# Patient Record
Sex: Female | Born: 1940 | Race: White | Hispanic: No | Marital: Married | Smoking: Former smoker
Health system: Southern US, Community
[De-identification: ages and names within clinical notes are randomized; demographics above are authoritative.]

## PROBLEM LIST (undated history)

## (undated) DIAGNOSIS — M1990 Unspecified osteoarthritis, unspecified site: Secondary | ICD-10-CM

## (undated) DIAGNOSIS — D126 Benign neoplasm of colon, unspecified: Secondary | ICD-10-CM

## (undated) DIAGNOSIS — K5792 Diverticulitis of intestine, part unspecified, without perforation or abscess without bleeding: Secondary | ICD-10-CM

## (undated) DIAGNOSIS — D649 Anemia, unspecified: Secondary | ICD-10-CM

## (undated) DIAGNOSIS — Z9981 Dependence on supplemental oxygen: Secondary | ICD-10-CM

## (undated) DIAGNOSIS — G4730 Sleep apnea, unspecified: Secondary | ICD-10-CM

## (undated) DIAGNOSIS — R0602 Shortness of breath: Secondary | ICD-10-CM

## (undated) DIAGNOSIS — J449 Chronic obstructive pulmonary disease, unspecified: Secondary | ICD-10-CM

## (undated) DIAGNOSIS — E669 Obesity, unspecified: Secondary | ICD-10-CM

## (undated) DIAGNOSIS — I509 Heart failure, unspecified: Secondary | ICD-10-CM

## (undated) DIAGNOSIS — I10 Essential (primary) hypertension: Secondary | ICD-10-CM

## (undated) DIAGNOSIS — E079 Disorder of thyroid, unspecified: Secondary | ICD-10-CM

## (undated) DIAGNOSIS — A4902 Methicillin resistant Staphylococcus aureus infection, unspecified site: Secondary | ICD-10-CM

## (undated) DIAGNOSIS — L03119 Cellulitis of unspecified part of limb: Secondary | ICD-10-CM

## (undated) DIAGNOSIS — J40 Bronchitis, not specified as acute or chronic: Secondary | ICD-10-CM

## (undated) DIAGNOSIS — K8020 Calculus of gallbladder without cholecystitis without obstruction: Secondary | ICD-10-CM

## (undated) HISTORY — DX: Obesity, unspecified: E66.9

## (undated) HISTORY — DX: Cellulitis of unspecified part of limb: L03.119

## (undated) HISTORY — DX: Methicillin resistant Staphylococcus aureus infection, unspecified site: A49.02

## (undated) HISTORY — DX: Disorder of thyroid, unspecified: E07.9

## (undated) HISTORY — DX: Anemia, unspecified: D64.9

## (undated) HISTORY — DX: Bronchitis, not specified as acute or chronic: J40

## (undated) HISTORY — PX: OTHER SURGICAL HISTORY: SHX169

## (undated) HISTORY — DX: Benign neoplasm of colon, unspecified: D12.6

## (undated) HISTORY — PX: APPLICATION OF WOUND VAC: SHX5189

## (undated) HISTORY — DX: Calculus of gallbladder without cholecystitis without obstruction: K80.20

## (undated) HISTORY — PX: APPENDECTOMY: SHX54

## (undated) HISTORY — PX: BACK SURGERY: SHX140

## (undated) HISTORY — DX: Chronic obstructive pulmonary disease, unspecified: J44.9

## (undated) HISTORY — DX: Diverticulitis of intestine, part unspecified, without perforation or abscess without bleeding: K57.92

---

## 1976-09-24 HISTORY — PX: ABDOMINAL HYSTERECTOMY: SHX81

## 1977-09-24 HISTORY — PX: ABDOMINAL HYSTERECTOMY: SHX81

## 2000-05-23 ENCOUNTER — Encounter (INDEPENDENT_AMBULATORY_CARE_PROVIDER_SITE_OTHER): Payer: Self-pay | Admitting: Specialist

## 2000-05-23 ENCOUNTER — Other Ambulatory Visit: Admission: RE | Admit: 2000-05-23 | Discharge: 2000-05-23 | Payer: Self-pay | Admitting: Gastroenterology

## 2002-11-20 ENCOUNTER — Encounter: Payer: Self-pay | Admitting: Family Medicine

## 2002-11-20 ENCOUNTER — Ambulatory Visit (HOSPITAL_COMMUNITY): Admission: RE | Admit: 2002-11-20 | Discharge: 2002-11-20 | Payer: Self-pay | Admitting: Family Medicine

## 2003-01-12 ENCOUNTER — Ambulatory Visit: Admission: RE | Admit: 2003-01-12 | Discharge: 2003-01-12 | Payer: Self-pay | Admitting: Pulmonary Disease

## 2003-02-08 ENCOUNTER — Encounter: Payer: Self-pay | Admitting: Family Medicine

## 2003-02-08 ENCOUNTER — Ambulatory Visit (HOSPITAL_COMMUNITY): Admission: RE | Admit: 2003-02-08 | Discharge: 2003-02-08 | Payer: Self-pay | Admitting: Family Medicine

## 2003-02-26 ENCOUNTER — Ambulatory Visit: Admission: RE | Admit: 2003-02-26 | Discharge: 2003-02-26 | Payer: Self-pay | Admitting: Pulmonary Disease

## 2003-06-22 ENCOUNTER — Ambulatory Visit (HOSPITAL_COMMUNITY): Admission: RE | Admit: 2003-06-22 | Discharge: 2003-06-22 | Payer: Self-pay | Admitting: Family Medicine

## 2003-06-22 ENCOUNTER — Encounter: Payer: Self-pay | Admitting: Family Medicine

## 2003-09-28 ENCOUNTER — Inpatient Hospital Stay (HOSPITAL_COMMUNITY): Admission: EM | Admit: 2003-09-28 | Discharge: 2003-10-09 | Payer: Self-pay | Admitting: Emergency Medicine

## 2003-10-18 ENCOUNTER — Ambulatory Visit (HOSPITAL_COMMUNITY): Admission: RE | Admit: 2003-10-18 | Discharge: 2003-10-18 | Payer: Self-pay | Admitting: Pulmonary Disease

## 2004-08-28 ENCOUNTER — Ambulatory Visit: Payer: Self-pay | Admitting: Gastroenterology

## 2004-09-13 ENCOUNTER — Ambulatory Visit: Payer: Self-pay | Admitting: Gastroenterology

## 2005-01-08 ENCOUNTER — Ambulatory Visit (HOSPITAL_COMMUNITY): Admission: RE | Admit: 2005-01-08 | Discharge: 2005-01-08 | Payer: Self-pay | Admitting: Internal Medicine

## 2005-08-31 ENCOUNTER — Ambulatory Visit (HOSPITAL_COMMUNITY): Admission: RE | Admit: 2005-08-31 | Discharge: 2005-08-31 | Payer: Self-pay | Admitting: Family Medicine

## 2007-04-02 ENCOUNTER — Ambulatory Visit (HOSPITAL_COMMUNITY): Admission: RE | Admit: 2007-04-02 | Discharge: 2007-04-02 | Payer: Self-pay | Admitting: Family Medicine

## 2007-04-11 ENCOUNTER — Ambulatory Visit (HOSPITAL_COMMUNITY): Admission: RE | Admit: 2007-04-11 | Discharge: 2007-04-11 | Payer: Self-pay | Admitting: Family Medicine

## 2007-11-04 ENCOUNTER — Ambulatory Visit (HOSPITAL_COMMUNITY): Admission: RE | Admit: 2007-11-04 | Discharge: 2007-11-04 | Payer: Self-pay | Admitting: Family Medicine

## 2008-03-04 ENCOUNTER — Encounter: Payer: Self-pay | Admitting: Pulmonary Disease

## 2008-03-10 DIAGNOSIS — G4733 Obstructive sleep apnea (adult) (pediatric): Secondary | ICD-10-CM

## 2008-10-13 ENCOUNTER — Encounter (INDEPENDENT_AMBULATORY_CARE_PROVIDER_SITE_OTHER): Payer: Self-pay | Admitting: Orthopedic Surgery

## 2008-10-13 ENCOUNTER — Ambulatory Visit (HOSPITAL_BASED_OUTPATIENT_CLINIC_OR_DEPARTMENT_OTHER): Admission: RE | Admit: 2008-10-13 | Discharge: 2008-10-13 | Payer: Self-pay | Admitting: Orthopedic Surgery

## 2009-05-09 ENCOUNTER — Telehealth: Payer: Self-pay | Admitting: Gastroenterology

## 2009-07-15 ENCOUNTER — Encounter (INDEPENDENT_AMBULATORY_CARE_PROVIDER_SITE_OTHER): Payer: Self-pay | Admitting: *Deleted

## 2009-08-23 ENCOUNTER — Encounter (INDEPENDENT_AMBULATORY_CARE_PROVIDER_SITE_OTHER): Payer: Self-pay

## 2009-08-24 ENCOUNTER — Encounter (INDEPENDENT_AMBULATORY_CARE_PROVIDER_SITE_OTHER): Payer: Self-pay

## 2009-08-24 ENCOUNTER — Ambulatory Visit: Payer: Self-pay | Admitting: Gastroenterology

## 2009-09-06 ENCOUNTER — Telehealth: Payer: Self-pay | Admitting: Gastroenterology

## 2009-09-07 ENCOUNTER — Ambulatory Visit: Payer: Self-pay | Admitting: Gastroenterology

## 2009-09-09 ENCOUNTER — Encounter: Payer: Self-pay | Admitting: Gastroenterology

## 2009-09-14 ENCOUNTER — Telehealth (INDEPENDENT_AMBULATORY_CARE_PROVIDER_SITE_OTHER): Payer: Self-pay | Admitting: *Deleted

## 2010-01-23 ENCOUNTER — Ambulatory Visit (HOSPITAL_COMMUNITY): Admission: RE | Admit: 2010-01-23 | Discharge: 2010-01-23 | Payer: Self-pay | Admitting: Internal Medicine

## 2010-01-30 ENCOUNTER — Ambulatory Visit (HOSPITAL_COMMUNITY): Admission: RE | Admit: 2010-01-30 | Discharge: 2010-01-30 | Payer: Self-pay | Admitting: Internal Medicine

## 2010-12-12 LAB — CREATININE, SERUM: GFR calc Af Amer: >60 mL/min (ref >60)

## 2010-12-26 LAB — GLUCOSE, CAPILLARY: Glucose-Capillary: 112 mg/dL — ABNORMAL HIGH (ref 70–99)

## 2011-01-08 LAB — BASIC METABOLIC PANEL
BUN: 15 mg/dL (ref 6–23)
Chloride: 101 mEq/L (ref 96–112)
GFR calc non Af Amer: >60 mL/min (ref >60)
Potassium: 3.9 mEq/L (ref 3.5–5.1)
Sodium: 135 mEq/L (ref 135–145)

## 2011-01-08 LAB — POCT HEMOGLOBIN-HEMACUE: Hemoglobin: 14.7 g/dL (ref 12.0–15.0)

## 2011-02-06 NOTE — Op Note (Signed)
[name], [name]                  ACCOUNT NO.:  [id number]   MEDICAL RECORD NO.:  [id number]          PATIENT TYPE:  AMB   LOCATION:  DSC                          FACILITY:  [facility]   PHYSICIAN:  Cindee Salt, M.D.       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   OPERATION:  Excision mucoid cyst, debridement of distal interphalangeal  joint, rotation of dorsal flap for closure of right little finger.   SURGEON:  Cindee Salt, MD   ASSISTANT:  Carolyne Fiscal R.N.   ANESTHESIA:  Forearm based IV regional.   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  She is desirous of having this excised.  She is aware of risks and  complications including infection, recurrence injury to arteries,  nerves, tendons, incomplete relief of symptoms, dystrophy, and the  possibility of recurrence as long as the joint is still present with  degenerative changes.  She has elected to proceed to have this done.  In  the preoperative area, the patient is seen.  The extremity marked by  both the patient and surgeon.  Antibiotic given.   PROCEDURE:  The patient is brought to the operating room where forearm  based IV regional anesthetic was carried out without difficulty.  She  was prepped using DuraPrep, supine position, right arm free.  A time-out  was taken confirming the patient and surgery.  A curvilinear incision  was then made over the dorsum of the finger and carried down through the  subcutaneous tissue.  The cyst was immediately  encountered.  This was  found to be densely adhered to the skin and with an area of translucency  on its most superficial aspect.  This was excised in total along with  the skin.  The specimen was sent to pathology.  The joint was then  opened on its ulnar aspect.  A debridement performed with a small  rongeur.  This was copiously irrigated with saline.  The incision was  then made proximally curving around the proximal interphalangeal joint  back cut.  This allowed the skin to be elevated to the periosteum and  depression  to the paratenon and rotated distally.  This was, after  irrigation, sutured into position with interrupted 5-0 Vicryl Rapide  sutures.  A sterile compressive dressing and splint was applied to the  finger.  Bleeders were electrocauterized bipolar during the procedure.  The  patient tolerated the procedure well and was taken to the recovery room  for observation in satisfactory condition.  She will be discharged home  to return to the [facility] in 1 week on [place].  ______________________________  Cindee Salt, M.D.     GK/MEDQ  D:  10/13/2008  T:  10/14/2008  Job:  [id number]   cc:   Patrica Duel, M.D.

## 2011-02-09 NOTE — Op Note (Signed)
NAME:  [name], [name]                            ACCOUNT NO.:  [id number]   MEDICAL RECORD NO.:  [id number]                   PATIENT TYPE:  INP   LOCATION:  5733                                 FACILITY:  [facility]   PHYSICIAN:  Erasmo Leventhal, M.D.         DATE OF BIRTH:  1941-09-16   DATE OF PROCEDURE:  09/29/2003  DATE OF DISCHARGE:                                 OPERATIVE REPORT   PREOPERATIVE DIAGNOSES:  Right knee comminuted, displaced, depressed lateral  tibial plateau fracture, Schatzker type III fracture, associated fibular  neck fracture.   POSTOPERATIVE DIAGNOSES:  Right knee comminuted, displaced, depressed  lateral tibial plateau fracture, Schatzker type III fracture, associated  fibular neck fracture.  Lateral meniscocapsular avulsion.   PROCEDURE:  1. Open reduction and internal fixation of right lateral tibial plateau     fracture.  2. Supplemental bone grafting, utilization of Norian.  3. Intraoperative utilization of C-arm.  4. Lateral meniscal repair.   SURGEON:  Erasmo Leventhal, M.D.   ASSISTANT:  Jaquelyn Bitter. Chabon, P.A.   ANESTHESIA:  General.   ESTIMATED BLOOD LOSS:  Less than 50 mL.   DRAINS:  One Hemovac.   COMPLICATIONS:  None.   TOURNIQUET TIME:  1 hour 45 minutes at 375 mmHg.   COMPLICATIONS:  None.   DISPOSITION:  To PACU in stable condition.   DESCRIPTION OF PROCEDURE:  The patient was counseled in the holding area and  the correct side was identified. She preoperatively received by medicine and  they cleared her for surgery.  She was taken to the operating room and  placed in the supine position under general anesthesia.  Knee immobilizer  was removed and the knee was examined.  She was unstable to valgus stress  and full extension.  Mild tenderness medially.  Medial caudal ligament was  blocked off and checked throughout the case.  She was elevated, prepped with  DuraPrep, and all was draped in a sterile fashion, exsanguinated  with  Esmarch tourniquet and was inflated to 375 mmHg.   A standard lateral approach was made through the skin and subcutaneous  tissue; a slightly oblique incision to the lateral parapatellar region.  Dissection was carried through the skin and subcutaneous tissue.  The  hematoma was encountered and had dissected the soft tissue planes.  At this  time utilizing sharp dissection, a lateral arthrotomy was performed through  the retinaculum capsule going down to the fat pad, part of it was resected.  The lateral meniscus was identified and was protected.  It had actually torn  from the meniscocapsular junction. Subperiosteal dissection undertaken from  the lateral aspect of the tibia.  The common peroneal nerve was thought  protected throughout the case behind the fibular neck. The fracture was  opened.  It was found to be markedly comminuted and depressed with central  depression of nonusable articular cartilage.  This was debrided.  Hematoma  was removed.  Utilizing bone tamp, the depressed area was then elevated.  The lateral wall was then brought over and reduced, found to have a  satisfactory reduction of the lateral cortex and also the articular surface  at this time.  An appropriate Synthes lateral tibial plateau plate was  chosen, provisionally fixed proximally and then tacked distally.  There was  a little bit of a step off distally, however, I felt that this was  satisfactory due to the fact that we were using a locking plate.  Transverse  locking screw was made and then oblique screw.  We also proximally had  placed the bullet guides and guide pins had been placed through this.  Depths were measured and locking screws were placed into the far cortex, but  not through it and locked into the plate proximally.  C-arm was then  utilized to confirm excellent reduction of the fracture and placement of  implants.  We did exchange a distal screw.  A small opening was made just  anterior.   Curet was placed into this.  The metaphyseal void was identified  and utilizing the Norian bone minimization substitute, this was then placed  in a fashion filling posterior to anterior.  Prior to this, we irrigated the  knee joint and soft tissues copiously.  The meniscus was now repaired with  an Ethibond suture back to meniscal capsular junction. The lateral  arthrotomy was closed with pledgeted Vicryl. A medium Hemovac drain was  placed.  I also had performed an anterior compartment fascial release  bluntly with my finger prior to closure.  The subcu closed with Vicryl, skin  closed with staples.  Sterile compressive dressing was applied.  Drain was  then hooked to suction.  Normal circulation in the foot and ankle at the end  of the case.  Placed in knee immobilizer.  Given another gram of Ancef  intravenously postoperatively.  Needle, sponge, and instrument counts  correct.  There were no complications.  He was gently awakened in the  operating room and taken in stable condition.   To decrease surgical time and help throughout this entire procedure, Jaquelyn Bitter. Chabon, P.A.'s assistance was needed.                                               Erasmo Leventhal, M.D.    RAC/MEDQ  D:  09/29/2003  T:  09/30/2003  Job:  [id number]
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
## 2011-02-09 NOTE — Discharge Summary (Signed)
NAME:  [name], [name]                            ACCOUNT NO.:  [id number]   MEDICAL RECORD NO.:  [id number]                   PATIENT TYPE:  INP   LOCATION:  5011                                 FACILITY:  [facility]   PHYSICIAN:  Erasmo Leventhal, M.D.         DATE OF BIRTH:  11/26/1940   DATE OF ADMISSION:  09/28/2003  DATE OF DISCHARGE:  10/09/2003                                 DISCHARGE SUMMARY   ADMITTING DIAGNOSIS:  Right knee lateral tibial plateau fracture.   DISCHARGE DIAGNOSIS:  Right knee lateral tibial plateau fracture.   OPERATION:  Open reduction internal fixation tibial plateau fracture.   BRIEF HISTORY:  This is a 70 year old lady with a history of a fall the p.m.  of admission with pain in the knee.  She was seen in [facility] emergency room  where she had a displaced depressed tibial plateau fracture.  After  discussion of treatment options, the patient is now scheduled for open  reduction internal fixation.  The surgery, risks, benefits and aftercare was  discussed in detail with the patient, questions were provided and answered.   LABORATORY VALUES:  Admission CBC showed the WBC to be high at 12.9.  Throughout her admission the WBC remained  low grade elevated at 15, the  last date of her CBC, it was still high at 12.8, otherwise within normal  limits with the exception of slightly high platelets of 46.  PT and PTT on  admission were within normal limits by discharge.  Her PT was 23.4 with an  INR of 2.9.  Her admission CMET showed glucose high at 101, albumin low at  3.4, otherwise within normal limits.  She was slightly hyponatremic on  January 7 at 133.  Her glucose remained elevated throughout the admission.  Calcium became low with a low of 7.6 on January 7.  It was back up to 8.0 on  January 8.  Urinalysis showed protein of 30, otherwise within normal limits.  Urine culture showed no growth.   COURSE IN THE HOSPITAL:  The patient tolerated the operative  procedure well.  Medical service consult was obtained to mange her hypertension and medical  issues.  She also had a history of sleep apnea.  On the first postoperative  day, she was feeling pretty good.  Vital signs were stable.  Temperature was  101.  The drain was removed intact.  Neurovascular status was intact to  foot.  Negative calf tenderness.  Negative compartment signs.  Lungs were  clear.  She was started in CPM and started on Coumadin protocol as a DVT  prophylaxis.  On the second postoperative day, blood sugar remained stable,  temp to 100.8 maximum.  Dressing was dry.  Neurovascular status was okay.  Calves were soft. The patient had had some decreased O2 SAT, and had a chest  CT, which was negative for pulmonary embolus, but positive for atelectasis  and  had increase in her incentive spirometry.  On the third postoperative  day, the vital signs remained stable, temp was  down to 99.9.  Her wounds  were benign.  Her cath was negative.  Compartments were soft. Her IV was  switched to a Heparin well.  On the fourth postoperative day, the patient  continued to do well.  Her vital signs were stable.  Blood pressure was  stable.  Internal medicine service signed off.  Her wound was benign.  Calves were negative, and she was scheduled for transfer to rehab if a bed  was available.  On the fifth postoperative day, vital signs remained stable.  She was afebrile.  Calf was negative.  The wound was benign.  Her blood  pressure was starting to elevate again, and her blood pressure medications  were restarted at a lower dose.  On the sixth postoperative day, vital signs  remained stable, she was afebrile.  She was increasing her therapy and  improving in terms of her ambulatory ability.  On the seventh postoperative  day, she had a rash on her back, this was not just in a heat rash  distribution, her vital signs were stable.  She was afebrile.  Her dressing  was changed. Her wound was  benign.  Her Percocet was discontinued. She was  tried on Benadryl 25 q.6 and a dermatology consult was obtained.  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  On the eighth postoperative day, she was having hot flashes. She had been on  an HRT patch prior to admission, but had not told us that at that point, and  so this was ordered for the patient.  Otherwise, her wounds remained benign,  vital signs stable and she was afebrile.  She had up and around in her room  in therapy, but had not been ambulating outside or in the hall and we  notified therapy to increase her ambulation status.  This was to be with  touch-down weight bearing only.  Postoperative day number nine, vital signs  were stable, she was afebrile. INR was therapeutic.  Dressing was changed.  Her wound was benign.  Her rash was better.  Calves were negative.  Her pain  medicine was switched to Vicodin. On the tenth postoperative day, she was  fairly comfortable, vital signs were stable, the wound was okay, calves were  negative.  She was not able to be cleared to rehab, and rehab could not  admit her.  I had a long discussion with the patient and her husband about  the need to work hard in therapy today as she was just going to have to go  home and work with home rehabilitation.  They understood this. She worked  hard in rehab and the following day, her vital signs were stable, she was  afebrile, x-rays were negative.  Temperature was back down into the normal  range.  Incision was clean and healing well without side of infection, and  her calf was negative, and she was subsequently discharged home.   CONDITION ON DISCHARGE:  Improved.   DISCHARGE MEDICATIONS:  Vicodin 1 to 2 q.6 hours p.r.n. pain.  Robaxin 500  one p.o. q.8 hours. Resume home medicines and Coumadin per pharmacy  protocol.  She will  return to the office in seven to ten days for recheck and call us  sooner p.r.n. problems.  She is to remain touch-down weight bearing only.  Questions were invited and answered.      Jaquelyn Bitter. Chabon, P.A.                   Erasmo Leventhal, M.D.    SJC/MEDQ  D:  12/10/2003  T:  12/13/2003  Job:  [id number]

## 2011-10-09 ENCOUNTER — Emergency Department (HOSPITAL_COMMUNITY): Payer: Medicare Other

## 2011-10-09 ENCOUNTER — Inpatient Hospital Stay (HOSPITAL_COMMUNITY): Payer: Medicare Other | Admitting: Certified Registered"

## 2011-10-09 ENCOUNTER — Encounter (HOSPITAL_COMMUNITY): Payer: Self-pay | Admitting: Certified Registered"

## 2011-10-09 ENCOUNTER — Other Ambulatory Visit: Payer: Self-pay

## 2011-10-09 ENCOUNTER — Inpatient Hospital Stay (HOSPITAL_COMMUNITY): Payer: Medicare Other

## 2011-10-09 ENCOUNTER — Inpatient Hospital Stay (HOSPITAL_COMMUNITY)
Admission: EM | Admit: 2011-10-09 | Discharge: 2011-10-24 | DRG: 329 | Disposition: A | Discharge status: Skilled Nursing Facility | Payer: Medicare Other | Source: Ambulatory Visit | Attending: General Surgery | Admitting: General Surgery

## 2011-10-09 ENCOUNTER — Other Ambulatory Visit (INDEPENDENT_AMBULATORY_CARE_PROVIDER_SITE_OTHER): Payer: Self-pay | Admitting: General Surgery

## 2011-10-09 ENCOUNTER — Encounter (HOSPITAL_COMMUNITY): Payer: Self-pay | Admitting: Emergency Medicine

## 2011-10-09 ENCOUNTER — Encounter (HOSPITAL_COMMUNITY): Admission: EM | Disposition: A | Discharge status: Skilled Nursing Facility | Payer: Self-pay | Source: Ambulatory Visit

## 2011-10-09 DIAGNOSIS — I509 Heart failure, unspecified: Secondary | ICD-10-CM

## 2011-10-09 DIAGNOSIS — K861 Other chronic pancreatitis: Secondary | ICD-10-CM | POA: Diagnosis present

## 2011-10-09 DIAGNOSIS — E46 Unspecified protein-calorie malnutrition: Secondary | ICD-10-CM | POA: Diagnosis present

## 2011-10-09 DIAGNOSIS — K668 Other specified disorders of peritoneum: Secondary | ICD-10-CM | POA: Diagnosis present

## 2011-10-09 DIAGNOSIS — Z79899 Other long term (current) drug therapy: Secondary | ICD-10-CM

## 2011-10-09 DIAGNOSIS — I10 Essential (primary) hypertension: Secondary | ICD-10-CM | POA: Diagnosis present

## 2011-10-09 DIAGNOSIS — M129 Arthropathy, unspecified: Secondary | ICD-10-CM | POA: Diagnosis present

## 2011-10-09 DIAGNOSIS — K5730 Diverticulosis of large intestine without perforation or abscess without bleeding: Secondary | ICD-10-CM

## 2011-10-09 DIAGNOSIS — K659 Peritonitis, unspecified: Secondary | ICD-10-CM

## 2011-10-09 DIAGNOSIS — G4733 Obstructive sleep apnea (adult) (pediatric): Secondary | ICD-10-CM | POA: Diagnosis present

## 2011-10-09 DIAGNOSIS — K5720 Diverticulitis of large intestine with perforation and abscess without bleeding: Secondary | ICD-10-CM | POA: Diagnosis present

## 2011-10-09 DIAGNOSIS — K5732 Diverticulitis of large intestine without perforation or abscess without bleeding: Principal | ICD-10-CM | POA: Diagnosis present

## 2011-10-09 DIAGNOSIS — K630 Abscess of intestine: Secondary | ICD-10-CM | POA: Diagnosis present

## 2011-10-09 DIAGNOSIS — D72829 Elevated white blood cell count, unspecified: Secondary | ICD-10-CM | POA: Diagnosis present

## 2011-10-09 DIAGNOSIS — E119 Type 2 diabetes mellitus without complications: Secondary | ICD-10-CM | POA: Diagnosis present

## 2011-10-09 DIAGNOSIS — Z6841 Body Mass Index (BMI) 40.0 and over, adult: Secondary | ICD-10-CM

## 2011-10-09 DIAGNOSIS — K631 Perforation of intestine (nontraumatic): Secondary | ICD-10-CM

## 2011-10-09 DIAGNOSIS — E871 Hypo-osmolality and hyponatremia: Secondary | ICD-10-CM | POA: Diagnosis present

## 2011-10-09 DIAGNOSIS — J9600 Acute respiratory failure, unspecified whether with hypoxia or hypercapnia: Secondary | ICD-10-CM

## 2011-10-09 DIAGNOSIS — K5900 Constipation, unspecified: Secondary | ICD-10-CM | POA: Diagnosis present

## 2011-10-09 DIAGNOSIS — D649 Anemia, unspecified: Secondary | ICD-10-CM | POA: Diagnosis present

## 2011-10-09 DIAGNOSIS — Z7982 Long term (current) use of aspirin: Secondary | ICD-10-CM

## 2011-10-09 DIAGNOSIS — E66813 Obesity, class 3: Secondary | ICD-10-CM | POA: Diagnosis present

## 2011-10-09 DIAGNOSIS — A419 Sepsis, unspecified organism: Secondary | ICD-10-CM | POA: Diagnosis not present

## 2011-10-09 HISTORY — DX: Unspecified osteoarthritis, unspecified site: M19.90

## 2011-10-09 HISTORY — DX: Essential (primary) hypertension: I10

## 2011-10-09 HISTORY — PX: COLON RESECTION: SHX5231

## 2011-10-09 LAB — URINALYSIS, ROUTINE W REFLEX MICROSCOPIC
Bilirubin Urine: NEGATIVE
Leukocytes, UA: NEGATIVE
Nitrite: NEGATIVE
Specific Gravity, Urine: <1.005 — ABNORMAL LOW (ref 1.005–1.030)
Urobilinogen, UA: 0.2 mg/dL (ref 0.0–1.0)
pH: 7 (ref 5.0–8.0)

## 2011-10-09 LAB — URINE CULTURE: Culture  Setup Time: 201301151933

## 2011-10-09 LAB — CBC
HCT: 36 % (ref 36.0–46.0)
MCH: 24.4 pg — ABNORMAL LOW (ref 26.0–34.0)
MCHC: 31.3 g/dL (ref 30.0–36.0)
MCV: 76.4 fL — ABNORMAL LOW (ref 78.0–100.0)
Platelets: 335 10*3/uL (ref 150–400)
Platelets: 289 10*3/uL (ref 150–400)
RBC: 4.71 MIL/uL (ref 3.87–5.11)
WBC: 27.4 10*3/uL — ABNORMAL HIGH (ref 4.0–10.5)

## 2011-10-09 LAB — DIFFERENTIAL
Eosinophils Relative: 0 % (ref 0–5)
Lymphocytes Relative: 8 % — ABNORMAL LOW (ref 12–46)
Lymphs Abs: 2.1 10*3/uL (ref 0.7–4.0)
Monocytes Absolute: 1.7 10*3/uL — ABNORMAL HIGH (ref 0.1–1.0)
Neutro Abs: 23.6 10*3/uL — ABNORMAL HIGH (ref 1.7–7.7)

## 2011-10-09 LAB — SAMPLE TO BLOOD BANK

## 2011-10-09 LAB — BASIC METABOLIC PANEL
CO2: 26 mEq/L (ref 19–32)
Calcium: 9 mg/dL (ref 8.4–10.5)
Chloride: 97 mEq/L (ref 96–112)
Glucose, Bld: 168 mg/dL — ABNORMAL HIGH (ref 70–99)
Sodium: 131 mEq/L — ABNORMAL LOW (ref 135–145)

## 2011-10-09 LAB — POCT I-STAT 3, ART BLOOD GAS (G3+)
Bicarbonate: 22.8 mEq/L (ref 20.0–24.0)
O2 Saturation: 98 %
Patient temperature: 98.9
TCO2: 24 mmol/L (ref 0–100)

## 2011-10-09 LAB — HEMOGLOBIN A1C
Hgb A1c MFr Bld: 6.7 % — ABNORMAL HIGH (ref <5.7)
Mean Plasma Glucose: 146 mg/dL — ABNORMAL HIGH (ref <117)

## 2011-10-09 LAB — CULTURE, BLOOD (ROUTINE X 2)

## 2011-10-09 LAB — URINE MICROSCOPIC-ADD ON

## 2011-10-09 LAB — GLUCOSE, CAPILLARY: Glucose-Capillary: 144 mg/dL — ABNORMAL HIGH (ref 70–99)

## 2011-10-09 SURGERY — COLON RESECTION
Anesthesia: General | Site: Abdomen | Wound class: Clean Contaminated

## 2011-10-09 MED ORDER — SODIUM CHLORIDE 0.9 % IV BOLUS (SEPSIS)
1000.0000 mL | Freq: Once | INTRAVENOUS | Status: AC
  Administered 2011-10-09: 1000 mL via INTRAVENOUS

## 2011-10-09 MED ORDER — PROPOFOL 10 MG/ML IV EMUL
INTRAVENOUS | Status: DC | PRN
  Administered 2011-10-09: 160 mg via INTRAVENOUS

## 2011-10-09 MED ORDER — ONDANSETRON HCL 4 MG/2ML IJ SOLN
4.0000 mg | Freq: Four times a day (QID) | INTRAMUSCULAR | Status: DC | PRN
  Administered 2011-10-12 – 2011-10-15 (×2): 4 mg via INTRAVENOUS
  Filled 2011-10-09 (×2): qty 2

## 2011-10-09 MED ORDER — ACETAMINOPHEN 325 MG PO TABS: 650.0000 mg | ORAL_TABLET | Freq: Four times a day (QID) | ORAL | Status: DC | PRN

## 2011-10-09 MED ORDER — PANTOPRAZOLE SODIUM 40 MG IV SOLR
40.0000 mg | Freq: Every day | INTRAVENOUS | Status: DC
  Administered 2011-10-10 – 2011-10-16 (×8): 40 mg via INTRAVENOUS
  Filled 2011-10-09 (×10): qty 40

## 2011-10-09 MED ORDER — SODIUM CHLORIDE 0.9 % IV SOLN
0.2000 ug/kg/h | INTRAVENOUS | Status: DC
  Administered 2011-10-09: 1 ug/kg/h via INTRAVENOUS
  Filled 2011-10-09: qty 2

## 2011-10-09 MED ORDER — LACTATED RINGERS IV SOLN
INTRAVENOUS | Status: DC
  Administered 2011-10-09: 14:00:00 via INTRAVENOUS

## 2011-10-09 MED ORDER — INSULIN ASPART 100 UNIT/ML ~~LOC~~ SOLN: 0.0000 [IU] | Freq: Three times a day (TID) | SUBCUTANEOUS | Status: DC

## 2011-10-09 MED ORDER — SODIUM CHLORIDE 0.9 % IV SOLN
INTRAVENOUS | Status: DC
  Administered 2011-10-09: 20:00:00 via INTRAVENOUS
  Administered 2011-10-10: 75 mL via INTRAVENOUS
  Administered 2011-10-11: 75 mL/h via INTRAVENOUS
  Administered 2011-10-11 – 2011-10-13 (×3): via INTRAVENOUS
  Administered 2011-10-15: 1000 mL via INTRAVENOUS

## 2011-10-09 MED ORDER — HEPARIN SODIUM (PORCINE) 5000 UNIT/ML IJ SOLN
5000.0000 [IU] | Freq: Three times a day (TID) | INTRAMUSCULAR | Status: DC
Start: 2011-10-10 — End: 2011-10-24
  Administered 2011-10-10 – 2011-10-24 (×41): 5000 [IU] via SUBCUTANEOUS
  Filled 2011-10-09 (×45): qty 1

## 2011-10-09 MED ORDER — SODIUM CHLORIDE 0.9 % IV SOLN
0.4000 ug/kg/h | INTRAVENOUS | Status: DC
  Filled 2011-10-09: qty 2

## 2011-10-09 MED ORDER — HETASTARCH-ELECTROLYTES 6 % IV SOLN
INTRAVENOUS | Status: DC | PRN
  Administered 2011-10-09: 19:00:00 via INTRAVENOUS

## 2011-10-09 MED ORDER — MIDAZOLAM HCL 2 MG/2ML IJ SOLN
INTRAMUSCULAR | Status: AC
  Administered 2011-10-09: 2 mg via INTRAVENOUS
  Filled 2011-10-09: qty 2

## 2011-10-09 MED ORDER — SUCCINYLCHOLINE CHLORIDE 20 MG/ML IJ SOLN
INTRAMUSCULAR | Status: DC | PRN
  Administered 2011-10-09: 100 mg via INTRAVENOUS

## 2011-10-09 MED ORDER — ASPIRIN 300 MG RE SUPP
300.0000 mg | RECTAL | Status: AC
  Administered 2011-10-09: 300 mg via RECTAL
  Filled 2011-10-09: qty 1

## 2011-10-09 MED ORDER — MIDAZOLAM HCL 2 MG/2ML IJ SOLN
1.0000 mg | INTRAMUSCULAR | Status: DC | PRN
  Administered 2011-10-09 – 2011-10-10 (×2): 2 mg via INTRAVENOUS
  Filled 2011-10-09: qty 2

## 2011-10-09 MED ORDER — INSULIN ASPART 100 UNIT/ML ~~LOC~~ SOLN
0.0000 [IU] | Freq: Three times a day (TID) | SUBCUTANEOUS | Status: DC
  Filled 2011-10-09: qty 3

## 2011-10-09 MED ORDER — SODIUM CHLORIDE 0.9 % IV SOLN
INTRAVENOUS | Status: DC | PRN
  Administered 2011-10-09 (×2): via INTRAVENOUS

## 2011-10-09 MED ORDER — LACTATED RINGERS IV SOLN
INTRAVENOUS | Status: DC | PRN
  Administered 2011-10-09 (×2): via INTRAVENOUS

## 2011-10-09 MED ORDER — ONDANSETRON HCL 4 MG/2ML IJ SOLN
4.0000 mg | Freq: Once | INTRAMUSCULAR | Status: AC
  Administered 2011-10-09: 4 mg via INTRAVENOUS
  Filled 2011-10-09: qty 2

## 2011-10-09 MED ORDER — PANTOPRAZOLE SODIUM 40 MG IV SOLR: 40.0000 mg | Freq: Every day | INTRAVENOUS | Status: DC

## 2011-10-09 MED ORDER — HEPARIN SODIUM (PORCINE) 5000 UNIT/ML IJ SOLN: 5000.0000 [IU] | Freq: Three times a day (TID) | INTRAMUSCULAR | Status: DC

## 2011-10-09 MED ORDER — FENTANYL CITRATE 0.05 MG/ML IJ SOLN
INTRAMUSCULAR | Status: DC | PRN
  Administered 2011-10-09: 100 ug via INTRAVENOUS
  Administered 2011-10-09 (×2): 50 ug via INTRAVENOUS
  Administered 2011-10-09 (×4): 100 ug via INTRAVENOUS

## 2011-10-09 MED ORDER — POTASSIUM CHLORIDE IN NACL 20-0.45 MEQ/L-% IV SOLN: INTRAVENOUS | Status: DC

## 2011-10-09 MED ORDER — INSULIN ASPART 100 UNIT/ML ~~LOC~~ SOLN: 3.0000 [IU] | SUBCUTANEOUS | Status: DC

## 2011-10-09 MED ORDER — FENTANYL CITRATE 0.05 MG/ML IJ SOLN
50.0000 ug | Freq: Once | INTRAMUSCULAR | Status: AC
  Administered 2011-10-09: 50 ug via INTRAVENOUS
  Filled 2011-10-09: qty 2

## 2011-10-09 MED ORDER — INSULIN ASPART 100 UNIT/ML ~~LOC~~ SOLN
0.0000 [IU] | SUBCUTANEOUS | Status: DC
  Administered 2011-10-10 – 2011-10-14 (×12): 1 [IU] via SUBCUTANEOUS
  Filled 2011-10-09: qty 3

## 2011-10-09 MED ORDER — SODIUM CHLORIDE 0.9 % IJ SOLN
10.0000 mL | INTRAMUSCULAR | Status: DC | PRN
  Administered 2011-10-16 (×2): 10 mL

## 2011-10-09 MED ORDER — MIDAZOLAM HCL 2 MG/2ML IJ SOLN: 2.0000 mg | INTRAMUSCULAR | Status: DC | PRN

## 2011-10-09 MED ORDER — HYDROMORPHONE HCL PF 1 MG/ML IJ SOLN: 1.0000 mg | INTRAMUSCULAR | Status: DC | PRN

## 2011-10-09 MED ORDER — IOHEXOL 300 MG/ML  SOLN
120.0000 mL | Freq: Once | INTRAMUSCULAR | Status: AC | PRN
  Administered 2011-10-09: 120 mL via INTRAVENOUS

## 2011-10-09 MED ORDER — INSULIN ASPART 100 UNIT/ML ~~LOC~~ SOLN: 0.0000 [IU] | Freq: Every day | SUBCUTANEOUS | Status: DC

## 2011-10-09 MED ORDER — SODIUM CHLORIDE 0.9 % IV SOLN
0.2000 ug/kg/h | INTRAVENOUS | Status: DC
  Administered 2011-10-09: 0.7 ug/kg/h via INTRAVENOUS
  Filled 2011-10-09: qty 2

## 2011-10-09 MED ORDER — SODIUM CHLORIDE 0.9 % IV SOLN
200.0000 ug | INTRAVENOUS | Status: DC | PRN
  Administered 2011-10-09: 0.4 ug/kg/h via INTRAVENOUS

## 2011-10-09 MED ORDER — SODIUM CHLORIDE 0.9 % IV SOLN
INTRAVENOUS | Status: DC
  Administered 2011-10-09: 13:00:00 via INTRAVENOUS

## 2011-10-09 MED ORDER — ROCURONIUM BROMIDE 100 MG/10ML IV SOLN
INTRAVENOUS | Status: DC | PRN
  Administered 2011-10-09: 50 mg via INTRAVENOUS
  Administered 2011-10-09: 10 mg via INTRAVENOUS

## 2011-10-09 MED ORDER — SODIUM CHLORIDE 0.9 % IV SOLN
0.4000 ug/kg/h | INTRAVENOUS | Status: DC
  Administered 2011-10-09: 0.7 ug/kg/h
  Administered 2011-10-10: 0.6 ug/kg/h via INTRAVENOUS
  Administered 2011-10-10: 0.7 ug/kg/h via INTRAVENOUS
  Filled 2011-10-09 (×3): qty 4

## 2011-10-09 MED ORDER — DIPHENHYDRAMINE HCL 12.5 MG/5ML PO ELIX
12.5000 mg | ORAL_SOLUTION | Freq: Four times a day (QID) | ORAL | Status: DC | PRN
  Filled 2011-10-09: qty 5

## 2011-10-09 MED ORDER — ASPIRIN 81 MG PO CHEW: 324.0000 mg | CHEWABLE_TABLET | ORAL | Status: DC

## 2011-10-09 MED ORDER — MIDAZOLAM HCL 5 MG/5ML IJ SOLN
INTRAMUSCULAR | Status: DC | PRN
  Administered 2011-10-09: 1 mg via INTRAVENOUS
  Administered 2011-10-09: 2 mg via INTRAVENOUS
  Administered 2011-10-09: 1 mg via INTRAVENOUS

## 2011-10-09 MED ORDER — SODIUM CHLORIDE 0.9 % IV SOLN: 50.0000 ug/h | INTRAVENOUS | Status: DC

## 2011-10-09 MED ORDER — DEXTROSE 10 % IV SOLN: INTRAVENOUS | Status: DC

## 2011-10-09 MED ORDER — FENTANYL BOLUS VIA INFUSION: 50.0000 ug | Freq: Four times a day (QID) | INTRAVENOUS | Status: DC | PRN

## 2011-10-09 MED ORDER — PROPOFOL 10 MG/ML IV EMUL: 5.0000 ug/kg/min | INTRAVENOUS | Status: DC

## 2011-10-09 MED ORDER — DIPHENHYDRAMINE HCL 50 MG/ML IJ SOLN: 12.5000 mg | Freq: Four times a day (QID) | INTRAMUSCULAR | Status: DC | PRN

## 2011-10-09 MED ORDER — FENTANYL CITRATE 0.05 MG/ML IJ SOLN
25.0000 ug | INTRAMUSCULAR | Status: DC | PRN
  Administered 2011-10-09 – 2011-10-10 (×4): 50 ug via INTRAVENOUS
  Filled 2011-10-09 (×3): qty 2

## 2011-10-09 MED ORDER — ONDANSETRON HCL 4 MG PO TABS: 4.0000 mg | ORAL_TABLET | Freq: Four times a day (QID) | ORAL | Status: DC | PRN

## 2011-10-09 MED ORDER — 0.9 % SODIUM CHLORIDE (POUR BTL) OPTIME
TOPICAL | Status: DC | PRN
  Administered 2011-10-09: 1000 mL

## 2011-10-09 MED ORDER — BIOTENE DRY MOUTH MT LIQD
15.0000 mL | Freq: Two times a day (BID) | OROMUCOSAL | Status: DC
  Administered 2011-10-09: 15 mL via OROMUCOSAL

## 2011-10-09 MED ORDER — SODIUM CHLORIDE 0.9 % IV SOLN
250.0000 mL | INTRAVENOUS | Status: DC | PRN
  Administered 2011-10-16: 20 mL via INTRAVENOUS
  Administered 2011-10-20: 250 mL via INTRAVENOUS

## 2011-10-09 MED ORDER — SODIUM CHLORIDE 0.9 % IV SOLN: 0.2000 ug/kg/h | INTRAVENOUS | Status: DC

## 2011-10-09 MED ORDER — SODIUM CHLORIDE 0.9 % IV SOLN
1.0000 g | Freq: Once | INTRAVENOUS | Status: AC
  Administered 2011-10-09: 1 g via INTRAVENOUS
  Filled 2011-10-09: qty 1

## 2011-10-09 MED ORDER — ACETAMINOPHEN 650 MG RE SUPP: 650.0000 mg | Freq: Four times a day (QID) | RECTAL | Status: DC | PRN

## 2011-10-09 MED ORDER — PROMETHAZINE HCL 25 MG/ML IJ SOLN
12.5000 mg | Freq: Once | INTRAMUSCULAR | Status: AC
  Administered 2011-10-09: 25 mg via INTRAVENOUS

## 2011-10-09 MED ORDER — SODIUM CHLORIDE 0.9 % IV SOLN
1.0000 g | INTRAVENOUS | Status: DC
  Administered 2011-10-10 – 2011-10-23 (×15): 1 g via INTRAVENOUS
  Filled 2011-10-09 (×18): qty 1

## 2011-10-09 MED ORDER — ONDANSETRON HCL 4 MG/2ML IJ SOLN
4.0000 mg | Freq: Four times a day (QID) | INTRAMUSCULAR | Status: DC | PRN
  Administered 2011-10-09: 4 mg via INTRAVENOUS
  Filled 2011-10-09: qty 2

## 2011-10-09 MED ORDER — ERTAPENEM SODIUM 1 G IJ SOLR
1.0000 g | INTRAMUSCULAR | Status: DC
  Filled 2011-10-09: qty 1

## 2011-10-09 SURGICAL SUPPLY — 58 items
BARRIER SKIN 2 3/4 (OSTOMY) ×2 IMPLANT
BARRIER SKIN OD2.25 2 3/4 FLNG (OSTOMY) IMPLANT
BLADE SURG ROTATE 9660 (MISCELLANEOUS) IMPLANT
BRR ADH 5X3 SEPRAFILM 6 SHT (MISCELLANEOUS) ×1
BRR ADH 6X5 SEPRAFILM 1 SHT (MISCELLANEOUS) ×1
BRR SKN FLT 2.75X2.25 2 PC (OSTOMY) ×1
CANISTER SUCTION 2500CC (MISCELLANEOUS) ×2 IMPLANT
CHLORAPREP W/TINT 26ML (MISCELLANEOUS) ×2 IMPLANT
CLOTH BEACON ORANGE TIMEOUT ST (SAFETY) ×2 IMPLANT
COVER SURGICAL LIGHT HANDLE (MISCELLANEOUS) ×2 IMPLANT
DRAPE LAPAROSCOPIC ABDOMINAL (DRAPES) ×2 IMPLANT
DRAPE PROXIMA HALF (DRAPES) ×1 IMPLANT
DRAPE UTILITY 15X26 W/TAPE STR (DRAPE) ×4 IMPLANT
DRAPE WARM FLUID 44X44 (DRAPE) ×2 IMPLANT
DRSG PAD ABDOMINAL 8X10 ST (GAUZE/BANDAGES/DRESSINGS) ×1 IMPLANT
ELECT BLADE 6.5 EXT (BLADE) ×1 IMPLANT
ELECT CAUTERY BLADE 6.4 (BLADE) ×4 IMPLANT
ELECT REM PT RETURN 9FT ADLT (ELECTROSURGICAL) ×2
ELECTRODE REM PT RTRN 9FT ADLT (ELECTROSURGICAL) ×1 IMPLANT
GLOVE BIOGEL PI IND STRL 8 (GLOVE) ×2 IMPLANT
GLOVE BIOGEL PI INDICATOR 8 (GLOVE) ×2
GLOVE ECLIPSE 7.5 STRL STRAW (GLOVE) ×4 IMPLANT
GOWN STRL NON-REIN LRG LVL3 (GOWN DISPOSABLE) ×4 IMPLANT
GOWN STRL REIN XL XLG (GOWN DISPOSABLE) ×2 IMPLANT
KIT BASIN OR (CUSTOM PROCEDURE TRAY) ×2 IMPLANT
KIT COLOSTOMY ILEOSTOMY 2.75 (WOUND CARE) ×1 IMPLANT
KIT ROOM TURNOVER OR (KITS) ×2 IMPLANT
LEGGING LITHOTOMY PAIR STRL (DRAPES) ×2 IMPLANT
LIGASURE IMPACT 36 18CM CVD LR (INSTRUMENTS) ×2 IMPLANT
NS IRRIG 1000ML POUR BTL (IV SOLUTION) ×4 IMPLANT
PACK GENERAL/GYN (CUSTOM PROCEDURE TRAY) ×2 IMPLANT
PAD ARMBOARD 7.5X6 YLW CONV (MISCELLANEOUS) ×4 IMPLANT
SEPRAFILM MEMBRANE 5X6 (MISCELLANEOUS) ×1 IMPLANT
SEPRAFILM PROCEDURAL PACK 3X5 (MISCELLANEOUS) ×1 IMPLANT
SPECIMEN JAR X LARGE (MISCELLANEOUS) ×2 IMPLANT
SPONGE GAUZE 4X4 12PLY (GAUZE/BANDAGES/DRESSINGS) ×2 IMPLANT
SPONGE LAP 18X18 X RAY DECT (DISPOSABLE) IMPLANT
STAPLER CUT CVD 40MM GREEN (STAPLE) ×1 IMPLANT
STAPLER PROXIMATE 75MM BLUE (STAPLE) ×1 IMPLANT
STAPLER VISISTAT 35W (STAPLE) ×2 IMPLANT
SUCTION POOLE TIP (SUCTIONS) ×2 IMPLANT
SURGILUBE 2OZ TUBE FLIPTOP (MISCELLANEOUS) IMPLANT
SUT NOVA 1 T20/GS 25DT (SUTURE) ×2 IMPLANT
SUT PDS AB 1 TP1 96 (SUTURE) ×4 IMPLANT
SUT PROLENE 2 0 CT2 30 (SUTURE) IMPLANT
SUT PROLENE 2 0 KS (SUTURE) IMPLANT
SUT SILK 2 0 SH CR/8 (SUTURE) ×2 IMPLANT
SUT SILK 2 0 TIES 10X30 (SUTURE) ×2 IMPLANT
SUT SILK 3 0 SH CR/8 (SUTURE) ×2 IMPLANT
SUT SILK 3 0 TIES 10X30 (SUTURE) ×2 IMPLANT
SUT VIC AB 3-0 SH 8-18 (SUTURE) ×1 IMPLANT
TOWEL OR 17X24 6PK STRL BLUE (TOWEL DISPOSABLE) ×2 IMPLANT
TOWEL OR 17X26 10 PK STRL BLUE (TOWEL DISPOSABLE) ×2 IMPLANT
TRAY FOLEY CATH 14FRSI W/METER (CATHETERS) ×2 IMPLANT
TRAY PROCTOSCOPIC FIBER OPTIC (SET/KITS/TRAYS/PACK) IMPLANT
UNDERPAD 30X30 INCONTINENT (UNDERPADS AND DIAPERS) IMPLANT
WATER STERILE IRR 1000ML POUR (IV SOLUTION) ×2 IMPLANT
YANKAUER SUCT BULB TIP NO VENT (SUCTIONS) ×2 IMPLANT

## 2011-10-09 NOTE — ED Provider Notes (Deleted)
71 year old female, recent transfer from [facility] to the ED to be seen by general surgery. Patient has an acute perforation of the sigmoid colon and evidence of an abscess formation that'll require further management by the surgical team. [facility] has been notified, Edmund Hilda will see patient in CDU. Patient is currently in no acute distress, vital signs stable.  Fayrene Helper, PA-C 10/10/11 [phone]

## 2011-10-09 NOTE — H&P (Addendum)
Name: [name] MRN: [id number] DOB: [birth year]    LOS: 0  PCCM ADMISSION NOTE  History of Present Illness: [name] is a 71 y.o. Woman admitted as a transfer from Dr. Andrey Campanile (surgery) team.  Initially she presented to [facility]  with c/o progressive abd pain and constipation for 7 days. Her pain intensified over the past few days and she developed fever and nausea as well. She has had a BM, but it was small and loose. She denied hematochezia. Work up revealed elevated WBC and some free air on plain films, this prompted surgical eval and CT. CT done shows free air and sigmoid diverticulitis with a 6cm air/fluid abscess.  She has known hx of diverticulosis on prior colonoscopy, but no hx of diverticulitis requiring hospitalization and/or abx. Pt requested [facility] Patient underwent emergent explorative laparotomy with sigmoid bowel resection and colostomy by Dr. Andrey Campanile on 10/08/10. She tolerated procedure without complications.  It was decided to continue to keep her on vent support overnight due to OSA and morbid obesity with plan to possibly extubate in am. Hence, PCCM assistance was requested for vent management.  Lines / Drains: L periph line 1/15>>> PICC line (triple lumen) 1/15>>> ETT 1/15>>> NGT 1/15>>> Colostomy 1/15>>>  Cultures: MRSA PCR neg 1/15 UA neg 1/15 BCx x2 1/15 (drawn after initiation of Abx Tx)>>  Antibiotics: Ertapenem 1/15>>>  Tests / Events: Acute Abdomen series XR 1/15>>>  free intra-abdominal air under the right  hemidiaphragm. Mild bibasilar airspace opacities likely reflect atelectasis;  borderline cardiomegaly. CT abdomen 1/15>>>Perforation of the sigmoid colon secondary to diverticulitis with  adjacent diverticular abscess and free air in the abdomen.  Cholelithiasis.  Evidence of chronic calcific pancreatitis. 10/09/11 Sigmoid colon resection and colostomy >>    The patient is sedated, intubated and unable to provide history, which was  obtained for available medical records.    Past Medical History  Diagnosis Date  . Hypertension   . Diabetes mellitus   . Arthritis    Past Surgical History  Procedure Date  . Abdominal hysterectomy   . Appendectomy   . Back surgery    Prior to Admission medications   Medication Sig Start Date End Date Taking? Authorizing Provider  acetaminophen (TYLENOL) 325 MG tablet Take 325-650 mg by mouth every 6 (six) hours as needed. Pain   Yes Historical Provider, MD  aspirin EC 325 MG tablet Take 325 mg by mouth daily.   Yes Historical Provider, MD  CALCIUM-VITAMIN D PO Take 2 tablets by mouth daily.   Yes Historical Provider, MD  estradiol (VIVELLE-DOT) 0.05 MG/24HR Place 1 patch onto the skin 2 (two) times a week.   Yes Historical Provider, MD  Glucosamine-Chondroit-Vit C-Mn (GLUCOSAMINE 1500 COMPLEX PO) Take 2 tablets by mouth daily.   Yes Historical Provider, MD  losartan (COZAAR) 100 MG tablet Take 100 mg by mouth daily.   Yes Historical Provider, MD  meloxicam (MOBIC) 15 MG tablet Take 15 mg by mouth daily.   Yes Historical Provider, MD  metFORMIN (GLUCOPHAGE) 500 MG tablet Take 500 mg by mouth 2 (two) times daily with a meal.   Yes Historical Provider, MD  montelukast (SINGULAIR) 10 MG tablet Take 10 mg by mouth at bedtime.   Yes Historical Provider, MD  OVER THE COUNTER MEDICATION Take 2 capsules by mouth daily. Cinnamon plus chromium   Yes Historical Provider, MD  OVER THE COUNTER MEDICATION Take 2 tablets by mouth daily. Chondroitian 1200mg    Yes Historical Provider,  MD  phenylephrine (VICKS SINEX ULTRA FINE MIST) 0.5 % nasal solution Place 1 drop into the nose every 4 (four) hours as needed. Nasal Congestion.   Yes Historical Provider, MD  sitaGLIPtin (JANUVIA) 50 MG tablet Take 50 mg by mouth daily.   Yes Historical Provider, MD   Allergies Allergies  Allergen Reactions  . Codeine Itching    Family History History reviewed. No pertinent family history.  Social History   reports that she has been smoking.  She does not have any smokeless tobacco history on file. She reports that she does not drink alcohol or use illicit drugs.  Review Of Systems  Cannot obtain due to intubation  Vital Signs: Temp:  [98.1 F (36.7 C)-100.7 F (38.2 C)] 98.9 F (37.2 C) (01/15 1951) Pulse Rate:  [75-89] 75  (01/15 1354) Resp:  [20-22] 20  (01/15 1354) BP: (99-133)/(37-70) 99/37 mmHg (01/15 1938) SpO2:  [79 %-97 %] 95 % (01/15 1354) FiO2 (%):  [99.9 %] 99.9 % (01/15 1939) Weight:  [285 lb (129.275 kg)-299 lb 2.6 oz (135.7 kg)] 299 lb 2.6 oz (135.7 kg) (01/15 1354) I/O last 3 completed shifts: In: 4175 [I.V.:4175] Out: 650 [Urine:200; Blood:450]  Physical Examination: General:  Intubated and sedated; RASS -1 Neuro: sedated HEENT:  Pupils 3 mm PERRL bilaterally; no icterus Neck:  No JVD's no masses  Cardiovascular:  RRR, no MR Lungs:  ETT with few secretions, bilateral rhonchi worse on left Abdomen:  BS present; obese and slightly distended; soft; open wound midline with dressing and no exudate; stoma with minimal snaguinous discahrge and no output otherwise. Musculoskeletal:  No effusions or joint deformities bilaterally. Skin:  Warm, no rashes. Left arm periph line wihtout erythema, edema, increased warmth or infiltration.  Ventilator settings: Vent Mode:  [-] PRVC FiO2 (%):  [99.9 %] 99.9 % Set Rate:  [14 bmp] 14 bmp Vt Set:  [460 mL] 460 mL PEEP:  [5.1 cmH20] 5.1 cmH20 Plateau Pressure:  [18 cmH20] 18 cmH20  Labs and Imaging:  Reviewed.  Please refer to the Assessment and Plan section for relevant results.  Assessment and Plan: NEUROLOGIC: AMS due to sedation/intubation. RASS goal -1-2 -continue precedex -add versed and fentanyl due to continued agitation -daily WUA  PULMONARY: Hx of OSA  -on vent now; initially acidemic when non-synchronous; RR increased -ABG in am -PCXR in am -plan is to extubate in am -may need lasix, depending on BP and  UOP  CARDIOVASCULAR: No hx of CAD Hypotensive now, likely related to significant sedation given overnight Levophed now as CVP 16 UOP improving, continue IVF for now but may need lasix if BP improves and BMET OK 12 lead EKG now and cardiac enzymes  RENAL: Hyponatremia, mild. Euvolemic. Likely postoperative hyponatremia. -change 1/2 Ns to 0.9 NS IVF at 125 cc/hr -follow u/o (Foley to gravity) -bmet in am  Lab 10/09/11 0551  NA 131*  K 4.4  CL 97  CO2 26  GLUCOSE 168*  BUN 14  CREATININE 0.84  CALCIUM 9.0  MG --  PHOS --    GASTROINTESTINAL: 1. Sigmoid diverticulitis/abscess perforation sp colon resection and colostomy placement by Dr. Andrey Campanile -wound and stoma care per surgery team -NPO -NS IVF -follow stoma OU - on Invanz day #1 see ID section  2. Chronic pancreatitis  Per CT of abdomen -amylase -cmet in am -NPO -consider to check TG   HEMATOLOGIC: 1. Anemia sp colon resection.  No S:S of active bleed -follow H:H  2 Lab 10/09/11 0551  HGB  11.4*  HCT 36.0  WBC 27.4*  PLT 335     INFECTIOUS: Leukocytosis/Abdominal wound sp sigmoid diverticula abscess resection on 10/09/11 -Invanz day#1 -Bcx drawn but may not be helpful as were drawn after intuition of therapy with Invanz -wound care per surgery team   Lab 10/09/11 0551  WBC 27.4*   No results found for this basename: PROCALCITON:5 in the last 168 hours   ENDOCRINE: DM, type 2 -NPO sp abdominal surgery - ICU Hyperglycemia protocol -CBG's  Lab 10/09/11 1953 10/09/11 1502 10/09/11 1238  GLUCAP 155* 161* 144*    BEST PRACTICE / DISPOSITION: -->  ICU status under PCCM -->  Full code -->  NPO -->  Heparin Puako for DVT Px -->  Protonix IV for GI Px -->  Ventilator bundle -->  Family updated at bedside   KARIMOVA,NODIRA 10/09/2011, 8:09 PM   Oluchi Pucci

## 2011-10-09 NOTE — Addendum Note (Signed)
Addendum  created 10/09/11 2210 by Glendora Score, CRNA   Modules edited:Anesthesia Events

## 2011-10-09 NOTE — ED Notes (Addendum)
Assisted Dr. Read Drivers to perform rectal exam.  Patient denies any nausea or vomiting

## 2011-10-09 NOTE — ED Provider Notes (Addendum)
History     CSN: [id number]  Arrival date & time 10/09/11  5284   First MD Initiated Contact with Patient 10/09/11 0355      Chief Complaint  Patient presents with  . Constipation    (Consider location/radiation/quality/duration/timing/severity/associated sxs/prior treatment) HPI This is a 71 year old white female who was recently placed on iron supplements. She states she became very constipated and has not had a normal bowel movement in a week. She describes her symptoms as severe, with pain present for the past 4 days. She has a distended abdomen but does not have any nausea or vomiting. She's had difficulty urinating. She's had a poor appetite. She has been passing some liquid stool but not adequate to relieve her discomfort. It is noted that she has a low-grade fever on arrival in the ED. She has not had relief with laxatives or enema at home.  Past Medical History  Diagnosis Date  . Hypertension   . Diabetes mellitus   . Arthritis     Past Surgical History  Procedure Date  . Abdominal hysterectomy   . Appendectomy   . Back surgery     No family history on file.  History  Substance Use Topics  . Smoking status: Current Some Day Smoker  . Smokeless tobacco: Not on file  . Alcohol Use: No    OB History    Grav Para Term Preterm Abortions TAB SAB Ect Mult Living                  Review of Systems  All other systems reviewed and are negative.    Allergies  Codeine  Home Medications   Current Outpatient Rx  Name Route Sig Dispense Refill  . SITAGLIPTIN PHOSPHATE 25 MG PO TABS Oral Take 25 mg by mouth daily.      BP 125/51  Pulse 89  Temp(Src) 100.7 F (38.2 C) (Oral)  Resp 20  Ht 5\' 5"  (1.651 m)  Wt 285 lb (129.275 kg)  BMI 47.43 kg/m2  SpO2 93%  Physical Exam General: Well-developed, well-nourished female in no acute distress; appearance consistent with age of record HENT: normocephalic, atraumatic Eyes: pupils equal round and reactive to  light; extraocular muscles intact Neck: supple Heart: regular rate and rhythm Lungs: normal respiratory effort and excursion Abdomen: soft; nondistended; obese; mild diffuse tenderness; decreased bowel sounds Rectal: no formed stool palpated; watery brown stool noted on examining glove Extremities: No deformity; full range of motion Neurologic: Awake, alert and oriented; motor function intact in all extremities and symmetric; no facial droop Skin: Warm and dry     ED Course  Procedures (including critical care time)     MDM   Nursing notes and vitals signs, including pulse oximetry, reviewed.  Summary of this visit's results, reviewed by myself:  Labs:  Results for orders placed during the hospital encounter of 10/09/11  URINALYSIS, ROUTINE W REFLEX MICROSCOPIC      Component Value Range   Color, Urine YELLOW  YELLOW    APPearance CLEAR  CLEAR    Specific Gravity, Urine <1.005 (*) 1.005 - 1.030    pH 7.0  5.0 - 8.0    Glucose, UA NEGATIVE  NEGATIVE (mg/dL)   Hgb urine dipstick TRACE (*) NEGATIVE    Bilirubin Urine NEGATIVE  NEGATIVE    Ketones, ur 15 (*) NEGATIVE (mg/dL)   Protein, ur TRACE (*) NEGATIVE (mg/dL)   Urobilinogen, UA 0.2  0.0 - 1.0 (mg/dL)   Nitrite NEGATIVE  NEGATIVE  Leukocytes, UA NEGATIVE  NEGATIVE   URINE MICROSCOPIC-ADD ON      Component Value Range   Squamous Epithelial / LPF FEW (*) RARE    WBC, UA 0-2  <3 (WBC/hpf)   RBC / HPF 0-2  <3 (RBC/hpf)   Bacteria, UA RARE  RARE   CBC      Component Value Range   WBC 27.4 (*) 4.0 - 10.5 (K/uL)   RBC 4.71  3.87 - 5.11 (MIL/uL)   Hemoglobin 11.4 (*) 12.0 - 15.0 (g/dL)   HCT 16.1  09.6 - 04.5 (%)   MCV 76.4 (*) 78.0 - 100.0 (fL)   MCH 24.2 (*) 26.0 - 34.0 (pg)   MCHC 31.7  30.0 - 36.0 (g/dL)   RDW 40.9 (*) 81.1 - 15.5 (%)   Platelets 335  150 - 400 (K/uL)  DIFFERENTIAL      Component Value Range   Neutrophils Relative 86 (*) 43 - 77 (%)   Neutro Abs 23.6 (*) 1.7 - 7.7 (K/uL)   Lymphocytes  Relative 8 (*) 12 - 46 (%)   Lymphs Abs 2.1  0.7 - 4.0 (K/uL)   Monocytes Relative 6  3 - 12 (%)   Monocytes Absolute 1.7 (*) 0.1 - 1.0 (K/uL)   Eosinophils Relative 0  0 - 5 (%)   Eosinophils Absolute 0.0  0.0 - 0.7 (K/uL)   Basophils Relative 0  0 - 1 (%)   Basophils Absolute 0.0  0.0 - 0.1 (K/uL)  BASIC METABOLIC PANEL      Component Value Range   Sodium 131 (*) 135 - 145 (mEq/L)   Potassium 4.4  3.5 - 5.1 (mEq/L)   Chloride 97  96 - 112 (mEq/L)   CO2 26  19 - 32 (mEq/L)   Glucose, Bld 168 (*) 70 - 99 (mg/dL)   BUN 14  6 - 23 (mg/dL)   Creatinine, Ser 9.14  0.50 - 1.10 (mg/dL)   Calcium 9.0  8.4 - 78.2 (mg/dL)   GFR calc non Af Amer 68 (*) >90 (mL/min)   GFR calc Af Amer 79 (*) >90 (mL/min)    Imaging Studies: Ct Abdomen Pelvis W Contrast  10/09/2011  *RADIOLOGY REPORT*  Clinical Data: Free air in the abdomen.  CT ABDOMEN AND PELVIS WITH CONTRAST  Technique:  Multidetector CT imaging of the abdomen and pelvis was performed following the standard protocol during bolus administration of intravenous contrast.  Contrast: OMNIPAQUE IOHEXOL 300 MG/ML IV SOLN  Comparison: radiographs dated 10/09/2011  Findings: There is free air in the abdomen.  The patient has perforated sigmoid diverticulitis with a 6.2 cm air containing abscess in the left lower quadrant.  There is extensive diverticulosis of the distal descending and sigmoid portions of the colon.  There is also free air in the soft tissues of the mesentery adjacent to the perforation.  There is at least one small stone in the neck of the gallbladder. Gallbladder is not distended.  Liver, spleen, adrenal glands, and kidneys are normal.  There are multiple calcifications in the otherwise normal appearing pancreas consistent with chronic calcific pancreatitis.  No acute osseous abnormalities.  IMPRESSION: Perforation of the sigmoid colon secondary to diverticulitis with adjacent diverticular abscess and free air in the abdomen.   Cholelithiasis.  Evidence of chronic calcific pancreatitis.  Original Report Authenticated By: Gwynn Burly, M.D.   Dg Abd Acute W/chest  10/09/2011  *RADIOLOGY REPORT*  Clinical Data: Constipation.  ACUTE ABDOMEN SERIES (ABDOMEN 2 VIEW & CHEST 1  VIEW)  Comparison: Chest radiograph performed 01/23/2010, and lumbar spine radiograph performed 08/31/2005  Findings: The lungs are well-aerated.  Mild bibasilar airspace opacities likely reflect atelectasis.  There is no evidence of pleural effusion or pneumothorax.  The cardiomediastinal silhouette is borderline enlarged.  There is question of a small amount of air under the right hemidiaphragm.  The visualized bowel gas pattern is unremarkable.  Fluid and air are noted throughout the colon; there is no evidence of small bowel dilatation to suggest obstruction.  No acute osseous abnormalities are seen; the sacroiliac joints are unremarkable in appearance.  IMPRESSION:  1.  Question of free intra-abdominal air under the right hemidiaphragm. 2.  Otherwise unremarkable bowel gas pattern; no evidence for obstruction. 3.  Mild bibasilar airspace opacities likely reflect atelectasis; borderline cardiomegaly.  Critical Value/emergent results were called by telephone at the time of interpretation on 10/09/2011  at 05:20 a.m.  to  Dr. Brock Bad, who verbally acknowledged these results.  Original Report Authenticated By: Tonia Ghent, M.D.   7:22 AM 1 g Invanz IV ordered for perforated diverticulum. Dr. Leticia Penna to see.  EKG Interpretation:  Date & Time: 10/09/2011 7:28 AM  Rate: 92  Rhythm: normal sinus rhythm  QRS Axis: normal  Intervals: normal  ST/T Wave abnormalities: normal  Conduction Disutrbances:none  Narrative Interpretation: low-voltage QRS; poor R-wave progression  Old EKG Reviewed: first degree AV block noted previously            Hanley Seamen, MD 10/09/11 7829  Hanley Seamen, MD 10/09/11 [id number]

## 2011-10-09 NOTE — ED Notes (Signed)
Dr ziegler here to eval 

## 2011-10-09 NOTE — H&P (Signed)
Chief Complaint: Abd pain, free air. HPI: [name] is an 71 y.o. female with c/o progressive abd pain for about a week. She has been constipated, which is a problem for her and she usually takes some OTC laxatives to help her motility. Her pain intensified over the past few days and she developed fever and nausea as well. She has had a BM, but it was small and loose. She denies hematochezia. She denies CP, SOB. She went to [facility] and was worked up. This found elevated WBC and some free air on plain films, this prompted surgical eval and CT. CT done shows free air and sigmoid diverticulitis with a 6cm air/fluid abscess. Surgery consulted but pt requested transfer to [facility]. She is now here in [facility] ER. She c/o nausea and pain but states the pain is mildly better after receiving pain meds. She reports known hx of diverticulosis on prior colonoscopy, but denies any hx of diverticulitis requiring hospitalization and/or abx. Surgery requested to eval.  Past Medical History:  Past Medical History  Diagnosis Date  . Hypertension   . Diabetes mellitus   . Arthritis     Past Surgical History:  Past Surgical History  Procedure Date  . Abdominal hysterectomy   . Appendectomy   . Back surgery     Family History: No family history on file.  Social History:  reports that she has history of tobacco abuse, but not currently smoking.  She does not have any smokeless tobacco history on file. She reports that she does not drink alcohol or use illicit drugs. She is retired.  Allergies:  Allergies  Allergen Reactions  . Codeine Itching    Medications: See PTA med list  See HPI  Blood pressure 129/70, pulse 81, temperature 98.9 F (37.2 C), temperature source Oral, resp. rate 20, height 5\' 5"  (1.651 m), weight 129.275 kg (285 lb), SpO2 79.00%. Body mass index is 47.43 kg/(m^2).    General Appearance:  Alert, cooperative, obese 71 yo female. Obvious moderate to severe distress.  Head:   Normocephalic, without obvious abnormality, atraumatic  ENT: Unremarkable  Neck: Supple, symmetrical, trachea midline, no adenopathy, thyroid: not enlarged, symmetric, no tenderness/mass/nodules  Lungs:   Clear to auscultation bilaterally, no w/r/r, respirations unlabored without use of accessory muscles.  Chest Wall:  No tenderness or deformity  Heart:  Regular rate and rhythm, S1, S2 normal, no murmur, rub or gallop. Carotids 2+ without bruit.  Abdomen:   Obese, diffusely tender. No gross peritoneal signs. No mass effect.  Genitalia:  Normal. No hernias  Rectal:  Deferred.  Extremities: Extremities normal, atraumatic, no cyanosis or edema  Pulses: 2+ and symmetric  Skin: Skin color, texture, turgor normal, no rashes or lesions  Neurologic: Normal affect, no gross deficits.   Results for orders placed during the hospital encounter of 10/09/11 (from the past 48 hour(s))  URINALYSIS, ROUTINE W REFLEX MICROSCOPIC     Status: Abnormal   Collection Time   10/09/11  4:48 AM      Component Value Range Comment   Color, Urine YELLOW  YELLOW     APPearance CLEAR  CLEAR     Specific Gravity, Urine <1.005 (*) 1.005 - 1.030     pH 7.0  5.0 - 8.0     Glucose, UA NEGATIVE  NEGATIVE (mg/dL)    Hgb urine dipstick TRACE (*) NEGATIVE     Bilirubin Urine NEGATIVE  NEGATIVE     Ketones, ur 15 (*) NEGATIVE (mg/dL)  Protein, ur TRACE (*) NEGATIVE (mg/dL)    Urobilinogen, UA 0.2  0.0 - 1.0 (mg/dL)    Nitrite NEGATIVE  NEGATIVE     Leukocytes, UA NEGATIVE  NEGATIVE    URINE MICROSCOPIC-ADD ON     Status: Abnormal   Collection Time   10/09/11  4:48 AM      Component Value Range Comment   Squamous Epithelial / LPF FEW (*) RARE     WBC, UA 0-2  <3 (WBC/hpf)    RBC / HPF 0-2  <3 (RBC/hpf)    Bacteria, UA RARE  RARE    CBC     Status: Abnormal   Collection Time   10/09/11  5:51 AM      Component Value Range Comment   WBC 27.4 (*) 4.0 - 10.5 (K/uL)    RBC 4.71  3.87 - 5.11 (MIL/uL)    Hemoglobin 11.4  (*) 12.0 - 15.0 (g/dL)    HCT 16.1  09.6 - 04.5 (%)    MCV 76.4 (*) 78.0 - 100.0 (fL)    MCH 24.2 (*) 26.0 - 34.0 (pg)    MCHC 31.7  30.0 - 36.0 (g/dL)    RDW 40.9 (*) 81.1 - 15.5 (%)    Platelets 335  150 - 400 (K/uL)   DIFFERENTIAL     Status: Abnormal   Collection Time   10/09/11  5:51 AM      Component Value Range Comment   Neutrophils Relative 86 (*) 43 - 77 (%)    Neutro Abs 23.6 (*) 1.7 - 7.7 (K/uL)    Lymphocytes Relative 8 (*) 12 - 46 (%)    Lymphs Abs 2.1  0.7 - 4.0 (K/uL)    Monocytes Relative 6  3 - 12 (%)    Monocytes Absolute 1.7 (*) 0.1 - 1.0 (K/uL)    Eosinophils Relative 0  0 - 5 (%)    Eosinophils Absolute 0.0  0.0 - 0.7 (K/uL)    Basophils Relative 0  0 - 1 (%)    Basophils Absolute 0.0  0.0 - 0.1 (K/uL)   BASIC METABOLIC PANEL     Status: Abnormal   Collection Time   10/09/11  5:51 AM      Component Value Range Comment   Sodium 131 (*) 135 - 145 (mEq/L)    Potassium 4.4  3.5 - 5.1 (mEq/L)    Chloride 97  96 - 112 (mEq/L)    CO2 26  19 - 32 (mEq/L)    Glucose, Bld 168 (*) 70 - 99 (mg/dL)    BUN 14  6 - 23 (mg/dL)    Creatinine, Ser 9.14  0.50 - 1.10 (mg/dL)    Calcium 9.0  8.4 - 10.5 (mg/dL)    GFR calc non Af Amer 68 (*) >90 (mL/min)    GFR calc Af Amer 79 (*) >90 (mL/min)   SAMPLE TO BLOOD BANK     Status: Normal   Collection Time   10/09/11  7:23 AM      Component Value Range Comment   Blood Bank Specimen SAMPLE AVAILABLE FOR TESTING      Sample Expiration 10/12/2011      Ct Abdomen Pelvis W Contrast  10/09/2011  *RADIOLOGY REPORT*  Clinical Data: Free air in the abdomen.  CT ABDOMEN AND PELVIS WITH CONTRAST  Technique:  Multidetector CT imaging of the abdomen and pelvis was performed following the standard protocol during bolus administration of intravenous contrast.  Contrast: OMNIPAQUE IOHEXOL 300 MG/ML IV  SOLN  Comparison: radiographs dated 10/09/2011  Findings: There is free air in the abdomen.  The patient has perforated sigmoid diverticulitis  with a 6.2 cm air containing abscess in the left lower quadrant.  There is extensive diverticulosis of the distal descending and sigmoid portions of the colon.  There is also free air in the soft tissues of the mesentery adjacent to the perforation.  There is at least one small stone in the neck of the gallbladder. Gallbladder is not distended.  Liver, spleen, adrenal glands, and kidneys are normal.  There are multiple calcifications in the otherwise normal appearing pancreas consistent with chronic calcific pancreatitis.  No acute osseous abnormalities.  IMPRESSION: Perforation of the sigmoid colon secondary to diverticulitis with adjacent diverticular abscess and free air in the abdomen.  Cholelithiasis.  Evidence of chronic calcific pancreatitis.  Original Report Authenticated By: Gwynn Burly, M.D.   Dg Abd Acute W/chest  10/09/2011  *RADIOLOGY REPORT*  Clinical Data: Constipation.  ACUTE ABDOMEN SERIES (ABDOMEN 2 VIEW & CHEST 1 VIEW)  Comparison: Chest radiograph performed 01/23/2010, and lumbar spine radiograph performed 08/31/2005  Findings: The lungs are well-aerated.  Mild bibasilar airspace opacities likely reflect atelectasis.  There is no evidence of pleural effusion or pneumothorax.  The cardiomediastinal silhouette is borderline enlarged.  There is question of a small amount of air under the right hemidiaphragm.  The visualized bowel gas pattern is unremarkable.  Fluid and air are noted throughout the colon; there is no evidence of small bowel dilatation to suggest obstruction.  No acute osseous abnormalities are seen; the sacroiliac joints are unremarkable in appearance.  IMPRESSION:  1.  Question of free intra-abdominal air under the right hemidiaphragm. 2.  Otherwise unremarkable bowel gas pattern; no evidence for obstruction. 3.  Mild bibasilar airspace opacities likely reflect atelectasis; borderline cardiomegaly.  Critical Value/emergent results were called by telephone at the time of  interpretation on 10/09/2011  at 05:20 a.m.  to  Dr. Brock Bad, who verbally acknowledged these results.  Original Report Authenticated By: Tonia Ghent, M.D.    Assessment: Principal Problem:  *Diverticulitis of colon with perforation Active Problems:  Pneumoperitoneum - abdomen  Hypertension  Diabetes mellitus  Plan: Admit and begin IVF and antiobiotics Dr. Andrey Campanile to assess for urgent surgical intervention. I suspect she will need resection with Hartman's procedure. Rosanne Sack F 10/09/2011, 11:43 AM

## 2011-10-09 NOTE — ED Notes (Signed)
edp in to discuss plan of care with pt. Pt and husband discussing options.

## 2011-10-09 NOTE — Brief Op Note (Signed)
10/09/2011  6:37 PM  PATIENT:  [name]  71 y.o. female  PRE-OPERATIVE DIAGNOSIS:  Perforated sigmoid diverticulitis  POST-OPERATIVE DIAGNOSIS:  same  PROCEDURE:  Procedure(s): Exploratory laparotomy Sigmoid colectomy with end colostomy  SURGEON:  Mary Sella. Andrey Campanile, MD FACS  ASSISTANTS: Violeta Gelinas, MD, FACS Stevan Born, MD, FACS  ANESTHESIA:   general  EBL:  Total I/O In: 3175 [I.V.:3175] Out: 550 [Urine:150; Blood:400]  BLOOD ADMINISTERED:none  DRAINS: Nasogastric Tube and Urinary Catheter (Foley)   LOCAL MEDICATIONS USED:  NONE  SPECIMEN:  Source of Specimen:  sigmod colon, stitch distal margin  DISPOSITION OF SPECIMEN:  PATHOLOGY  COUNTS:  YES  TOURNIQUET:  * No tourniquets in log *  DICTATION: .Other Dictation: Dictation Number [id number]  PLAN OF CARE: Admit to inpatient   PATIENT DISPOSITION:  ICU - intubated and hemodynamically stable.   Delay start of Pharmacological VTE agent (>24hrs) due to surgical blood loss or risk of bleeding:  {YES/NO/NOT APPLICABLE:20182  Mary Sella. Andrey Campanile, MD, FACS General, Bariatric, & Minimally Invasive Surgery [facility], [place]

## 2011-10-09 NOTE — ED Notes (Signed)
[facility] called to page surgeon on call for [facility].  Dr. Andrey Campanile returned call to Dr. Colon Branch.

## 2011-10-09 NOTE — ED Notes (Signed)
Pt has declined to have her surgery here. States she wants to speak with Dr. Rinaldo Cloud, her gastro MD to see what his recommendations are. edp aware

## 2011-10-09 NOTE — Interval H&P Note (Signed)
History and Physical Interval Note:  10/09/2011 3:25 PM  [name]  has presented today for surgery, with the diagnosis of Perf. Colon  The various methods of treatment have been discussed with the patient and family. After consideration of risks, benefits and other options for treatment, the patient has consented to  Procedure(s): COLON RESECTION as a surgical intervention .  The patients' history has been reviewed, patient examined, no change in status, stable for surgery.  I have reviewed the patients' chart and labs.  Questions were answered to the patient's satisfaction.    Mary Sella. Andrey Campanile, MD, FACS General, Bariatric, & Minimally Invasive Surgery [facility], [place]   [facility] M

## 2011-10-09 NOTE — Progress Notes (Signed)
ANTIBIOTIC CONSULT NOTE - INITIAL  Pharmacy Consult for Invanz Indication: Perforated diverticulitis  Allergies  Allergen Reactions  . Codeine Itching    Patient Measurements: Height: 5\' 5"  (165.1 cm) Weight: 299 lb 2.6 oz (135.7 kg) IBW/kg (Calculated) : 57  Adjusted Body Weight:    Vital Signs: Temp: 98.9 F (37.2 C) (01/15 1951) Temp src: Oral (01/15 1951) BP: 99/37 mmHg (01/15 1938) Pulse Rate: 75  (01/15 1354) Intake/Output from previous day:   Intake/Output from this shift: Total I/O In: 200 [IV Piggyback:200] Out: 50 [Urine:50]  Labs:  [facility] 10/09/11 0551  WBC 27.4*  HGB 11.4*  PLT 335  LABCREA --  CREATININE 0.84   Estimated Creatinine Clearance: 84.6 ml/min (by C-G formula based on Cr of 0.84). No results found for this basename: VANCOTROUGH:2,VANCOPEAK:2,VANCORANDOM:2,GENTTROUGH:2,GENTPEAK:2,GENTRANDOM:2,TOBRATROUGH:2,TOBRAPEAK:2,TOBRARND:2,AMIKACINPEAK:2,AMIKACINTROU:2,AMIKACIN:2, in the last 72 hours   Microbiology: Recent Results (from the past 720 hour(s))  MRSA PCR SCREENING     Status: Normal   Collection Time   10/09/11  1:39 PM      Component Value Range Status Comment   MRSA by PCR NEGATIVE  NEGATIVE  Final     Medical History: Past Medical History  Diagnosis Date  . Hypertension   . Diabetes mellitus   . Arthritis     Medications:  Scheduled:    . aspirin  324 mg Oral NOW   Or  . aspirin  300 mg Rectal NOW  . ertapenem  1 g Intravenous Once  . ertapenem (INVANZ) IV  1 g Intravenous Q24H  . fentaNYL  50 mcg Intravenous Once  . heparin  5,000 Units Subcutaneous Q8H  . heparin  5,000 Units Subcutaneous Q8H  . insulin aspart  0-20 Units Subcutaneous TID WC  . insulin aspart  0-4 Units Subcutaneous Q4H  . insulin aspart  0-5 Units Subcutaneous QHS  . insulin aspart  3 Units Subcutaneous Q4H  . ondansetron (ZOFRAN) IV  4 mg Intravenous Once  . pantoprazole (PROTONIX) IV  40 mg Intravenous QHS  . promethazine  12.5 mg  Intravenous Once  . DISCONTD: antiseptic oral rinse  15 mL Mouth Rinse BID  . DISCONTD: ertapenem  1 g Intravenous Q24H  . DISCONTD: insulin aspart  0-20 Units Subcutaneous TID WC  . DISCONTD: pantoprazole (PROTONIX) IV  40 mg Intravenous QHS   Infusions:    . sodium chloride    . dexmedetomidine (PRECEDEX) IV infusion    . fentaNYL infusion INTRAVENOUS    . fentaNYL infusion INTRAVENOUS    . propofol    . DISCONTD: 0.45 % NaCl with KCl 20 mEq / L    . DISCONTD: sodium chloride Stopped (10/09/11 1414)  . DISCONTD: dexmedetomidine (PRECEDEX) IV infusion    . DISCONTD: dexmedetomidine (PRECEDEX) IV infusion    . DISCONTD: dextrose    . DISCONTD: lactated ringers 50 mL/hr at 10/09/11 1414   Assessment: CC: Abdominal pain, constipation, fever, nausea, leukocytosis  10/09/11: Exploratory laparotomy  Sigmoid colectomy with end colostomy for perforated diverticulitis  Plan:  Invanz 1g IV q24h.  Misty Stanley Stillinger 10/09/2011,8:26 PM

## 2011-10-09 NOTE — Progress Notes (Signed)
Pt. Received to 2603 at 1354 from ED.  Report received from [place], [place].  Pt. Transferred to unit well, VSS, no complaints of pain.  OR called at 1400 to receive pt. For surgical ostomy placement.  Pt. Transported to OR @ 1408.  VSS, pt. Resting comfortably.

## 2011-10-09 NOTE — ED Notes (Signed)
Pt from [facility] awaiting surgical consult for perforation of sigmoid colon. Pt states constipation for 2.5 weeks since being on iron supplements for anemia. States last bowel mvt was 1 week ago. Denies n/v. States pain tolerable with analgesia given pta. Pt a&ox3, ambulatory, will cont to monitor.

## 2011-10-09 NOTE — Addendum Note (Signed)
Addendum  created 10/09/11 2210 by Melanny Wire, CRNA   Modules edited:Anesthesia Events    

## 2011-10-09 NOTE — ED Notes (Addendum)
To BR via wheelchair with assistance - voided QS.  Returned to room, iv started and labs drawn.  CT notified ready for transport.   Also advised to remain NPO

## 2011-10-09 NOTE — Transfer of Care (Signed)
Immediate Anesthesia Transfer of Care Note  Patient: [name]  Procedure(s) Performed:  COLON RESECTION - Colon Resection with colostomy  Patient Location: SICU  Anesthesia Type: General  Level of Consciousness: sedated  Airway & Oxygen Therapy: Patient remains intubated per anesthesia plan and Patient placed on Ventilator (see vital sign flow sheet for setting)  Post-op Assessment: Report given to PACU RN, Post -op Vital signs reviewed and stable and Patient moving all extremities  Post vital signs: Reviewed and stable Filed Vitals:   10/09/11 1354  BP: 112/42  Pulse: 75  Temp: 36.7 C  Resp: 20    Complications: No apparent anesthesia complications

## 2011-10-09 NOTE — ED Notes (Signed)
Consulting surgeon team at bedside.

## 2011-10-09 NOTE — Anesthesia Postprocedure Evaluation (Signed)
  Anesthesia Post-op Note  Patient: [name]  Procedure(s) Performed:  COLON RESECTION - Colon Resection with colostomy  Patient Location: PACU  Anesthesia Type: General  Level of Consciousness: Patient remains intubated per anesthesia plan  Airway and Oxygen Therapy: Patient remains intubated per anesthesia plan and Patient placed on Ventilator (see vital sign flow sheet for setting)  Post-op Pain: none  Post-op Assessment: Post-op Vital signs reviewed and Patient's Cardiovascular Status Stable  Post-op Vital Signs: stable  Complications: No apparent anesthesia complications

## 2011-10-09 NOTE — ED Notes (Signed)
To xray via wheelchair.  Patient breaths better and has less discomfort when sitting upright.  Does not tolerate being flat.

## 2011-10-09 NOTE — ED Notes (Signed)
Returns from CT.  Alert and oriented.

## 2011-10-09 NOTE — ED Notes (Signed)
Pt nauseated. Consult aware, order given.

## 2011-10-09 NOTE — Anesthesia Preprocedure Evaluation (Addendum)
Anesthesia Evaluation  Patient identified by MRN, date of birth, ID band Patient awake    Reviewed: Allergy & Precautions, H&P , NPO status , Patient's Chart, lab work & pertinent test results  Airway Mallampati: II  Neck ROM: full    Dental   Pulmonary sleep apnea ,          Cardiovascular hypertension,     Neuro/Psych    GI/Hepatic   Endo/Other  Diabetes mellitus-Morbid obesity  Renal/GU      Musculoskeletal   Abdominal   Peds  Hematology   Anesthesia Other Findings   Reproductive/Obstetrics                           Anesthesia Physical Anesthesia Plan  ASA: III  Anesthesia Plan: General   Post-op Pain Management:    Induction: Intravenous  Airway Management Planned: Oral ETT  Additional Equipment:   Intra-op Plan:   Post-operative Plan: Extubation in OR and Possible Post-op intubation/ventilation  Informed Consent: I have reviewed the patients History and Physical, chart, labs and discussed the procedure including the risks, benefits and alternatives for the proposed anesthesia with the patient or authorized representative who has indicated his/her understanding and acceptance.   Dental advisory given  Plan Discussed with: CRNA, Surgeon and Anesthesiologist  Anesthesia Plan Comments:        Anesthesia Quick Evaluation

## 2011-10-09 NOTE — ED Notes (Signed)
Pt continues to have nausea. PRN orders initiated.

## 2011-10-09 NOTE — ED Notes (Signed)
Patient started iron supplement several weeks ago and has not had a BM since Oct 02, 2011.  Patient states has used laxatives, fleets enemas and stool softeners with minimal results.  States her abdomen feels like it's going to blow up.

## 2011-10-09 NOTE — ED Notes (Signed)
Of note: pt received 2mg  IV dilaudid pta from [facility]

## 2011-10-09 NOTE — H&P (Signed)
See above.  Morbidly obese.  Looks ill but conversive cta ant Reg Obese, soft, diffusely ttp.  Ct, labs, chart reviewed.  Discussed findings with pt, husband, and sister. Given the amount of abdominal tenderness on exam, I donot think this can be managed with IV abx and a percutaneous drain. I have recommended surgical exploration with colon resection and probable ostomy.  I discussed the procedure in detail.    We discussed the risks and benefits of surgery including, but not limited to bleeding, infection (such as wound infection, abdominal abscess), injury to surrounding structures, blood clot formation, urinary retention, incisional hernia, anastomotic stricture, anastomotic leak, anesthesia risks, pulmonary & cardiac complications such as pneumonia &/or heart attack, need for additional procedures, ileus, & prolonged hospitalization.  We discussed the typical postoperative recovery course, including limitations & restrictions postoperatively. I explained that the likelihood of improvement in their symptoms is good.  I explained that she is at increased risk for complications given her morbid obesity, diabetes, and the fact that she is perforated.  She has agreed to proceed with surgery.  Cont ivf, iv abx Ostomy consult for marking To OR for exp lap  Mary Sella. Andrey Campanile, MD, FACS General, Bariatric, & Minimally Invasive Surgery [facility], [place]

## 2011-10-09 NOTE — Consult Note (Signed)
WOC consult for preoperative stoma site marking.  Requested per surgery to mark pt for end colostomy.  Pt with large abdomen, able to slightly feel rectus muscle.  I have evaluated pt in the sitting and lying position and marked her in the LLQ for end stoma.  Marked to avoid skin creases or belt line.  Marked abdomen at 7cm to the left of the umbilicus. Pt was able to visualize site while sitting up.  I have provided educational materials in the pt room for colostomy care and will follow up with pt after surgery for post op teaching and care.   WOC team will follow Thanks  Greysen Devino Eliberto Ivory RN, CWOCN [phone])

## 2011-10-09 NOTE — Progress Notes (Signed)
1610 Dr. Leticia Penna, general surgeon advisedd that patient does not want surgery at [facility]. She wants to go where there are a lot of surgeons. She has asked to speak with her gastroenterologist in [place], Dr. Jarold Motto for his recommendation. [phone] Patient has spoken with Dr. Jarold Motto. He recommended [facility]. Dr. Andrey Campanile is on call. [phone] Spoke with Dr. Andrey Campanile, general surgeon. He has accepted the patient in transfer to [facility]. He has requested that patient go to the ER. He will decide surgical course. 1000 Spoke with [facility] who will arrange for transport.

## 2011-10-10 ENCOUNTER — Other Ambulatory Visit: Payer: Self-pay

## 2011-10-10 ENCOUNTER — Inpatient Hospital Stay (HOSPITAL_COMMUNITY): Payer: Medicare Other

## 2011-10-10 DIAGNOSIS — J9600 Acute respiratory failure, unspecified whether with hypoxia or hypercapnia: Secondary | ICD-10-CM

## 2011-10-10 DIAGNOSIS — I959 Hypotension, unspecified: Secondary | ICD-10-CM

## 2011-10-10 DIAGNOSIS — K659 Peritonitis, unspecified: Secondary | ICD-10-CM

## 2011-10-10 DIAGNOSIS — I509 Heart failure, unspecified: Secondary | ICD-10-CM

## 2011-10-10 DIAGNOSIS — K5730 Diverticulosis of large intestine without perforation or abscess without bleeding: Secondary | ICD-10-CM

## 2011-10-10 LAB — CARDIAC PANEL(CRET KIN+CKTOT+MB+TROPI)
CK, MB: 4.3 ng/mL — ABNORMAL HIGH (ref 0.3–4.0)
CK, MB: 10 ng/mL (ref 0.3–4.0)
Total CK: 764 U/L — ABNORMAL HIGH (ref 7–177)
Total CK: 2087 U/L — ABNORMAL HIGH (ref 7–177)
Troponin I: <0.3 ng/mL (ref <0.30)

## 2011-10-10 LAB — CBC
HCT: 30 % — ABNORMAL LOW (ref 36.0–46.0)
Hemoglobin: 9.2 g/dL — ABNORMAL LOW (ref 12.0–15.0)
MCHC: 30.7 g/dL (ref 30.0–36.0)
MCV: 78.5 fL (ref 78.0–100.0)
RDW: 19.1 % — ABNORMAL HIGH (ref 11.5–15.5)

## 2011-10-10 LAB — POCT I-STAT 3, ART BLOOD GAS (G3+)
Bicarbonate: 20.2 mEq/L (ref 20.0–24.0)
Patient temperature: 99.3
TCO2: 21 mmol/L (ref 0–100)
pH, Arterial: 7.287 — ABNORMAL LOW (ref 7.350–7.400)
pO2, Arterial: 81 mmHg (ref 80.0–100.0)

## 2011-10-10 LAB — COMPREHENSIVE METABOLIC PANEL
BUN: 18 mg/dL (ref 6–23)
CO2: 20 mEq/L (ref 19–32)
Chloride: 106 mEq/L (ref 96–112)
Creatinine, Ser: 0.74 mg/dL (ref 0.50–1.10)
GFR calc Af Amer: >90 mL/min (ref >90)
GFR calc non Af Amer: 83 mL/min — ABNORMAL LOW (ref >90)
Glucose, Bld: 171 mg/dL — ABNORMAL HIGH (ref 70–99)
Total Bilirubin: 0.3 mg/dL (ref 0.3–1.2)

## 2011-10-10 LAB — GLUCOSE, CAPILLARY
Glucose-Capillary: 147 mg/dL — ABNORMAL HIGH (ref 70–99)
Glucose-Capillary: 150 mg/dL — ABNORMAL HIGH (ref 70–99)

## 2011-10-10 LAB — CULTURE, BLOOD (ROUTINE X 2)
Culture  Setup Time: 201301160912
Culture: NO GROWTH

## 2011-10-10 LAB — PHOSPHORUS: Phosphorus: 3.2 mg/dL (ref 2.3–4.6)

## 2011-10-10 MED ORDER — ONDANSETRON HCL 4 MG/2ML IJ SOLN: 4.0000 mg | Freq: Four times a day (QID) | INTRAMUSCULAR | Status: DC | PRN

## 2011-10-10 MED ORDER — FENTANYL CITRATE 0.05 MG/ML IJ SOLN: 12.5000 ug | INTRAMUSCULAR | Status: DC | PRN

## 2011-10-10 MED ORDER — DIPHENHYDRAMINE HCL 50 MG/ML IJ SOLN
12.5000 mg | Freq: Four times a day (QID) | INTRAMUSCULAR | Status: DC | PRN
  Administered 2011-10-16: 12.5 mg via INTRAVENOUS
  Filled 2011-10-10: qty 1

## 2011-10-10 MED ORDER — SODIUM CHLORIDE 0.9 % IJ SOLN
9.0000 mL | INTRAMUSCULAR | Status: DC | PRN
  Administered 2011-10-16 – 2011-10-23 (×10): 10 mL via INTRAVENOUS

## 2011-10-10 MED ORDER — NOREPINEPHRINE BITARTRATE 1 MG/ML IJ SOLN
2.0000 ug/min | INTRAVENOUS | Status: DC
  Administered 2011-10-10: 3 ug/min via INTRAVENOUS
  Filled 2011-10-10: qty 8

## 2011-10-10 MED ORDER — DIPHENHYDRAMINE HCL 12.5 MG/5ML PO ELIX
12.5000 mg | ORAL_SOLUTION | Freq: Four times a day (QID) | ORAL | Status: DC | PRN
  Filled 2011-10-10: qty 5

## 2011-10-10 MED ORDER — NOREPINEPHRINE BITARTRATE 1 MG/ML IJ SOLN
2.0000 ug/min | INTRAMUSCULAR | Status: DC
  Administered 2011-10-10: 11 ug/min via INTRAVENOUS
  Administered 2011-10-11: 10 ug/min via INTRAVENOUS
  Filled 2011-10-10 (×2): qty 8

## 2011-10-10 MED ORDER — PHENOL 1.4 % MT LIQD: 1.0000 | OROMUCOSAL | Status: DC | PRN

## 2011-10-10 MED ORDER — NALOXONE HCL 0.4 MG/ML IJ SOLN: 0.4000 mg | INTRAMUSCULAR | Status: DC | PRN

## 2011-10-10 MED ORDER — SODIUM CHLORIDE 0.9 % IV BOLUS (SEPSIS)
500.0000 mL | Freq: Once | INTRAVENOUS | Status: AC
  Administered 2011-10-10: 500 mL via INTRAVENOUS

## 2011-10-10 MED ORDER — MORPHINE SULFATE (PF) 1 MG/ML IV SOLN
INTRAVENOUS | Status: DC
  Administered 2011-10-10: 6 mg via INTRAVENOUS
  Administered 2011-10-10: 1 mg via INTRAVENOUS
  Administered 2011-10-10: 12 mg via INTRAVENOUS
  Administered 2011-10-11: 4 mg via INTRAVENOUS
  Administered 2011-10-11: 2.6 mg via INTRAVENOUS
  Administered 2011-10-11: 3 mg via INTRAVENOUS
  Administered 2011-10-11: 6 mg via INTRAVENOUS
  Administered 2011-10-11: 4 mg via INTRAVENOUS
  Administered 2011-10-11: 1 mg via INTRAVENOUS
  Administered 2011-10-12 (×2): 3 mg via INTRAVENOUS
  Administered 2011-10-12: 06:00:00 via INTRAVENOUS
  Administered 2011-10-12: 7 mg via INTRAVENOUS
  Administered 2011-10-12: 4 mg via INTRAVENOUS
  Administered 2011-10-12: 3 mg via INTRAVENOUS
  Administered 2011-10-12: 5 mg via INTRAVENOUS
  Administered 2011-10-13: 12.92 mg via INTRAVENOUS
  Administered 2011-10-13: 2 mg via INTRAVENOUS
  Administered 2011-10-13: 3 mg via INTRAVENOUS
  Administered 2011-10-13: 4 mg via INTRAVENOUS
  Administered 2011-10-13: 3 mg via INTRAVENOUS
  Administered 2011-10-13: 4 mg via INTRAVENOUS
  Administered 2011-10-14: 3 mg via INTRAVENOUS
  Administered 2011-10-14: 7 mg via INTRAVENOUS
  Administered 2011-10-14: 2 mg via INTRAVENOUS
  Administered 2011-10-14: 7 mg via INTRAVENOUS
  Administered 2011-10-14: 3 mg via INTRAVENOUS
  Administered 2011-10-15 (×2): 1 mg via INTRAVENOUS
  Administered 2011-10-15: 7 mg via INTRAVENOUS
  Administered 2011-10-15: 3 mg via INTRAVENOUS
  Administered 2011-10-15: 7 mg via INTRAVENOUS
  Administered 2011-10-15: 01:00:00 via INTRAVENOUS
  Administered 2011-10-16: 7.9 mg via INTRAVENOUS
  Administered 2011-10-16 (×2): 4 mg via INTRAVENOUS
  Administered 2011-10-16 (×2): 5 mg via INTRAVENOUS
  Administered 2011-10-16: 4 mg via INTRAVENOUS
  Administered 2011-10-16 – 2011-10-17 (×2): via INTRAVENOUS
  Administered 2011-10-17: 5.1 mg via INTRAVENOUS
  Administered 2011-10-17: 3 mg via INTRAVENOUS
  Administered 2011-10-17: 2 mL via INTRAVENOUS
  Filled 2011-10-10 (×10): qty 25

## 2011-10-10 NOTE — H&P (Signed)
Name: [name] MRN: [id number] DOB: 1941-09-10    LOS: 1  PCCM FOLLOWUP NOTE  History of Present Illness: [name] is a 71 y.o. Woman admitted as a transfer from Dr. Andrey Campanile (surgery) team.  Initially she presented to [facility]  with c/o progressive abd pain and constipation for 7 days. Her pain intensified over the past few days and she developed fever and nausea as well. She has had a BM, but it was small and loose. She denied hematochezia. Work up revealed elevated WBC and some free air on plain films, this prompted surgical eval and CT. CT done shows free air and sigmoid diverticulitis with a 6cm air/fluid abscess.  She has known hx of diverticulosis on prior colonoscopy, but no hx of diverticulitis requiring hospitalization and/or abx. Pt requested transfer to [facility]. Patient underwent emergent explorative laparotomy with sigmoid bowel resection and colostomy by Dr. Andrey Campanile on 10/08/10. She tolerated procedure without complications.  It was decided to continue to keep her on vent support overnight due to OSA and morbid obesity with plan to possibly extubate in am. Hence, PCCM assistance was requested for vent management.  Lines / Drains: L periph line 1/15>>> PICC line (triple lumen) 1/15>>> ETT 1/15>>> NGT 1/15>>> Colostomy 1/15>>>  Cultures: MRSA PCR neg 1/15 UA neg 1/15 BCx x2 1/15 (drawn after initiation of Abx Tx)>>  Antibiotics: Ertapenem 1/15>>>  Tests / Events: Acute Abdomen series XR 1/15>>>  free intra-abdominal air under the right  hemidiaphragm. Mild bibasilar airspace opacities likely reflect atelectasis;  borderline cardiomegaly. CT abdomen 1/15>>>Perforation of the sigmoid colon secondary to diverticulitis with  adjacent diverticular abscess and free air in the abdomen.  Cholelithiasis.  Evidence of chronic calcific pancreatitis. 10/09/11 Sigmoid colon resection and colostomy >>     SUBJ: Passed SBT this AM. Extubated and looks good. RASS 0. + F/C  Vital  Signs: Temp:  [98.2 F (36.8 C)-99.7 F (37.6 C)] 98.3 F (36.8 C) (01/16 1547) Pulse Rate:  [50-76] 66  (01/16 1530) Resp:  [12-35] 18  (01/16 1530) BP: (62-118)/(24-78) 87/28 mmHg (01/16 1530) SpO2:  [80 %-100 %] 92 % (01/16 1530) FiO2 (%):  [39.6 %-100 %] 40.3 % (01/16 1000) Weight:  [155.1 kg (341 lb 14.9 oz)] 155.1 kg (341 lb 14.9 oz) (01/16 0315) I/O last 3 completed shifts: In: 7267.3 [I.V.:6007.3; IV Piggyback:1260] Out: 1030 [Urine:580; Blood:450]  Physical Examination: General:  No distress Neuro: intact HEENT:  Pupils 3 mm PERRL bilaterally; no icterus Neck:  No JVD's no masses  Cardiovascular:  RRR, no MR Lungs:  ETT with few secretions, bilateral rhonchi worse on left Abdomen:  BS present; obese and slightly distended; soft; open wound midline with dressing and no exudate; stoma with minimal sanguinous discharge and no output otherwise. Musculoskeletal:  No effusions or joint deformities bilaterally. Skin:  Warm, no rashes. Left arm periph line wihtout erythema, edema, increased warmth or infiltration.  Ventilator settings: Vent Mode:  [-] Stand-by FiO2 (%):  [39.6 %-100 %] 40.3 % Set Rate:  [14 bmp-18 bmp] 18 bmp Vt Set:  [0 mL-460 mL] 0 mL PEEP:  [4.2 cmH20-5.1 cmH20] 5 cmH20 Pressure Support:  [8 cmH20] 8 cmH20 Plateau Pressure:  [18 cmH20-27 cmH20] 27 cmH20  Labs and Imaging:  Reviewed.  Please refer to the Assessment and Plan section for relevant results.  Assessment and Plan: NEUROLOGIC: AMS due to sedation/intubation. Resolved  PULMONARY: Hx of OSA  -Extubated now. Usual post op resp care implemented. nCPAP with sleep  CARDIOVASCULAR:  Wean pressors maintaining MAP > 65 mmHg  RENAL: Hyponatremia, mild. Euvolemic. Likely postoperative hyponatremia. -improved. Follow  Lab 10/10/11 0428 10/09/11 0551  NA 134* 131*  K 5.1 4.4  CL 106 97  CO2 20 26  GLUCOSE 171* 168*  BUN 18 14  CREATININE 0.74 0.84  CALCIUM 6.9* 9.0  MG 2.2 --  PHOS 3.2 --      GASTROINTESTINAL: 1. Sigmoid diverticulitis/abscess perforation sp colon resection and colostomy placement by Dr. Andrey Campanile -wound and stoma care per surgery team -NPO -NS IVF -follow stoma OU - on Invanz day #1 see ID section  2. Chronic pancreatitis  Per CT of abdomen -amylase -cmet in am -NPO -consider to check TG   HEMATOLOGIC: 1. Anemia sp colon resection.  No S:S of active bleed -follow H:H  2  Lab 10/10/11 0428 10/09/11 2315 10/09/11 0551  HGB 9.2* 9.4* 11.4*  HCT 30.0* 30.0* 36.0  WBC 21.9* 21.5* 27.4*  PLT 325 289 335     INFECTIOUS: Leukocytosis/Abdominal wound sp sigmoid diverticula abscess resection on 10/09/11 -Invanz day#1 -Bcx drawn but may not be helpful as were drawn after intuition of therapy with Invanz -wound care per surgery team   Lab 10/10/11 0428 10/09/11 2315 10/09/11 0551  WBC 21.9* 21.5* 27.4*   No results found for this basename: PROCALCITON:5 in the last 168 hours   ENDOCRINE: DM, type 2 -NPO sp abdominal surgery - ICU Hyperglycemia protocol -CBG's  Lab 10/10/11 1546 10/10/11 1143 10/10/11 0746 10/10/11 0349 10/10/11 0005  GLUCAP 151* 148* 150* 147* 133*    BEST PRACTICE / DISPOSITION: -->  ICU status under PCCM -->  Full code -->  NPO -->  Heparin Tucker for DVT Px -->  Protonix IV for GI Px -->  Ventilator bundle -->  Family updated at bedside   [name] 10/10/2011, 4:00 PM   [name]  Husband and pt updated @ bedside  Michelle Fischer, MD;  PCCM service; Mobile [phone]

## 2011-10-10 NOTE — Progress Notes (Signed)
1 Day Post-Op  Subjective: Alert on vent.  BP was down when I went in, but she just received Fentanyl.  Urine output still low, and appears concentrated (25ml last hour.) Husband says she has no history of heart disease or renal problems in past.Last CVP 19  Objective: Vital signs in last 24 hours: Temp:  [98.1 F (36.7 C)-99.7 F (37.6 C)] 99.7 F (37.6 C) (01/16 0804) Pulse Rate:  [50-88] 57  (01/16 0700) Resp:  [12-27] 27  (01/16 0700) BP: (62-133)/(32-78) 108/53 mmHg (01/16 0700) SpO2:  [79 %-100 %] 95 % (01/16 0700) FiO2 (%):  [39.6 %-100 %] 40.3 % (01/16 0700) Weight:  [135.7 kg (299 lb 2.6 oz)-155.1 kg (341 lb 14.9 oz)] 155.1 kg (341 lb 14.9 oz) (01/16 0315) Last BM Date: 10/02/11  Intake/Output from previous day: 01/15 0701 - 01/16 0700 In: 7267.3 [I.V.:6007.3; IV Piggyback:1260] Out: 1030 [Urine:580; Blood:450] Intake/Output this shift:    PE:  Alert on vent.  Chest, clear anteriorly, Card: SR she is not tachycardic HR high 50's no murmur or rub. Abd: large incision, open and clean.  No drainage from colostomy, except some bloody drainage,  Not much in the way of bowel sounds. Drain, is clear  & serous. Lab Results:   Basename 10/10/11 0428 10/09/11 2315  WBC 21.9* 21.5*  HGB 9.2* 9.4*  HCT 30.0* 30.0*  PLT 325 289    BMET  Basename 10/10/11 0428 10/09/11 0551  NA 134* 131*  K 5.1 4.4  CL 106 97  CO2 20 26  GLUCOSE 171* 168*  BUN 18 14  CREATININE 0.74 0.84  CALCIUM 6.9* 9.0   PT/INR No results found for this basename: LABPROT:2,INR:2 in the last 72 hours   Studies/Results: Ct Abdomen Pelvis W Contrast  10/09/2011  *RADIOLOGY REPORT*  Clinical Data: Free air in the abdomen.  CT ABDOMEN AND PELVIS WITH CONTRAST  Technique:  Multidetector CT imaging of the abdomen and pelvis was performed following the standard protocol during bolus administration of intravenous contrast.  Contrast: OMNIPAQUE IOHEXOL 300 MG/ML IV SOLN  Comparison: radiographs dated  10/09/2011  Findings: There is free air in the abdomen.  The patient has perforated sigmoid diverticulitis with a 6.2 cm air containing abscess in the left lower quadrant.  There is extensive diverticulosis of the distal descending and sigmoid portions of the colon.  There is also free air in the soft tissues of the mesentery adjacent to the perforation.  There is at least one small stone in the neck of the gallbladder. Gallbladder is not distended.  Liver, spleen, adrenal glands, and kidneys are normal.  There are multiple calcifications in the otherwise normal appearing pancreas consistent with chronic calcific pancreatitis.  No acute osseous abnormalities.  IMPRESSION: Perforation of the sigmoid colon secondary to diverticulitis with adjacent diverticular abscess and free air in the abdomen.  Cholelithiasis.  Evidence of chronic calcific pancreatitis.  Original Report Authenticated By: Gwynn Burly, M.D.   Dg Chest Port 1 View  10/09/2011  *RADIOLOGY REPORT*  Clinical Data: Colon resection, intubated, hypertension, diabetes  PORTABLE CHEST - 1 VIEW  Comparison: 01/23/2010, 10/09/2011  Findings: Endotracheal tube 2.5 cm above the carina.  Decreased lung volumes with cardiac enlargement, vascular congestion versus mild edema.  Left effusion not excluded.  No pneumothorax.  IMPRESSION: Endotracheal tube 2.5 cm above the carina.  Low lung volumes, cardiomegaly and mild central vascular congestion/edema.  Basilar atelectasis  Original Report Authenticated By: Judie Petit. Ruel Favors, M.D.   Dg Abd  Acute W/chest  10/09/2011  *RADIOLOGY REPORT*  Clinical Data: Constipation.  ACUTE ABDOMEN SERIES (ABDOMEN 2 VIEW & CHEST 1 VIEW)  Comparison: Chest radiograph performed 01/23/2010, and lumbar spine radiograph performed 08/31/2005  Findings: The lungs are well-aerated.  Mild bibasilar airspace opacities likely reflect atelectasis.  There is no evidence of pleural effusion or pneumothorax.  The cardiomediastinal silhouette  is borderline enlarged.  There is question of a small amount of air under the right hemidiaphragm.  The visualized bowel gas pattern is unremarkable.  Fluid and air are noted throughout the colon; there is no evidence of small bowel dilatation to suggest obstruction.  No acute osseous abnormalities are seen; the sacroiliac joints are unremarkable in appearance.  IMPRESSION:  1.  Question of free intra-abdominal air under the right hemidiaphragm. 2.  Otherwise unremarkable bowel gas pattern; no evidence for obstruction. 3.  Mild bibasilar airspace opacities likely reflect atelectasis; borderline cardiomegaly.  Critical Value/emergent results were called by telephone at the time of interpretation on 10/09/2011  at 05:20 a.m.  to  Dr. Brock Bad, who verbally acknowledged these results.  Original Report Authenticated By: Tonia Ghent, M.D.    Anti-infectives: Anti-infectives     Start     Dose/Rate Route Frequency Ordered Stop   10/10/11 0700   ertapenem (INVANZ) 1 g in sodium chloride 0.9 % 50 mL IVPB  Status:  Discontinued        1 g 100 mL/hr over 30 Minutes Intravenous Every 24 hours 10/09/11 1336 10/09/11 2022   10/09/11 2200   ertapenem (INVANZ) 1 g in sodium chloride 0.9 % 50 mL IVPB        1 g 100 mL/hr over 30 Minutes Intravenous Every 24 hours 10/09/11 2022     10/09/11 0730   ertapenem (INVANZ) 1 g in sodium chloride 0.9 % 50 mL IVPB        1 g 100 mL/hr over 30 Minutes Intravenous  Once 10/09/11 0715 10/09/11 0842         Current Facility-Administered Medications  Medication Dose Route Frequency Provider Last Rate Last Dose  . 0.9 %  sodium chloride infusion  250 mL Intravenous PRN Deatra Robinson      . 0.9 %  sodium chloride infusion   Intravenous Continuous Nodira Karimova 125 mL/hr at 10/09/11 2000    . aspirin suppository 300 mg  300 mg Rectal NOW Nodira Karimova   300 mg at 10/09/11 2200  . dexmedetomidine (PRECEDEX) 400 mcg in sodium chloride 0.9 % 100 mL infusion   0.4-1.2 mcg/kg/hr Intravenous Titrated Benny Lennert, PHARMD 23.7 mL/hr at 10/10/11 0501 0.7 mcg/kg/hr at 10/10/11 0501  . ertapenem (INVANZ) 1 g in sodium chloride 0.9 % 50 mL IVPB  1 g Intravenous Once Carlisle Beers Molpus, MD   1 g at 10/09/11 0729  . ertapenem (INVANZ) 1 g in sodium chloride 0.9 % 50 mL IVPB  1 g Intravenous Q24H Atilano Ina, MD   1 g at 10/10/11 0003  . fentaNYL (SUBLIMAZE) injection 25-50 mcg  25-50 mcg Intravenous Q2H PRN Nodira Karimova   50 mcg at 10/10/11 0319  . heparin injection 5,000 Units  5,000 Units Subcutaneous Q8H Atilano Ina, MD      . insulin aspart (novoLOG) injection 0-4 Units  0-4 Units Subcutaneous Q4H Nodira Karimova   1 Units at 10/10/11 0800  . midazolam (VERSED) injection 1-2 mg  1-2 mg Intravenous Q1H PRN Nodira Karimova   2 mg at 10/10/11 0212  .  norepinephrine (LEVOPHED) 8,000 mcg in dextrose 5 % 250 mL infusion  2-50 mcg/min Intravenous Continuous Elizabeth Deterding 11.3 mL/hr at 10/10/11 0700 6 mcg/min at 10/10/11 0700  . ondansetron (ZOFRAN) injection 4 mg  4 mg Intravenous Once Marianna Fuss, PA   4 mg at 10/09/11 1151  . ondansetron (ZOFRAN) injection 4 mg  4 mg Intravenous Q6H PRN Atilano Ina, MD      . pantoprazole (PROTONIX) injection 40 mg  40 mg Intravenous QHS Nodira Karimova   40 mg at 10/10/11 0003  . promethazine (PHENERGAN) injection 12.5 mg  12.5 mg Intravenous Once Raiford Simmonds, MD   25 mg at 10/09/11 1421  . sodium chloride 0.9 % bolus 1,000 mL  1,000 mL Intravenous Once Rakesh V. Vassie Loll, MD   1,000 mL at 10/09/11 2345  . sodium chloride 0.9 % injection 10 mL  10 mL Intracatheter PRN Atilano Ina, MD      . DISCONTD: 0.45 % NaCl with KCl 20 mEq / L infusion   Intravenous Continuous Atilano Ina, MD      . DISCONTD: 0.9 %  sodium chloride infusion   Intravenous Continuous Carlisle Beers Molpus, MD      . DISCONTD: 0.9 % irrigation (POUR BTL)    PRN Atilano Ina, MD   1,000 mL at 10/09/11 1653  . DISCONTD: acetaminophen (TYLENOL)  suppository 650 mg  650 mg Rectal Q6H PRN Marianna Fuss, PA      . DISCONTD: acetaminophen (TYLENOL) tablet 650 mg  650 mg Oral Q6H PRN Marianna Fuss, PA      . DISCONTD: antiseptic oral rinse (BIOTENE) solution 15 mL  15 mL Mouth Rinse BID Atilano Ina, MD   15 mL at 10/09/11 2000  . DISCONTD: aspirin chewable tablet 324 mg  324 mg Oral NOW Deatra Robinson      . DISCONTD: dexmedetomidine (PRECEDEX) 200 mcg in sodium chloride 0.9 % 50 mL infusion  0.4-1.2 mcg/kg/hr Intravenous Titrated Joaquin Courts, MD      . DISCONTD: dexmedetomidine (PRECEDEX) 200 mcg in sodium chloride 0.9 % 50 mL infusion  0.2-1.2 mcg/kg/hr Intravenous Continuous Nodira Karimova 23.7 mL/hr at 10/09/11 2000 0.7 mcg/kg/hr at 10/09/11 2000  . DISCONTD: dexmedetomidine (PRECEDEX) 200 mcg in sodium chloride 0.9 % 50 mL infusion  0.2-0.7 mcg/kg/hr Intravenous Continuous Deatra Robinson      . DISCONTD: dexmedetomidine (PRECEDEX) 200 mcg in sodium chloride 0.9 % 50 mL infusion  0.2-0.7 mcg/kg/hr Intravenous Continuous Nodira Karimova 33.9 mL/hr at 10/09/11 2100 1 mcg/kg/hr at 10/09/11 2100  . DISCONTD: dextrose 10 % infusion   Intravenous Continuous Deatra Robinson      . DISCONTD: diphenhydrAMINE (BENADRYL) 12.5 MG/5ML elixir 12.5 mg  12.5 mg Oral Q6H PRN Marianna Fuss, PA      . DISCONTD: diphenhydrAMINE (BENADRYL) injection 12.5 mg  12.5 mg Intravenous Q6H PRN Marianna Fuss, PA      . DISCONTD: ertapenem (INVANZ) 1 g in sodium chloride 0.9 % 50 mL IVPB  1 g Intravenous Q24H Marianna Fuss, PA      . DISCONTD: fentaNYL (SUBLIMAZE) 10 mcg/mL in sodium chloride 0.9 % 250 mL infusion  50-300 mcg/hr Intravenous Titrated Atilano Ina, MD      . DISCONTD: fentaNYL (SUBLIMAZE) 10 mcg/mL in sodium chloride 0.9 % 250 mL infusion  50-300 mcg/hr Intravenous Titrated Deatra Robinson      . DISCONTD: fentaNYL (SUBLIMAZE) bolus via infusion 50-100 mcg  50-100 mcg Intravenous Q6H  PRN Atilano Ina, MD      . DISCONTD: fentaNYL  (SUBLIMAZE) bolus via infusion 50-100 mcg  50-100 mcg Intravenous Q6H PRN Deatra Robinson      . DISCONTD: heparin injection 5,000 Units  5,000 Units Subcutaneous Q8H Deatra Robinson      . DISCONTD: HYDROmorphone (DILAUDID) injection 1-2 mg  1-2 mg Intravenous Q3H PRN Marianna Fuss, PA      . DISCONTD: insulin aspart (novoLOG) injection 0-20 Units  0-20 Units Subcutaneous TID WC Marianna Fuss, PA      . DISCONTD: insulin aspart (novoLOG) injection 0-20 Units  0-20 Units Subcutaneous TID WC Atilano Ina, MD      . DISCONTD: insulin aspart (novoLOG) injection 0-5 Units  0-5 Units Subcutaneous QHS Atilano Ina, MD      . DISCONTD: insulin aspart (novoLOG) injection 3 Units  3 Units Subcutaneous Q4H Deatra Robinson      . DISCONTD: lactated ringers infusion   Intravenous Continuous Raiford Simmonds, MD 50 mL/hr at 10/09/11 1414    . DISCONTD: midazolam (VERSED) injection 2-4 mg  2-4 mg Intravenous Q2H PRN Deatra Robinson      . DISCONTD: ondansetron (ZOFRAN) injection 4 mg  4 mg Intravenous Q6H PRN Marianna Fuss, PA   4 mg at 10/09/11 1303  . DISCONTD: ondansetron (ZOFRAN) tablet 4 mg  4 mg Oral Q6H PRN Atilano Ina, MD      . DISCONTD: pantoprazole (PROTONIX) injection 40 mg  40 mg Intravenous QHS Marianna Fuss, PA      . DISCONTD: propofol (DIPRIVAN) 10 MG/ML infusion  5-50 mcg/kg/min Intravenous Titrated Atilano Ina, MD       Facility-Administered Medications Ordered in Other Encounters  Medication Dose Route Frequency Provider Last Rate Last Dose  . DISCONTD: 0.9 %  sodium chloride infusion    Continuous PRN Andree Elk, CRNA      . DISCONTD: dexmedetomidine (PRECEDEX) 200 mcg in sodium chloride 0.9 % 50 mL infusion  200 mcg  Continuous PRN Andree Elk, CRNA 23.7 mL/hr at 10/09/11 1930 0.7 mcg/kg/hr at 10/09/11 1930  . DISCONTD: fentaNYL (SUBLIMAZE) injection    PRN Andree Elk, CRNA   100 mcg at 10/09/11 1950  . DISCONTD: hetastarch in lactated electrolyte  (HEXTEND) 6 % infusion    Continuous PRN Andree Elk, CRNA      . DISCONTD: lactated ringers infusion    Continuous PRN Andree Elk, CRNA      . DISCONTD: midazolam (VERSED) 5 MG/5ML injection    PRN Andree Elk, CRNA   2 mg at 10/09/11 1950  . DISCONTD: propofol (DIPRIVAN) 10 MG/ML infusion    PRN Andree Elk, CRNA   160 mg at 10/09/11 1537  . DISCONTD: rocuronium (ZEMURON) injection    PRN Andree Elk, CRNA   10 mg at 10/09/11 1753  . DISCONTD: succinylcholine (ANECTINE) injection    PRN Andree Elk, CRNA   100 mg at 10/09/11 1537    Assessment/Plan Perforated Sigmoid diverticulitis, with exploratory lap, Sigmoid colectomy, end colostomy. Post op hypotension,olguria, hypotension improving on low dose norepinephrine, and volume replacement Acidosis  PH7.28  (0432 this AM) Sepsis (WBC 21.9) Hypertension by history Diabetes Arthritis Morbid Obesity BMI 55 Sleep Apnea with BiPap at home.  Plan:  Vent per CCM, she is on levophed, still concerned about U/O, hypotension,  PH, and volume issues.  I will give her some extra saline now even though  CVP is better. Continue antibiotics, vent, and acidosis per CCM.       LOS: 1 day    Shaughn Thomley 10/10/2011

## 2011-10-10 NOTE — Progress Notes (Signed)
CRITICAL VALUE ALERT  Critical value received: MB 10   Date of notification: 10/10/2011   Time of notification:  1100  Critical value read back:yes  Nurse who received alert:  Jani Gravel  MD notified (1st page):  MD Andrey Campanile  Time of first page:  15:44  MD notified (2nd page):  Time of second page:  Responding MD:  MD Andrey Campanile  Time MD responded:  15:48

## 2011-10-10 NOTE — Progress Notes (Signed)
Placed pt on home cpap for a nap. 12cm h20 per pt. Bleed in 6L 02 Christie Beckers

## 2011-10-10 NOTE — Progress Notes (Signed)
eLink Physician-Brief Progress Note Patient Name: [name] DOB: [birth year] MRN: [id number]  Date of Service  10/10/2011   HPI/Events of Note  S/P exp lap with sigmoid colecotmy and colostomy - now with hypotension in the setting of CVP of 16.  Has received fluid bolus earlier with no improvement in BP.  Current BP of 85/50 (59).  Oliguria as well   eICU Interventions  Plan: Start NE for BP support esp since patient now bradycardiac on precedex gtt.   Intervention Category Major Interventions: Hypotension - evaluation and management  DETERDING,ELIZABETH 10/10/2011, 1:12 AM

## 2011-10-10 NOTE — Progress Notes (Signed)
Extubated. On levo. cvp 18 ?accuracy.  Alert, nad reg cta ant with decreased bs at bases Obese, soft, ostomy- hyperemic, some blood in bag. No air  PCA Ice chips Cont ng today Cont foley to monitor uop Wean levo for map>60 w-d bid to midline Start VTE prophy today  I saw the patient, participated in the history, exam and medical decision making, and concur with the physician assistant's note above.  Mary Sella. Andrey Campanile, MD, FACS General, Bariatric, & Minimally Invasive Surgery [facility], [place]

## 2011-10-10 NOTE — Op Note (Signed)
[name], Goodwin NO.:  [id number]  MEDICAL RECORD NO.:  [id number]  LOCATION:  2311                         FACILITY:  [facility]  PHYSICIAN:  Mary Sella. Andrey Campanile, MD     DATE OF BIRTH:  1941-01-10  DATE OF PROCEDURE:  10/09/2011 DATE OF DISCHARGE:                              OPERATIVE REPORT   PREOPERATIVE DIAGNOSIS:  Perforated sigmoid diverticulitis.  POSTOPERATIVE DIAGNOSIS:  Perforated sigmoid diverticulitis.  PROCEDURE:  Exploratory laparotomy, sigmoid colectomy with end colostomy.  SURGEON:  Mary Sella. Andrey Campanile, MD, FACS.  ASSISTANT SURGEON: 1. Gabrielle Dare Janee Morn, MD, FACS. 2. Wilmon Arms. Tsuei, MD, FACS.  ANESTHESIA:  General.  SPECIMEN:  Sigmoid colon stitch marks distal margin.  COMPLICATIONS:  None immediately apparent.  FINDINGS:  The patient had a focal segment of diverticulitis in her upper sigmoid colon.  It was perforated with an abscess cavity.  Because of her obesity, diabetes mellitus, and for the perforation, I elected to divert her.  The rectal stump was tagged with two 2-0 Prolene sutures. The ostomy was brought up through her upper left midabdomen. A 22 modifier should be applied to this case because it took an additional hour to perform this case because of the patient's extreme obesity.   INDICATIONS FOR PROCEDURE:  The patient is a morbidly obese 71 year old Caucasian female who has been having abdominal pain for about a week. She has also had constipation as well.  Her pain worsened this morning as well as developing a fever.  She presented to [name] where a CT scan was performed, which demonstrated perforated sigmoid diverticulitis.  She requested to be transferred to [name] after conferring with her local GI medicine physician.  She had a white count of around 28,000.  On the CT scan, she had free air in her pelvis as well as some free air in her upper abdomen around the liver, and she had a 6-cm phlegmon in her pelvis.   She had diffuse abdominal tenderness.  I did not think that she would be best managed by percutaneous drain.  However, we did discuss that.  My recommendation was to take her to the operating room for a sigmoid colectomy with an ostomy creation based on her physical exam.  We discussed the risks and benefits at length including but not limited to, bleeding, infection, injury to surrounding structures, incisional hernia formation, abscess formation, ostomy problems, blood clot formation, injury to surrounding structures, fascial dehiscence, postoperative pneumonia, cardiac complications, prolonged ileus, need for additional procedures.  I did explain to her that she is at higher risk for complications given her diabetes mellitus and morbid obesity.  She elected to proceed with surgery.  DESCRIPTION OF PROCEDURE:  The patient was taken urgently to the operating room, placed supine on the operating room table.  General endotracheal anesthesia was established.  A Foley catheter was placed. Sequential compression devices were placed.  She was placed in lithotomy position with appropriate padding.  Her abdomen was prepped in the usual standard surgical fashion, and her perineum was prepped with Betadine. She had already received Invanz prior to skin incision.  Her abdominal wall had been marked by  the ostomy nurse for planned ostomy.  Initially, a lower midline incision was made with a #10 blade and carried down to her pubis.  Her abdominal wall was extremely thick.  She had approximately 8 inches of subcutaneous fat.  We ended up extending the incision above her umbilicus.  I was able to enter the abdominal cavity. Due to just the sheer size of her abdominal wall in her abdomen, I ended up extending the incision several inches above her umbilicus to her upper midline.  Initially, a self-retaining retractor system was placed, and we ended up using a Bookwalter to help with retraction.  There  was a segment of small bowel that was adhered down in her pelvis to the vaginal cuff as well as to the segment of the sigmoid colon.  I was able to free it using a combination of blunt dissection as well with Metzenbaum scissors.  It was inspected.  Once it was freed, there was no evidence of serosal tear.  At this point, I started to take down the white line of Toldt on the left side.  I was able to get into the abscess cavity. There was a large inflammatory rind around this abscess cavity.  I found a focal area of the colon that had the diverticulitis.  I elected to go ahead and transect it about 3 inches above this area where the colon felt normal and was not indurated or thickened.  A small defect was made in the mesentery just next to the bowel wall, and the colon was transected with a 75-mm GIA linear cutter stapler blue load.  The mesocolon was then taken down in a serial fashion using the LigaSure. Upon further palpation of the lower sigmoid colon and upper rectum, it became quite apparent that some of it was adhered down further into the pelvis.  I was able to finger fracture, which released the segment of the colon, which I was able to pull up the colon quite easily.  I continued to take down the mesocolon staying very close to the colon wall in order to avoid the ureter.  We were able to dissect distal to the area of inflammation in the sigmoid.  I made again a small defect in the mesentery just next to the colon wall.  Then using a contour stapler with a green load, I fired across the distal sigmoid freeing the specimen.  Suture was used to mark the distal margin of the sigmoid colon.  The distal sigmoid and upper rectum were quite soft.  It did not appear to have active diverticulitis in that area.  She has a very long rectal stump.  It was tacked with two 2-0 Prolene sutures.  At this point, I began to mobilize some of the descending colon, so that I could bring it up  through the abdominal wall for the ostomy.  Additional lateral attachments were taken down with Bovie electrocautery as well as with LigaSure device.  We felt that we had achieved adequate length.  A Kocher was placed on the left midabdomen wall where the patient had been marked on her skin several inches away from the midline.  A circular defect was made with electrocautery in the skin.  Some of the subcutaneous tissue was excised as well.  We ended up using thin malleables for retraction to get down to her fascia.  A cruciate incision was made in the fascia in the abdominal wall so that 2 fingers could easily slide through it.  Again, width of her abdominal wall was minimum of 8 inches.  We initially brought the [facility] through the ostomy defect and used it grab the stapled end of the descending colon. It was still too bulky to come out through the fascial defect.  I ended up trimming off some of the epiploic appendages with the LigaSure device.  Eventually, we were able to bring it up through the abdominal wall.  It was not under tension.  We ensured that it was not twisted. At this point, we copiously irrigated the abdominal cavity with 6 L of saline.  I reinspected the area of the small bowel that had been adhered to the pelvis.  It appeared normal.  There was no evidence of a serosal tear.  There was just an area of inflammatory rind, which I left alone. I placed a piece of Seprafilm around the ostomy within the abdominal cavity.  We then started closing the fascia with two #1 looped PDS sutures with interrupted internal #1 Novafil sutures as internal retention sutures.  Some additional Seprafilm was placed.  The fascia was well closed.  The skin was left open, and the midline incision was packed with a Kerlix.  At this point, we matured the ostomy.  About 1 cm of the end of the colon, staple line was excised.  It was then everted with interrupted 3-0 Vicryl sutures to the deep  dermis in a circular fashion.  An ostomy device was placed.  The 4x4s and ABD pad was placed over the midline incision.  The patient was left intubated and transferred to recovery room in stable condition.  She will remain intubated overnight and with plans to wean her for extubation in the morning.  All needle, instrument, and sponge counts were correct x2.  There are no immediate complications.  It should be noted that approximately an additional hour was added to this case because of the patient's extreme obesity. It made it challenging to retract her small bowel and mesentery out of the operative field, create and bring up her end ostomy, and to finally close her fascia.     Mary Sella. Andrey Campanile, MD FACS     EMW/MEDQ  D:  10/09/2011  T:  10/09/2011  Job:  [id number]

## 2011-10-10 NOTE — Clinical Documentation Improvement (Signed)
CHANGE MENTAL STATUS DOCUMENTATION CLARIFICATION   THIS DOCUMENT IS NOT A PERMANENT PART OF THE MEDICAL RECORD  TO RESPOND TO THE THIS QUERY, FOLLOW THE INSTRUCTIONS BELOW:  1. If needed, update documentation for the patient's encounter via the notes activity.  2. Access this query again and click edit on the In [employer].  3. After updating, or not, click F2 to complete all highlighted (required) fields concerning your review. Select "additional documentation in the medical record" OR "no additional documentation provided".  4. Click Sign note button.  5. The deficiency will fall out of your In Basket *Please let us know if you are not able to complete this workflow by phone or e-mail (listed below).         10/10/11  Dear Dr. Andrey Campanile Marton Redwood  In an effort to better capture your patient's severity of illness, reflect appropriate length of stay and utilization of resources, a review of the patient medical record has revealed the following indicators.    Based on your clinical judgment, please clarify and document in a progress note and/or discharge summary the clinical condition associated with the following supporting information:   Possible Clinical Conditions?  _______Encephalopathy (describe type if known)                       Anoxic                       Septic                       Hepatic                       Hypertensive                       Metabolic                       Toxic  _______Drug induced confusion/delirium _______Acute confusion _______Acute delirium _______Acute exacerbation of known dementia (indicate type) _______New diagnosis of Dementia, Alzheimer's, cerebral atherosclerosis _______Poisoning / Overdose _______Other Condition__________________ _______Cannot Clinically Determine    Supporting Information:  Signs & Symptoms:  Per 1/15 progress note, AMS due to sedation/intubation,agitation.   Reviewed: No provider response.  Thank  You,  Marciano Sequin,  Clinical Documentation Specialist:  Pager: [phone]  Health Information Management [place]

## 2011-10-10 NOTE — Progress Notes (Signed)
UR of chart completed.  

## 2011-10-10 NOTE — Procedures (Signed)
Extubation Procedure Note  Patient Details:   Name: [name] DOB: November 26, 1940 MRN: [id number]   Airway Documentation:   Pt extubated to 4L [place]. Mild stridor noted--MD aware. Pt has strong productive cough. Pt able to vocalize. Positive cuff leak. Moderate exp whz.  Evaluation  O2 sats: stable throughout and currently acceptable Complications: No apparent complications Patient did tolerate procedure well. Bilateral Breath Sounds: Rhonchi Suctioning: Airway   Christie Beckers 10/10/2011, 10:24 AM

## 2011-10-10 NOTE — Consult Note (Signed)
WOC ostomy consult  Stoma type/location: LLQ, end colostomy Stomal assessment/size: aprox. 1 1/2" round, pink and moist-visualized thru pouch today Peristomal assessment: did not changed pouch today Output:  Bloody drainage only at current time  Ostomy pouching: 2pc in place will order 1pc and barrier rings for bedside for next change as appears that stoma may be more flush with the skin..  Education provided: Spoke with family at bedside, pt remains intubated this am.  Explained role of WOC nurse to family and that we would follow pt for further education and pouching needs.  WOC team will follow Thanks  Mackenzee Becvar Eliberto Ivory RN, CWOCN [phone])

## 2011-10-11 ENCOUNTER — Inpatient Hospital Stay (HOSPITAL_COMMUNITY): Payer: Medicare Other

## 2011-10-11 ENCOUNTER — Encounter (HOSPITAL_COMMUNITY): Payer: Self-pay | Admitting: General Surgery

## 2011-10-11 DIAGNOSIS — K5730 Diverticulosis of large intestine without perforation or abscess without bleeding: Secondary | ICD-10-CM

## 2011-10-11 DIAGNOSIS — J9600 Acute respiratory failure, unspecified whether with hypoxia or hypercapnia: Secondary | ICD-10-CM

## 2011-10-11 DIAGNOSIS — I509 Heart failure, unspecified: Secondary | ICD-10-CM

## 2011-10-11 DIAGNOSIS — K659 Peritonitis, unspecified: Secondary | ICD-10-CM

## 2011-10-11 LAB — BASIC METABOLIC PANEL
BUN: 17 mg/dL (ref 6–23)
CO2: 24 mEq/L (ref 19–32)
Chloride: 106 mEq/L (ref 96–112)
GFR calc Af Amer: >90 mL/min (ref >90)
Potassium: 4.4 mEq/L (ref 3.5–5.1)

## 2011-10-11 LAB — POCT I-STAT 4, (NA,K, GLUC, HGB,HCT)
HCT: 42 % (ref 36.0–46.0)
Hemoglobin: 14.3 g/dL (ref 12.0–15.0)

## 2011-10-11 LAB — GLUCOSE, CAPILLARY
Glucose-Capillary: 137 mg/dL — ABNORMAL HIGH (ref 70–99)
Glucose-Capillary: 119 mg/dL — ABNORMAL HIGH (ref 70–99)

## 2011-10-11 LAB — CARDIAC PANEL(CRET KIN+CKTOT+MB+TROPI)
CK, MB: 8.3 ng/mL (ref 0.3–4.0)
Relative Index: 0.3 (ref 0.0–2.5)
Total CK: 2586 U/L — ABNORMAL HIGH (ref 7–177)
Troponin I: <0.3 ng/mL (ref <0.30)

## 2011-10-11 LAB — CBC
HCT: 29.8 % — ABNORMAL LOW (ref 36.0–46.0)
Hemoglobin: 9.5 g/dL — ABNORMAL LOW (ref 12.0–15.0)
MCHC: 31.9 g/dL (ref 30.0–36.0)
MCV: 79.3 fL (ref 78.0–100.0)
WBC: 26.7 10*3/uL — ABNORMAL HIGH (ref 4.0–10.5)

## 2011-10-11 LAB — DIFFERENTIAL
Eosinophils Relative: 0 % (ref 0–5)
Lymphocytes Relative: 9 % — ABNORMAL LOW (ref 12–46)
Monocytes Absolute: 2 10*3/uL — ABNORMAL HIGH (ref 0.1–1.0)
Monocytes Relative: 8 % (ref 3–12)
Neutro Abs: 22.3 10*3/uL — ABNORMAL HIGH (ref 1.7–7.7)

## 2011-10-11 NOTE — Progress Notes (Signed)
Pt placed on home cpap at this time with 6L O2 bleed in.  Jacqulynn Cadet RRT

## 2011-10-11 NOTE — Progress Notes (Signed)
Pain controlled. abd obese, soft, ostomy - hyperemic, some edema, blood tinged. No air in bag.   Cont subcu heparin Cont NG tube today w-d dressing bid Wean Levo for MAP>65 Invanx for perf sigmoid diverticulitis CPAP at night for OSA pulm toilet  Mary Sella. Andrey Campanile, MD, FACS General, Bariatric, & Minimally Invasive Surgery [facility], [place]

## 2011-10-11 NOTE — Consult Note (Signed)
WOC ostomy follow up Pt extubated today, we discussed her ostomy care but currently she does not have any output other than some blood.  I have made sure supplies are at the bedside for pouch change should the bedside nurse need them.  I believe that based on the size and stoma she may benefit from transition to a flat one pc pouch with use of 2" barrier ring around the stoma to create some convexity.   WOC will follow [name] Marlena Clipper, [place] [phone]

## 2011-10-11 NOTE — Progress Notes (Addendum)
Name: [name] MRN: [id number] DOB: 1940-09-28    LOS: 2  PCCM FOLLOWUP NOTE  History of Present Illness: [name] is a 71 y.o. Woman admitted as a transfer from Dr. Andrey Campanile (surgery) team.  Initially she presented to [facility]  with c/o progressive abd pain and constipation for 7 days. Her pain intensified over the past few days and she developed fever and nausea as well. She has had a BM, but it was small and loose. She denied hematochezia. Work up revealed elevated WBC and some free air on plain films, this prompted surgical eval and CT. CT done shows free air and sigmoid diverticulitis with a 6cm air/fluid abscess.  She has known hx of diverticulosis on prior colonoscopy, but no hx of diverticulitis requiring hospitalization and/or abx. Pt requested transfer to [facility]. Patient underwent emergent explorative laparotomy with sigmoid bowel resection and colostomy by Dr. Andrey Campanile on 10/08/10. She tolerated procedure without complications.  It was decided to continue to keep her on vent support overnight due to OSA and morbid obesity with plan to possibly extubate in am. Hence, PCCM assistance was requested for vent management.  Lines / Drains: L periph line 1/15>>> PICC line (triple lumen) 1/15>>> ETT 1/15>>>1/16 NGT 1/15>>> Colostomy 1/15>>>  Cultures: MRSA PCR neg 1/15 UA neg 1/15 BCx x2 1/15 (drawn after initiation of Abx Tx)>>  Antibiotics: Ertapenem 1/15>>>  Tests / Events: Portable Chest Xray In Am  10/10/2011  *RADIOLOGY REPORT*  Clinical Data: Sepsis  PORTABLE CHEST - 1 VIEW  Comparison: October 09, 2011  Findings: The ET tube tip remains in good position, well above the carina.  The right upper extremity PICC line tip is at the cavoatrial junction.  The NG tube tip courses off the inferior film.  There is a shallow inspiration which accentuates lung markings.  Cardiomegaly and pulmonary vascular cephalization are present without frank edema.  Left basilar atelectasis versus  infiltrate does not appear significantly changed.  IMPRESSION: Cardiomegaly and pulmonary vascular cephalization without frank edema, unchanged.  No change in appearance of left basilar infiltrate versus atelectasis.  Original Report Authenticated By: Brandon Melnick, M.D.   Dg Chest Port 1 View  10/09/2011  *RADIOLOGY REPORT*  Clinical Data: Colon resection, intubated, hypertension, diabetes  PORTABLE CHEST - 1 VIEW  Comparison: 01/23/2010, 10/09/2011  Findings: Endotracheal tube 2.5 cm above the carina.  Decreased lung volumes with cardiac enlargement, vascular congestion versus mild edema.  Left effusion not excluded.  No pneumothorax.  IMPRESSION: Endotracheal tube 2.5 cm above the carina.  Low lung volumes, cardiomegaly and mild central vascular congestion/edema.  Basilar atelectasis  Original Report Authenticated By: Judie Petit. Ruel Favors, M.D.      SUBJ: awake and alert  Vital Signs: Temp:  [97.9 F (36.6 C)-100.6 F (38.1 C)] 97.9 F (36.6 C) (01/17 0751) Pulse Rate:  [56-97] 77  (01/17 0930) Resp:  [13-26] 18  (01/17 0930) BP: (68-126)/(23-110) 112/48 mmHg (01/17 0930) SpO2:  [83 %-95 %] 91 % (01/17 0930) Weight:  [316 lb 9.3 oz (143.6 kg)] 316 lb 9.3 oz (143.6 kg) (01/17 0500) I/O last 3 completed shifts: In: 5617 [I.V.:4297; IV Piggyback:1320] Out: 2255 [Urine:1255; Emesis/NG output:1000]  Physical Examination: General:  No distress Neuro: intact HEENT:  Pupils 3 mm PERRL bilaterally; no icterus Neck:  No JVD's no masses  Cardiovascular:  RRR, no MR Lungs:  ETT with few secretions, bilateral rhonchi worse on left Abdomen:  BS present; obese and slightly distended; soft; open wound midline with  dressing and no exudate; stoma with minimal sanguinous discharge and no output otherwise.v NGT LWIS with green bile Musculoskeletal:  No effusions or joint deformities bilaterally. Skin:  Warm, no rashes. Left arm periph line wihtout erythema, edema, increased warmth or  infiltration.  Ventilator settings:    Labs and Imaging:    Lab 10/11/11 0405 10/10/11 0428 10/09/11 0551  NA 136 134* 131*  K 4.4 5.1 4.4  CL 106 106 97  CO2 24 20 26   BUN 17 18 14   CREATININE 0.61 0.74 0.84  GLUCOSE 151* 171* 168*    Lab 10/11/11 0405 10/10/11 0428 10/09/11 2315  HGB 9.5* 9.2* 9.4*  HCT 29.8* 30.0* 30.0*  WBC 26.7* 21.9* 21.5*  PLT 378 325 289    Assessment and Plan: NEUROLOGIC: AMS due to sedation/intubation. Resolved  PULMONARY: Hx of OSA  -Extubated now. Usual post op resp care implemented. nCPAP with sleep  CARDIOVASCULAR: Wean pressors maintaining MAP > 65 mmHg  RENAL: Hyponatremia, mild. Euvolemic. Likely postoperative hyponatremia. -improved. Follow  Lab 10/11/11 0405 10/10/11 0428 10/09/11 0551  NA 136 134* 131*  K 4.4 5.1 --  CL 106 106 97  CO2 24 20 26   GLUCOSE 151* 171* 168*  BUN 17 18 14   CREATININE 0.61 0.74 0.84  CALCIUM 7.7* 6.9* 9.0  MG -- 2.2 --  PHOS -- 3.2 --    GASTROINTESTINAL: 1. Sigmoid diverticulitis/abscess perforation sp colon resection and colostomy placement by Dr. Andrey Campanile -wound and stoma care per surgery team -NPO -NS IVF -follow stoma OU - on Invanz day #1 see ID section  2. Chronic pancreatitis  Per CT of abdomen -amylase -cmet in am -NPO -consider to check TG   HEMATOLOGIC: 1. Anemia sp colon resection.  No S:S of active bleed -follow H:H  2  Lab 10/11/11 0405 10/10/11 0428 10/09/11 2315  HGB 9.5* 9.2* 9.4*  HCT 29.8* 30.0* 30.0*  WBC 26.7* 21.9* 21.5*  PLT 378 325 289     INFECTIOUS: Leukocytosis/Abdominal wound sp sigmoid diverticula abscess resection on 10/09/11 -Invanz day#1 -Bcx drawn but may not be helpful as were drawn after intuition of therapy with Invanz -wound care per surgery team   Lab 10/11/11 0405 10/10/11 0428 10/09/11 2315 10/09/11 0551  WBC 26.7* 21.9* 21.5* 27.4*   No results found for this basename: PROCALCITON:5 in the last 168 hours   ENDOCRINE: DM,  type 2 -NPO sp abdominal surgery - ICU Hyperglycemia protocol -CBG's  Lab 10/11/11 0749 10/11/11 0358 10/10/11 2355 10/10/11 1950 10/10/11 1546  GLUCAP 141* 137* 131* 120* 151*    BEST PRACTICE / DISPOSITION: -->  Full code -->  NPO -->  Heparin Beatrice for DVT Px -->  Protonix IV for GI Px -->  Family updated at bedside   [facility] Minor ACNP Adolph Pollack PCCM Pager [phone] till 3 pm If no answer page [phone] 10/11/2011, 10:28 AM  Pt seen and examined and database reviewed. I agree with above findings, assessment and plan  Billy Fischer, MD;  PCCM service; Mobile [phone]

## 2011-10-11 NOTE — Progress Notes (Signed)
2 Days Post-Op  Subjective: Up in chair wants to go back to bed. Extubated. Alert with normal mentation.   BP better but still on Levophed. U/O about 89ml/hr. Levophed at 81mcg/min.  HR 80's, SBP100's. C/O stomach being swollen and about to explode.     Objective: Vital signs in last 24 hours: Temp:  [97.9 F (36.6 C)-100.6 F (38.1 C)] 97.9 F (36.6 C) (01/17 0751) Pulse Rate:  [56-97] 81  (01/17 0730) Resp:  [13-35] 17  (01/17 0730) BP: (68-126)/(23-110) 107/59 mmHg (01/17 0700) SpO2:  [83 %-96 %] 91 % (01/17 0730) FiO2 (%):  [39.7 %-40.3 %] 40.3 % (01/16 1000) Weight:  [143.6 kg (316 lb 9.3 oz)] 143.6 kg (316 lb 9.3 oz) (01/17 0500) Last BM Date: 10/02/11  Intake/Output from previous day: 01/16 0701 - 01/17 0700 In: 2524.7 [I.V.:2464.7; IV Piggyback:60] Out: 1875 [Urine:875; Emesis/NG output:1000] Intake/Output this shift:    PE:  Alert up in chair. Chest, clear anteriorly,some rhonchi, rale at bases Card: SR she is not tachycardic HR high 80's no murmur or rub. Abd: large incision, open and clean.  No drainage from colostomy, except some bloody drainage,  Not much in the way of bowel sounds.I can't find the drain right now.   Lab Results:   Basename 10/11/11 0405 10/10/11 0428  WBC 26.7* 21.9*  HGB 9.5* 9.2*  HCT 29.8* 30.0*  PLT 378 325    BMET  Basename 10/11/11 0405 10/10/11 0428  NA 136 134*  K 4.4 5.1  CL 106 106  CO2 24 20  GLUCOSE 151* 171*  BUN 17 18  CREATININE 0.61 0.74  CALCIUM 7.7* 6.9*   PT/INR No results found for this basename: LABPROT:2,INR:2 in the last 72 hours   Studies/Results: Portable Chest Xray In Am  10/10/2011  *RADIOLOGY REPORT*  Clinical Data: Sepsis  PORTABLE CHEST - 1 VIEW  Comparison: October 09, 2011  Findings: The ET tube tip remains in good position, well above the carina.  The right upper extremity PICC line tip is at the cavoatrial junction.  The NG tube tip courses off the inferior film.  There is a shallow inspiration  which accentuates lung markings.  Cardiomegaly and pulmonary vascular cephalization are present without frank edema.  Left basilar atelectasis versus infiltrate does not appear significantly changed.  IMPRESSION: Cardiomegaly and pulmonary vascular cephalization without frank edema, unchanged.  No change in appearance of left basilar infiltrate versus atelectasis.  Original Report Authenticated By: Brandon Melnick, M.D.   Dg Chest Port 1 View  10/09/2011  *RADIOLOGY REPORT*  Clinical Data: Colon resection, intubated, hypertension, diabetes  PORTABLE CHEST - 1 VIEW  Comparison: 01/23/2010, 10/09/2011  Findings: Endotracheal tube 2.5 cm above the carina.  Decreased lung volumes with cardiac enlargement, vascular congestion versus mild edema.  Left effusion not excluded.  No pneumothorax.  IMPRESSION: Endotracheal tube 2.5 cm above the carina.  Low lung volumes, cardiomegaly and mild central vascular congestion/edema.  Basilar atelectasis  Original Report Authenticated By: Judie Petit. Ruel Favors, M.D.    Anti-infectives: Anti-infectives     Start     Dose/Rate Route Frequency Ordered Stop   10/10/11 0700   ertapenem (INVANZ) 1 g in sodium chloride 0.9 % 50 mL IVPB  Status:  Discontinued        1 g 100 mL/hr over 30 Minutes Intravenous Every 24 hours 10/09/11 1336 10/09/11 2022   10/09/11 2200   ertapenem (INVANZ) 1 g in sodium chloride 0.9 % 50 mL IVPB  1 g 100 mL/hr over 30 Minutes Intravenous Every 24 hours 10/09/11 2022     10/09/11 0730   ertapenem (INVANZ) 1 g in sodium chloride 0.9 % 50 mL IVPB        1 g 100 mL/hr over 30 Minutes Intravenous  Once 10/09/11 0715 10/09/11 0842         Current Facility-Administered Medications  Medication Dose Route Frequency Provider Last Rate Last Dose  . 0.9 %  sodium chloride infusion  250 mL Intravenous PRN Deatra Robinson, MD      . 0.9 %  sodium chloride infusion   Intravenous Continuous Billy Fischer, MD 75 mL/hr at 10/11/11 0225 75 mL/hr at  10/11/11 0225  . diphenhydrAMINE (BENADRYL) injection 12.5 mg  12.5 mg Intravenous Q6H PRN Atilano Ina, MD       Or  . diphenhydrAMINE (BENADRYL) 12.5 MG/5ML elixir 12.5 mg  12.5 mg Oral Q6H PRN Atilano Ina, MD      . ertapenem [facility]) 1 g in sodium chloride 0.9 % 50 mL IVPB  1 g Intravenous Q24H Atilano Ina, MD   1 g at 10/10/11 2204  . heparin injection 5,000 Units  5,000 Units Subcutaneous Q8H Atilano Ina, MD   5,000 Units at 10/11/11 0600  . insulin aspart (novoLOG) injection 0-4 Units  0-4 Units Subcutaneous Q4H Deatra Robinson, MD   1 Units at 10/11/11 0415  . morphine 1 MG/ML PCA injection   Intravenous Q4H Atilano Ina, MD   2.6 mg at 10/11/11 0400  . naloxone [facility]) injection 0.4 mg  0.4 mg Intravenous PRN Atilano Ina, MD       And  . sodium chloride 0.9 % injection 9 mL  9 mL Intravenous PRN Atilano Ina, MD      . norepinephrine (LEVOPHED) 8,000 mcg in dextrose 5 % 250 mL infusion  2-50 mcg/min Intravenous Continuous Atilano Ina, MD 20.6 mL/hr at 10/10/11 2204 11 mcg/min at 10/10/11 2204  . ondansetron (ZOFRAN) injection 4 mg  4 mg Intravenous Q6H PRN Atilano Ina, MD      . pantoprazole (PROTONIX) injection 40 mg  40 mg Intravenous QHS Deatra Robinson, MD   40 mg at 10/10/11 2204  . phenol (CHLORASEPTIC) mouth spray 1 spray  1 spray Mouth/Throat PRN Billy Fischer, MD      . sodium chloride 0.9 % bolus 500 mL  500 mL Intravenous Once Sherrie George, PA   500 mL at 10/10/11 0915  . sodium chloride 0.9 % injection 10 mL  10 mL Intracatheter PRN Atilano Ina, MD      . DISCONTD: dexmedetomidine (PRECEDEX) 400 mcg in sodium chloride 0.9 % 100 mL infusion  0.4-1.2 mcg/kg/hr Intravenous Titrated Benny Lennert, PHARMD 23.7 mL/hr at 10/10/11 1000 0.7 mcg/kg/hr at 10/10/11 1000  . DISCONTD: fentaNYL (SUBLIMAZE) injection 12.5-25 mcg  12.5-25 mcg Intravenous Q2H PRN Billy Fischer, MD      . DISCONTD: fentaNYL (SUBLIMAZE) injection 25-50 mcg  25-50 mcg Intravenous Q2H PRN  Deatra Robinson, MD   50 mcg at 10/10/11 0815  . DISCONTD: midazolam (VERSED) injection 1-2 mg  1-2 mg Intravenous Q1H PRN Deatra Robinson, MD   2 mg at 10/10/11 0212  . DISCONTD: norepinephrine (LEVOPHED) 8,000 mcg in dextrose 5 % 250 mL infusion  2-50 mcg/min Intravenous Continuous Shelba Flake, MD 11.3 mL/hr at 10/10/11 1000 6 mcg/min at 10/10/11 1000  . DISCONTD: ondansetron (ZOFRAN) injection 4 mg  4 mg Intravenous Q6H  PRN Atilano Ina, MD        Assessment/Plan Perforated Sigmoid diverticulitis, with exploratory lap, Sigmoid colectomy, end colostomy. POD2 Post op hypotension,olguria, hypotension improving on low dose norepinephrine, Acidosis  PH7.28  (0432 this AM) Sepsis (WBC 21.9) WBCstill up 26.7 today. She had some steroids yesterdayl Hypertension by history Diabetes Arthritis Morbid Obesity BMI 55 Sleep Apnea with BiPap at home.  Plan:   She is on levophed, BP is better, U/O better, C/O stomach being swollen.  Dressing changes BID requested yesterday.  Will be sure she has BIpap orders.  It's going to take allot of work to mobilize her after she comes off pressor. Continue NPO, NG, OOB to chair watch CBC, renal function.   LOS: 2 days    Sarath Privott 10/11/2011

## 2011-10-12 ENCOUNTER — Inpatient Hospital Stay (HOSPITAL_COMMUNITY): Payer: Medicare Other

## 2011-10-12 LAB — COMPREHENSIVE METABOLIC PANEL
Albumin: 1.8 g/dL — ABNORMAL LOW (ref 3.5–5.2)
Alkaline Phosphatase: 98 U/L (ref 39–117)
BUN: 16 mg/dL (ref 6–23)
Potassium: 4.8 mEq/L (ref 3.5–5.1)
Sodium: 137 mEq/L (ref 135–145)
Total Protein: 5.3 g/dL — ABNORMAL LOW (ref 6.0–8.3)

## 2011-10-12 LAB — GLUCOSE, CAPILLARY
Glucose-Capillary: 120 mg/dL — ABNORMAL HIGH (ref 70–99)
Glucose-Capillary: 93 mg/dL (ref 70–99)

## 2011-10-12 LAB — MAGNESIUM: Magnesium: 2.5 mg/dL (ref 1.5–2.5)

## 2011-10-12 LAB — CBC
HCT: 29.5 % — ABNORMAL LOW (ref 36.0–46.0)
MCHC: 30.2 g/dL (ref 30.0–36.0)
Platelets: 388 10*3/uL (ref 150–400)
RDW: 19 % — ABNORMAL HIGH (ref 11.5–15.5)

## 2011-10-12 NOTE — Progress Notes (Signed)
3 Days Post-Op  Subjective: No major issues. Off Levo. Pain ok.   Objective: Vital signs in last 24 hours: Temp:  [97.5 F (36.4 C)-99.6 F (37.6 C)] 97.7 F (36.5 C) (01/18 0813) Pulse Rate:  [55-88] 75  (01/18 0800) Resp:  [10-24] 16  (01/18 0800) BP: (86-173)/(37-87) 124/50 mmHg (01/18 0800) SpO2:  [66 %-97 %] 94 % (01/18 0800) Weight:  [316 lb 5.8 oz (143.5 kg)] 316 lb 5.8 oz (143.5 kg) (01/18 0600) Last BM Date: 10/02/11  Intake/Output from previous day: 01/17 0701 - 01/18 0700 In: 2061 [I.V.:1951; NG/GT:60; IV Piggyback:50] Out: 1500 [Urine:1050; Emesis/NG output:450] Intake/Output this shift: Total I/O In: 75 [I.V.:75] Out: 100 [Urine:100]  Alert, nad cta ant with decreased BS at bases Regular Obese, soft, expected mild TTP, +air in bag. Ostomy viable Midline wound - no necrotic tissue. No granulation tissue yet. Fascial sutures intact. No edema/ +scds  Lab Results:   Basename 10/12/11 0411 10/11/11 0405  WBC 21.1* 26.7*  HGB 8.9* 9.5*  HCT 29.5* 29.8*  PLT 388 378   BMET  Basename 10/12/11 0411 10/11/11 0405  NA 137 136  K 4.8 4.4  CL 105 106  CO2 24 24  GLUCOSE 120* 151*  BUN 16 17  CREATININE 0.58 0.61  CALCIUM 7.6* 7.7*   PT/INR No results found for this basename: LABPROT:2,INR:2 in the last 72 hours ABG  Basename 10/10/11 0432 10/09/11 2125  PHART 7.287* 7.226*  HCO3 20.2 22.8    Studies/Results: Dg Chest Port 1 View  10/12/2011  *RADIOLOGY REPORT*  Clinical Data: Post extubation.  PORTABLE CHEST - 1 VIEW  Comparison: 10/10/2011.  Findings: Right central line tip projects at the mid superior vena cava level.  No gross pneumothorax.  Endotracheal tube removed.  Nasogastric tube courses below the diaphragm.  The tip is not included on this exam.  Cardiomegaly.  Pulmonary vascular congestion most notable centrally.  Limited evaluation lung bases.  The patient would eventually benefit from follow-up two-view chest with cardiac leads removed.   IMPRESSION: Endotracheal tube has been removed.  Cardiomegaly with pulmonary vascular prominence most notable centrally.  Limited evaluation of the lung bases.  Original Report Authenticated By: Fuller Canada, M.D.   Dg Abd Portable 1v  10/11/2011  *RADIOLOGY REPORT*  Clinical Data: 71 year old female with abdominal distention. Obesity.  Perforated diverticulitis.  PORTABLE ABDOMEN - 1 VIEW  Comparison: CT abdomen and pelvis 10/09/2011 and earlier.  Findings: AP supine portable view at 1115 hours.  Enteric tube in place in the left upper quadrant.  Gas filled bowel loops in the mid and right abdomen.  Small bowel loops are at the upper limits of normal.  No abnormally dilated gas filled colon.  Increased opacity in the region of the sigmoid colon compatible with the abnormality seen on recent CT. Pneumoperitoneum was better demonstrated on the recent CT.  IMPRESSION: Increased gas filled bowel loops which measure at the upper limits of normal.  Enteric tube remains in place. See recent CT regarding perforated sigmoid diverticulitis.  Original Report Authenticated By: Harley Hallmark, M.D.    Anti-infectives: Anti-infectives     Start     Dose/Rate Route Frequency Ordered Stop   10/10/11 0700   ertapenem (INVANZ) 1 g in sodium chloride 0.9 % 50 mL IVPB  Status:  Discontinued        1 g 100 mL/hr over 30 Minutes Intravenous Every 24 hours 10/09/11 1336 10/09/11 2022   10/09/11 2200   ertapenem (INVANZ) 1  g in sodium chloride 0.9 % 50 mL IVPB        1 g 100 mL/hr over 30 Minutes Intravenous Every 24 hours 10/09/11 2022     10/09/11 0730   ertapenem (INVANZ) 1 g in sodium chloride 0.9 % 50 mL IVPB        1 g 100 mL/hr over 30 Minutes Intravenous  Once 10/09/11 0715 10/09/11 0842          Assessment/Plan: s/p Procedure(s): COLON RESECTION  D/c NG tube D/c foley Sips of clears Cont IV abx for perf colon day 3 Cont subcu heparin Transfer to floor PT/OT Negative pressure wound therapy  to midline incision -will place white sponge at base of wound then black sponge Watch BP - may need to restart home BP med soon  Jahayra Mazo M. Andrey Campanile, MD, FACS General, Bariatric, & Minimally Invasive Surgery [facility], [place]     LOS: 3 days    Atilano Ina 10/12/2011

## 2011-10-12 NOTE — Progress Notes (Signed)
Name: [name] MRN: [id number] DOB: [birth year]    LOS: 3  PCCM FOLLOWUP NOTE  History of Present Illness: [name] is a 71 y.o. Woman admitted as a transfer from Dr. Andrey Campanile (surgery) team.  Initially she presented to [facility]  with c/o progressive abd pain and constipation for 7 days. Her pain intensified over the past few days and she developed fever and nausea as well. She has had a BM, but it was small and loose. She denied hematochezia. Work up revealed elevated WBC and some free air on plain films, this prompted surgical eval and CT. CT done shows free air and sigmoid diverticulitis with a 6cm air/fluid abscess.  She has known hx of diverticulosis on prior colonoscopy, but no hx of diverticulitis requiring hospitalization and/or abx. Pt requested transfer to [facility]. Patient underwent emergent explorative laparotomy with sigmoid bowel resection and colostomy by Dr. Andrey Campanile on 10/08/10. She tolerated procedure without complications.  It was decided to continue to keep her on vent support overnight due to OSA and morbid obesity with plan to possibly extubate in am. Hence, PCCM assistance was requested for vent management.  Lines / Drains: PICC line (triple lumen) 1/15>>> ETT 1/15>>>1/16 NGT 1/15>>> 1/18 Colostomy 1/15>>>  Cultures: MRSA PCR neg 1/15 UA neg 1/15 BCx x2 1/15 >> NEG  Antibiotics: Ertapenem 1/15>>>  Tests / Events: Dg Chest Port 1 View  10/12/2011  *RADIOLOGY REPORT*  Clinical Data: Post extubation.  PORTABLE CHEST - 1 VIEW  Comparison: 10/10/2011.  Findings: Right central line tip projects at the mid superior vena cava level.  No gross pneumothorax.  Endotracheal tube removed.  Nasogastric tube courses below the diaphragm.  The tip is not included on this exam.  Cardiomegaly.  Pulmonary vascular congestion most notable centrally.  Limited evaluation lung bases.  The patient would eventually benefit from follow-up two-view chest with cardiac leads removed.  IMPRESSION:  Endotracheal tube has been removed.  Cardiomegaly with pulmonary vascular prominence most notable centrally.  Limited evaluation of the lung bases.  Original Report Authenticated By: Fuller Canada, M.D.   Dg Abd Portable 1v  10/11/2011  *RADIOLOGY REPORT*  Clinical Data: 71 year old female with abdominal distention. Obesity.  Perforated diverticulitis.  PORTABLE ABDOMEN - 1 VIEW  Comparison: CT abdomen and pelvis 10/09/2011 and earlier.  Findings: AP supine portable view at 1115 hours.  Enteric tube in place in the left upper quadrant.  Gas filled bowel loops in the mid and right abdomen.  Small bowel loops are at the upper limits of normal.  No abnormally dilated gas filled colon.  Increased opacity in the region of the sigmoid colon compatible with the abnormality seen on recent CT. Pneumoperitoneum was better demonstrated on the recent CT.  IMPRESSION: Increased gas filled bowel loops which measure at the upper limits of normal.  Enteric tube remains in place. See recent CT regarding perforated sigmoid diverticulitis.  Original Report Authenticated By: Harley Hallmark, M.D.      SUBJ: awake and alert. No complaints  Vital Signs: Temp:  [97.7 F (36.5 C)-99.6 F (37.6 C)] 98 F (36.7 C) (01/18 1201) Pulse Rate:  [70-87] 70  (01/18 1100) Resp:  [9-19] 11  (01/18 1100) BP: (86-173)/(42-87) 118/51 mmHg (01/18 1200) SpO2:  [88 %-97 %] 94 % (01/18 1100) Weight:  [143.5 kg (316 lb 5.8 oz)] 143.5 kg (316 lb 5.8 oz) (01/18 0600) I/O last 3 completed shifts: In: 3357.2 [I.V.:3187.2; NG/GT:60; IV Piggyback:110] Out: 2035 [Urine:1585; Emesis/NG output:450]  Physical  Examination: General:  No distress Neuro: intact HEENT:  WNL Neck:  No JVD's no masses  Cardiovascular:  RRR, no MR Lungs:  Clear anteriorly Abdomen:  BS present; obese and slightly distended; soft; open wound midline with dressing and no exudate; stoma with minimal sanguinous discharge and no output otherwise.v NGT LWIS with green  bile Musculoskeletal:  No effusions or joint deformities bilaterally. Skin:  Warm, no rashes. Left arm periph line wihtout erythema, edema, increased warmth or infiltration.  Labs and Imaging:    Lab 10/12/11 0411 10/11/11 0405 10/10/11 0428  NA 137 136 134*  K 4.8 4.4 5.1  CL 105 106 106  CO2 24 24 20   BUN 16 17 18   CREATININE 0.58 0.61 0.74  GLUCOSE 120* 151* 171*    Lab 10/12/11 0411 10/11/11 0405 10/10/11 0428  HGB 8.9* 9.5* 9.2*  HCT 29.5* 29.8* 30.0*  WBC 21.1* 26.7* 21.9*  PLT 388 378 325    Assessment and Plan:  PULMONARY: Hx of OSA  -Extubated now - tolerating well. Usual post op resp care implemented. nCPAP with sleep  CARDIOVASCULAR: Off pressors  RENAL: Hyponatremia, mild. Euvolemic. -resolved  Lab 10/12/11 0411 10/11/11 0405 10/10/11 0428 10/09/11 1705 10/09/11 0551  NA 137 136 134* 133* 131*  K 4.8 4.4 -- -- --  CL 105 106 106 -- 97  CO2 24 24 20  -- 26  GLUCOSE 120* 151* 171* 157* 168*  BUN 16 17 18  -- 14  CREATININE 0.58 0.61 0.74 -- 0.84  CALCIUM 7.6* 7.7* 6.9* -- 9.0  MG 2.5 -- 2.2 -- --  PHOS -- -- 3.2 -- --    GASTROINTESTINAL: 1. Sigmoid diverticulitis/abscess perforation sp colon resection and colostomy placement by Dr. Andrey Campanile -wound and stoma care per surgery team - decision re: abx duration per CCS   HEMATOLOGIC: 1. Anemia sp colon resection.  No indication for PRBCs  Lab 10/12/11 0411 10/11/11 0405 10/10/11 0428  HGB 8.9* 9.5* 9.2*  HCT 29.5* 29.8* 30.0*  WBC 21.1* 26.7* 21.9*  PLT 388 378 325     ENDOCRINE: DM, type 2 -Cont SSI -Change to ACHS when diet initiated  PCCM will sign off. Please call if we can be of further assistance  Billy Fischer, MD;  PCCM service; Mobile [phone]

## 2011-10-12 NOTE — Progress Notes (Signed)
Pt placed on Home CPAP on home settings with 6L bleed in.  Will titrate 02 as tolerated.  VS stable.

## 2011-10-12 NOTE — Consult Note (Signed)
WOC follow up for NPWT dressing placement.  Verified with Dr. Andrey Campanile orders for NPWT and dressing placemen.  Pt on PCA for pain management for dressing change.  Utilized horseshoe of hydrocolloid at bottom aspect of wound edges due to large amounts of serous drainage pooling in this area to aid in obtain seal. 2pc of white foam placed in base of wound, 1 large pc black foam used in remainder of wound to fill wound.  Suction applied at 125 mmHG, seal obtained after manipulation of dressing at distal aspect where pooling was occuring.  Pt tolerated without problems.   WOC will follow for NPWT dressing changes and for ostomy care and teaching  [name] [place], [place] [phone]

## 2011-10-12 NOTE — Progress Notes (Signed)
ANTIBIOTIC CONSULT NOTE - Follow-up  Pharmacy Consult for Invanz - Day #3 Indication: Perforated diverticulitis  Assessment: 71 yo female admitted with c/o abdominal pain at [facility] with fever and nausea.  She was transferred to [facility] for exploratory laparotomy and subsequently had a sigmoid bowel resection and colostomy.  She was started on IV Invanz and has tolerated this without noted complications.  Plan:  Will continue current regimen of Invanz 1g IV q24h. Monitor for ongoing s/s infection and any acute changes in renal function.  Nadara Mustard, PharmD., MS Clinical Pharmacist Pager:  [phone] 10/12/2011,9:45 AM   Allergies  Allergen Reactions  . Codeine Itching    Patient Measurements: Height: 5\' 5"  (165.1 cm) Weight: 316 lb 5.8 oz (143.5 kg) IBW/kg (Calculated) : 57     Vital Signs: Temp: 97.7 F (36.5 C) (01/18 0813) Temp src: Oral (01/18 0813) BP: 134/51 mmHg (01/18 0900) Pulse Rate: 73  (01/18 0900) Intake/Output from previous day: 01/17 0701 - 01/18 0700 In: 2136 [I.V.:2026; NG/GT:60; IV Piggyback:50] Out: 1500 [Urine:1050; Emesis/NG output:450] Intake/Output from this shift: Total I/O In: 210 [P.O.:60; I.V.:150] Out: 130 [Urine:130]  Labs:  [facility] 10/12/11 0411 10/11/11 0405 10/10/11 0428  WBC 21.1* 26.7* 21.9*  HGB 8.9* 9.5* 9.2*  PLT 388 378 325  LABCREA -- -- --  CREATININE 0.58 0.61 0.74   Estimated Creatinine Clearance: 91.9 ml/min (by C-G formula based on Cr of 0.58).  Microbiology: Recent Results (from the past 720 hour(s))  URINE CULTURE     Status: Normal   Collection Time   10/09/11  4:48 AM      Component Value Range Status Comment   Specimen Description URINE, CATHETERIZED   Final    Special Requests NONE   Final    Setup Time [id number]   Final    Colony Count NO GROWTH   Final    Culture NO GROWTH   Final    Report Status 10/10/2011 FINAL   Final   MRSA PCR SCREENING     Status: Normal   Collection Time   10/09/11  1:39  PM      Component Value Range Status Comment   MRSA by PCR NEGATIVE  NEGATIVE  Final   CULTURE, BLOOD (ROUTINE X 2)     Status: Normal (Preliminary result)   Collection Time   10/09/11 10:12 PM      Component Value Range Status Comment   Specimen Description BLOOD LEFT HAND   Final    Special Requests BOTTLES DRAWN AEROBIC AND ANAEROBIC 10CC   Final    Setup Time [id number]   Final    Culture     Final    Value:        BLOOD CULTURE RECEIVED NO GROWTH TO DATE CULTURE WILL BE HELD FOR 5 DAYS BEFORE ISSUING A FINAL NEGATIVE REPORT   Report Status PENDING   Incomplete   CULTURE, BLOOD (ROUTINE X 2)     Status: Normal (Preliminary result)   Collection Time   10/09/11 11:30 PM      Component Value Range Status Comment   Specimen Description BLOOD PICC LINE   Final    Special Requests BOTTLES DRAWN AEROBIC AND ANAEROBIC 10CC   Final    Setup Time [id number]   Final    Culture     Final    Value:        BLOOD CULTURE RECEIVED NO GROWTH TO DATE CULTURE WILL BE HELD FOR 5 DAYS BEFORE ISSUING A FINAL  NEGATIVE REPORT   Report Status PENDING   Incomplete     Medical History: Past Medical History  Diagnosis Date  . Hypertension   . Diabetes mellitus   . Arthritis     Medications:  Scheduled:     . ertapenem (INVANZ) IV  1 g Intravenous Q24H  . heparin  5,000 Units Subcutaneous Q8H  . insulin aspart  0-4 Units Subcutaneous Q4H  . morphine   Intravenous Q4H  . pantoprazole (PROTONIX) IV  40 mg Intravenous QHS

## 2011-10-13 ENCOUNTER — Inpatient Hospital Stay (HOSPITAL_COMMUNITY): Payer: Medicare Other

## 2011-10-13 LAB — BLOOD GAS, ARTERIAL
Delivery systems: POSITIVE
Drawn by: 252031
O2 Content: 10 L/min
Patient temperature: 98.6
pH, Arterial: 7.332 — ABNORMAL LOW (ref 7.350–7.400)

## 2011-10-13 LAB — BASIC METABOLIC PANEL
CO2: 26 mEq/L (ref 19–32)
Calcium: 7.8 mg/dL — ABNORMAL LOW (ref 8.4–10.5)
Creatinine, Ser: 0.54 mg/dL (ref 0.50–1.10)

## 2011-10-13 LAB — CBC
MCH: 23.7 pg — ABNORMAL LOW (ref 26.0–34.0)
MCHC: 29.5 g/dL — ABNORMAL LOW (ref 30.0–36.0)
MCV: 80.5 fL (ref 78.0–100.0)
Platelets: 412 10*3/uL — ABNORMAL HIGH (ref 150–400)
RDW: 18.8 % — ABNORMAL HIGH (ref 11.5–15.5)
WBC: 16.7 10*3/uL — ABNORMAL HIGH (ref 4.0–10.5)

## 2011-10-13 LAB — GLUCOSE, CAPILLARY
Glucose-Capillary: 88 mg/dL (ref 70–99)
Glucose-Capillary: 91 mg/dL (ref 70–99)
Glucose-Capillary: 109 mg/dL — ABNORMAL HIGH (ref 70–99)

## 2011-10-13 MED ORDER — SODIUM CHLORIDE 0.9 % IJ SOLN
10.0000 mL | INTRAMUSCULAR | Status: DC | PRN
  Administered 2011-10-14 (×2): 10 mL via INTRAVENOUS

## 2011-10-13 MED ORDER — SODIUM CHLORIDE 0.9 % IJ SOLN
10.0000 mL | Freq: Two times a day (BID) | INTRAMUSCULAR | Status: DC
  Administered 2011-10-13 – 2011-10-22 (×3): 10 mL via INTRAVENOUS

## 2011-10-13 MED ORDER — FUROSEMIDE 10 MG/ML IJ SOLN
40.0000 mg | Freq: Once | INTRAMUSCULAR | Status: AC
  Administered 2011-10-13: 40 mg via INTRAVENOUS
  Filled 2011-10-13: qty 4

## 2011-10-13 NOTE — Progress Notes (Signed)
Pt arrived to dept 5100 via wheelchair from 2311.  Pt transported by [place] and 2300 NT.  Pt transported to bed from wheelchair with minimal difficulty.  Pt rating abd pain a 7 after the move and she continued using her PCA.  Her husband is at the bedside.  VSS  BP=131/55, HR=77, T=98.5, R=18, and sats are 93% on 6 L/min nasal cannula.  Will continue to monitor. Sol Blazing [facility] 10/13/2011

## 2011-10-13 NOTE — Progress Notes (Signed)
PT Cancellation Note  Treatment cancelled today due to patient's refusal to participate - secondary to sitting in chair for 5 hours this morning  Edwyna Perfect, PT  Pager [phone]  10/13/2011, 12:38 PM

## 2011-10-13 NOTE — Progress Notes (Signed)
CCS/Aidynn Krenn Progress Note 4 Days Post-Op  Subjective: The patient is sitting up in chair, still having pain on low dose PCA of morphine.  Has orders for 3300 unit, beds not available  Objective: Vital signs in last 24 hours: Temp:  [97.7 F (36.5 C)-98.7 F (37.1 C)] 97.7 F (36.5 C) (01/19 0743) Pulse Rate:  [69-86] 75  (01/19 0600) Resp:  [9-24] 14  (01/19 0600) BP: (104-134)/(43-77) 134/61 mmHg (01/19 0600) SpO2:  [81 %-98 %] 91 % (01/19 0600) Weight:  [143.6 kg (316 lb 9.3 oz)] 143.6 kg (316 lb 9.3 oz) (01/19 0600) Last BM Date: 10/02/11  Intake/Output from previous day: 01/18 0701 - 01/19 0700 In: 1380 [P.O.:480; I.V.:900] Out: 1575 [Urine:1425; Drains:150] Intake/Output this shift:    General: No acute distress  Lungs: Clear to auscultation.  Oxygen saturation of 90-95%.  No CXR today, but none needed.  Abd: Distended, hypoactive but present bowel sounds, gas in ostomy bag.  Minimal stool output.  Extremities: No DVT signs or symptoms.  Neuro: Intact  Lab Results BMET  Basename 10/13/11 0415 10/12/11 0411  NA 139 137  K 4.2 4.8  CL 104 105  CO2 26 24  GLUCOSE 95 120*  BUN 16 16  CREATININE 0.54 0.58  CALCIUM 7.8* 7.6*    Studies/Results: Dg Chest Port 1 View  10/12/2011  *RADIOLOGY REPORT*  Clinical Data: Post extubation.  PORTABLE CHEST - 1 VIEW  Comparison: 10/10/2011.  Findings: Right central line tip projects at the mid superior vena cava level.  No gross pneumothorax.  Endotracheal tube removed.  Nasogastric tube courses below the diaphragm.  The tip is not included on this exam.  Cardiomegaly.  Pulmonary vascular congestion most notable centrally.  Limited evaluation lung bases.  The patient would eventually benefit from follow-up two-view chest with cardiac leads removed.  IMPRESSION: Endotracheal tube has been removed.  Cardiomegaly with pulmonary vascular prominence most notable centrally.  Limited evaluation of the lung bases.  Original Report  Authenticated By: Fuller Canada, M.D.   Dg Abd Portable 1v  10/11/2011  *RADIOLOGY REPORT*  Clinical Data: 71 year old female with abdominal distention. Obesity.  Perforated diverticulitis.  PORTABLE ABDOMEN - 1 VIEW  Comparison: CT abdomen and pelvis 10/09/2011 and earlier.  Findings: AP supine portable view at 1115 hours.  Enteric tube in place in the left upper quadrant.  Gas filled bowel loops in the mid and right abdomen.  Small bowel loops are at the upper limits of normal.  No abnormally dilated gas filled colon.  Increased opacity in the region of the sigmoid colon compatible with the abnormality seen on recent CT. Pneumoperitoneum was better demonstrated on the recent CT.  IMPRESSION: Increased gas filled bowel loops which measure at the upper limits of normal.  Enteric tube remains in place. See recent CT regarding perforated sigmoid diverticulitis.  Original Report Authenticated By: Harley Hallmark, M.D.    Anti-infectives: Anti-infectives     Start     Dose/Rate Route Frequency Ordered Stop   10/10/11 0700   ertapenem (INVANZ) 1 g in sodium chloride 0.9 % 50 mL IVPB  Status:  Discontinued        1 g 100 mL/hr over 30 Minutes Intravenous Every 24 hours 10/09/11 1336 10/09/11 2022   10/09/11 2200   ertapenem (INVANZ) 1 g in sodium chloride 0.9 % 50 mL IVPB        1 g 100 mL/hr over 30 Minutes Intravenous Every 24 hours 10/09/11 2022  10/09/11 0730   ertapenem (INVANZ) 1 g in sodium chloride 0.9 % 50 mL IVPB        1 g 100 mL/hr over 30 Minutes Intravenous  Once 10/09/11 0715 10/09/11 0842          Assessment/Plan: s/p Procedure(s): COLON RESECTION d/c foley Advance diet Transfer to 3300.  LOS: 4 days   Marta Lamas. Gae Bon, MD, FACS [phone] [facility] 10/13/2011

## 2011-10-14 LAB — GLUCOSE, CAPILLARY
Glucose-Capillary: 113 mg/dL — ABNORMAL HIGH (ref 70–99)
Glucose-Capillary: 109 mg/dL — ABNORMAL HIGH (ref 70–99)
Glucose-Capillary: 154 mg/dL — ABNORMAL HIGH (ref 70–99)

## 2011-10-14 MED ORDER — INSULIN ASPART 100 UNIT/ML ~~LOC~~ SOLN
0.0000 [IU] | Freq: Three times a day (TID) | SUBCUTANEOUS | Status: DC
  Administered 2011-10-14: 1 [IU] via SUBCUTANEOUS
  Administered 2011-10-15 (×2): 3 [IU] via SUBCUTANEOUS
  Administered 2011-10-19 – 2011-10-20 (×2): 1 [IU] via SUBCUTANEOUS
  Administered 2011-10-20: 3 [IU] via SUBCUTANEOUS
  Administered 2011-10-21 – 2011-10-24 (×4): 1 [IU] via SUBCUTANEOUS

## 2011-10-14 NOTE — Progress Notes (Signed)
Patent on home CPAP at 12cm with oxygen at 10lpm. Earlier in shift patient c/o shortness of breath with Sp02=90%. Rapid response was notified as well as the doctor. ABG ordered and obtained. Will continue to monitor patient Sp02 at this time is 93%

## 2011-10-14 NOTE — Progress Notes (Signed)
abd obese, soft, ostomy intact/viable.   Pt/ot Cont IV abx for a total of 7 days for perf sigmoid diverticulits Ostomy teaching Transition to oral pain meds on Monday Cont NPWT.  Mary Sella. Andrey Campanile, MD, FACS General, Bariatric, & Minimally Invasive Surgery [facility], [place]

## 2011-10-14 NOTE — Progress Notes (Signed)
Physical Therapy Evaluation Patient Details Name: CAROLY PUREWAL MRN: [id number] DOB: [birth year] Today's Date: 10/14/2011  Problem List:  Patient Active Problem List  Diagnoses  . OBSTRUCTIVE SLEEP APNEA  . Diverticulitis of colon with perforation  . Pneumoperitoneum - abdomen  . Hypertension  . Diabetes mellitus    Past Medical History:  Past Medical History  Diagnosis Date  . Hypertension   . Diabetes mellitus   . Arthritis    Past Surgical History:  Past Surgical History  Procedure Date  . Abdominal hysterectomy   . Appendectomy   . Back surgery   . Colon resection 10/09/2011    Procedure: COLON RESECTION;  Surgeon: Atilano Ina, MD;  Location: [facility] OR;  Service: General;  Laterality: N/A;  Colon Resection with colostomy    PT Assessment/Plan/Recommendation PT Assessment Clinical Impression Statement: pt presents with colon perforation s/p resection with osotomy and wound vac.   PT Recommendation/Assessment: Patient will need skilled PT in the acute care venue PT Problem List: Decreased strength;Decreased activity tolerance;Decreased balance;Decreased mobility;Decreased knowledge of use of DME;Cardiopulmonary status limiting activity;Obesity;Pain Barriers to Discharge: None PT Therapy Diagnosis : Difficulty walking;Generalized weakness;Acute pain PT Plan PT Frequency: Min 3X/week PT Treatment/Interventions: DME instruction;Gait training;Stair training;Functional mobility training;Therapeutic activities;Therapeutic exercise;Balance training;Patient/family education PT Recommendation Recommendations for Other Services: OT consult Follow Up Recommendations: Home health PT;Supervision/Assistance - 24 hour;Skilled nursing facility (would benefit from SNF, but pt refusing.  ) Equipment Recommended: 3 in 1 bedside comode;Wheelchair (measurements) PT Goals  Acute Rehab PT Goals PT Goal Formulation: With patient Time For Goal Achievement: 2 weeks Pt will go Supine/Side to Sit:  with supervision PT Goal: Supine/Side to Sit - Progress: Goal set today Pt will go Sit to Supine/Side: with supervision PT Goal: Sit to Supine/Side - Progress: Goal set today Pt will go Sit to Stand: with supervision PT Goal: Sit to Stand - Progress: Goal set today Pt will Ambulate: 51 - 150 feet;with min assist;with rolling walker PT Goal: Ambulate - Progress: Goal set today Pt will Go Up / Down Stairs: 1-2 stairs;with min assist;with least restrictive assistive device PT Goal: Up/Down Stairs - Progress: Goal set today  PT Evaluation Precautions/Restrictions  Precautions Precautions: Fall Restrictions Weight Bearing Restrictions: No Other Position/Activity Restrictions: pt with Abdominal Wound Vac and Ostomy Prior Functioning  Home Living Lives With: Spouse Receives Help From: Family Type of Home: House Home Layout: Two level;Able to live on main level with bedroom/bathroom Home Access: Stairs to enter Entrance Stairs-Rails: Right Entrance Stairs-Number of Steps: 2 Bathroom Shower/Tub: Engineer, manufacturing systems: Standard Bathroom Accessibility: Yes How Accessible: Accessible via walker Home Adaptive Equipment: Straight cane;Tub transfer bench;Walker - rolling Prior Function Level of Independence: Independent with transfers;Independent with gait;Needs assistance with ADLs;Needs assistance with homemaking Able to Take Stairs?: Yes Driving: Yes Cognition Cognition Orientation Level: Oriented X4 Sensation/Coordination   Extremity Assessment RLE Assessment RLE Assessment:  (Grossly 4/5. ROM limited by habitus) LLE Assessment LLE Assessment:  (Grossly 4/5.  ROM limited by habitus.  ) Mobility (including Balance) Bed Mobility Bed Mobility: Yes Sit to Supine: 3: Mod assist;With rail Sit to Supine - Details (indicate cue type and reason): cues for sequence and encouragement Transfers Transfers: Yes Sit to Stand: 3: Mod assist;With upper extremity assist;With  armrests;From chair/3-in-1 Sit to Stand Details (indicate cue type and reason): cues for UE use, anterior wt shift, and encouragement Stand to Sit: 4: Min assist;With upper extremity assist;To bed Stand to Sit Details: cues for use of UEs and to control  descent.   Ambulation/Gait Ambulation/Gait: Yes Ambulation/Gait Assistance: 4: Min assist Ambulation/Gait Assistance Details (indicate cue type and reason): cues for use of RW, sequencing, pursed lip breathing, upright posture.   Ambulation Distance (Feet): 3 Feet Assistive device: Rolling walker Gait Pattern: Trunk flexed;Shuffle Stairs: No Corporate treasurer: No  Posture/Postural Control Posture/Postural Control: No significant limitations Balance Balance Assessed: No Exercise    End of Session PT - End of Session Equipment Utilized During Treatment: Gait belt Activity Tolerance: Patient limited by fatigue;Patient limited by pain Patient left: in bed;with call bell in reach;with family/visitor present Nurse Communication: Mobility status for transfers;Mobility status for ambulation General Behavior During Session: Restless Cognition: [facility] for tasks performed  Sunny Schlein, [place] [phone] 10/14/2011, 12:18 PM

## 2011-10-14 NOTE — Progress Notes (Addendum)
Pt. c/o increasing SOB. O2 sat is 90% on 10L via CPAP , at rest.  Respiratory therapist called to assess. Pt. Made comfortable in bed, no other complaints. Will continue to monitor.  22:20  Rapid response also called to assess pt.  status. Dr. Briant Sites. Orders given and carried out.  Portable chest xray ordered, ABG ordered- respiratory aware, will insert foley catheter, Lasix 40 mg IV ordered.   23: 50  Foley catheter draining well approx 1000L. Pt. Resting well. Sats 94% on CPAP.

## 2011-10-14 NOTE — Progress Notes (Signed)
5 Days Post-Op  Subjective: Pt feeling ok. Having a lot of flatus from ostomy. Pain control ok. Foley remains. No SOB this am.  Objective: Vital signs in last 24 hours: Temp:  [97.9 F (36.6 C)-98.5 F (36.9 C)] 97.9 F (36.6 C) (01/20 0546) Pulse Rate:  [67-77] 67  (01/20 0546) Resp:  [18-26] 20  (01/20 0800) BP: (120-145)/(54-58) 145/55 mmHg (01/20 0546) SpO2:  [89 %-96 %] 95 % (01/20 0800) Last BM Date: 10/02/11  Intake/Output this shift:    Physical Exam: BP 145/55  Pulse 67  Temp(Src) 97.9 F (36.6 C) (Oral)  Resp 20  Ht 5\' 5"  (1.651 m)  Wt 143.6 kg (316 lb 9.3 oz)  BMI 52.68 kg/m2  SpO2 95% Lungs: CTA sl decrease (L)basilar BS Abdomen: soft, ND.  Wound Vac intact, no peri-erythema  Ostomy looks ok with bag full of gas.  Labs: CBC  Basename 10/13/11 0415 10/12/11 0411  WBC 16.7* 21.1*  HGB 9.0* 8.9*  HCT 30.5* 29.5*  PLT 412* 388   BMET  Basename 10/13/11 0415 10/12/11 0411  NA 139 137  K 4.2 4.8  CL 104 105  CO2 26 24  GLUCOSE 95 120*  BUN 16 16  CREATININE 0.54 0.58  CALCIUM 7.8* 7.6*   LFT  Basename 10/12/11 0411  PROT 5.3*  ALBUMIN 1.8*  AST 32  ALT 30  ALKPHOS 98  BILITOT 0.2*  BILIDIR --  IBILI --  LIPASE --   PT/INR No results found for this basename: LABPROT:2,INR:2 in the last 72 hours ABG  Basename 10/13/11 2218  PHART 7.332*  HCO3 29.2*    Studies/Results: Dg Chest Port 1 View  10/13/2011  *RADIOLOGY REPORT*  Clinical Data: Shortness of breath.  PORTABLE CHEST - 1 VIEW  Comparison: 10/12/2011  Findings: Cardiomegaly with vascular congestion.  Left lower lobe atelectasis or infiltrate present.  Possible small left effusion. No acute bony abnormality.  IMPRESSION: Cardiomegaly with vascular congestion.  Left lower lobe atelectasis or consolidation with possible small left effusion.  Original Report Authenticated By: Cyndie Chime, M.D.    Assessment: Principal Problem:  *Diverticulitis of colon with  perforation Active Problems:  Pneumoperitoneum - abdomen  Hypertension  Diabetes mellitus   Procedure(s): COLON RESECTION  Plan: WBC down Increase diet to fulls. Will keep Foley. Work on Land.  LOS: 5 days    Marianna Fuss 10/14/2011

## 2011-10-14 NOTE — Progress Notes (Signed)
Called by Respiratory Therapist to come to bedside to evaluate patient due to increased oxygen needs over the course of the evening. Upon assessment, patient lying in bed in no obvious distress. Oxygen via cpap with 10 liters/Sats 89-91%; respirations 16-20. Breath sounds diminished bilaterally, poor inspiratory effort. Patient denied shortness of breath while lying still in bed however complained of dyspnea with exertion after moving around in bed to get on bedpan with nursing assistance. Dr Lindie Spruce notified per bedside nurse, new orders obtained for the following: ABG, PCXR, foley cath placement & Lasix 40mg  IV.

## 2011-10-15 LAB — CBC
HCT: 30.4 % — ABNORMAL LOW (ref 36.0–46.0)
Hemoglobin: 9 g/dL — ABNORMAL LOW (ref 12.0–15.0)
MCHC: 29.6 g/dL — ABNORMAL LOW (ref 30.0–36.0)

## 2011-10-15 LAB — GLUCOSE, CAPILLARY
Glucose-Capillary: 100 mg/dL — ABNORMAL HIGH (ref 70–99)
Glucose-Capillary: 118 mg/dL — ABNORMAL HIGH (ref 70–99)
Glucose-Capillary: 164 mg/dL — ABNORMAL HIGH (ref 70–99)
Glucose-Capillary: 168 mg/dL — ABNORMAL HIGH (ref 70–99)

## 2011-10-15 LAB — BASIC METABOLIC PANEL
BUN: 12 mg/dL (ref 6–23)
GFR calc non Af Amer: >90 mL/min (ref >90)
Glucose, Bld: 124 mg/dL — ABNORMAL HIGH (ref 70–99)
Potassium: 3.4 mEq/L — ABNORMAL LOW (ref 3.5–5.1)

## 2011-10-15 MED ORDER — SODIUM CHLORIDE 0.9 % IV SOLN
INTRAVENOUS | Status: DC
  Administered 2011-10-15 – 2011-10-20 (×2): via INTRAVENOUS
  Filled 2011-10-15 (×7): qty 1000

## 2011-10-15 MED ORDER — CHLORHEXIDINE GLUCONATE 0.12 % MT SOLN
OROMUCOSAL | Status: AC
  Administered 2011-10-15: 15 mL
  Filled 2011-10-15: qty 15

## 2011-10-15 NOTE — Progress Notes (Signed)
6 Days Post-Op  Subjective: Pt resting in bed, no new complaints. Tolerating fulls diet but hasn't had much yet. Was sitting up in chair some yesterday. Foley remains.  Objective: Vital signs in last 24 hours: Temp:  [97.4 F (36.3 C)-98.4 F (36.9 C)] 97.9 F (36.6 C) (01/21 0636) Pulse Rate:  [67-73] 73  (01/21 0636) Resp:  [17-22] 20  (01/21 0820) BP: (121-132)/(48-65) 132/61 mmHg (01/21 0636) SpO2:  [90 %-96 %] 92 % (01/21 0820) FiO2 (%):  [40 %] 40 % (01/21 0820) Last BM Date: 10/02/11  Intake/Output this shift:    Physical Exam: BP 132/61  Pulse 73  Temp(Src) 97.9 F (36.6 C) (Oral)  Resp 20  Ht 5\' 5"  (1.651 m)  Wt 143.6 kg (316 lb 9.3 oz)  BMI 52.68 kg/m2  SpO2 92% Abdomen: soft, ND. Wound vac intact, for change today. Ostomy viable, good gas and liquid stool output.  Labs: CBC  Basename 10/15/11 0500 10/13/11 0415  WBC 15.6* 16.7*  HGB 9.0* 9.0*  HCT 30.4* 30.5*  PLT 449* 412*   BMET  Basename 10/15/11 0500 10/13/11 0415  NA 142 139  K 3.4* 4.2  CL 102 104  CO2 35* 26  GLUCOSE 124* 95  BUN 12 16  CREATININE 0.52 0.54  CALCIUM 8.0* 7.8*   LFT No results found for this basename: PROT,ALBUMIN,AST,ALT,ALKPHOS,BILITOT,BILIDIR,IBILI,LIPASE in the last 72 hours PT/INR No results found for this basename: LABPROT:2,INR:2 in the last 72 hours ABG  Basename 10/13/11 2218  PHART 7.332*  HCO3 29.2*    Studies/Results: Dg Chest Port 1 View  10/13/2011  *RADIOLOGY REPORT*  Clinical Data: Shortness of breath.  PORTABLE CHEST - 1 VIEW  Comparison: 10/12/2011  Findings: Cardiomegaly with vascular congestion.  Left lower lobe atelectasis or infiltrate present.  Possible small left effusion. No acute bony abnormality.  IMPRESSION: Cardiomegaly with vascular congestion.  Left lower lobe atelectasis or consolidation with possible small left effusion.  Original Report Authenticated By: Cyndie Chime, M.D.    Assessment: Principal Problem:  *Diverticulitis  of colon with perforation Active Problems:  Pneumoperitoneum - abdomen  Hypertension  Diabetes mellitus   Procedure(s): COLON RESECTION  Plan: WBC trending down Making clinical progress. Will keep on fulls until better intake. Needs more activity/walking in halls  LOS: 6 days    [name] 10/15/2011

## 2011-10-15 NOTE — Consult Note (Signed)
WOC ostomy consult  Stoma type/location:  LLQ, end colostomy Stomal assessment/size: 1 5/8" round, flush with skin, some dark tissue at mucocutaneous junction, may slough off Peristomal assessment: intact without problems Treatment options for stomal/peristomal skin: with stoma flush with skin will add 2" barrier ring today for some gentle convexity Output: gas in pouch, no fecal output yet Ostomy pouching: 1pc.flat with 2" barrier ring placed today Education provided: pt with CPAP in place and on PCA for pain control, will plan further teaching on ostomy care at next pouch change.    WOC for NPWT dressing change today as well, wound beginning to granulate, 2pc white foam placed in base of wound and 1pc black foam used to fill wound bed completely.  Dressing seal obtained at and canister changed today.  350 cc serous drainage in canister since placement on Friday.  Pt tolerated dressing change without problems.  PA's at bedside to see wound and ostomy.  OK for bedside nurses to begin NPWT dressing changes moving forward. WOC team will follow for ostomy care and assistance with NPWT if needed.  Thanks  Shiloh Swopes [employer], CWOCN [phone])

## 2011-10-15 NOTE — Progress Notes (Signed)
Patient seen and examined.  Will get PT to see her.

## 2011-10-15 NOTE — Evaluation (Signed)
Occupational Therapy Evaluation Patient Details Name: [name] MRN: [id number] DOB: Oct 30, 1940 Today's Date: 10/15/2011  Problem List:  Patient Active Problem List  Diagnoses  . OBSTRUCTIVE SLEEP APNEA  . Diverticulitis of colon with perforation  . Pneumoperitoneum - abdomen  . Hypertension  . Diabetes mellitus    Past Medical History:  Past Medical History  Diagnosis Date  . Hypertension   . Diabetes mellitus   . Arthritis    Past Surgical History:  Past Surgical History  Procedure Date  . Abdominal hysterectomy   . Appendectomy   . Back surgery   . Colon resection 10/09/2011    Procedure: COLON RESECTION;  Surgeon: Atilano Ina, MD;  Location: [facility] OR;  Service: General;  Laterality: N/A;  Colon Resection with colostomy    OT Assessment/Plan/Recommendation OT Assessment Clinical Impression Statement: This 71 y.o. 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  Pt. also requiring use of CPAP due to decreased respiratory status.  Pt. demonstrates the below listed deficits, and will benefit from OT to maximize safety and independence with BADLs to allow her to return home with husband and min - mod A with BADLs OT Recommendation/Assessment: Patient will need skilled OT in the acute care venue OT Problem List: Decreased strength;Decreased activity tolerance;Impaired balance (sitting and/or standing);Decreased knowledge of use of DME or AE;Cardiopulmonary status limiting activity;Obesity;Pain Barriers to Discharge: None OT Therapy Diagnosis : Generalized weakness;Acute pain OT Plan OT Frequency: Min 2X/week OT Treatment/Interventions: Self-care/ADL training;DME and/or AE instruction;Therapeutic activities;Patient/family education;Balance training OT Recommendation Follow Up Recommendations: Home health OT Equipment Recommended: 3 in 1 bedside comode;Wheelchair (measurements) Individuals Consulted Consulted and Agree with Results and Recommendations:  Patient;Family member/caregiver Family Member Consulted: husband OT Goals Acute Rehab OT Goals OT Goal Formulation: With patient/family Time For Goal Achievement: 2 weeks ADL Goals Pt Will Perform Grooming: with supervision;Standing at sink ADL Goal: Grooming - Progress: Goal set today Pt Will Perform Upper Body Bathing: with set-up;Sitting, chair ADL Goal: Upper Body Bathing - Progress: Goal set today Pt Will Perform Lower Body Bathing: with min assist;with adaptive equipment;Sit to stand from bed;Sit to stand from chair ADL Goal: Lower Body Bathing - Progress: Goal set today Pt Will Perform Upper Body Dressing: with set-up;Sitting, bed;Unsupported ADL Goal: Upper Body Dressing - Progress: Goal set today Pt Will Perform Lower Body Dressing: with mod assist;Sit to stand from chair;Sit to stand from bed;with adaptive equipment ADL Goal: Lower Body Dressing - Progress: Goal set today Pt Will Transfer to Toilet: with supervision;Ambulation;Raised toilet seat without arms ADL Goal: Toilet Transfer - Progress: Goal set today Pt Will Perform Toileting - Clothing Manipulation: with min assist ADL Goal: Toileting - Clothing Manipulation - Progress: Goal set today Pt Will Perform Toileting - Hygiene: with modified independence;Sitting on 3-in-1 or toilet ADL Goal: Toileting - Hygiene - Progress: Goal set today Pt Will Perform Tub/Shower Transfer: Tub transfer;Ambulation;Transfer tub bench;with min assist ADL Goal: Tub/Shower Transfer - Progress: Goal set today Additional ADL Goal #1: Pt. will participate in 25 mins therapeutic activity to increase endurance as needed for BADLs ADL Goal: Additional Goal #1 - Progress: Goal set today  OT Evaluation Precautions/Restrictions  Precautions Precautions: Fall Restrictions Weight Bearing Restrictions: No Other Position/Activity Restrictions: pt with Abdominal Wound Vac and Ostomy Prior Functioning Home Living Lives With: Spouse Receives Help  From: Family Type of Home: House Home Layout: Two level;Able to live on main level with bedroom/bathroom Home Access: Stairs to enter Entrance Stairs-Rails: Right Entrance Stairs-Number of  Steps: 2 Bathroom Shower/Tub: Engineer, manufacturing systems: Standard Bathroom Accessibility: Yes How Accessible: Accessible via walker Home Adaptive Equipment: Straight cane;Tub transfer bench;Walker - rolling Additional Comments: Husband very supportive and able to assist as needed Prior Function Dressing: Minimal (husband assisted with socks) Able to Take Stairs?: Yes Driving: Yes Vocation: Retired ADL ADL Eating/Feeding: Simulated;Independent Where Assessed - Eating/Feeding: Bed level Grooming: Simulated;Wash/dry hands;Wash/dry face;Brushing hair;Set up Where Assessed - Grooming: Sitting, chair Upper Body Bathing: Simulated;Moderate assistance Where Assessed - Upper Body Bathing: Sitting, chair;Sitting, bed Lower Body Bathing: Maximal assistance Where Assessed - Lower Body Bathing: Sit to stand from chair;Sit to stand from bed Upper Body Dressing: Simulated;Minimal assistance Where Assessed - Upper Body Dressing: Sitting, chair;Sitting, bed Lower Body Dressing: Simulated;+1 Total assistance Where Assessed - Lower Body Dressing: Sit to stand from chair;Sit to stand from bed Toilet Transfer: Simulated;Moderate assistance Toilet Transfer Method: Stand pivot Acupuncturist: Extra wide bedside commode Toileting - Clothing Manipulation: Simulated;Maximal assistance Where Assessed - Glass blower/designer Manipulation: Standing Toileting - Hygiene: Not assessed Tub/Shower Transfer: Not assessed Equipment Used: Rolling walker Ambulation Related to ADLs: Pt. limited to bed to chair due to limited CPAP tubing ADL Comments: Pt. limited due to habitus and abdominal pain.  Pt. desats quickly to 86 on RA Vision/Perception  Vision - Assessment Vision Assessment: Vision not  tested Cognition Cognition Arousal/Alertness: Awake/alert Overall Cognitive Status: Appears within functional limits for tasks assessed Orientation Level: Oriented X4 Sensation/Coordination Sensation Light Touch: Appears Intact Coordination Gross Motor Movements are Fluid and Coordinated: Yes Fine Motor Movements are Fluid and Coordinated: Yes Extremity Assessment RUE Assessment RUE Assessment: Within Functional Limits LUE Assessment LUE Assessment: Within Functional Limits Mobility  Bed Mobility Bed Mobility: Yes Rolling Right: 4: Min assist;With rail Right Sidelying to Sit: 2: Max assist;With rails Transfers Transfers: Yes Sit to Stand: 3: Mod assist;With upper extremity assist;With armrests;From chair/3-in-1 Stand to Sit: 4: Min assist;With upper extremity assist;To bed Exercises   End of Session OT - End of Session Activity Tolerance: Patient limited by fatigue Patient left: in chair;with call bell in reach;with family/visitor present Nurse Communication: Mobility status for transfers General Behavior During Session: [facility] for tasks performed Cognition: [facility] for tasks performed   S[place]ow[place] M 10/15/2011, 9:33 PM

## 2011-10-15 NOTE — Progress Notes (Signed)
ANTIBIOTIC CONSULT NOTE - FOLLOW UP  Pharmacy Consult for ertapenem Indication: perforated diverticulitis  Vital Signs: Temp: 97.9 F (36.6 C) (01/21 0636) Temp src: Oral (01/20 2211) BP: 132/61 mmHg (01/21 0636) Pulse Rate: 73  (01/21 0636)  Intake/Output from previous day: 01/20 0701 - 01/21 0700 In: 2140 [P.O.:1440; I.V.:700] Out: 1410 [Urine:1050; Drains:325; Stool:35]  Labs:  [facility] 10/15/11 0500 10/13/11 0415  WBC 15.6* 16.7*  HGB 9.0* 9.0*  PLT 449* 412*  LABCREA -- --  CREATININE 0.52 0.54   Estimated Creatinine Clearance: 91.9 ml/min (by C-G formula based on Cr of 0.52). No results found for this basename: VANCOTROUGH:2,VANCOPEAK:2,VANCORANDOM:2,GENTTROUGH:2,GENTPEAK:2,GENTRANDOM:2,TOBRATROUGH:2,TOBRAPEAK:2,TOBRARND:2,AMIKACINPEAK:2,AMIKACINTROU:2,AMIKACIN:2, in the last 72 hours   Microbiology: Recent Results (from the past 720 hour(s))  URINE CULTURE     Status: Normal   Collection Time   10/09/11  4:48 AM      Component Value Range Status Comment   Specimen Description URINE, CATHETERIZED   Final    Special Requests NONE   Final    Setup Time [id number]   Final    Colony Count NO GROWTH   Final    Culture NO GROWTH   Final    Report Status 10/10/2011 FINAL   Final   MRSA PCR SCREENING     Status: Normal   Collection Time   10/09/11  1:39 PM      Component Value Range Status Comment   MRSA by PCR NEGATIVE  NEGATIVE  Final   CULTURE, BLOOD (ROUTINE X 2)     Status: Normal (Preliminary result)   Collection Time   10/09/11 10:12 PM      Component Value Range Status Comment   Specimen Description BLOOD LEFT HAND   Final    Special Requests BOTTLES DRAWN AEROBIC AND ANAEROBIC 10CC   Final    Setup Time [id number]   Final    Culture     Final    Value:        BLOOD CULTURE RECEIVED NO GROWTH TO DATE CULTURE WILL BE HELD FOR 5 DAYS BEFORE ISSUING A FINAL NEGATIVE REPORT   Report Status PENDING   Incomplete   CULTURE, BLOOD (ROUTINE X 2)     Status:  Normal (Preliminary result)   Collection Time   10/09/11 11:30 PM      Component Value Range Status Comment   Specimen Description BLOOD PICC LINE   Final    Special Requests BOTTLES DRAWN AEROBIC AND ANAEROBIC 10CC   Final    Setup Time [id number]   Final    Culture     Final    Value:        BLOOD CULTURE RECEIVED NO GROWTH TO DATE CULTURE WILL BE HELD FOR 5 DAYS BEFORE ISSUING A FINAL NEGATIVE REPORT   Report Status PENDING   Incomplete     Anti-infectives     Start     Dose/Rate Route Frequency Ordered Stop   10/10/11 0700   ertapenem (INVANZ) 1 g in sodium chloride 0.9 % 50 mL IVPB  Status:  Discontinued        1 g 100 mL/hr over 30 Minutes Intravenous Every 24 hours 10/09/11 1336 10/09/11 2022   10/09/11 2200   ertapenem (INVANZ) 1 g in sodium chloride 0.9 % 50 mL IVPB        1 g 100 mL/hr over 30 Minutes Intravenous Every 24 hours 10/09/11 2022     10/09/11 0730   ertapenem (INVANZ) 1 g in sodium chloride 0.9 % 50  mL IVPB        1 g 100 mL/hr over 30 Minutes Intravenous  Once 10/09/11 0715 10/09/11 0865          Assessment: 70 yof admitted with c/o abdominal pain at [facility] with fever and nausea. Transferred to [facility] and now s/p sigmoid bowel resection and colostomy. She is on day #6 of empiric ertapenem. Surgery recommended 7 days of antibiotic therapy. Dose is appropriate. WBC is elevated but continues to trend down. She is afebrile.   Plan:  1. Continue ertapenem 2. Consider DC ertapenem after 7 days of therapy 3. F/u restart of home meds  Dillin Lofgren, Drake Leach 10/15/2011,9:19 AM

## 2011-10-16 LAB — BASIC METABOLIC PANEL
BUN: 10 mg/dL (ref 6–23)
Calcium: 8.3 mg/dL — ABNORMAL LOW (ref 8.4–10.5)
Creatinine, Ser: 0.55 mg/dL (ref 0.50–1.10)
GFR calc Af Amer: >90 mL/min (ref >90)
GFR calc non Af Amer: >90 mL/min (ref >90)
Potassium: 3.5 mEq/L (ref 3.5–5.1)

## 2011-10-16 LAB — GLUCOSE, CAPILLARY: Glucose-Capillary: 123 mg/dL — ABNORMAL HIGH (ref 70–99)

## 2011-10-16 NOTE — Progress Notes (Signed)
Patient seen and examined.  Agree with PA's note.  Will begin discharge planning.

## 2011-10-16 NOTE — Progress Notes (Signed)
Physical Therapy Treatment Patient Details Name: LICHELLE VIETS MRN: [id number] DOB: 10/04/1940 Today's Date: 10/16/2011  PT Assessment/Plan  PT - Assessment/Plan Comments on Treatment Session: Pt agreeable to ambulation. Pt making slow progress. Spoke with patient reguarding STSNF v. HHPT. At this time still recommending STSNF.  PT Plan: Discharge plan remains appropriate PT Frequency: Min 3X/week Follow Up Recommendations: Skilled nursing facility;Home health PT;Supervision/Assistance - 24 hour Equipment Recommended: 3 in 1 bedside comode;Wheelchair (measurements) PT Goals  Acute Rehab PT Goals PT Goal: Supine/Side to Sit - Progress: Progressing toward goal PT Goal: Sit to Stand - Progress: Progressing toward goal PT Goal: Ambulate - Progress: Progressing toward goal  PT Treatment Precautions/Restrictions  Precautions Precautions: Fall Restrictions Weight Bearing Restrictions: No Other Position/Activity Restrictions: pt with Abdominal Wound Vac and Ostomy Mobility (including Balance) Bed Mobility Rolling Right: 3: Mod assist;With rail Rolling Right Details (indicate cue type and reason): A for trunk rotation and LE positioning.  Right Sidelying to Sit: 3: Mod assist;With rails;HOB elevated (comment degrees) (40%) Right Sidelying to Sit Details (indicate cue type and reason): A to position LEs and to assist shoulder up off of bed. Cues for safe technique and positioning. Transfers Sit to Stand: 4: Min assist;From bed;With upper extremity assist Sit to Stand Details (indicate cue type and reason): A for safety and balance. Cues for safe hand placement Stand to Sit: 4: Min assist;To chair/3-in-1;With upper extremity assist;With armrests Stand to Sit Details: A to control descend into chair. Cues for safe hand placement. Ambulation/Gait Ambulation/Gait Assistance: 4: Min assist Ambulation/Gait Assistance Details (indicate cue type and reason): A with management of RW and posture.    Ambulation Distance (Feet): 16 Feet Assistive device: Rolling walker Gait Pattern: Step-to pattern;Trunk flexed;Shuffle    Exercise    End of Session PT - End of Session Equipment Utilized During Treatment: Gait belt Activity Tolerance: Patient limited by fatigue Patient left: in chair;with call bell in reach Nurse Communication: Mobility status for transfers;Mobility status for ambulation General Behavior During Session: [facility] for tasks performed Cognition: [facility] for tasks performed  Robinette, Adline Potter 10/16/2011, 12:46 PM 10/16/2011 Fredrich Birks PTA [phone] pager [phone] office

## 2011-10-16 NOTE — Progress Notes (Signed)
7 Days Post-Op  Subjective: Pt ok. Up walking with PT at this time. Pain still present, using PAC. No N/V with fulls, ready to try more. Foley in place.  Objective: Vital signs in last 24 hours: Temp:  [97.4 F (36.3 C)-98.1 F (36.7 C)] 97.8 F (36.6 C) (01/22 0600) Pulse Rate:  [71-90] 90  (01/22 0600) Resp:  [16-20] 18  (01/22 0600) BP: (123-133)/(47-62) 123/47 mmHg (01/22 0600) SpO2:  [93 %-96 %] 96 % (01/22 0600) FiO2 (%):  [40 %] 40 % (01/21 1140) Last BM Date: 10/02/11  Intake/Output this shift:    Physical Exam: BP 123/47  Pulse 90  Temp(Src) 97.8 F (36.6 C) (Oral)  Resp 18  Ht 5\' 5"  (1.651 m)  Wt 143.6 kg (316 lb 9.3 oz)  BMI 52.68 kg/m2  SpO2 96% Abdomen: soft, ND. Ostomy viable, pink, gas and some liquid stool output. Wound Vac intact, no erythema  Labs: CBC  Basename 10/15/11 0500  WBC 15.6*  HGB 9.0*  HCT 30.4*  PLT 449*   BMET  Basename 10/16/11 0500 10/15/11 0500  NA 143 142  K 3.5 3.4*  CL 101 102  CO2 37* 35*  GLUCOSE 119* 124*  BUN 10 12  CREATININE 0.55 0.52  CALCIUM 8.3* 8.0*   LFT No results found for this basename: PROT,ALBUMIN,AST,ALT,ALKPHOS,BILITOT,BILIDIR,IBILI,LIPASE in the last 72 hours PT/INR No results found for this basename: LABPROT:2,INR:2 in the last 72 hours ABG  Basename 10/13/11 2218  PHART 7.332*  HCO3 29.2*    Studies/Results: No results found.  Assessment: Principal Problem:  *Diverticulitis of colon with perforation Active Problems:  Pneumoperitoneum - abdomen  Hypertension  Diabetes mellitus   Procedure(s): Partial Colectomy with Hartman's pouch and end colostomy.  Plan: Advance diet Continue Activity with PT Will try to remove Foley today.  LOS: 7 days    Alyse Low 10/16/2011

## 2011-10-17 LAB — CBC
Hemoglobin: 9 g/dL — ABNORMAL LOW (ref 12.0–15.0)
MCH: 23.7 pg — ABNORMAL LOW (ref 26.0–34.0)
Platelets: 441 10*3/uL — ABNORMAL HIGH (ref 150–400)
RBC: 3.79 MIL/uL — ABNORMAL LOW (ref 3.87–5.11)
WBC: 13.5 10*3/uL — ABNORMAL HIGH (ref 4.0–10.5)

## 2011-10-17 LAB — GLUCOSE, CAPILLARY
Glucose-Capillary: 104 mg/dL — ABNORMAL HIGH (ref 70–99)
Glucose-Capillary: 105 mg/dL — ABNORMAL HIGH (ref 70–99)

## 2011-10-17 LAB — BASIC METABOLIC PANEL
BUN: 10 mg/dL (ref 6–23)
Creatinine, Ser: 0.52 mg/dL (ref 0.50–1.10)
GFR calc Af Amer: >90 mL/min (ref >90)
GFR calc non Af Amer: >90 mL/min (ref >90)
Potassium: 3.7 mEq/L (ref 3.5–5.1)

## 2011-10-17 MED ORDER — OXYCODONE-ACETAMINOPHEN 5-325 MG PO TABS
1.0000 | ORAL_TABLET | ORAL | Status: DC | PRN
  Administered 2011-10-17 – 2011-10-19 (×6): 2 via ORAL
  Administered 2011-10-20: 1 via ORAL
  Administered 2011-10-20: 2 via ORAL
  Administered 2011-10-20 (×2): 1 via ORAL
  Administered 2011-10-21 – 2011-10-22 (×4): 2 via ORAL
  Administered 2011-10-22 (×2): 1 via ORAL
  Administered 2011-10-23 – 2011-10-24 (×2): 2 via ORAL
  Filled 2011-10-17: qty 1
  Filled 2011-10-17: qty 2
  Filled 2011-10-17: qty 1
  Filled 2011-10-17: qty 2
  Filled 2011-10-17: qty 1
  Filled 2011-10-17: qty 2
  Filled 2011-10-17: qty 1
  Filled 2011-10-17 (×11): qty 2

## 2011-10-17 MED ORDER — PANTOPRAZOLE SODIUM 40 MG PO TBEC
40.0000 mg | DELAYED_RELEASE_TABLET | Freq: Every day | ORAL | Status: DC
  Administered 2011-10-17 – 2011-10-24 (×8): 40 mg via ORAL
  Filled 2011-10-17 (×8): qty 1

## 2011-10-17 NOTE — Progress Notes (Signed)
PT CANCELLATION NOTE  Attempted to see patient this afternoon however patient just had sat up to eat lunch. Will recheck on patient in AM.  Thanks 10/17/2011 Fredrich Birks PTA [phone] pager [phone] office

## 2011-10-17 NOTE — Progress Notes (Signed)
8 Days Post-Op  Subjective: Tolerating diet.  Not having as much pain.  Objective: Vital signs in last 24 hours: Temp:  [97.3 F (36.3 C)-98.6 F (37 C)] 98 F (36.7 C) (01/23 0518) Pulse Rate:  [65-79] 69  (01/23 0518) Resp:  [18-20] 18  (01/23 0518) BP: (118-142)/(43-56) 118/56 mmHg (01/23 0518) SpO2:  [90 %-99 %] 90 % (01/23 0518) Weight:  [322 lb 15.6 oz (146.5 kg)] 322 lb 15.6 oz (146.5 kg) (01/23 0518) Last BM Date: 10/16/11  Intake/Output from previous day: 01/22 0701 - 01/23 0700 In: 2528 [P.O.:1080; I.V.:1398; IV Piggyback:50] Out: 1205 [Urine:1100; Drains:105] Intake/Output this shift:    PE: Abd-soft, open wound clean, colostomy pink with gas and thin liquid output  Lab Results:   Basename 10/17/11 0605 10/15/11 0500  WBC 13.5* 15.6*  HGB 9.0* 9.0*  HCT 30.3* 30.4*  PLT 441* 449*   BMET  Basename 10/17/11 0605 10/16/11 0500  NA 143 143  K 3.7 3.5  CL 100 101  CO2 37* 37*  GLUCOSE 131* 119*  BUN 10 10  CREATININE 0.52 0.55  CALCIUM 8.3* 8.3*   PT/INR No results found for this basename: LABPROT:2,INR:2 in the last 72 hours Comprehensive Metabolic Panel:    Component Value Date/Time   NA 143 10/17/2011 0605   K 3.7 10/17/2011 0605   CL 100 10/17/2011 0605   CO2 37* 10/17/2011 0605   BUN 10 10/17/2011 0605   CREATININE 0.52 10/17/2011 0605   GLUCOSE 131* 10/17/2011 0605   CALCIUM 8.3* 10/17/2011 0605   AST 32 10/12/2011 0411   ALT 30 10/12/2011 0411   ALKPHOS 98 10/12/2011 0411   BILITOT 0.2* 10/12/2011 0411   PROT 5.3* 10/12/2011 0411   ALBUMIN 1.8* 10/12/2011 0411     Studies/Results: No results found.  Anti-infectives: Anti-infectives     Start     Dose/Rate Route Frequency Ordered Stop   10/10/11 0700   ertapenem (INVANZ) 1 g in sodium chloride 0.9 % 50 mL IVPB  Status:  Discontinued        1 g 100 mL/hr over 30 Minutes Intravenous Every 24 hours 10/09/11 1336 10/09/11 2022   10/09/11 2200   ertapenem (INVANZ) 1 g in sodium chloride 0.9 % 50  mL IVPB        1 g 100 mL/hr over 30 Minutes Intravenous Every 24 hours 10/09/11 2022     10/09/11 0730   ertapenem (INVANZ) 1 g in sodium chloride 0.9 % 50 mL IVPB        1 g 100 mL/hr over 30 Minutes Intravenous  Once 10/09/11 0715 10/09/11 1610          Assessment Principal Problem:  *Diverticulitis of colon with perforation sigmoid colectomy and colostomy 1/15-slowly improving Active Problems:  Hypertension  Diabetes mellitus-controlled    LOS: 8 days   Plan: D/C PCA.  Oral analgesic.  Discharge planning. Change VAC.   Keegen Heffern J 10/17/2011

## 2011-10-17 NOTE — Progress Notes (Signed)
Clinical Social Worker received referral for SNF.  CSW completed psychosocial assessment, please see shadow chart for details.  Of note: Pt stated she wants to go home to receive PT.  CSW asked if pt would be interested in a program similar to [facility], pt stated "yes".  CSW spoke with [facility] representative who stated that is was "very unlikely" that pt's insurance would pay for [facility].  CSW spoke with PT about treatment recommendations.  PT to meet with pt in the morning.  CSW to continue to follow and assist as needed.  [name], MSW, [place] [phone]

## 2011-10-17 NOTE — Progress Notes (Signed)
Pt./husband voiced concerns about not being d/c'd to SNF. Stated they have appropriate insurance for HHPT,HHRN,and personal aide for 8hrs./day

## 2011-10-17 NOTE — Consult Note (Signed)
WOC follow up.   Pouch intact with minimal output, +flatus.  Pt reports her stomach is hurting her today but she has been up and is sitting up on side of bed.  Stoma pink and moist.  Pt and I are discussing her care for the ostomy. I have encouraged pt to empty with assistance, she is agreeable but fearful that she can not see to empty.  I will ask NT and RN's to encourage her to self empty as well.    WOC will follow  Darion Juhasz Marlena Clipper, [place] [phone]

## 2011-10-18 DIAGNOSIS — E66813 Obesity, class 3: Secondary | ICD-10-CM | POA: Diagnosis present

## 2011-10-18 LAB — GLUCOSE, CAPILLARY
Glucose-Capillary: 124 mg/dL — ABNORMAL HIGH (ref 70–99)
Glucose-Capillary: 123 mg/dL — ABNORMAL HIGH (ref 70–99)

## 2011-10-18 NOTE — Progress Notes (Signed)
Clinical Social Worker faxed pt's information to [facility] and submitted for a Psychologist, forensic.  CSW to continue to follow and assist as needed.   [name], MSW, [place] [phone]

## 2011-10-18 NOTE — Progress Notes (Signed)
Clinical Social Worker met with pt and pt's spouse at bedside.  CSW discussed plan of care with pt, pt is agreeable to SNF.  CSW will fax pt out to [facility].  [name], MSW, [place] [phone]

## 2011-10-18 NOTE — Progress Notes (Signed)
PT is using home CPAP unit without any problems. Rt will continue to monitor.

## 2011-10-18 NOTE — Progress Notes (Signed)
9 Days Post-Op  Subjective: Colostomy bag is full and uncomfortable.  Did not do PT because knee was sore.  Objective: Vital signs in last 24 hours: Temp:  [97.6 F (36.4 C)-99 F (37.2 C)] 97.9 F (36.6 C) (01/24 0549) Pulse Rate:  [65-91] 66  (01/24 0549) Resp:  [20] 20  (01/24 0549) BP: (107-154)/(54-80) 107/54 mmHg (01/24 0549) SpO2:  [91 %-95 %] 95 % (01/24 0549) Weight:  [322 lb 15.6 oz (146.5 kg)] 322 lb 15.6 oz (146.5 kg) (01/24 0549) Last BM Date: 10/18/11  Intake/Output from previous day: 01/23 0701 - 01/24 0700 In: 770 [P.O.:480; I.V.:240; IV Piggyback:50] Out: 1500 [Urine:1450; Stool:50] Intake/Output this shift:    PE: Abd-VAC on, colostomy pink with gas and liquid stool output  Lab Results:   [facility] 10/17/11 0605  WBC 13.5*  HGB 9.0*  HCT 30.3*  PLT 441*   BMET  Basename 10/17/11 0605 10/16/11 0500  NA 143 143  K 3.7 3.5  CL 100 101  CO2 37* 37*  GLUCOSE 131* 119*  BUN 10 10  CREATININE 0.52 0.55  CALCIUM 8.3* 8.3*   PT/INR No results found for this basename: LABPROT:2,INR:2 in the last 72 hours Comprehensive Metabolic Panel:    Component Value Date/Time   NA 143 10/17/2011 0605   K 3.7 10/17/2011 0605   CL 100 10/17/2011 0605   CO2 37* 10/17/2011 0605   BUN 10 10/17/2011 0605   CREATININE 0.52 10/17/2011 0605   GLUCOSE 131* 10/17/2011 0605   CALCIUM 8.3* 10/17/2011 0605   AST 32 10/12/2011 0411   ALT 30 10/12/2011 0411   ALKPHOS 98 10/12/2011 0411   BILITOT 0.2* 10/12/2011 0411   PROT 5.3* 10/12/2011 0411   ALBUMIN 1.8* 10/12/2011 0411     Studies/Results: No results found.  Anti-infectives: Anti-infectives     Start     Dose/Rate Route Frequency Ordered Stop   10/10/11 0700   ertapenem (INVANZ) 1 g in sodium chloride 0.9 % 50 mL IVPB  Status:  Discontinued        1 g 100 mL/hr over 30 Minutes Intravenous Every 24 hours 10/09/11 1336 10/09/11 2022   10/09/11 2200   ertapenem (INVANZ) 1 g in sodium chloride 0.9 % 50 mL IVPB        1  g 100 mL/hr over 30 Minutes Intravenous Every 24 hours 10/09/11 2022     10/09/11 0730   ertapenem (INVANZ) 1 g in sodium chloride 0.9 % 50 mL IVPB        1 g 100 mL/hr over 30 Minutes Intravenous  Once 10/09/11 0715 10/09/11 1610          Assessment Principal Problem:  *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  I do not feel it is safe for her to go home with therapies. Active Problems:  Hypertension  Diabetes mellitus-controlled    LOS: 9 days   Plan: Check WBC 1/25.  Continue IV abxs.  Continue to work on placement.   [name] J 10/18/2011

## 2011-10-18 NOTE — Progress Notes (Signed)
UR of chart completed. VAC application completed this am and faxed to [employer] at 1015am.

## 2011-10-18 NOTE — Progress Notes (Signed)
Occupational Therapy Treatment Patient Details Name: [name] MRN: [id number] DOB: 10-19-1940 Today's Date: 10/18/2011  OT Assessment/Plan OT Assessment/Plan Comments on Treatment Session: Discussed D/C plans with pt, including need for SNF for rehab. OT Plan: Discharge plan needs to be updated;Frequency needs to be updated OT Frequency: Min 1X/week Follow Up Recommendations: Skilled nursing facility Equipment Recommended: Defer to next venue OT Goals ADL Goals ADL Goal: Grooming - Progress: Progressing toward goals ADL Goal: Upper Body Bathing - Progress: Progressing toward goals ADL Goal: Lower Body Bathing - Progress: Progressing toward goals ADL Goal: Upper Body Dressing - Progress: Progressing toward goals ADL Goal: Lower Body Dressing - Progress: Progressing toward goals ADL Goal: Toilet Transfer - Progress: Progressing toward goals ADL Goal: Toileting - Clothing Manipulation - Progress: Progressing toward goals ADL Goal: Toileting - Hygiene - Progress: Progressing toward goals ADL Goal: Tub/Shower Transfer - Progress: Progressing toward goals ADL Goal: Additional Goal #1 - Progress: Progressing toward goals  OT Treatment Precautions/Restrictions  Restrictions Weight Bearing Restrictions: No   ADL ADL Eating/Feeding: Not assessed Toilet Transfer: Simulated;+2 Total assistance (pt unable to complete sit - stand without assist. ) Toilet Transfer Method: Stand pivot Acupuncturist: Extra wide bedside commode Toileting - Clothing Manipulation: +1 Total assistance Where Assessed - Toileting Clothing Manipulation: Standing (c/o pain during clothing manipulation) Toileting - Hygiene: +1 Total assistance Where Assessed - Toileting Hygiene: Standing Tub/Shower Transfer: Not assessed Ambulation Related to ADLs: limited due to pain and fatigue ADL Comments: Max - to total A with LB Mobility  Bed Mobility Bed Mobility: Yes (Max A sit -  supine) Transfers Transfers: Yes Sit to Stand: 1: +2 Total assist (from recliner)    End of Session OT - End of Session Activity Tolerance: Patient limited by fatigue;Patient limited by pain Patient left: in bed;with call bell in reach Nurse Communication: Mobility status for transfers General Behavior During Session: [facility] for tasks performed Cognition: [facility] for tasks performed  [facility]  10/18/2011, 8:40 AM [facility], OTR/L  [phone] 10/18/2011

## 2011-10-18 NOTE — Progress Notes (Signed)
ANTIBIOTIC CONSULT NOTE - FOLLOW UP  Pharmacy Consult for ertapenem Indication: perforated diverticulitis  Vital Signs: Temp: 97.9 F (36.6 C) (01/24 0549) Temp src: Oral (01/24 0549) BP: 107/54 mmHg (01/24 0549) Pulse Rate: 66  (01/24 0549)  Intake/Output from previous day: 01/23 0701 - 01/24 0700 In: 770 [P.O.:480; I.V.:240; IV Piggyback:50] Out: 1500 [Urine:1450; Stool:50]  Labs:  [facility] 10/17/11 0605 10/16/11 0500  WBC 13.5* --  HGB 9.0* --  PLT 441* --  LABCREA -- --  CREATININE 0.52 0.55   Estimated Creatinine Clearance: 93.1 ml/min (by C-G formula based on Cr of 0.52).   Microbiology: Recent Results (from the past 720 hour(s))  URINE CULTURE     Status: Normal   Collection Time   10/09/11  4:48 AM      Component Value Range Status Comment   Specimen Description URINE, CATHETERIZED   Final    Special Requests NONE   Final    Setup Time [id number]   Final    Colony Count NO GROWTH   Final    Culture NO GROWTH   Final    Report Status 10/10/2011 FINAL   Final   MRSA PCR SCREENING     Status: Normal   Collection Time   10/09/11  1:39 PM      Component Value Range Status Comment   MRSA by PCR NEGATIVE  NEGATIVE  Final   CULTURE, BLOOD (ROUTINE X 2)     Status: Normal   Collection Time   10/09/11 10:12 PM      Component Value Range Status Comment   Specimen Description BLOOD LEFT HAND   Final    Special Requests BOTTLES DRAWN AEROBIC AND ANAEROBIC 10CC   Final    Setup Time [id number]   Final    Culture NO GROWTH 5 DAYS   Final    Report Status 10/16/2011 FINAL   Final   CULTURE, BLOOD (ROUTINE X 2)     Status: Normal   Collection Time   10/09/11 11:30 PM      Component Value Range Status Comment   Specimen Description BLOOD PICC LINE   Final    Special Requests BOTTLES DRAWN AEROBIC AND ANAEROBIC 10CC   Final    Setup Time [id number]   Final    Culture NO GROWTH 5 DAYS   Final    Report Status 10/16/2011 FINAL   Final     Anti-infectives    Start     Dose/Rate Route Frequency Ordered Stop   10/10/11 0700   ertapenem (INVANZ) 1 g in sodium chloride 0.9 % 50 mL IVPB  Status:  Discontinued        1 g 100 mL/hr over 30 Minutes Intravenous Every 24 hours 10/09/11 1336 10/09/11 2022   10/09/11 2200   ertapenem (INVANZ) 1 g in sodium chloride 0.9 % 50 mL IVPB        1 g 100 mL/hr over 30 Minutes Intravenous Every 24 hours 10/09/11 2022     10/09/11 0730   ertapenem (INVANZ) 1 g in sodium chloride 0.9 % 50 mL IVPB        1 g 100 mL/hr over 30 Minutes Intravenous  Once 10/09/11 0715 10/09/11 0842          Assessment: 70 yof admitted with c/o abdominal pain at [facility] with fever and nausea. Transferred to [facility] and now s/p sigmoid bowel resection and colostomy. She is on day #9 of empiric ertapenem. Surgery recommended 7 days of antibiotic  therapy. Dose is appropriate. WBC is elevated but continues to trend down. She is afebrile.   Plan:  1. No dose changes needed 2. Consider DC ertapenem  Benny Lennert 10/18/2011,8:49 AM

## 2011-10-18 NOTE — Progress Notes (Signed)
PT CANCELLATION NOTE  Pt. Refused to get out of bed today. Pt stated that she "sprained" her knee getting out of chair and back to bed earlier this morning. Discussed with patient that at this time she is not safe to return home with her husband and that STSNF would continue to be the best option at discharge. RN made aware.   Thanks 10/18/2011 Fredrich Birks PTA [phone] pager [phone] office

## 2011-10-19 LAB — GLUCOSE, CAPILLARY: Glucose-Capillary: 115 mg/dL — ABNORMAL HIGH (ref 70–99)

## 2011-10-19 NOTE — Progress Notes (Signed)
10 Days Post-Op  Subjective: Pt feeling ok. Knee feels better. Denies much abd pain. No N/V Tol reg diet  Objective: Vital signs in last 24 hours: Temp:  [98 F (36.7 C)-99 F (37.2 C)] 99 F (37.2 C) (01/25 0541) Pulse Rate:  [66-75] 66  (01/25 0541) Resp:  [18-22] 18  (01/25 0541) BP: (143-145)/(51-68) 143/68 mmHg (01/25 0541) SpO2:  [90 %-94 %] 92 % (01/25 0541) Weight:  [147.6 kg (325 lb 6.4 oz)] 147.6 kg (325 lb 6.4 oz) (01/25 0500) Last BM Date: 10/18/11  Intake/Output this shift:    Physical Exam: BP 143/68  Pulse 66  Temp(Src) 99 F (37.2 C) (Oral)  Resp 18  Ht 5\' 5"  (1.651 m)  Wt 147.6 kg (325 lb 6.4 oz)  BMI 54.15 kg/m2  SpO2 92% Abdomen: soft, ND Wound vac intact. Ostomy viable, good soft stool output. 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 DC planning in process.  LOS: 10 days    Alyse Low 10/19/2011

## 2011-10-19 NOTE — Consult Note (Signed)
WOC ostomy  Stoma type/location: end colostomy, LLQ Stomal assessment/size: 1 1/2" round, flush with skin Peristomal assessment: intact without problems Treatment options for stomal/peristomal skin:utilizing 2" barrier ring around cut opening in wafer to create gentle convexity Output loose;brown fecal material in pouch Ostomy pouching: 1pc. Flexible with 2" barrier ring ,cut to fit at 1 1/2" round.   Education provided: pt to dc to SNF, I have encouraged pt to empty with assist from staff.  I do not believe she has yet done this.  With open abd. Wound and body habitus has made difficult for pt to see for further ostomy care.  Once she is more ambulatory may be able to participate.  NPWT V.A.C-open abdominal wound, assisted bedside nursing with dressing today. Wound closing in with 100% beefy red tissue present.    WOC will follow Thanks  [employer], CWOCN [phone])

## 2011-10-19 NOTE — Progress Notes (Signed)
Patient seen and examined.  Agree with PA's note.  VAC removed and wound looks good.  Will continue VAC.

## 2011-10-19 NOTE — Progress Notes (Signed)
Physical Therapy Treatment Patient Details Name: [name] MRN: [id number] DOB: 18-Mar-1941 Today's Date: 10/19/2011  PT Assessment/Plan  PT - Assessment/Plan Comments on Treatment Session: Pt c/o fatigue and unable to ambulate this session.  Progress continues to be slow moving but did notice pt retaining instruction from prior session.  PT Plan: Discharge plan remains appropriate;Frequency remains appropriate PT Frequency: Min 3X/week Follow Up Recommendations: Supervision/Assistance - 24 hour;Skilled nursing facility Equipment Recommended: Defer to next venue PT Goals  Acute Rehab PT Goals PT Goal Formulation: With patient Time For Goal Achievement: 2 weeks Pt will go Supine/Side to Sit: with supervision PT Goal: Supine/Side to Sit - Progress: Progressing toward goal Pt will go Sit to Supine/Side: with supervision PT Goal: Sit to Supine/Side - Progress: Progressing toward goal Pt will go Sit to Stand: with supervision PT Goal: Sit to Stand - Progress: Progressing toward goal Pt will Ambulate: 51 - 150 feet;with min assist;with rolling walker PT Goal: Ambulate - Progress: Not met Pt will Go Up / Down Stairs: 1-2 stairs;with min assist;with least restrictive assistive device PT Goal: Up/Down Stairs - Progress: Not met  PT Treatment Precautions/Restrictions  Precautions Precautions: Fall Restrictions Weight Bearing Restrictions: No Other Position/Activity Restrictions: pt with Abdominal Wound Vac and Ostomy Mobility (including Balance) Bed Mobility Bed Mobility: Yes Rolling Right: 3: Mod assist;With rail Rolling Right Details (indicate cue type and reason): assist for trunk rotation and Lt LE positioning.  Right Sidelying to Sit: 3: Mod assist;With rails;HOB elevated (comment degrees) Right Sidelying to Sit Details (indicate cue type and reason): Assist for bilater LEs due to weakness. Demonstrates retention or prior instruction in safe technique Sit to Supine: 3: Mod  assist;With rail Sit to Supine - Details (indicate cue type and reason): assist for bilateral LE due to LE and abdominal weakness and abdominal pain.   Scooting to [facility]: 1: +2 Total assist Transfers Transfers: Yes Sit to Stand: 1: +2 Total assist;From bed;From chair/3-in-1;With upper extremity assist;From elevated surface Sit to Stand Details (indicate cue type and reason): cues for anterior wt shift. Assist to manage pt's body wt due to LE weakness.   Stand to Sit: 4: Min assist;To chair/3-in-1;With upper extremity assist;With armrests Stand to Sit Details: assist to position hands on 3 in 1 and control descent. Stand Pivot Transfers: 3: Mod assist;From elevated surface;With armrests Stand Pivot Transfer Details (indicate cue type and reason): Cues for sequencing and technique. assist to stabilize pt.  assist to manage walker.  Ambulation/Gait Ambulation/Gait: No Ambulation/Gait Assistance Details (indicate cue type and reason): Pt declined ambulating due to fatigue from sitting up all morning Stairs: No  Balance Balance Assessed: No Exercise    End of Session PT - End of Session Activity Tolerance: Patient limited by fatigue Patient left: in bed;with call bell in reach Nurse Communication: Mobility status for transfers;Mobility status for ambulation General Behavior During Session: [facility] for tasks performed Cognition: [facility] for tasks performed  Avina Eberle 10/19/2011, 4:18 PM Myrick Mcnairy L. Jerrion Tabbert DPT [phone]

## 2011-10-19 NOTE — Progress Notes (Signed)
Clinical Social Worker discussed [facility]'s bed offer with pt (a preference of the pt).  Pt would like to accept bed offer to [facility].  CSW to assist with dc to SNF once medically stable.   Of note:  [facility] will need to order additional equipment for pt, which should arrive over the weekend or Monday morning.)    [name], MSW, [place] [phone]

## 2011-10-20 ENCOUNTER — Inpatient Hospital Stay (HOSPITAL_COMMUNITY): Payer: Medicare Other

## 2011-10-20 LAB — COMPREHENSIVE METABOLIC PANEL
Alkaline Phosphatase: 97 U/L (ref 39–117)
BUN: 9 mg/dL (ref 6–23)
CO2: 31 mEq/L (ref 19–32)
Chloride: 102 mEq/L (ref 96–112)
GFR calc Af Amer: >90 mL/min (ref >90)
GFR calc non Af Amer: 84 mL/min — ABNORMAL LOW (ref >90)
Glucose, Bld: 182 mg/dL — ABNORMAL HIGH (ref 70–99)
Potassium: 3.9 mEq/L (ref 3.5–5.1)
Total Bilirubin: 0.3 mg/dL (ref 0.3–1.2)
Total Protein: 5.3 g/dL — ABNORMAL LOW (ref 6.0–8.3)

## 2011-10-20 LAB — GLUCOSE, CAPILLARY

## 2011-10-20 LAB — CBC
HCT: 29.5 % — ABNORMAL LOW (ref 36.0–46.0)
Hemoglobin: 9 g/dL — ABNORMAL LOW (ref 12.0–15.0)
MCHC: 30.5 g/dL (ref 30.0–36.0)
RBC: 3.73 MIL/uL — ABNORMAL LOW (ref 3.87–5.11)

## 2011-10-20 LAB — MAGNESIUM: Magnesium: 1.8 mg/dL (ref 1.5–2.5)

## 2011-10-20 NOTE — Progress Notes (Addendum)
11 Days Post-Op  Subjective: Pt feeling ok. Up in chair, tolerating regular diet.  +soft stool in colostomy.  She is still on O2, and still on Invanz OR 10/09/11. She cannot see her colostomy due to body habitus Objective: Vital signs in last 24 hours: Temp:  [97.3 F (36.3 C)-98.1 F (36.7 C)] 97.3 F (36.3 C) (01/26 0603) Pulse Rate:  [65-73] 65  (01/26 0603) Resp:  [20] 20  (01/26 0603) BP: (132-140)/(49-66) 132/49 mmHg (01/26 0603) SpO2:  [94 %] 94 % (01/26 0603) Last BM Date: 10/20/11  Intake/Output this shift:    Physical Exam: BP 132/49  Pulse 65  Temp(Src) 97.3 F (36.3 C) (Oral)  Resp 20  Ht 5\' 5"  (1.651 m)  Wt 147.6 kg (325 lb 6.4 oz)  BMI 54.15 kg/m2  SpO2 94% Abdomen: soft, ND Wound vac intact. Ostomy viable, good soft stool output. Ext: no edema Chest is clear.  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  Hoping for SNF placement on Monday.  CXR shows some small pleural effusions, but nothing significant.     LOS: 11 days    Avigail Pilling PA-C 10/20/2011

## 2011-10-20 NOTE — Progress Notes (Addendum)
The patient is interviewed and examined. I agree with [name] note.  She is able to ambulate to the door and back with assistance. Limited by weakness, shortness of breath and obesity.  She states she is tolerating her diet and the colostomy is working well. She is anticipating discharge and transfer to skilled nursing facility in [place] on Monday.  We will check lab work today and a chest x-ray today before stopping her antibiotics.  [name]. Derrell Lolling, M.D., [facility] General and Minimally invasive Surgery Breast and Colorectal Surgery Office:   [phone] Pager:   [phone]

## 2011-10-21 ENCOUNTER — Inpatient Hospital Stay (HOSPITAL_COMMUNITY): Payer: Medicare Other

## 2011-10-21 DIAGNOSIS — D72828 Other elevated white blood cell count: Secondary | ICD-10-CM

## 2011-10-21 LAB — GLUCOSE, CAPILLARY
Glucose-Capillary: 109 mg/dL — ABNORMAL HIGH (ref 70–99)
Glucose-Capillary: 129 mg/dL — ABNORMAL HIGH (ref 70–99)
Glucose-Capillary: 89 mg/dL (ref 70–99)
Glucose-Capillary: 135 mg/dL — ABNORMAL HIGH (ref 70–99)

## 2011-10-21 MED ORDER — IOHEXOL 300 MG/ML  SOLN
100.0000 mL | Freq: Once | INTRAMUSCULAR | Status: AC | PRN
  Administered 2011-10-21: 100 mL via INTRAVENOUS

## 2011-10-21 MED ORDER — IOHEXOL 300 MG/ML  SOLN
20.0000 mL | INTRAMUSCULAR | Status: AC
  Administered 2011-10-21 (×2): 20 mL via ORAL

## 2011-10-21 NOTE — Progress Notes (Signed)
12 Days Post-Op  Subjective: Sleeping on CPAP, Good day yesterday.  Taking PO's well. Colostomy working well.  VAC on open abd. Wound.  WBC still up.  Other labs OK.  BJY:[id number] aeration and bilat pleural effusions.   Objective: Vital signs in last 24 hours: Temp:  [97.8 F (36.6 C)-98.8 F (37.1 C)] 97.8 F (36.6 C) (01/27 0618) Pulse Rate:  [74-76] 75  (01/27 0618) Resp:  [19-20] 19  (01/27 0618) BP: (130-137)/(48-56) 137/56 mmHg (01/27 0618) SpO2:  [93 %-95 %] 94 % (01/27 0618) Last BM Date: 10/20/11  Intake/Output this shift:    Physical Exam: BP 137/56  Pulse 75  Temp(Src) 97.8 F (36.6 C) (Oral)  Resp 19  Ht 5\' 5"  (1.651 m)  Wt 147.6 kg (325 lb 6.4 oz)  BMI 54.15 kg/m2  SpO2 94% Abdomen: soft, ND Wound vac intact. Ostomy viable, good soft stool output. Ext: no edema Chest is clear.  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 WBC still up and on [place].  Hoping to send to SNF tomorrow.      LOS: 12 days    Davionne Dowty PA-C 10/21/2011

## 2011-10-21 NOTE — Progress Notes (Signed)
ANTIBIOTIC CONSULT NOTE - FOLLOW UP  Pharmacy Consult for ertapenem Indication: perforated diverticulitis  Vital Signs: Temp: 97.8 F (36.6 C) (01/27 0618) Temp src: Oral (01/26 2125) BP: 137/56 mmHg (01/27 0618) Pulse Rate: 75  (01/27 0618)  Intake/Output from previous day: 01/26 0701 - 01/27 0700 In: 894.3 [P.O.:720; I.V.:174.3] Out: 4850 [Urine:4300; Stool:550]  Labs:  Basename 10/20/11 1300  WBC 14.7*  HGB 9.0*  PLT 355  LABCREA --  CREATININE 0.72   Estimated Creatinine Clearance: 90.9 ml/min (by C-G formula based on Cr of 0.72).   Microbiology: Recent Results (from the past 720 hour(s))  URINE CULTURE     Status: Normal   Collection Time   10/09/11  4:48 AM      Component Value Range Status Comment   Specimen Description URINE, CATHETERIZED   Final    Special Requests NONE   Final    Setup Time [id number]   Final    Colony Count NO GROWTH   Final    Culture NO GROWTH   Final    Report Status 10/10/2011 FINAL   Final   MRSA PCR SCREENING     Status: Normal   Collection Time   10/09/11  1:39 PM      Component Value Range Status Comment   MRSA by PCR NEGATIVE  NEGATIVE  Final   CULTURE, BLOOD (ROUTINE X 2)     Status: Normal   Collection Time   10/09/11 10:12 PM      Component Value Range Status Comment   Specimen Description BLOOD LEFT HAND   Final    Special Requests BOTTLES DRAWN AEROBIC AND ANAEROBIC 10CC   Final    Setup Time [id number]   Final    Culture NO GROWTH 5 DAYS   Final    Report Status 10/16/2011 FINAL   Final   CULTURE, BLOOD (ROUTINE X 2)     Status: Normal   Collection Time   10/09/11 11:30 PM      Component Value Range Status Comment   Specimen Description BLOOD PICC LINE   Final    Special Requests BOTTLES DRAWN AEROBIC AND ANAEROBIC 10CC   Final    Setup Time [id number]   Final    Culture NO GROWTH 5 DAYS   Final    Report Status 10/16/2011 FINAL   Final     Anti-infectives     Start     Dose/Rate Route Frequency Ordered  Stop   10/10/11 0700   ertapenem (INVANZ) 1 g in sodium chloride 0.9 % 50 mL IVPB  Status:  Discontinued        1 g 100 mL/hr over 30 Minutes Intravenous Every 24 hours 10/09/11 1336 10/09/11 2022   10/09/11 2200   ertapenem (INVANZ) 1 g in sodium chloride 0.9 % 50 mL IVPB        1 g 100 mL/hr over 30 Minutes Intravenous Every 24 hours 10/09/11 2022     10/09/11 0730   ertapenem (INVANZ) 1 g in sodium chloride 0.9 % 50 mL IVPB        1 g 100 mL/hr over 30 Minutes Intravenous  Once 10/09/11 0715 10/09/11 0842          Assessment: 70 yof admitted with c/o abdominal pain at [facility] with fever and nausea. Transferred to [facility] and now s/p sigmoid bowel resection and colostomy. She is on day #12 of empiric ertapenem. Dose is appropriate. WBC is elevated but continues to trend down. She is  afebrile.   Plan:  1. No dose changes needed 2. Consider DC ertapenem  Ulyses Southward [place] 10/21/2011,8:39 AM

## 2011-10-21 NOTE — Progress Notes (Signed)
Patient interviewed and examined. Agree with [name] note.  Lungs are clear. Abdomen is obese, soft, a little tender. Back in place. Colostomy with excellent output.  Because of leukocytosis, will perform CT of abdomen with contrast today to make sure that we had not missed a postop abscess.  [name]. Derrell Lolling, M.D., [facility] General and Minimally invasive Surgery Breast and Colorectal Surgery Office:   [phone] Pager:   [phone]

## 2011-10-22 ENCOUNTER — Inpatient Hospital Stay (HOSPITAL_COMMUNITY): Payer: Medicare Other

## 2011-10-22 ENCOUNTER — Encounter (HOSPITAL_COMMUNITY): Payer: Self-pay | Admitting: Radiology

## 2011-10-22 LAB — PROTIME-INR: Prothrombin Time: 13.6 seconds (ref 11.6–15.2)

## 2011-10-22 LAB — GLUCOSE, CAPILLARY
Glucose-Capillary: 113 mg/dL — ABNORMAL HIGH (ref 70–99)
Glucose-Capillary: 129 mg/dL — ABNORMAL HIGH (ref 70–99)
Glucose-Capillary: 88 mg/dL (ref 70–99)
Glucose-Capillary: 128 mg/dL — ABNORMAL HIGH (ref 70–99)

## 2011-10-22 MED ORDER — MIDAZOLAM HCL 5 MG/5ML IJ SOLN
INTRAMUSCULAR | Status: AC | PRN
  Administered 2011-10-22: 2 mg via INTRAVENOUS

## 2011-10-22 MED ORDER — FENTANYL CITRATE 0.05 MG/ML IJ SOLN
INTRAMUSCULAR | Status: AC | PRN
  Administered 2011-10-22: 50 ug via INTRAVENOUS
  Administered 2011-10-22 (×2): 25 ug via INTRAVENOUS

## 2011-10-22 MED ORDER — FENTANYL CITRATE 0.05 MG/ML IJ SOLN
INTRAMUSCULAR | Status: AC
  Filled 2011-10-22: qty 6

## 2011-10-22 MED ORDER — MIDAZOLAM HCL 2 MG/2ML IJ SOLN
INTRAMUSCULAR | Status: AC
  Filled 2011-10-22: qty 6

## 2011-10-22 NOTE — Progress Notes (Signed)
13 Days Post-Op  Subjective: WBC up and CT, obtained yesterday shows LLQ fluid collection 5.4x6.6x4.1cm.  Will ask IR to drain. She said she was a bit sore, but felt pretty good till Dr. Derrell Lolling talked to her.     Objective: Vital signs in last 24 hours: Temp:  [97.6 F (36.4 C)-98.3 F (36.8 C)] 98.3 F (36.8 C) (01/28 4098) Pulse Rate:  [64-68] 65  (01/28 0633) Resp:  [20] 20  (01/28 0633) BP: (122-134)/(56-80) 122/56 mmHg (01/28 0633) SpO2:  [92 %-94 %] 92 % (01/28 0633) Weight:  [144.4 kg (318 lb 5.5 oz)] 144.4 kg (318 lb 5.5 oz) (01/28 1191) Last BM Date: 10/21/11 (colostomy)  Intake/Output this shift: Total I/O In: 360 [P.O.:360] Out: 200 [Urine:200]  Physical Exam: BP 122/56  Pulse 65  Temp(Src) 98.3 F (36.8 C) (Oral)  Resp 20  Ht 5\' 5"  (1.651 m)  Wt 144.4 kg (318 lb 5.5 oz)  BMI 52.98 kg/m2  SpO2 92% Abdomen: soft, ND Wound vac intact. Ostomy viable, good soft stool output. Ext: +1edema Chest is clear.  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   LOS: 13 days    Hughes Wyndham PA-C 10/22/2011

## 2011-10-22 NOTE — Progress Notes (Signed)
OT Cancellation Note  Treatment cancelled today due to medical issues with patient which prohibited therapy. Pt scheduled for surgical procedure.  [facility] Benicia Bergevin, OTR/L  [phone] 10/22/2011 10/22/2011, 3:32 PM

## 2011-10-22 NOTE — Procedures (Signed)
Successful LLQ abscess drain with Ct fluoro No comp Bilious fld with debris aspirated ?? Biloma Keep to suction bulb Full report dictated in PACS

## 2011-10-22 NOTE — Progress Notes (Signed)
Physical Therapy Treatment Patient Details Name: [name] MRN: [id number] DOB: 09-18-1941 Today's Date: 10/22/2011  PT Assessment/Plan  PT - Assessment/Plan Comments on Treatment Session: Pt continues to be limited by fatiqued but agreeable to attempt ambulation with some encouragement PT Plan: Discharge plan remains appropriate PT Frequency: Min 3X/week Follow Up Recommendations: Skilled nursing facility;Supervision/Assistance - 24 hour Equipment Recommended: Defer to next venue PT Goals  Acute Rehab PT Goals PT Goal: Supine/Side to Sit - Progress: Progressing toward goal PT Goal: Sit to Supine/Side - Progress: Progressing toward goal PT Goal: Sit to Stand - Progress: Progressing toward goal PT Goal: Ambulate - Progress: Progressing toward goal  PT Treatment Precautions/Restrictions  Precautions Precautions: Fall Restrictions Weight Bearing Restrictions: No Other Position/Activity Restrictions: pt with Abdominal Wound Vac and Ostomy Mobility (including Balance) Bed Mobility Rolling Right: 4: Min assist;With rail Rolling Right Details (indicate cue type and reason): A for rotation of trunk. Cues for safe positioning Right Sidelying to Sit: 4: Min assist;With rails;HOB elevated (comment degrees) (30 degrees) Right Sidelying to Sit Details (indicate cue type and reason): A to position LE and to help bring shoulders up out of bed.  Transfers Sit to Stand: 4: Min assist;From bed;With upper extremity assist Sit to Stand Details (indicate cue type and reason): A to maintain balance. Cues to shift weight anteriorly.  Stand to Sit: 4: Min assist;With upper extremity assist;With armrests;To chair/3-in-1 Stand to Sit Details: A to control descent into recliner. Cues for safe hand placement Ambulation/Gait Ambulation/Gait Assistance: 4: Min assist Ambulation/Gait Assistance Details (indicate cue type and reason): A with managing RW and lines. +1 for safety and to follow with  recliner Ambulation Distance (Feet): 35 Feet Assistive device: Rolling walker Gait Pattern: Step-to pattern;Shuffle;Trunk flexed    Exercise    End of Session PT - End of Session Equipment Utilized During Treatment: Other (comment) (did not use gait belt dues to vac/colostomy.) Activity Tolerance: Patient limited by fatigue Patient left: in chair General Behavior During Session: [facility] for tasks performed Cognition: [facility] for tasks performed  Robinette, Adline Potter 10/22/2011, 11:10 AM 10/22/2011 Fredrich Birks PTA [phone] pager [phone] office

## 2011-10-22 NOTE — H&P (Signed)
[name] is an 71 y.o. female.   Chief Complaint: Left lower quadrant abscess HPI: diverticular abscess; colon resection 1/15 post perforation  Past Medical History  Diagnosis Date  . Hypertension   . Diabetes mellitus   . Arthritis     Past Surgical History  Procedure Date  . Abdominal hysterectomy   . Appendectomy   . Back surgery   . Colon resection 10/09/2011    Procedure: COLON RESECTION;  Surgeon: Atilano Ina, MD;  Location: [facility] OR;  Service: General;  Laterality: N/A;  Colon Resection with colostomy    History reviewed. No pertinent family history. Social History:  reports that she has been smoking.  She does not have any smokeless tobacco history on file. She reports that she does not drink alcohol or use illicit drugs.  Allergies:  Allergies  Allergen Reactions  . Codeine Itching    Medications Prior to Admission  Medication Dose Route Frequency Provider Last Rate Last Dose  . 0.9 %  sodium chloride infusion  250 mL Intravenous PRN Deatra Robinson, MD 20 mL/hr at 10/20/11 1352 250 mL at 10/20/11 1352  . aspirin suppository 300 mg  300 mg Rectal NOW Deatra Robinson, MD   300 mg at 10/09/11 2200  . chlorhexidine (PERIDEX) 0.12 % solution        15 mL at 10/15/11 0840  . diphenhydrAMINE (BENADRYL) injection 12.5 mg  12.5 mg Intravenous Q6H PRN Atilano Ina, MD   12.5 mg at 10/16/11 2135   Or  . diphenhydrAMINE (BENADRYL) 12.5 MG/5ML elixir 12.5 mg  12.5 mg Oral Q6H PRN Atilano Ina, MD      . ertapenem [facility]) 1 g in sodium chloride 0.9 % 50 mL IVPB  1 g Intravenous Once Carlisle Beers Molpus, MD   1 g at 10/09/11 0729  . ertapenem (INVANZ) 1 g in sodium chloride 0.9 % 50 mL IVPB  1 g Intravenous Q24H Atilano Ina, MD   1 g at 10/21/11 2150  . fentaNYL (SUBLIMAZE) injection 50 mcg  50 mcg Intravenous Once Carlisle Beers Molpus, MD   50 mcg at 10/09/11 0610  . furosemide (LASIX) injection 40 mg  40 mg Intravenous Once Cherylynn Ridges III, MD   40 mg at 10/13/11 2244  . heparin  injection 5,000 Units  5,000 Units Subcutaneous Q8H Sherrie George, [place]   5,000 Units at 10/22/11 0744  . insulin aspart (novoLOG) injection 0-4 Units  0-4 Units Subcutaneous TID WC Valarie Merino, MD   1 Units at 10/21/11 1314  . iohexol (OMNIPAQUE) 300 MG/ML solution 100 mL  100 mL Intravenous Once PRN Medication Radiologist, MD   100 mL at 10/21/11 1704  . iohexol (OMNIPAQUE) 300 MG/ML solution 120 mL  120 mL Intravenous Once PRN Attending Physician Inpatient, MD   120 mL at 10/09/11 0707  . iohexol (OMNIPAQUE) 300 MG/ML solution 20 mL  20 mL Oral Q1 Hr x 2 Medication Radiologist, MD   20 mL at 10/21/11 1314  . naloxone [facility]) injection 0.4 mg  0.4 mg Intravenous PRN Atilano Ina, MD       And  . sodium chloride 0.9 % injection 9 mL  9 mL Intravenous PRN Atilano Ina, MD   10 mL at 10/21/11 2320  . ondansetron (ZOFRAN) injection 4 mg  4 mg Intravenous Once Marianna Fuss, PA   4 mg at 10/09/11 1151  . ondansetron (ZOFRAN) injection 4 mg  4 mg Intravenous Q6H PRN  Atilano Ina, MD   4 mg at 10/15/11 0003  . oxyCODONE-acetaminophen (PERCOCET) 5-325 MG per tablet 1-2 tablet  1-2 tablet Oral Q4H PRN Adolph Pollack, MD   2 tablet at 10/22/11 1031  . pantoprazole (PROTONIX) EC tablet 40 mg  40 mg Oral Q1200 [facility], PHARMD   40 mg at 10/22/11 1031  . phenol (CHLORASEPTIC) mouth spray 1 spray  1 spray Mouth/Throat PRN Billy Fischer, MD      . promethazine (PHENERGAN) injection 12.5 mg  12.5 mg Intravenous Once Raiford Simmonds, MD   25 mg at 10/09/11 1421  . sodium chloride 0.9 % 1,000 mL with potassium chloride 30 mEq infusion   Intravenous Continuous Adolph Pollack, MD 20 mL/hr at 10/20/11 1443    . sodium chloride 0.9 % bolus 1,000 mL  1,000 mL Intravenous Once Rakesh V. Vassie Loll, MD   1,000 mL at 10/09/11 2345  . sodium chloride 0.9 % bolus 500 mL  500 mL Intravenous Once Sherrie George, PA   500 mL at 10/10/11 0915  . sodium chloride 0.9 % injection 10 mL  10 mL  Intravenous Q12H Almond Lint, MD   10 mL at 10/17/11 0612  . DISCONTD: 0.45 % NaCl with KCl 20 mEq / L infusion   Intravenous Continuous Atilano Ina, MD      . DISCONTD: 0.9 %  sodium chloride infusion   Intravenous Continuous Carlisle Beers Molpus, MD      . DISCONTD: 0.9 %  sodium chloride infusion    Continuous PRN Andree Elk, CRNA      . DISCONTD: 0.9 %  sodium chloride infusion   Intravenous Continuous Cherylynn Ridges III, MD 50 mL/hr at 10/15/11 0839 1,000 mL at 10/15/11 0839  . DISCONTD: 0.9 % irrigation (POUR BTL)    PRN Atilano Ina, MD   1,000 mL at 10/09/11 1653  . DISCONTD: acetaminophen (TYLENOL) suppository 650 mg  650 mg Rectal Q6H PRN Marianna Fuss, PA      . DISCONTD: acetaminophen (TYLENOL) tablet 650 mg  650 mg Oral Q6H PRN Marianna Fuss, PA      . DISCONTD: antiseptic oral rinse (BIOTENE) solution 15 mL  15 mL Mouth Rinse BID Atilano Ina, MD   15 mL at 10/09/11 2000  . DISCONTD: aspirin chewable tablet 324 mg  324 mg Oral NOW Deatra Robinson, MD      . DISCONTD: dexmedetomidine (PRECEDEX) 200 mcg in sodium chloride 0.9 % 50 mL infusion  0.4-1.2 mcg/kg/hr Intravenous Titrated Joaquin Courts, MD      . DISCONTD: dexmedetomidine (PRECEDEX) 200 mcg in sodium chloride 0.9 % 50 mL infusion  200 mcg  Continuous PRN Andree Elk, CRNA 23.7 mL/hr at 10/09/11 1930 0.7 mcg/kg/hr at 10/09/11 1930  . DISCONTD: dexmedetomidine (PRECEDEX) 200 mcg in sodium chloride 0.9 % 50 mL infusion  0.2-1.2 mcg/kg/hr Intravenous Continuous Deatra Robinson, MD 23.7 mL/hr at 10/09/11 2000 0.7 mcg/kg/hr at 10/09/11 2000  . DISCONTD: dexmedetomidine (PRECEDEX) 200 mcg in sodium chloride 0.9 % 50 mL infusion  0.2-0.7 mcg/kg/hr Intravenous Continuous Deatra Robinson, MD      . DISCONTD: dexmedetomidine (PRECEDEX) 200 mcg in sodium chloride 0.9 % 50 mL infusion  0.2-0.7 mcg/kg/hr Intravenous Continuous Deatra Robinson, MD 33.9 mL/hr at 10/09/11 2100 1 mcg/kg/hr at 10/09/11 2100  . DISCONTD:  dexmedetomidine (PRECEDEX) 400 mcg in sodium chloride 0.9 % 100 mL infusion  0.4-1.2 mcg/kg/hr Intravenous Titrated Benny Lennert, PHARMD 23.7 mL/hr  at 10/10/11 1000 0.7 mcg/kg/hr at 10/10/11 1000  . DISCONTD: dextrose 10 % infusion   Intravenous Continuous Deatra Robinson, MD      . DISCONTD: diphenhydrAMINE (BENADRYL) 12.5 MG/5ML elixir 12.5 mg  12.5 mg Oral Q6H PRN Marianna Fuss, PA      . DISCONTD: diphenhydrAMINE (BENADRYL) injection 12.5 mg  12.5 mg Intravenous Q6H PRN Marianna Fuss, PA      . DISCONTD: ertapenem (INVANZ) 1 g in sodium chloride 0.9 % 50 mL IVPB  1 g Intravenous Q24H Marianna Fuss, PA      . DISCONTD: fentaNYL (SUBLIMAZE) 10 mcg/mL in sodium chloride 0.9 % 250 mL infusion  50-300 mcg/hr Intravenous Titrated Atilano Ina, MD      . DISCONTD: fentaNYL (SUBLIMAZE) 10 mcg/mL in sodium chloride 0.9 % 250 mL infusion  50-300 mcg/hr Intravenous Titrated Deatra Robinson, MD      . DISCONTD: fentaNYL (SUBLIMAZE) bolus via infusion 50-100 mcg  50-100 mcg Intravenous Q6H PRN Atilano Ina, MD      . DISCONTD: fentaNYL (SUBLIMAZE) bolus via infusion 50-100 mcg  50-100 mcg Intravenous Q6H PRN Deatra Robinson, MD      . DISCONTD: fentaNYL (SUBLIMAZE) injection 12.5-25 mcg  12.5-25 mcg Intravenous Q2H PRN Billy Fischer, MD      . DISCONTD: fentaNYL (SUBLIMAZE) injection 25-50 mcg  25-50 mcg Intravenous Q2H PRN Deatra Robinson, MD   50 mcg at 10/10/11 0815  . DISCONTD: fentaNYL (SUBLIMAZE) injection    PRN Andree Elk, CRNA   100 mcg at 10/09/11 1950  . DISCONTD: heparin injection 5,000 Units  5,000 Units Subcutaneous Q8H Deatra Robinson, MD      . DISCONTD: hetastarch in lactated electrolyte (HEXTEND) 6 % infusion    Continuous PRN Andree Elk, CRNA      . DISCONTD: HYDROmorphone (DILAUDID) injection 1-2 mg  1-2 mg Intravenous Q3H PRN Marianna Fuss, PA      . DISCONTD: insulin aspart (novoLOG) injection 0-20 Units  0-20 Units Subcutaneous TID WC Marianna Fuss, PA       . DISCONTD: insulin aspart (novoLOG) injection 0-20 Units  0-20 Units Subcutaneous TID WC Atilano Ina, MD      . DISCONTD: insulin aspart (novoLOG) injection 0-4 Units  0-4 Units Subcutaneous Q4H Deatra Robinson, MD   1 Units at 10/14/11 1256  . DISCONTD: insulin aspart (novoLOG) injection 0-5 Units  0-5 Units Subcutaneous QHS Atilano Ina, MD      . DISCONTD: insulin aspart (novoLOG) injection 3 Units  3 Units Subcutaneous Q4H Deatra Robinson, MD      . DISCONTD: lactated ringers infusion   Intravenous Continuous Raiford Simmonds, MD 50 mL/hr at 10/09/11 1414    . DISCONTD: lactated ringers infusion    Continuous PRN Andree Elk, CRNA      . DISCONTD: midazolam (VERSED) 5 MG/5ML injection    PRN Andree Elk, CRNA   2 mg at 10/09/11 1950  . DISCONTD: midazolam (VERSED) injection 1-2 mg  1-2 mg Intravenous Q1H PRN Deatra Robinson, MD   2 mg at 10/10/11 0212  . DISCONTD: midazolam (VERSED) injection 2-4 mg  2-4 mg Intravenous Q2H PRN Deatra Robinson, MD      . DISCONTD: morphine 1 MG/ML PCA injection   Intravenous Q4H Atilano Ina, MD   2 mL at 10/17/11 0842  . DISCONTD: norepinephrine (LEVOPHED) 8,000 mcg in dextrose 5 % 250 mL infusion  2-50 mcg/min Intravenous Continuous Shelba Flake, MD  11.3 mL/hr at 10/10/11 1000 6 mcg/min at 10/10/11 1000  . DISCONTD: norepinephrine (LEVOPHED) 8,000 mcg in dextrose 5 % 250 mL infusion  2-50 mcg/min Intravenous Continuous Atilano Ina, MD   1.333 mcg/min at 10/11/11 2300  . DISCONTD: ondansetron (ZOFRAN) injection 4 mg  4 mg Intravenous Q6H PRN Marianna Fuss, PA   4 mg at 10/09/11 1303  . DISCONTD: ondansetron (ZOFRAN) injection 4 mg  4 mg Intravenous Q6H PRN Atilano Ina, MD      . DISCONTD: ondansetron [facility]) tablet 4 mg  4 mg Oral Q6H PRN Atilano Ina, MD      . DISCONTD: pantoprazole (PROTONIX) injection 40 mg  40 mg Intravenous QHS Marianna Fuss, PA      . DISCONTD: pantoprazole (PROTONIX) injection 40 mg  40 mg  Intravenous QHS Deatra Robinson, MD   40 mg at 10/16/11 2130  . DISCONTD: propofol (DIPRIVAN) 10 MG/ML infusion    PRN Andree Elk, CRNA   160 mg at 10/09/11 1537  . DISCONTD: propofol (DIPRIVAN) 10 MG/ML infusion  5-50 mcg/kg/min Intravenous Titrated Atilano Ina, MD      . DISCONTD: rocuronium [facility]) injection    PRN Andree Elk, CRNA   10 mg at 10/09/11 1753  . DISCONTD: sodium chloride 0.9 % injection 10 mL  10 mL Intracatheter PRN Atilano Ina, MD   10 mL at 10/16/11 0502  . DISCONTD: sodium chloride 0.9 % injection 10 mL  10 mL Intravenous PRN Almond Lint, MD   10 mL at 10/14/11 1115  . DISCONTD: succinylcholine (ANECTINE) injection    PRN Andree Elk, CRNA   100 mg at 10/09/11 1537   No current outpatient prescriptions on file as of 10/22/2011.    Results for orders placed during the hospital encounter of 10/09/11 (from the past 48 hour(s))  GLUCOSE, CAPILLARY     Status: Abnormal   Collection Time   10/20/11 11:29 AM      Component Value Range Comment   Glucose-Capillary 164 (*) 70 - 99 (mg/dL)    Comment 1 Notify RN     CBC     Status: Abnormal   Collection Time   10/20/11  1:00 PM      Component Value Range Comment   WBC 14.7 (*) 4.0 - 10.5 (K/uL)    RBC 3.73 (*) 3.87 - 5.11 (MIL/uL)    Hemoglobin 9.0 (*) 12.0 - 15.0 (g/dL)    HCT 45.4 (*) 09.8 - 46.0 (%)    MCV 79.1  78.0 - 100.0 (fL)    MCH 24.1 (*) 26.0 - 34.0 (pg)    MCHC 30.5  30.0 - 36.0 (g/dL)    RDW 11.9 (*) 14.7 - 15.5 (%)    Platelets 355  150 - 400 (K/uL)   COMPREHENSIVE METABOLIC PANEL     Status: Abnormal   Collection Time   10/20/11  1:00 PM      Component Value Range Comment   Sodium 140  135 - 145 (mEq/L)    Potassium 3.9  3.5 - 5.1 (mEq/L)    Chloride 102  96 - 112 (mEq/L)    CO2 31  19 - 32 (mEq/L)    Glucose, Bld 182 (*) 70 - 99 (mg/dL)    BUN 9  6 - 23 (mg/dL)    Creatinine, Ser 8.29  0.50 - 1.10 (mg/dL)    Calcium 7.8 (*) 8.4 - 10.5 (mg/dL)    Total Protein 5.3 (*)  6.0 -  8.3 (g/dL)    Albumin 2.1 (*) 3.5 - 5.2 (g/dL)    AST 17  0 - 37 (U/L)    ALT 21  0 - 35 (U/L)    Alkaline Phosphatase 97  39 - 117 (U/L)    Total Bilirubin 0.3  0.3 - 1.2 (mg/dL)    GFR calc non Af Amer 84 (*) >90 (mL/min)    GFR calc Af Amer >90  >90 (mL/min)   MAGNESIUM     Status: Normal   Collection Time   10/20/11  1:00 PM      Component Value Range Comment   Magnesium 1.8  1.5 - 2.5 (mg/dL)   GLUCOSE, CAPILLARY     Status: Abnormal   Collection Time   10/20/11  5:17 PM      Component Value Range Comment   Glucose-Capillary 134 (*) 70 - 99 (mg/dL)    Comment 1 Notify RN     GLUCOSE, CAPILLARY     Status: Abnormal   Collection Time   10/20/11 10:24 PM      Component Value Range Comment   Glucose-Capillary 155 (*) 70 - 99 (mg/dL)    Comment 1 Notify RN      Comment 2 Documented in Chart     GLUCOSE, CAPILLARY     Status: Abnormal   Collection Time   10/21/11  8:02 AM      Component Value Range Comment   Glucose-Capillary 109 (*) 70 - 99 (mg/dL)    Comment 1 Documented in Chart      Comment 2 Notify RN     GLUCOSE, CAPILLARY     Status: Abnormal   Collection Time   10/21/11 12:03 PM      Component Value Range Comment   Glucose-Capillary 129 (*) 70 - 99 (mg/dL)    Comment 1 Documented in Chart      Comment 2 Notify RN     GLUCOSE, CAPILLARY     Status: Normal   Collection Time   10/21/11  3:25 PM      Component Value Range Comment   Glucose-Capillary 89  70 - 99 (mg/dL)    Comment 1 Documented in Chart      Comment 2 Notify RN     GLUCOSE, CAPILLARY     Status: Abnormal   Collection Time   10/21/11  9:54 PM      Component Value Range Comment   Glucose-Capillary 135 (*) 70 - 99 (mg/dL)    Comment 1 Notify RN      Comment 2 Documented in Chart     GLUCOSE, CAPILLARY     Status: Abnormal   Collection Time   10/22/11  7:50 AM      Component Value Range Comment   Glucose-Capillary 113 (*) 70 - 99 (mg/dL)    Ct Abdomen Pelvis W Contrast  10/21/2011  *RADIOLOGY REPORT*   Clinical Data: Leukocytosis.  Evaluate for abscess  CT ABDOMEN AND PELVIS WITH CONTRAST  Technique:  Multidetector CT imaging of the abdomen and pelvis was performed following the standard protocol during bolus administration of intravenous contrast.  Contrast: OMNIPAQUE IOHEXOL 300 MG/ML IV SOLN  Comparison: 10/10/1928  Findings: Small bilateral pleural effusions are noted right greater than left.  Overlying atelectasis/consolidation noted.  There is no focal splenic abnormality.  The adrenal glands are both normal.  The liver appears normal.  Stone is noted within the lumen of the gallbladder measuring 0.8 mm.  No gallbladder  wall thickening or pericholecystic fluid.  There are scattered pancreatic parenchymal calcifications which may be the sequela of chronic pancreatitis.  No biliary dilatation is identified.  Normal appearance of the kidneys.  No upper abdominal adenopathy.  There is no pelvic or inguinal adenopathy.  The urinary bladder appears normal.  There is an open ventral abdominal wall wound.  A sigmoidectomy has been performed with the left lower quadrant colostomy.   Ingested enteric contrast material is identified within normal caliber small bowel loops and in the proximal colon.    Within the left lower quadrant of the abdomen there is a focal fluid collection which measures 5.4 x 6.6 X 4.1 cm.  Mild lateral body wall edema is noted bilaterally.  Review of the visualized osseous structures is significant for mild lumbar spondylosis.  IMPRESSION:  1.  Left lower quadrant focal fluid collection is identified.  This may represent early abscess formation. 2.  Status post sigmoidectomy with left lower quadrant colostomy.  3.  Open ventral abdominal wall wound. 4.  Gallstones.  Original Report Authenticated By: Rosealee Albee, M.D.    ROS  Blood pressure 122/56, pulse 65, temperature 98.3 F (36.8 C), temperature source Oral, resp. rate 20, height 5\' 5"  (1.651 m), weight 318 lb 5.5 oz  (144.4 kg), SpO2 92.00%. Physical Exam   Assessment/Plan Colon resection after perforation; diverticular abscess Scheduled now for LLQ abscess drain Pt aware of procedure benefits and risks and agreeable to proceed. Consent in chart  Anuhea Gassner A 10/22/2011, 11:02 AM

## 2011-10-22 NOTE — Progress Notes (Signed)
Agree with [name] note. See my note from earlier today.  Plan percutaneous drainage of left lower quadrant fluid collection. Continue antibiotics.   [name]. Derrell Lolling, M.D., [facility] General and Minimally invasive Surgery Breast and Colorectal Surgery Office:   [phone] Pager:   [phone]

## 2011-10-22 NOTE — Progress Notes (Signed)
Clinical Social Worker spoke with [name] at [facility] to update on [facility].  Per RNCM, dc is delayed due to discovery of new abscesses.  CSW to continue to follow and assist as needed.  [name], MSW, [facility] [phone]

## 2011-10-23 LAB — GLUCOSE, CAPILLARY
Glucose-Capillary: 107 mg/dL — ABNORMAL HIGH (ref 70–99)
Glucose-Capillary: 152 mg/dL — ABNORMAL HIGH (ref 70–99)

## 2011-10-23 NOTE — Progress Notes (Signed)
Patient ID: [name], female   DOB: [birth year], 71 y.o.   MRN: [id number] 14 Days Post-Op  Subjective: Pt feels better.  Objective: Vital signs in last 24 hours: Temp:  [97.9 F (36.6 C)-98.9 F (37.2 C)] 98 F (36.7 C) (01/29 0530) Pulse Rate:  [64-74] 64  (01/29 0530) Resp:  [18-28] 20  (01/29 0530) BP: (112-147)/(49-59) 147/51 mmHg (01/29 0530) SpO2:  [90 %-97 %] 90 % (01/29 0530) Weight:  [315 lb 14.7 oz (143.3 kg)] 315 lb 14.7 oz (143.3 kg) (01/29 0530) Last BM Date: 10/22/11  Intake/Output from previous day: 01/28 0701 - 01/29 0700 In: 839 [P.O.:360; I.V.:479] Out: 2725 [Urine:2000; Drains:75; Stool:650] Intake/Output this shift: Total I/O In: 360 [P.O.:360] Out: -   PE: Abd: soft, some tenderness around drain.  JP with non purulent output.  +BS, ND, ostomy with good output.  Lab Results:   Basename 10/20/11 1300  WBC 14.7*  HGB 9.0*  HCT 29.5*  PLT 355   BMET  Basename 10/20/11 1300  NA 140  K 3.9  CL 102  CO2 31  GLUCOSE 182*  BUN 9  CREATININE 0.72  CALCIUM 7.8*   PT/INR  Basename 10/22/11 1140  LABPROT 13.6  INR 1.02     Studies/Results: Ct Guided Abscess Drain  10/22/2011  *RADIOLOGY REPORT*  Clinical Data: Left lower quadrant fluid collection, concerning for abscess, status post colostomy, history of recent perforated sigmoid diverticulitis  CT FLUOROSCOPY GUIDED LEFT LOWER QUADRANT ABSCESS DRAIN PLACEMENT  Date:  10/22/2011 14:05:00  Radiologist:  Judie Petit. Ruel Favors, M.D.  Medications:  2 mg Versed, 100 mcg Fentanyl  Guidance:  CT fluoroscopy  Fluoroscopy time:  4 seconds  Sedation time:  25 minutes  Contrast volume:  None.  Complications:  No immediate  PROCEDURE/FINDINGS:  Informed consent was obtained from the patient following explanation of the procedure, risks, benefits and alternatives. The patient understands, agrees and consents for the procedure. All questions were addressed.  A time out was performed.  Maximal barrier sterile technique  utilized including caps, mask, sterile gowns, sterile gloves, large sterile drape, hand hygiene, and betadine  Previous imaging reviewed demonstrating the left lower quadrant fluid collection.  The patient was positioned supine.  Noncontrast localization CT performed through the lower abdomen pelvis.  Left lower quadrant fluid collection identified.  Under sterile conditions and local anesthesia,  CT fluoroscopy was utilized to advance a 17 gauge 11.8 cm access needle into the fluid collection. There was return of exudative bilious fluid.  Sample sent for Gram stain and culture.  Guide wire advanced followed tract dilatation to insert a 10-French drain.  Drain catheter position confirmed in the collection.  Syringe aspiration yielded 30 ml fluid.  The catheter was secured with a Prolene suture and connected to external suction bulb.  Sterile dressing applied.  No immediate complication.  The patient tolerated the procedure well.  IMPRESSION: Successful CT fluoroscopy guided left lower quadrant abscess drain placement with insertion of a 10-French drain.  30 ml of bilious exudative fluid removed.  Gram stain cultures sent.  Original Report Authenticated By: Judie Petit. Ruel Favors, M.D.   Ct Abdomen Pelvis W Contrast  10/21/2011  *RADIOLOGY REPORT*  Clinical Data: Leukocytosis.  Evaluate for abscess  CT ABDOMEN AND PELVIS WITH CONTRAST  Technique:  Multidetector CT imaging of the abdomen and pelvis was performed following the standard protocol during bolus administration of intravenous contrast.  Contrast: OMNIPAQUE IOHEXOL 300 MG/ML IV SOLN  Comparison: 10/10/1928  Findings: Small bilateral  pleural effusions are noted right greater than left.  Overlying atelectasis/consolidation noted.  There is no focal splenic abnormality.  The adrenal glands are both normal.  The liver appears normal.  Stone is noted within the lumen of the gallbladder measuring 0.8 mm.  No gallbladder wall thickening or pericholecystic fluid.   There are scattered pancreatic parenchymal calcifications which may be the sequela of chronic pancreatitis.  No biliary dilatation is identified.  Normal appearance of the kidneys.  No upper abdominal adenopathy.  There is no pelvic or inguinal adenopathy.  The urinary bladder appears normal.  There is an open ventral abdominal wall wound.  A sigmoidectomy has been performed with the left lower quadrant colostomy.   Ingested enteric contrast material is identified within normal caliber small bowel loops and in the proximal colon.    Within the left lower quadrant of the abdomen there is a focal fluid collection which measures 5.4 x 6.6 X 4.1 cm.  Mild lateral body wall edema is noted bilaterally.  Review of the visualized osseous structures is significant for mild lumbar spondylosis.  IMPRESSION:  1.  Left lower quadrant focal fluid collection is identified.  This may represent early abscess formation. 2.  Status post sigmoidectomy with left lower quadrant colostomy.  3.  Open ventral abdominal wall wound. 4.  Gallstones.  Original Report Authenticated By: Rosealee Albee, M.D.    Anti-infectives: Anti-infectives     Start     Dose/Rate Route Frequency Ordered Stop   10/10/11 0700   ertapenem (INVANZ) 1 g in sodium chloride 0.9 % 50 mL IVPB  Status:  Discontinued        1 g 100 mL/hr over 30 Minutes Intravenous Every 24 hours 10/09/11 1336 10/09/11 2022   10/09/11 2200   ertapenem (INVANZ) 1 g in sodium chloride 0.9 % 50 mL IVPB        1 g 100 mL/hr over 30 Minutes Intravenous Every 24 hours 10/09/11 2022     10/09/11 0730   ertapenem (INVANZ) 1 g in sodium chloride 0.9 % 50 mL IVPB        1 g 100 mL/hr over 30 Minutes Intravenous  Once 10/09/11 0715 10/09/11 0842           Assessment/Plan  1. S/p colectomy/colostomy 2. Intra-abd fluid collection, s/p perc drain  Plan: 1. Anticipate discharge to SNF tomorrow 2. Cont abx therapy 3. Cont JP drain   LOS: 14 days    Desiraye Rolfson  E 10/23/2011

## 2011-10-23 NOTE — Progress Notes (Signed)
Clinical Social Worker reviewed chart.  CSW confirmed plan with pt to dc to [facility].  CSW left a message with [name] at [facility] to confirm plan.  CSW to continue to follow and assist as needed.  [name], MSW, [name] [phone]

## 2011-10-23 NOTE — Progress Notes (Signed)
Patient seen and examined. She is doing well.  LLQ drainage greenish, thin, watery, not obviously infected.  Plan:   Check cultures, possibly discharge 1-2 days on p.o.  antibiotics   [name] M. Derrell Lolling, M.D., [facility] General and Minimally invasive Surgery Breast and Colorectal Surgery Office:   [phone] Pager:   [phone]

## 2011-10-23 NOTE — Progress Notes (Signed)
14 Days Post-Op  Subjective: Patient feeling ok; no new c/o at present  Objective: Vital signs in last 24 hours: Temp:  [97.9 F (36.6 C)-98.9 F (37.2 C)] 98 F (36.7 C) (01/29 0530) Pulse Rate:  [64-74] 64  (01/29 0530) Resp:  [18-28] 20  (01/29 0530) BP: (112-147)/(49-59) 147/51 mmHg (01/29 0530) SpO2:  [90 %-97 %] 90 % (01/29 0530) Weight:  [315 lb 14.7 oz (143.3 kg)] 315 lb 14.7 oz (143.3 kg) (01/29 0530) Last BM Date: 10/22/11  Intake/Output from previous day: 01/28 0701 - 01/29 0700 In: 839 [P.O.:360; I.V.:479] Out: 2725 [Urine:2000; Drains:75; Stool:650] Intake/Output this shift: Total I/O In: 360 [P.O.:360] Out: -   LLQ drain intact, insertion site ok, mildly tender, output about 10-15 cc's today  Lab Results:   Basename 10/20/11 1300  WBC 14.7*  HGB 9.0*  HCT 29.5*  PLT 355   BMET  Basename 10/20/11 1300  NA 140  K 3.9  CL 102  CO2 31  GLUCOSE 182*  BUN 9  CREATININE 0.72  CALCIUM 7.8*   PT/INR  Basename 10/22/11 1140  LABPROT 13.6  INR 1.02   ABG No results found for this basename: PHART:2,PCO2:2,PO2:2,HCO3:2 in the last 72 hours Results for orders placed during the hospital encounter of 10/09/11  URINE CULTURE     Status: Normal   Collection Time   10/09/11  4:48 AM      Component Value Range Status Comment   Specimen Description URINE, CATHETERIZED   Final    Special Requests NONE   Final    Culture  Setup Time [id number]   Final    Colony Count NO GROWTH   Final    Culture NO GROWTH   Final    Report Status 10/10/2011 FINAL   Final   MRSA PCR SCREENING     Status: Normal   Collection Time   10/09/11  1:39 PM      Component Value Range Status Comment   MRSA by PCR NEGATIVE  NEGATIVE  Final   CULTURE, BLOOD (ROUTINE X 2)     Status: Normal   Collection Time   10/09/11 10:12 PM      Component Value Range Status Comment   Specimen Description BLOOD LEFT HAND   Final    Special Requests BOTTLES DRAWN AEROBIC AND ANAEROBIC 10CC    Final    Culture  Setup Time [id number]   Final    Culture NO GROWTH 5 DAYS   Final    Report Status 10/16/2011 FINAL   Final   CULTURE, BLOOD (ROUTINE X 2)     Status: Normal   Collection Time   10/09/11 11:30 PM      Component Value Range Status Comment   Specimen Description BLOOD PICC LINE   Final    Special Requests BOTTLES DRAWN AEROBIC AND ANAEROBIC 10CC   Final    Culture  Setup Time [id number]   Final    Culture NO GROWTH 5 DAYS   Final    Report Status 10/16/2011 FINAL   Final   CULTURE, ROUTINE-ABSCESS     Status: Normal (Preliminary result)   Collection Time   10/22/11  3:24 PM      Component Value Range Status Comment   Specimen Description ABSCESS PERITONEAL CAVITY   Final    Special Requests NONE   Final    Gram Stain     Final    Value: MODERATE WBC PRESENT,BOTH PMN AND MONONUCLEAR     NO  SQUAMOUS EPITHELIAL CELLS SEEN     NO ORGANISMS SEEN   Culture NO GROWTH   Final    Report Status PENDING   Incomplete   ANAEROBIC CULTURE     Status: Normal (Preliminary result)   Collection Time   10/22/11  3:56 PM      Component Value Range Status Comment   Specimen Description ABSCESS PERITONEAL CAVITY   Final    Special Requests NONE   Final    Gram Stain     Final    Value: MODERATE WBC PRESENT,BOTH PMN AND MONONUCLEAR     NO SQUAMOUS EPITHELIAL CELLS SEEN     NO ORGANISMS SEEN   Culture PENDING   Incomplete    Report Status PENDING   Incomplete     Studies/Results: Ct Guided Abscess Drain  10/22/2011  *RADIOLOGY REPORT*  Clinical Data: Left lower quadrant fluid collection, concerning for abscess, status post colostomy, history of recent perforated sigmoid diverticulitis  CT FLUOROSCOPY GUIDED LEFT LOWER QUADRANT ABSCESS DRAIN PLACEMENT  Date:  10/22/2011 14:05:00  Radiologist:  Judie Petit. Ruel Favors, M.D.  Medications:  2 mg Versed, 100 mcg Fentanyl  Guidance:  CT fluoroscopy  Fluoroscopy time:  4 seconds  Sedation time:  25 minutes  Contrast volume:  None.  Complications:   No immediate  PROCEDURE/FINDINGS:  Informed consent was obtained from the patient following explanation of the procedure, risks, benefits and alternatives. The patient understands, agrees and consents for the procedure. All questions were addressed.  A time out was performed.  Maximal barrier sterile technique utilized including caps, mask, sterile gowns, sterile gloves, large sterile drape, hand hygiene, and betadine  Previous imaging reviewed demonstrating the left lower quadrant fluid collection.  The patient was positioned supine.  Noncontrast localization CT performed through the lower abdomen pelvis.  Left lower quadrant fluid collection identified.  Under sterile conditions and local anesthesia,  CT fluoroscopy was utilized to advance a 17 gauge 11.8 cm access needle into the fluid collection. There was return of exudative bilious fluid.  Sample sent for Gram stain and culture.  Guide wire advanced followed tract dilatation to insert a 10-French drain.  Drain catheter position confirmed in the collection.  Syringe aspiration yielded 30 ml fluid.  The catheter was secured with a Prolene suture and connected to external suction bulb.  Sterile dressing applied.  No immediate complication.  The patient tolerated the procedure well.  IMPRESSION: Successful CT fluoroscopy guided left lower quadrant abscess drain placement with insertion of a 10-French drain.  30 ml of bilious exudative fluid removed.  Gram stain cultures sent.  Original Report Authenticated By: Judie Petit. Ruel Favors, M.D.   Ct Abdomen Pelvis W Contrast  10/21/2011  *RADIOLOGY REPORT*  Clinical Data: Leukocytosis.  Evaluate for abscess  CT ABDOMEN AND PELVIS WITH CONTRAST  Technique:  Multidetector CT imaging of the abdomen and pelvis was performed following the standard protocol during bolus administration of intravenous contrast.  Contrast: OMNIPAQUE IOHEXOL 300 MG/ML IV SOLN  Comparison: 10/10/1928  Findings: Small bilateral pleural effusions  are noted right greater than left.  Overlying atelectasis/consolidation noted.  There is no focal splenic abnormality.  The adrenal glands are both normal.  The liver appears normal.  Stone is noted within the lumen of the gallbladder measuring 0.8 mm.  No gallbladder wall thickening or pericholecystic fluid.  There are scattered pancreatic parenchymal calcifications which may be the sequela of chronic pancreatitis.  No biliary dilatation is identified.  Normal appearance of the kidneys.  No upper abdominal adenopathy.  There is no pelvic or inguinal adenopathy.  The urinary bladder appears normal.  There is an open ventral abdominal wall wound.  A sigmoidectomy has been performed with the left lower quadrant colostomy.   Ingested enteric contrast material is identified within normal caliber small bowel loops and in the proximal colon.    Within the left lower quadrant of the abdomen there is a focal fluid collection which measures 5.4 x 6.6 X 4.1 cm.  Mild lateral body wall edema is noted bilaterally.  Review of the visualized osseous structures is significant for mild lumbar spondylosis.  IMPRESSION:  1.  Left lower quadrant focal fluid collection is identified.  This may represent early abscess formation. 2.  Status post sigmoidectomy with left lower quadrant colostomy.  3.  Open ventral abdominal wall wound. 4.  Gallstones.  Original Report Authenticated By: Rosealee Albee, M.D.    Anti-infectives: Anti-infectives     Start     Dose/Rate Route Frequency Ordered Stop   10/10/11 0700   ertapenem (INVANZ) 1 g in sodium chloride 0.9 % 50 mL IVPB  Status:  Discontinued        1 g 100 mL/hr over 30 Minutes Intravenous Every 24 hours 10/09/11 1336 10/09/11 2022   10/09/11 2200   ertapenem (INVANZ) 1 g in sodium chloride 0.9 % 50 mL IVPB        1 g 100 mL/hr over 30 Minutes Intravenous Every 24 hours 10/09/11 2022     10/09/11 0730   ertapenem (INVANZ) 1 g in sodium chloride 0.9 % 50 mL IVPB        1  g 100 mL/hr over 30 Minutes Intravenous  Once 10/09/11 0715 10/09/11 0842          Assessment/Plan: S/p LLQ fluid collection drainage 1/28; check cx's, monitor output, check f/u CT within 1 week of placement   LOS: 14 days    Aurther Harlin,D [facility] 10/23/2011

## 2011-10-23 NOTE — Consult Note (Signed)
WOC ostomy follow up  Stoma type/location: LLQ, end colostomy  Stomal assessment/size: pink and moist with good output Peristomal assessment: intact without problems Treatment options for stomal/peristomal skin: using 2" barrier around stoma for gentle convexity due to stoma flush with skin Output liquid brown fecal material in pouch and flatus Ostomy pouching: 1p flat with 2" barrier ring  Education provided: re educated to try to empty the pouch when she is up to [facility], she reports she can not see to do this.  I will instruct staff to encourage her to try the next time she is up.   Thanks  Nazanin Kinner [employer], CWOCN [phone])

## 2011-10-24 LAB — GLUCOSE, CAPILLARY: Glucose-Capillary: 123 mg/dL — ABNORMAL HIGH (ref 70–99)

## 2011-10-24 MED ORDER — OXYCODONE-ACETAMINOPHEN 5-325 MG PO TABS: 1.0000 | ORAL_TABLET | ORAL | Status: AC | PRN

## 2011-10-24 MED ORDER — CIPROFLOXACIN HCL 500 MG PO TABS: 500.0000 mg | ORAL_TABLET | Freq: Two times a day (BID) | ORAL | Status: AC

## 2011-10-24 MED ORDER — METRONIDAZOLE 500 MG PO TABS: 500.0000 mg | ORAL_TABLET | Freq: Three times a day (TID) | ORAL | Status: AC

## 2011-10-24 NOTE — Progress Notes (Signed)
Patient ID: [name], female   DOB: 1940-12-22, 71 y.o.   MRN: [id number] 15 Days Post-Op  Subjective: Pt feels well.  No complaints.  Tolerating a regular diet.    Objective: Vital signs in last 24 hours: Temp:  [98.2 F (36.8 C)-98.8 F (37.1 C)] 98.2 F (36.8 C) (01/30 6295) Pulse Rate:  [67-75] 67  (01/30 0613) Resp:  [18] 18  (01/30 0613) BP: (108-124)/(41-56) 122/56 mmHg (01/30 0613) SpO2:  [93 %-95 %] 93 % (01/30 0613) Last BM Date: 10/23/11  Intake/Output from previous day: 01/29 0701 - 01/30 0700 In: 1789.2 [P.O.:840; I.V.:639.2; IV Piggyback:300] Out: 3030 [Urine:2500; Drains:130; Stool:400] Intake/Output this shift: Total I/O In: 360 [P.O.:360] Out: -   PE: Abd: soft, appropriately tender, +BS, ND, ostomy with stool present.  JP with serosang output.  Lab Results:  No results found for this basename: WBC:2,HGB:2,HCT:2,PLT:2 in the last 72 hours BMET No results found for this basename: NA:2,K:2,CL:2,CO2:2,GLUCOSE:2,BUN:2,CREATININE:2,CALCIUM:2 in the last 72 hours PT/INR  Basename 10/22/11 1140  LABPROT 13.6  INR 1.02     Studies/Results: Ct Guided Abscess Drain  10/22/2011  *RADIOLOGY REPORT*  Clinical Data: Left lower quadrant fluid collection, concerning for abscess, status post colostomy, history of recent perforated sigmoid diverticulitis  CT FLUOROSCOPY GUIDED LEFT LOWER QUADRANT ABSCESS DRAIN PLACEMENT  Date:  10/22/2011 14:05:00  Radiologist:  Judie Petit. Ruel Favors, M.D.  Medications:  2 mg Versed, 100 mcg Fentanyl  Guidance:  CT fluoroscopy  Fluoroscopy time:  4 seconds  Sedation time:  25 minutes  Contrast volume:  None.  Complications:  No immediate  PROCEDURE/FINDINGS:  Informed consent was obtained from the patient following explanation of the procedure, risks, benefits and alternatives. The patient understands, agrees and consents for the procedure. All questions were addressed.  A time out was performed.  Maximal barrier sterile technique utilized  including caps, mask, sterile gowns, sterile gloves, large sterile drape, hand hygiene, and betadine  Previous imaging reviewed demonstrating the left lower quadrant fluid collection.  The patient was positioned supine.  Noncontrast localization CT performed through the lower abdomen pelvis.  Left lower quadrant fluid collection identified.  Under sterile conditions and local anesthesia,  CT fluoroscopy was utilized to advance a 17 gauge 11.8 cm access needle into the fluid collection. There was return of exudative bilious fluid.  Sample sent for Gram stain and culture.  Guide wire advanced followed tract dilatation to insert a 10-French drain.  Drain catheter position confirmed in the collection.  Syringe aspiration yielded 30 ml fluid.  The catheter was secured with a Prolene suture and connected to external suction bulb.  Sterile dressing applied.  No immediate complication.  The patient tolerated the procedure well.  IMPRESSION: Successful CT fluoroscopy guided left lower quadrant abscess drain placement with insertion of a 10-French drain.  30 ml of bilious exudative fluid removed.  Gram stain cultures sent.  Original Report Authenticated By: Judie Petit. Ruel Favors, M.D.    Anti-infectives: Anti-infectives     Start     Dose/Rate Route Frequency Ordered Stop   10/10/11 0700   ertapenem (INVANZ) 1 g in sodium chloride 0.9 % 50 mL IVPB  Status:  Discontinued        1 g 100 mL/hr over 30 Minutes Intravenous Every 24 hours 10/09/11 1336 10/09/11 2022   10/09/11 2200   ertapenem (INVANZ) 1 g in sodium chloride 0.9 % 50 mL IVPB        1 g 100 mL/hr over 30 Minutes Intravenous Every 24  hours 10/09/11 2022     10/09/11 0730   ertapenem (INVANZ) 1 g in sodium chloride 0.9 % 50 mL IVPB        1 g 100 mL/hr over 30 Minutes Intravenous  Once 10/09/11 0715 10/09/11 0842           Assessment/Plan  1. S/p Hartmann's 2. Intra-abd abscess, s/p perc drain  Plan: 1. Ok for d/c to SNF today 2. JP to remain  in place, will follow up Dr. Andrey Campanile in about a week for f/u.   LOS: 15 days    Allan Bacigalupi E 10/24/2011

## 2011-10-24 NOTE — Progress Notes (Signed)
Pt discussed in LOS meeting this am. 

## 2011-10-24 NOTE — Discharge Summary (Signed)
Patient ID: BRALEIGH MASSOUD MRN: [id number] DOB/AGE: 10/24/1940 71 y.o.  Admit date: 10/09/2011 Discharge date: 10/24/2011  Procedures:  Exploratory Laparotomy with sigmoid colectomy and end colostomy S/p percutaneous drain placement of intra-abdominal fluid collection  Consults: pulmonary/intensive care, Dr. Kendrick Fries Interventional Radiology  Reason for Admission:  This is a 71 yo female who has had progressive abdominal pain for one week prior to admission.  She developed nausea and vomiting.  Due to worsening pain, she went to [facility] where she was found to have free air on an x-ray.  Her CT scan then showed free air with sigmoid diverticulitis and a 6cm fluid collection.  She requested transfer to [facility] and we accepted her.  Admission Diagnoses: 1. Perforated diverticulitis with pneumoperitoneum 2.HTN 3. DM  Hospital Course:  The patient was admitted to the hospital and started on IV Invanz.  She was emergently taken to the operating room where she underwent an exploratory laparotomy with sigmoid colectomy and colostomy.  The patient was transferred to the ICU postoperatively on the ventilator.  CCM assisted [place] in further management of her sepsis and respiratory failure status.  She was ultimately able to be extubated on POD# 2.  On POD#3, her NGT and foley were discontinued and she was stable to be transferred to a floor.  Her diet was then advanced as tolerated over the next several days.  She had PT/OT evaluations who felt that she would require SNF placement at time of discharge.  Placement search was initiated, which took several days.  On POD# 12 she was noted to have an elevation in her WBC despite still being on IV Invanz.  She had a repeat CT scan that revealed a post operative fluid collection.  IR was then consulted and this was able to be percutaneously drain.  Her cultures revealed no organisms, however these were still preliminary at the time of discharge.  She was ultimately able to be  discharged on POD# 15 in stable condition.  Her staples had been discontinued and her ostomy was functioning appropriately.  Discharge Diagnoses:  Principal Problem:  *Diverticulitis of colon with perforation sigmoid colectomy and colostomy 1/15 Active Problems:  Hypertension  Diabetes mellitus  Obesity, Class III, BMI 40-49.9 (morbid obesity)   Discharge Medications: Medication List  As of 10/24/2011 10:37 AM   TAKE these medications         acetaminophen 325 MG tablet   Commonly known as: TYLENOL   Take 325-650 mg by mouth every 6 (six) hours as needed. Pain      aspirin EC 325 MG tablet   Take 325 mg by mouth daily.      CALCIUM-VITAMIN D PO   Take 2 tablets by mouth daily.      ciprofloxacin 500 MG tablet   Commonly known as: CIPRO   Take 1 tablet (500 mg total) by mouth 2 (two) times daily.      estradiol 0.05 MG/24HR   Commonly known as: VIVELLE-DOT   Place 1 patch onto the skin 2 (two) times a week.      GLUCOSAMINE 1500 COMPLEX PO   Take 2 tablets by mouth daily.      losartan 100 MG tablet   Commonly known as: COZAAR   Take 100 mg by mouth daily.      meloxicam 15 MG tablet   Commonly known as: MOBIC   Take 15 mg by mouth daily.      metFORMIN 500 MG tablet   Commonly known as:  GLUCOPHAGE   Take 500 mg by mouth 2 (two) times daily with a meal.      metroNIDAZOLE 500 MG tablet   Commonly known as: FLAGYL   Take 1 tablet (500 mg total) by mouth 3 (three) times daily.      montelukast 10 MG tablet   Commonly known as: SINGULAIR   Take 10 mg by mouth at bedtime.      OVER THE COUNTER MEDICATION   Take 2 capsules by mouth daily. Cinnamon plus chromium      OVER THE COUNTER MEDICATION   Take 2 tablets by mouth daily. Chondroitian 1200mg       oxyCODONE-acetaminophen 5-325 MG per tablet   Commonly known as: PERCOCET   Take 1-2 tablets by mouth every 4 (four) hours as needed.      sitaGLIPtin 50 MG tablet   Commonly known as: JANUVIA   Take 50 mg by  mouth daily.      VICKS SINEX ULTRA FINE MIST 0.5 % nasal solution   Generic drug: phenylephrine   Place 1 drop into the nose every 4 (four) hours as needed. Nasal Congestion.            Discharge Instructions: Follow-up Information    Follow up with Atilano Ina, MD on 11/05/2011. (be there at 10:30am for an 11:00am appointment)    Contact information:   [facility] [address] [place] [address] [phone]          Signed: Letha Cape 10/24/2011, 10:37 AM

## 2011-10-24 NOTE — Progress Notes (Signed)
Clinical Social Worker submitted appropriate paperwork to [employer].  CSW phoned transport.  CSW to sign off at dc.  [name], MSW, [place] [phone]

## 2011-10-24 NOTE — Progress Notes (Signed)
15 Days Post-Op  Subjective: Patient doing well; no new c/o ; awaiting d/c today  Objective: Vital signs in last 24 hours: Temp:  [98.2 F (36.8 C)-98.8 F (37.1 C)] 98.2 F (36.8 C) (01/30 1478) Pulse Rate:  [67-75] 67  (01/30 0613) Resp:  [18] 18  (01/30 0613) BP: (108-124)/(41-56) 122/56 mmHg (01/30 0613) SpO2:  [93 %-95 %] 93 % (01/30 0613) Last BM Date: 10/23/11  Intake/Output from previous day: 01/29 0701 - 01/30 0700 In: 1789.2 [P.O.:840; I.V.:639.2; IV Piggyback:300] Out: 3030 [Urine:2500; Drains:130; Stool:400] Intake/Output this shift: Total I/O In: 360 [P.O.:360] Out: -   LLQ drain intact, insertion site mildly tender, output 30 cc's last 24 hrs, fluid cx's neg to date  Lab Results:  No results found for this basename: WBC:2,HGB:2,HCT:2,PLT:2 in the last 72 hours BMET No results found for this basename: NA:2,K:2,CL:2,CO2:2,GLUCOSE:2,BUN:2,CREATININE:2,CALCIUM:2 in the last 72 hours PT/INR  Basename 10/22/11 1140  LABPROT 13.6  INR 1.02   ABG No results found for this basename: PHART:2,PCO2:2,PO2:2,HCO3:2 in the last 72 hours Results for orders placed during the hospital encounter of 10/09/11  URINE CULTURE     Status: Normal   Collection Time   10/09/11  4:48 AM      Component Value Range Status Comment   Specimen Description URINE, CATHETERIZED   Final    Special Requests NONE   Final    Culture  Setup Time [id number]   Final    Colony Count NO GROWTH   Final    Culture NO GROWTH   Final    Report Status 10/10/2011 FINAL   Final   MRSA PCR SCREENING     Status: Normal   Collection Time   10/09/11  1:39 PM      Component Value Range Status Comment   MRSA by PCR NEGATIVE  NEGATIVE  Final   CULTURE, BLOOD (ROUTINE X 2)     Status: Normal   Collection Time   10/09/11 10:12 PM      Component Value Range Status Comment   Specimen Description BLOOD LEFT HAND   Final    Special Requests BOTTLES DRAWN AEROBIC AND ANAEROBIC 10CC   Final    Culture  Setup  Time [id number]   Final    Culture NO GROWTH 5 DAYS   Final    Report Status 10/16/2011 FINAL   Final   CULTURE, BLOOD (ROUTINE X 2)     Status: Normal   Collection Time   10/09/11 11:30 PM      Component Value Range Status Comment   Specimen Description BLOOD PICC LINE   Final    Special Requests BOTTLES DRAWN AEROBIC AND ANAEROBIC 10CC   Final    Culture  Setup Time [id number]   Final    Culture NO GROWTH 5 DAYS   Final    Report Status 10/16/2011 FINAL   Final   CULTURE, ROUTINE-ABSCESS     Status: Normal (Preliminary result)   Collection Time   10/22/11  3:24 PM      Component Value Range Status Comment   Specimen Description ABSCESS PERITONEAL CAVITY   Final    Special Requests NONE   Final    Gram Stain     Final    Value: MODERATE WBC PRESENT,BOTH PMN AND MONONUCLEAR     NO SQUAMOUS EPITHELIAL CELLS SEEN     NO ORGANISMS SEEN   Culture NO GROWTH 1 DAY   Final    Report Status PENDING   Incomplete  ANAEROBIC CULTURE     Status: Normal (Preliminary result)   Collection Time   10/22/11  3:56 PM      Component Value Range Status Comment   Specimen Description ABSCESS PERITONEAL CAVITY   Final    Special Requests NONE   Final    Gram Stain     Final    Value: MODERATE WBC PRESENT,BOTH PMN AND MONONUCLEAR     NO SQUAMOUS EPITHELIAL CELLS SEEN     NO ORGANISMS SEEN   Culture     Final    Value: NO ANAEROBES ISOLATED; CULTURE IN PROGRESS FOR 5 DAYS   Report Status PENDING   Incomplete     Studies/Results: Ct Guided Abscess Drain  10/22/2011  *RADIOLOGY REPORT*  Clinical Data: Left lower quadrant fluid collection, concerning for abscess, status post colostomy, history of recent perforated sigmoid diverticulitis  CT FLUOROSCOPY GUIDED LEFT LOWER QUADRANT ABSCESS DRAIN PLACEMENT  Date:  10/22/2011 14:05:00  Radiologist:  Judie Petit. Ruel Favors, M.D.  Medications:  2 mg Versed, 100 mcg Fentanyl  Guidance:  CT fluoroscopy  Fluoroscopy time:  4 seconds  Sedation time:  25 minutes   Contrast volume:  None.  Complications:  No immediate  PROCEDURE/FINDINGS:  Informed consent was obtained from the patient following explanation of the procedure, risks, benefits and alternatives. The patient understands, agrees and consents for the procedure. All questions were addressed.  A time out was performed.  Maximal barrier sterile technique utilized including caps, mask, sterile gowns, sterile gloves, large sterile drape, hand hygiene, and betadine  Previous imaging reviewed demonstrating the left lower quadrant fluid collection.  The patient was positioned supine.  Noncontrast localization CT performed through the lower abdomen pelvis.  Left lower quadrant fluid collection identified.  Under sterile conditions and local anesthesia,  CT fluoroscopy was utilized to advance a 17 gauge 11.8 cm access needle into the fluid collection. There was return of exudative bilious fluid.  Sample sent for Gram stain and culture.  Guide wire advanced followed tract dilatation to insert a 10-French drain.  Drain catheter position confirmed in the collection.  Syringe aspiration yielded 30 ml fluid.  The catheter was secured with a Prolene suture and connected to external suction bulb.  Sterile dressing applied.  No immediate complication.  The patient tolerated the procedure well.  IMPRESSION: Successful CT fluoroscopy guided left lower quadrant abscess drain placement with insertion of a 10-French drain.  30 ml of bilious exudative fluid removed.  Gram stain cultures sent.  Original Report Authenticated By: Judie Petit. Ruel Favors, M.D.    Anti-infectives: Anti-infectives     Start     Dose/Rate Route Frequency Ordered Stop   10/24/11 0000   ciprofloxacin (CIPRO) 500 MG tablet        500 mg Oral 2 times daily 10/24/11 1026 11/03/11 2359   10/24/11 0000   metroNIDAZOLE (FLAGYL) 500 MG tablet        500 mg Oral 3 times daily 10/24/11 1026 11/03/11 2359   10/10/11 0700   ertapenem (INVANZ) 1 g in sodium chloride 0.9 %  50 mL IVPB  Status:  Discontinued        1 g 100 mL/hr over 30 Minutes Intravenous Every 24 hours 10/09/11 1336 10/09/11 2022   10/09/11 2200   ertapenem (INVANZ) 1 g in sodium chloride 0.9 % 50 mL IVPB        1 g 100 mL/hr over 30 Minutes Intravenous Every 24 hours 10/09/11 2022     10/09/11 0730  ertapenem (INVANZ) 1 g in sodium chloride 0.9 % 50 mL IVPB        1 g 100 mL/hr over 30 Minutes Intravenous  Once 10/09/11 0715 10/09/11 0842          Assessment/Plan: s/p LLQ fluid collection drainage 1/28 ;for d/c today with drain; plans as outlined by CCS    LOS: 15 days    ALLRED,D [facility] 10/24/2011

## 2011-10-24 NOTE — Discharge Summary (Signed)
I agree with the discharge summary, discharge medications, and discharge plans and followup as outlined by Ms. Osborne in the discharge summary.  [name]. Derrell Lolling, M.D., [facility] General and Minimally invasive Surgery Breast and Colorectal Surgery Office:   [phone] Pager:   [phone]

## 2011-10-24 NOTE — Progress Notes (Signed)
Patient discussed at the Long Length of Stay Haim Hansson Weeks 10/24/2011  

## 2011-10-24 NOTE — Progress Notes (Signed)
Physical Therapy Treatment Patient Details Name: [name] MRN: [id number] DOB: May 02, 1941 Today's Date: 10/24/2011  PT Assessment/Plan  PT - Assessment/Plan Comments on Treatment Session: Pt making really great progress today with ambulation. Pt is schedule to DC to SNF today per patient report PT Plan: Discharge plan remains appropriate PT Frequency: Min 3X/week Follow Up Recommendations: Skilled nursing facility Equipment Recommended: Defer to next venue PT Goals  Acute Rehab PT Goals PT Goal: Supine/Side to Sit - Progress: Met PT Goal: Sit to Stand - Progress: Progressing toward goal PT Goal: Ambulate - Progress: Met  PT Treatment Precautions/Restrictions  Precautions Precautions: Fall Restrictions Weight Bearing Restrictions: No Other Position/Activity Restrictions: pt with Abdominal Wound Vac and Ostomy Mobility (including Balance) Bed Mobility Rolling Right: 6: Modified independent (Device/Increase time);With rail Right Sidelying to Sit: 6: Modified independent (Device/Increase time);With rails Transfers Sit to Stand: Other (comment);4: Min assist;From bed;With upper extremity assist (MinGuard A) Sit to Stand Details (indicate cue type and reason): No cues required Stand to Sit: 4: Min assist;Other (comment);With armrests;With upper extremity assist;To chair/3-in-1 (MinGuard A) Stand to Sit Details: No cues required Ambulation/Gait Ambulation/Gait Assistance: Other (comment);4: Min assist (MinGuard A) Ambulation/Gait Assistance Details (indicate cue type and reason): Cues for safe positioning within RW Ambulation Distance (Feet): 100 Feet Assistive device: Rolling walker Gait Pattern: Step-through pattern;Trunk flexed    Exercise    End of Session PT - End of Session Activity Tolerance: Patient tolerated treatment well Patient left: in chair Nurse Communication: Mobility status for transfers;Mobility status for ambulation General Behavior During Session: [facility]  for tasks performed Cognition: [facility] for tasks performed  Fredrich Birks 10/24/2011, 11:41 AM 10/24/2011 Fredrich Birks PTA [phone] pager [phone] office

## 2011-10-24 NOTE — Consult Note (Signed)
WOC follow up Pt seen for ostomy and wound follow up. She will be dc to SNF today.  Orders for pt to dc with moist gauze dressing and pt can be hooked up to NPWT at facility.  Bedside nurse will change dressing when pt ready to be transported.  Ostomy pouch changed per bedside nursing this am and is intact without problems.  I have made sure that she has 1 extra pouch to travel with to SNF, instructed pt/family to make sure this goes with her to facility.    Thanks Delphia Kaylor [employer], [place] [phone]

## 2011-10-24 NOTE — Progress Notes (Signed)
Patient is seen and examined. I agree with Ms. Osborne's note.  Cultures are negative thus far, and the fluid drainage does appear to be noninfected.  She will be discharged today to skilled nursing facility. She will follow up with Dr. Gaynelle Adu in the office and he will manage the drain.   [name]. Derrell Lolling, M.D., [facility] General and Minimally invasive Surgery Breast and Colorectal Surgery Office:   [phone] Pager:   [phone]

## 2011-10-26 LAB — CULTURE, ROUTINE-ABSCESS: Culture: NO GROWTH

## 2011-10-27 ENCOUNTER — Emergency Department (HOSPITAL_COMMUNITY)
Admission: EM | Admit: 2011-10-27 | Discharge: 2011-10-27 | Disposition: A | Discharge status: Skilled Nursing Facility | Payer: Medicare Other | Attending: Emergency Medicine | Admitting: Emergency Medicine

## 2011-10-27 ENCOUNTER — Encounter (HOSPITAL_COMMUNITY): Payer: Self-pay

## 2011-10-27 ENCOUNTER — Emergency Department (HOSPITAL_COMMUNITY): Payer: Medicare Other

## 2011-10-27 DIAGNOSIS — Z7982 Long term (current) use of aspirin: Secondary | ICD-10-CM | POA: Insufficient documentation

## 2011-10-27 DIAGNOSIS — Z6841 Body Mass Index (BMI) 40.0 and over, adult: Secondary | ICD-10-CM | POA: Insufficient documentation

## 2011-10-27 DIAGNOSIS — I10 Essential (primary) hypertension: Secondary | ICD-10-CM | POA: Insufficient documentation

## 2011-10-27 DIAGNOSIS — Z933 Colostomy status: Secondary | ICD-10-CM | POA: Insufficient documentation

## 2011-10-27 DIAGNOSIS — M129 Arthropathy, unspecified: Secondary | ICD-10-CM | POA: Insufficient documentation

## 2011-10-27 DIAGNOSIS — R109 Unspecified abdominal pain: Secondary | ICD-10-CM

## 2011-10-27 DIAGNOSIS — F17200 Nicotine dependence, unspecified, uncomplicated: Secondary | ICD-10-CM | POA: Insufficient documentation

## 2011-10-27 DIAGNOSIS — E119 Type 2 diabetes mellitus without complications: Secondary | ICD-10-CM | POA: Insufficient documentation

## 2011-10-27 LAB — BASIC METABOLIC PANEL
BUN: 13 mg/dL (ref 6–23)
Creatinine, Ser: 0.67 mg/dL (ref 0.50–1.10)
GFR calc Af Amer: >90 mL/min (ref >90)
GFR calc non Af Amer: 86 mL/min — ABNORMAL LOW (ref >90)

## 2011-10-27 LAB — DIFFERENTIAL
Basophils Absolute: 0.1 10*3/uL (ref 0.0–0.1)
Lymphocytes Relative: 25 % (ref 12–46)
Lymphs Abs: 3.4 10*3/uL (ref 0.7–4.0)
Monocytes Absolute: 1 10*3/uL (ref 0.1–1.0)
Monocytes Relative: 7 % (ref 3–12)
Neutro Abs: 9 10*3/uL — ABNORMAL HIGH (ref 1.7–7.7)

## 2011-10-27 LAB — ANAEROBIC CULTURE

## 2011-10-27 LAB — CBC
MCHC: 31 g/dL (ref 30.0–36.0)
Platelets: 659 10*3/uL — ABNORMAL HIGH (ref 150–400)
RDW: 18.9 % — ABNORMAL HIGH (ref 11.5–15.5)
WBC: 14 10*3/uL — ABNORMAL HIGH (ref 4.0–10.5)

## 2011-10-27 MED ORDER — OXYCODONE-ACETAMINOPHEN 5-325 MG PO TABS
1.0000 | ORAL_TABLET | Freq: Once | ORAL | Status: AC
  Administered 2011-10-27: 1 via ORAL
  Filled 2011-10-27: qty 1

## 2011-10-27 MED ORDER — IOHEXOL 300 MG/ML  SOLN
40.0000 mL | Freq: Once | INTRAMUSCULAR | Status: AC | PRN
  Administered 2011-10-27: 40 mL via ORAL

## 2011-10-27 NOTE — ED Notes (Signed)
Awaiting EMS to transport patient back to [facility].  Report called to Nursing Supervisor at [facility]. No change in treatment plan.

## 2011-10-27 NOTE — ED Notes (Signed)
Colostomy bag emptied semi solid stool.

## 2011-10-27 NOTE — ED Provider Notes (Signed)
1800 Patient is s/p colostomy and abscess drain for perforated diverticula. JP drain to LLQ with decreased drainage x 2 days and some leaking around the skin site. Tubing milked with little drainage.  Exam: Morbidly obese woman. LLQ area without swelling, drainage at the site. Drain intact. Incision to abdomen healing by secondary intention. Spoke with Dr. Eppie Gibson, radiologist. He advised that CT abdomen with oral contrast only will be adequate to evaluate the LLQ site.  Ct scan shows almost complete resolution of fluid in the LLQ explaining the scant amount of drainage now present in the JP. Patient was given a copy of the written report. She will be returned to [facility] to continue her convalescence.  Medical screening examination/treatment/procedure(s) were conducted as a shared visit with non-physician practitioner(s) and myself.  I personally evaluated the patient during the encounter  Nicoletta Dress. Colon Branch, MD 10/27/11 2226

## 2011-10-27 NOTE — ED Notes (Signed)
Discharge paperwork and results of CT scan and labwork sent back with patient to nursing supervisor at [facility]. Transported by [facility].  [employer], Consulting civil engineer for [employer], JP drain all intact

## 2011-10-27 NOTE — ED Provider Notes (Signed)
History     CSN: [id number]  Arrival date & time 10/27/11  1604   First MD Initiated Contact with Patient 10/27/11 1616      Chief Complaint  Patient presents with  . Drainage from Incision    (Consider location/radiation/quality/duration/timing/severity/associated sxs/prior treatment) HPI Comments: Patient presents for evaluation of her Jackson-Pratt drain, which has stopped draining over the past one to 2 days. She has had abdominal surgery on January 15 for problems associated with a ruptured diverticulitis, and currently has not only the Jackson-Pratt, but also a colostomy and an open wound with wound VAC in her mid lower abdomen. She transferred from [facility] to a local assisted-living facility 3 days ago. Prior to the transfer her drain was having to be emptied daily and she states that the bulb was approximately half full each time it was emptied. It has not drained in 2-3 days, but she has noted drainage of fluid from around the base of the drain site. She also has slight increased discomfort in her left lower abdomen. She denies fevers or chills, no nausea or vomiting. She has maintained a good appetite. Past medical history significant for diabetes. She is currently on Cipro and Flagyl.  The history is provided by the patient and the spouse.    Past Medical History  Diagnosis Date  . Hypertension   . Diabetes mellitus   . Arthritis     Past Surgical History  Procedure Date  . Abdominal hysterectomy   . Appendectomy   . Back surgery   . Colon resection 10/09/2011    Procedure: COLON RESECTION;  Surgeon: Atilano Ina, MD;  Location: [facility] OR;  Service: General;  Laterality: N/A;  Colon Resection with colostomy  . Colostomy   . Jackson pratt   . Application of wound vac wound vac    History reviewed. No pertinent family history.  History  Substance Use Topics  . Smoking status: Current Some Day Smoker  . Smokeless tobacco: Not on file  . Alcohol Use: No    OB  History    Grav Para Term Preterm Abortions TAB SAB Ect Mult Living                  Review of Systems  Constitutional: Negative for fever.  HENT: Negative for congestion, sore throat and neck pain.   Eyes: Negative.   Respiratory: Negative for chest tightness and shortness of breath.   Cardiovascular: Negative for chest pain.  Gastrointestinal: Positive for abdominal pain. Negative for nausea, vomiting, constipation, blood in stool and abdominal distention.  Genitourinary: Negative.   Musculoskeletal: Negative for joint swelling and arthralgias.  Skin: Negative.  Negative for rash and wound.  Neurological: Negative for dizziness, weakness, light-headedness, numbness and headaches.  Hematological: Negative.   Psychiatric/Behavioral: Negative.     Allergies  Codeine  Home Medications   Current Outpatient Rx  Name Route Sig Dispense Refill  . ACETAMINOPHEN 325 MG PO TABS Oral Take 325-650 mg by mouth every 6 (six) hours as needed. Pain    . ASPIRIN EC 325 MG PO TBEC Oral Take 325 mg by mouth daily.    Marland Kitchen CALCIUM-VITAMIN D PO Oral Take 2 tablets by mouth daily.    Marland Kitchen CIPROFLOXACIN HCL 500 MG PO TABS Oral Take 1 tablet (500 mg total) by mouth 2 (two) times daily. 14 tablet 0  . ESTRADIOL 0.05 MG/24HR TD PTTW Transdermal Place 1 patch onto the skin 2 (two) times a week.    Marland Kitchen  LOSARTAN POTASSIUM 100 MG PO TABS Oral Take 100 mg by mouth daily.    . MELOXICAM 15 MG PO TABS Oral Take 15 mg by mouth daily.    Marland Kitchen METFORMIN HCL 500 MG PO TABS Oral Take 500 mg by mouth 2 (two) times daily with a meal.    . METRONIDAZOLE 500 MG PO TABS Oral Take 1 tablet (500 mg total) by mouth 3 (three) times daily. 21 tablet 0  . MONTELUKAST SODIUM 10 MG PO TABS Oral Take 10 mg by mouth at bedtime.    . OXYCODONE-ACETAMINOPHEN 5-325 MG PO TABS Oral Take 1-2 tablets by mouth every 4 (four) hours as needed. 40 tablet 0  . SITAGLIPTIN PHOSPHATE 50 MG PO TABS Oral Take 50 mg by mouth daily.    Marland Kitchen GLUCOSAMINE 1500  COMPLEX PO Oral Take 2 tablets by mouth daily.    Marland Kitchen PHENYLEPHRINE HCL 0.5 % NA SOLN Nasal Place 1 drop into the nose every 4 (four) hours as needed. Nasal Congestion.      BP 102/75  Pulse 77  Temp(Src) 97.8 F (36.6 C) (Oral)  Resp 20  Ht 5\' 6"  (1.676 m)  Wt 285 lb (129.275 kg)  BMI 46.00 kg/m2  SpO2 94%  Physical Exam  Nursing note and vitals reviewed. Constitutional: She is oriented to person, place, and time. She appears well-developed and well-nourished.  HENT:  Head: Normocephalic and atraumatic.  Eyes: Conjunctivae are normal.  Neck: Normal range of motion.  Cardiovascular: Normal rate, regular rhythm, normal heart sounds and intact distal pulses.   Pulmonary/Chest: Effort normal and breath sounds normal. She has no wheezes.  Abdominal: Soft. Bowel sounds are normal. There is no tenderness.       Obese abdomen. Modest tenderness beneath pannusof left lower quadrant without guarding or rebound, no induration or fluctuance appreciated. No visible drainage from around the Jackson-Pratt base at the site. The tube does contain serious serous fluid with no fluid in the bulb itself.  Musculoskeletal: Normal range of motion.  Neurological: She is alert and oriented to person, place, and time.  Skin: Skin is warm and dry.  Psychiatric: She has a normal mood and affect.    ED Course  Procedures (including critical care time)  Labs Reviewed  BASIC METABOLIC PANEL - Abnormal; Notable for the following:    Glucose, Bld 123 (*)    GFR calc non Af Amer 86 (*)    All other components within normal limits  CBC - Abnormal; Notable for the following:    WBC 14.0 (*)    Hemoglobin 10.5 (*)    HCT 33.9 (*)    MCH 24.2 (*)    RDW 18.9 (*)    Platelets 659 (*)    All other components within normal limits  DIFFERENTIAL - Abnormal; Notable for the following:    Neutro Abs 9.0 (*)    All other components within normal limits   Ct Abdomen Pelvis Wo Contrast  10/27/2011  *RADIOLOGY  REPORT*  Clinical Data: Incision drainage, recent surgery for diverticular disease.  CT ABDOMEN AND PELVIS WITHOUT CONTRAST  Technique:  Multidetector CT imaging of the abdomen and pelvis was performed following the standard protocol without intravenous contrast.  Comparison: 10/22/2011  Findings: Linear lung base opacities.  Heart size within normal limits.  No pleural or pericardial effusion.  Intra-abdominal organ evaluation is limited without intravenous contrast.  Lobular hepatic contour suggests early cirrhotic changes.  Cholelithiasis.  Unremarkable spleen.  Punctate calcifications scattered throughout the pancreas  in keeping with sequelae of chronic pancreatitis.  Unremarkable adrenal glands.  Symmetric renal size.  Mild right perinephric fat stranding. Subcentimeter hyperdense focus within the left kidney is incompletely characterized.  Attention on follow-up.  No hydronephrosis or hydroureter.  No bowel obstruction. Partial colectomy.  Colonic diverticulosis. Left lower quadrant colostomy.  Left lower quadrant pigtail catheter, with reduction and near resolution of the previously noted fluid pocket.  Evidence of infraumbilical midline laparotomy with open anterior abdominal wound.  There is stranding of the subcutaneous fat and foci of air just superficial to the peritoneum (image 73 of series 2 for example).  While there are bowel loops in close proximity, no extravasation of enteric contrast identified to confirm communication.  There is no associated loculated fluid collection.  No free intraperitoneal air.  Trace free fluid within the pelvis and adjacent to the left lower quadrant pain tail catheter.  Thin-walled bladder.  Absent uterus.  No adnexal mass.  No lymphadenopathy.  There is scattered atherosclerotic calcification of the aorta and its branches. No aneurysmal dilatation.  No acute osseous abnormality.  T12 compression deformity with associated degenerative disc disease above and below this  level, unchanged.  IMPRESSION: Status post partial colectomy with left lower quadrant colostomy. Interval near complete resolution of the fluid pocket in the left lower quadrant, with a pigtail catheter in place.  Status post infraumbilical midline laparotomy with open anterior abdominal wound.  There is stranding of the subcutaneous fat and foci of gas.  No extravasation of enteric contrast to confirm communication.  See above for other nonemergent findings.  Original Report Authenticated By: Waneta Martins, M.D.     1. Abdominal  pain, other specified site       MDM  Patient comfortable at time of dc.  Explained to pt that drain has slowed up due to near resolution of fluid collection.  Pt expects to see Dr Selena Batten at nursing facility tomorrow.  Advised to discuss with him how long the drain should remain. The patient appears reasonably screened and/or stabilized for discharge and I doubt any other medical condition or other [facility] requiring further screening, evaluation, or treatment in the ED at this time prior to discharge.      Candis Musa, PA 10/27/11 2358

## 2011-10-27 NOTE — ED Notes (Signed)
Pt sent here for eval of drainage around incision site on abd

## 2011-10-31 ENCOUNTER — Telehealth (INDEPENDENT_AMBULATORY_CARE_PROVIDER_SITE_OTHER): Payer: Self-pay

## 2011-10-31 NOTE — Telephone Encounter (Signed)
Dr Selena Batten called re: pts jp drain. He called to speak with Dr Andrey Campanile. I advised him Dr Andrey Campanile will be available 2-11 and that pt has appt to see Dr Andrey Campanile re: drain also on Monday.  He states since drainage has stopped  he will send pt to IR to have drain accessed and removed if abscess has resolved.

## 2011-11-05 ENCOUNTER — Ambulatory Visit (INDEPENDENT_AMBULATORY_CARE_PROVIDER_SITE_OTHER): Payer: Medicare Other | Admitting: General Surgery

## 2011-11-05 ENCOUNTER — Encounter (INDEPENDENT_AMBULATORY_CARE_PROVIDER_SITE_OTHER): Payer: Self-pay | Admitting: General Surgery

## 2011-11-05 VITALS — BP 138/82 | HR 70 | Temp 97.8°F | Resp 18 | Ht 65.0 in | Wt 296.0 lb

## 2011-11-05 DIAGNOSIS — Z09 Encounter for follow-up examination after completed treatment for conditions other than malignant neoplasm: Secondary | ICD-10-CM

## 2011-11-05 NOTE — Progress Notes (Signed)
Chief complaint: Postop  Procedure: Status post an open sigmoid colectomy with end colostomy on January 15 Hospitalization from January 15 through the 30th  History of Present Ilness: 71 year old Caucasian female comes in today for her first postoperative appointment. She was discharged to a skilled nursing facility on January 30 with a percutaneous drain, abdominal wound VAC, and a left mid abdomen ostomy. She states that she's been doing relatively well. She states that she level is getting better. She states that she is now able to ambulate further without the aid of a walker. She denies any fevers or chills. She denies any nausea or vomiting. She states that she is having outputs her ostomy. She denies any abdominal pain. She reports minimal drainage from her percutaneous drain. She denies any passage of mucus from her rectum. She is still getting physical therapy and occupational therapy at the skilled nursing facility. She did go to the ER in early February because of decreased drainage from her drain. A repeat CT was done which showed near resolution of the fluid collection  Physical Exam: BP 138/82  Pulse 70  Temp(Src) 97.8 F (36.6 C) (Temporal)  Resp 18  Ht 5\' 5"  (1.651 m)  Wt 296 lb (134.265 kg)  BMI 49.26 kg/m2  Gen: alert, NAD, non-toxic appearing, morbidly obese, sitting in wheelchair Pupils: equal, no scleral icterus Pulm: Lungs clear to auscultation, symmetric chest rise CV: regular rate and rhythm Abd: soft, nontender, nondistended. Wound vac intact and functional. Left mid abdomen ostomy. No evid of parastomal hernia. Drain - serosang fluid Ext: no edema, no calf tenderness Skin: no rash, no jaundice    Assessment and Plan: Status post Hartman's procedure with a wound VAC.  I removed her percutaneous drain today. She is to continue the wound VAC. She is cleared to be discharged to home when stable by her rehabilitation physician.  I encouraged her to continue with  physical therapy and her exercising. I will see her back in 4 weeks' time. My plan is not to reverse her for at least 6 months.  Mary Sella. Andrey Campanile, MD, FACS General, Bariatric, & Minimally Invasive Surgery [facility], [place]

## 2011-11-05 NOTE — Patient Instructions (Signed)
Place dry gauze over drain site. Change daily until site closes. Continue wound vac

## 2011-11-12 ENCOUNTER — Ambulatory Visit (HOSPITAL_COMMUNITY)
Admission: RE | Admit: 2011-11-12 | Discharge: 2011-11-12 | Disposition: A | Discharge status: Home / Self Care | Payer: Medicare Other | Source: Ambulatory Visit | Attending: Internal Medicine | Admitting: Internal Medicine

## 2011-11-12 DIAGNOSIS — Y838 Other surgical procedures as the cause of abnormal reaction of the patient, or of later complication, without mention of misadventure at the time of the procedure: Secondary | ICD-10-CM | POA: Insufficient documentation

## 2011-11-12 DIAGNOSIS — I10 Essential (primary) hypertension: Secondary | ICD-10-CM | POA: Insufficient documentation

## 2011-11-12 DIAGNOSIS — Z01812 Encounter for preprocedural laboratory examination: Secondary | ICD-10-CM | POA: Insufficient documentation

## 2011-11-12 DIAGNOSIS — E119 Type 2 diabetes mellitus without complications: Secondary | ICD-10-CM | POA: Insufficient documentation

## 2011-11-12 DIAGNOSIS — T8140XA Infection following a procedure, unspecified, initial encounter: Secondary | ICD-10-CM | POA: Insufficient documentation

## 2011-11-12 MED ORDER — SODIUM CHLORIDE 0.9 % IJ SOLN: 10.0000 mL | Freq: Two times a day (BID) | INTRAMUSCULAR | Status: DC

## 2011-11-12 MED ORDER — SODIUM CHLORIDE 0.9 % IJ SOLN: 10.0000 mL | INTRAMUSCULAR | Status: DC | PRN

## 2011-11-12 NOTE — Discharge Instructions (Signed)
Peripherally Inserted Central Catheter (PICC)  Home Guide  A peripherally inserted central catheter (PICC) is a long, thin, flexible tube that is inserted into a vein in the upper arm. It is a form of intravenous (IV) access. It is considered to be a "central" line because the tip of the PICC ends in a large vein in your chest. This large vein is called the superior vena cava (SVC). The PICC tip ends in the SVC because there is a lot of blood flow in the SVC. This allows medicines and IV fluids to be quickly distributed throughout the body. The PICC is inserted using a sterile technique by a specially trained nurse or physician. After the PICC is inserted, a chest X-ray is done to be sure it is in the correct place.   A PICC may be placed for different reasons, such as:   To give medicines and liquid nutrition that can only be given through a central line. Examples are:   Certain antibiotic treatments.   Chemotherapy.   Total parenteral nutrition (TPN).   To take frequent blood samples.   To give IV fluids and blood products.   If there is difficulty placing a peripheral intravenous (PIV) catheter.  If taken care of properly, a PICC can remain in place for several months. A PICC can also allow patients to go home early. Medicine and PICC care can be managed at home by a family member or home healthcare team.  RISKS AND COMPLICATIONS  Possible problems with a PICC can occasionally occur. This may include:   A clot (thrombus) forming in or at the tip of the PICC. This can cause the PICC to become clogged. A "clot-busting" medicine called tissue plasminogen activator (tPA) can be inserted into the PICC to help break up the clot.   Inflammation of the vein (phlebitis) in which the PICC is placed. Signs of inflammation may include redness, pain at the insertion site, red streaks, or being able to feel a "cord" in the vein where the PICC is located.   Infection in the PICC or at the insertion site. Signs of  infection may include fever, chills, redness, swelling, or pus drainage from the PICC insertion site.   PICC movement (malposition). The PICC tip may migrate from its original position due to excessive physical activity, forceful coughing, sneezing, or vomiting.   A break or cut in the PICC. It is important to not use scissors near the PICC.   Nerve or tendon irritation or injury during PICC insertion.  HOME CARE INSTRUCTIONS  Activity   You may bend your arm and move it freely. If your PICC is near or at the bend of your elbow, avoid activity with repeated motion at the elbow.   Avoid lifting heavy objects as instructed by your caregiver.   Avoid using a crutch with the arm on the same side as your PICC. You may need to use a walker.  PICC Dressing   Keep your PICC bandage (dressing) clean and dry to prevent infection.   Ask your caregiver when you may shower. Ask your caregiver to teach you how to wrap the PICC when you do take a shower.   Do not bathe, swim, or use hot tubs when you have a PICC.   Change the PICC dressing as instructed by your caregiver.   Change your PICC dressing if it becomes loose or wet.  General PICC Care   Check the PICC insertion site daily for leakage, redness, swelling,   caregiver. Let your caregiver know right away if the PICC is difficult to flush or does not flush. Do not use force to flush the PICC.   Do not use a syringe that is less than 10 mLs to flush the PICC.   Never pull or tug on the PICC.   Avoid blood pressure checks on the arm with the PICC.   Keep your PICC identification card with you at all times.   Do not take the PICC out yourself. Only a trained clinical professional should remove the PICC.  SEEK IMMEDIATE MEDICAL CARE IF:  Your PICC is accidently pulled all the way out. If this happens, cover the insertion site with a bandage or gauze dressing. Do not throw the PICC away.  Your caregiver will need to inspect it.   Your PICC was tugged or pulled and has partially come out. Do not  push the PICC back in.   There is any type of drainage, redness, or swelling where the PICC enters the skin.   You cannot flush the PICC, it is difficult to flush, or the PICC leaks around the insertion site when it is flushed.   You hear a "flushing" sound when the PICC is flushed.   You have pain, discomfort, or numbness in your arm, shoulder, or jaw on the same side as the PICC .   You feel your heart "racing" or skipping beats.   You notice a hole or tear in the PICC.   You develop chills or a fever.  MAKE SURE YOU:   Understand these instructions.   Will watch your condition.   Will get help right away if you are not doing well or get worse.  Call Jakai Risse or Victorino Dike for any PICC line problems

## 2011-12-13 ENCOUNTER — Ambulatory Visit (INDEPENDENT_AMBULATORY_CARE_PROVIDER_SITE_OTHER): Payer: Medicare Other | Admitting: General Surgery

## 2011-12-13 ENCOUNTER — Telehealth (INDEPENDENT_AMBULATORY_CARE_PROVIDER_SITE_OTHER): Payer: Self-pay | Admitting: General Surgery

## 2011-12-13 ENCOUNTER — Encounter (INDEPENDENT_AMBULATORY_CARE_PROVIDER_SITE_OTHER): Payer: Self-pay | Admitting: General Surgery

## 2011-12-13 VITALS — BP 110/78 | HR 94 | Temp 97.5°F | Ht 65.0 in | Wt 289.8 lb

## 2011-12-13 DIAGNOSIS — Z09 Encounter for follow-up examination after completed treatment for conditions other than malignant neoplasm: Secondary | ICD-10-CM

## 2011-12-13 NOTE — Patient Instructions (Signed)
Continue to avoid lifting/pushing/pulling anything greater than 15 pounds Eat a heart healthy diabetic diet.

## 2011-12-13 NOTE — Progress Notes (Signed)
Chief complaint: Postop  Procedure: Status post sigmoid colectomy, end colostomy for perforated diverticulitis January 15  History of Present Ilness: 71 year old Caucasian female who comes in for followup. I last saw her on February 11. Since that time she was just released from rehabilitation today. She is still getting a wound VAC changed on Monday Wednesday and Fridays. She states that she developed some redness around her skin incision last week. She states a culture was obtained late last week and the rehabilitation physician called in an antibiotic prescription that she is going to pick up today. She states that antibiotic Bactrim. She denies any fevers or chills. She denies any nausea or vomiting. She reports good ostomy output which is now thickened. She only has some incisional pain primarily around wound VAC change. She reports a good appetite. She did have a bowel movement per rectum about 2 weeks ago. She states that her blood sugars have been between 88-110  Physical Exam: BP 110/78  Pulse 94  Temp(Src) 97.5 F (36.4 C) (Temporal)  Ht 5\' 5"  (1.651 m)  Wt 289 lb 12.8 oz (131.452 kg)  BMI 48.23 kg/m2  SpO2 94%  Well-developed, well-nourished morbidly obese Caucasian female in no apparent distress Lungs are clear Regular rate rhythm Abdomen soft, nontender, nondistended. Colostomy in the left midabdomen. Wound VAC is intact in the midline. There is some cellulitis underneath the plastic adhesive. There also appears to be a few blisters around the skin incision itself.  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  Overall I think she is doing well. She has lost about 6 pounds since her last visit.  I encouraged her to start taking antibiotics as prescribed. I do not have a copy of the culture report. I do want to see her back in 2 weeks to reexamine her abdominal wall. I told her to call the office should the redness  continue to spread. We will schedule her appointment on a Monday, Wednesday, or Fri. so that we can do a wound VAC change in the office and I can carefully examine the wound  Mary Sella. Andrey Campanile, MD, FACS General, Bariatric, & Minimally Invasive Surgery [facility], [place]

## 2011-12-13 NOTE — Telephone Encounter (Signed)
Left message on machine with patient's appt.

## 2011-12-13 NOTE — Telephone Encounter (Signed)
Appt made 01/04/12 @ 2:30pm.

## 2012-01-02 ENCOUNTER — Telehealth (INDEPENDENT_AMBULATORY_CARE_PROVIDER_SITE_OTHER): Payer: Self-pay | Admitting: General Surgery

## 2012-01-02 NOTE — Telephone Encounter (Signed)
Spoke with Micah Noel, RN and advised per Dr Andrey Campanile, okay to change to calcium alginate dressing. She will start tomorrow so we can check wound on Friday at follow up appt.

## 2012-01-02 NOTE — Telephone Encounter (Signed)
Michelle Noel, RN, with [facility], calling with update and suggestion on pt's wound treatment.  Pt has wound vac in place and the wound is very much improved since it was applied.  She is due to appt with Dr. Andrey Campanile on Friday, 01/04/12. The nurse is suggesting changing wound care to calcium alginate with foam adhesive, to be changed 2-3x week, after her wound is assessed at the office visit.  She suggests calling her if this is acceptable so she can order the supplies in advance for [name].  He cell # [phone].

## 2012-01-04 ENCOUNTER — Ambulatory Visit (INDEPENDENT_AMBULATORY_CARE_PROVIDER_SITE_OTHER): Payer: Medicare Other | Admitting: General Surgery

## 2012-01-04 ENCOUNTER — Encounter (INDEPENDENT_AMBULATORY_CARE_PROVIDER_SITE_OTHER): Payer: Self-pay | Admitting: General Surgery

## 2012-01-04 VITALS — BP 128/78 | HR 76 | Temp 97.6°F | Resp 18 | Ht 65.0 in | Wt 290.8 lb

## 2012-01-04 DIAGNOSIS — Z09 Encounter for follow-up examination after completed treatment for conditions other than malignant neoplasm: Secondary | ICD-10-CM

## 2012-01-04 DIAGNOSIS — R634 Abnormal weight loss: Secondary | ICD-10-CM

## 2012-01-04 DIAGNOSIS — E119 Type 2 diabetes mellitus without complications: Secondary | ICD-10-CM

## 2012-01-04 NOTE — Patient Instructions (Signed)
We will refer you to the diabetes and nutrition management services at [facility]

## 2012-01-04 NOTE — Progress Notes (Signed)
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 I last saw her on March 21. We were managing her midline incision with a wound VAC. This past week the wound VAC was discontinued and switched to calcium alginate. She denies any fevers or chills. She denies any nausea or vomiting. She denies any problems with her ostomy. She states that the redness around her midline incision is improving. She has passed 2 small stool balls from her rectum the other night. She reports a good appetite. She would like a prescription for Lasix because of some intermittent leg swelling.  Physical Exam: BP 128/78  Pulse 76  Temp(Src) 97.6 F (36.4 C) (Temporal)  Resp 18  Ht 5\' 5"  (1.651 m)  Wt 290 lb 12.8 oz (131.906 kg)  BMI 48.39 kg/m2  Gen: alert, NAD, non-toxic appearing; morbidly obese Pupils: equal, no scleral icterus Pulm: Lungs clear to auscultation, symmetric chest rise CV: regular rate and rhythm Abd: soft, nontender, nondistended. Obese. No cellulitis. No incisional hernia. Midline incision -healing nicely by 2nd intention. Wound measure 16cm long x 3cm in widest part. Depth about 2mm. Good granulation tissue. Ostomy in left quadrant - no sign of parastomal hernia. Resolving skin tears around midline.  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 I agree with the calcium alginate dressings for now.  I explained to her that I do not feel comfortable giving her a prescription for Lasix since she is on 2 other blood pressure medications. I advised her to contact her primary care  physician regarding this.  We will refer her to the nutrition and diabetes management service to talk about proper food choices in order to try to help her lose weight. Followup 6 weeks  Mary Sella. Andrey Campanile, MD, FACS General, Bariatric, & Minimally Invasive Surgery [facility], [place]

## 2012-01-07 ENCOUNTER — Telehealth (HOSPITAL_COMMUNITY): Payer: Self-pay | Admitting: Dietician

## 2012-01-07 NOTE — Telephone Encounter (Signed)
Pt reports that she was referred by Dr. Andrey Campanile for nutrition counseling for diabetes. She reports there is a referral in the computer for it; checked office note and referral tab and noted that pt was referred to [facility] in [place]. She reports she called [facility] and does not desire to travel to [place] for appointment if there are services in [place]. She reports that she was instructed by the [facility] office staff to call [facility] to inquire about nutrition services. I explained services offered to [facility] (individual counseling with MD referral or diabetes classes) to pt. She was grateful for information, but does not desire to make an appointment at this time, due to her and her husband's multiple upcoming medical appointments. She reports she will follow-up when she is ready. Provided RD contact information.

## 2012-01-09 ENCOUNTER — Telehealth (INDEPENDENT_AMBULATORY_CARE_PROVIDER_SITE_OTHER): Payer: Self-pay | Admitting: General Surgery

## 2012-01-09 ENCOUNTER — Other Ambulatory Visit (HOSPITAL_COMMUNITY): Payer: Self-pay | Admitting: Family Medicine

## 2012-01-09 DIAGNOSIS — M79609 Pain in unspecified limb: Secondary | ICD-10-CM

## 2012-01-09 NOTE — Telephone Encounter (Signed)
Patient was referred to nutrition and diabetes center. [place] and wants to know if okay to attend program there. I made her aware this would be okay. She will call with any problems.

## 2012-01-10 ENCOUNTER — Ambulatory Visit (HOSPITAL_COMMUNITY)
Admission: RE | Admit: 2012-01-10 | Discharge: 2012-01-10 | Disposition: A | Discharge status: Home / Self Care | Payer: Medicare Other | Source: Ambulatory Visit | Attending: Family Medicine | Admitting: Family Medicine

## 2012-01-10 ENCOUNTER — Other Ambulatory Visit (HOSPITAL_COMMUNITY): Payer: Self-pay | Admitting: Family Medicine

## 2012-01-10 DIAGNOSIS — M79609 Pain in unspecified limb: Secondary | ICD-10-CM

## 2012-01-10 DIAGNOSIS — M7989 Other specified soft tissue disorders: Secondary | ICD-10-CM | POA: Insufficient documentation

## 2012-01-30 ENCOUNTER — Telehealth (HOSPITAL_COMMUNITY): Payer: Self-pay | Admitting: Dietician

## 2012-01-30 NOTE — Telephone Encounter (Signed)
See previous telephone encounter for details. Appointment scheduled for 02/06/12 at 1:30 PM. Appointment confirmation letter sent to pt home via [place] [employer].

## 2012-02-06 ENCOUNTER — Encounter (HOSPITAL_COMMUNITY): Payer: Self-pay | Admitting: Dietician

## 2012-02-06 NOTE — Progress Notes (Signed)
Outpatient Initial Nutrition Assessment  Date:02/06/2012   Time: 1:30 PM  Referring Physician: Dr. Andrey Campanile Reason for Visit: obesity  PCP is Dr. Phillips Odor at [facility].  Pt was originally referred to [facility] in [place], but referral transferred to [facility], due to pt convenience, as pt lives in [place].   Nutrition Assessment:  Height: 5\' 5"  (165.1 cm)   Weight: 292 lb (132.45 kg)   IBW: 130# %IBW: 225% UBW: 290# %UBW: 101% Body mass index is 48.59 kg/(m^2).  Goal Weight: 263# Weight hx: Pt reports her lowest weight was 114#, 52 years ago when she got married. Her highest weight was 310#, 6 months ago per pt report. She has lost 12# (5.8) x 6 months.   Estimated nutritional needs: 2082-2271 kcals daily, 103-133 grams protein daily, 2.1-2.3 L fluid daily  PMH:  Past Medical History  Diagnosis Date  . Hypertension   . Diabetes mellitus   . Arthritis   . Anemia   . Diverticulitis     Medications:  Current Outpatient Rx  Name Route Sig Dispense Refill  . CALCIUM-VITAMIN D PO Oral Take 2 tablets by mouth daily.    Marland Kitchen ESTRADIOL 0.05 MG/24HR TD PTTW Transdermal Place 1 patch onto the skin 2 (two) times a week.    Marland Kitchen GLUCOSE BLOOD VI STRP Other 1 each by Other route as needed. Use as instructed    . HYDROCODONE-ACETAMINOPHEN 10-325 MG PO TABS      . LOSARTAN POTASSIUM 100 MG PO TABS Oral Take 100 mg by mouth daily.    . MELOXICAM 15 MG PO TABS Oral Take 15 mg by mouth daily.    Marland Kitchen METFORMIN HCL 500 MG PO TABS Oral Take 500 mg by mouth 2 (two) times daily with a meal.    . MONTELUKAST SODIUM 10 MG PO TABS Oral Take 10 mg by mouth at bedtime.    Marland Kitchen PHENYLEPHRINE HCL 0.5 % NA SOLN Nasal Place 1 drop into the nose as needed. Nasal Congestion.    Marland Kitchen SITAGLIPTIN PHOSPHATE 50 MG PO TABS Oral Take 50 mg by mouth daily.      Labs: CMP     Component Value Date/Time   NA 139 10/27/2011 1821   K 3.5 10/27/2011 1821   CL 102 10/27/2011 1821   CO2 26 10/27/2011 1821   GLUCOSE 123* 10/27/2011 1821   BUN 13 10/27/2011 1821   CREATININE 0.67 10/27/2011 1821   CALCIUM 8.9 10/27/2011 1821   PROT 5.3* 10/20/2011 1300   ALBUMIN 2.1* 10/20/2011 1300   AST 17 10/20/2011 1300   ALT 21 10/20/2011 1300   ALKPHOS 97 10/20/2011 1300   BILITOT 0.3 10/20/2011 1300   GFRNONAA 86* 10/27/2011 1821   GFRAA >90 10/27/2011 1821    Lipid Panel  No results found for this basename: chol, trig, hdl, cholhdl, vldl, ldlcalc     Lab Results  Component Value Date   HGBA1C 6.7* 10/09/2011   Lab Results  Component Value Date   CREATININE 0.67 10/27/2011     Lifestyle/ social habits: 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 She lives in [place] with her husband and grandson. She receives Home Health nursing services 7 days per week, due to her colostomy. Her primary nurse, Corrie Dandy, accompanied her today. She is s/p sigmoid colectomy and is followed by Dr. Andrey Campanile. She was recently at [facility], where she stayed for 2.5 months for rehab.   Nutrition hx/habits: Ms. Lenhardt reports she has dieted frequently over  the past several years. She would like to learn to "eat proper" so she can lose weight. Her biggest barrier is lack of physical activity; she has very poor balance and has been on 10# lifting restriction. She also reports problems with fluid retention. Prior to her surgery, she did pool exercises 5 days a week since 2004. Prior to 2004, she was an avid Teacher, English as a foreign language. Currently, she is only able to walk on the treadmill for 1-2 minutes at a time, which she does a few times a week. She would like to work up to longer periods of physical activity. She loves to eat vegetables. She reports that she was served Glass blower/designer" at [employer], but the [employer] worked with her to serve more healthy items. She loves to cook and does the majority of the cooking for her household. She drinks mostly water and Splenda-sweetened drinks. She eats out 2 times a week at [employer] or cafeteria-type restaurants, where she chooses  either a steak or veggie plate.  CBGs are in good control. She checks 2-3 times daily and readings range from 110-130. She believes she was diagnosed with diabetes between 2009-2010.   Diet recall: Breakfast (8:15-8:30 AM): poached egg, piece of toast, 1.5 pieces of bacon, 1/4 cup cottage cheese, 3/4 cup skim milk; Lunch (1:00 PM): tuna salad with celery, pickles, mayo) and marinated carrot salad (carrots, low salt tomato soup, vinegar, bell pepper, onion); Dinner (5:30-6 PM): piece of grilled chicken, marinated carrot salad, boiled and browned potatoes; Snack: sugar free jello with fruit  Nutrition Diagnosis: Involuntary weight gain r/t physical inactivity AEB BMI: 48.59.  Nutrition Intervention: Nutrition rx: 1500 kcal NAS, diabetic diet; 3 meals per day; limit 1 starch per meal; low calorie beverages; physical activity as tolerated  Education/Counseling Provided: Educated pt on plate method. Discussed portion control and importance of a balanced diet containing all food groups. Discussed calorie content of commonly eaten foods. Discussed importance of physical activity as tolerated to assist with weight loss. Discussed importance of keeping a food diary to monitor calorie intake. Showed pt functionality of MyFitnessPal app. Provided plate method and 1500 kcal diet sheet from Academy of [employer]etics.   Understanding, Motivation, Ability to Follow Recommendations: Expect fair to good compliance.   Monitoring and Evaluation: Goals: 1) 1-2# weight loss per week; 2) Physical activity as tolerated  Recommendations: 1) For weight loss: 1582-1771 kcals daily; 2) Keep food diary; 3) Break physical activity up into smaller, more frequent sessions; 4) Pt may benefit from PT  F/U: PRN. Provided RD contact information.  Orlene Plum, RD  02/06/2012  Time: 1:30 PM

## 2012-02-19 ENCOUNTER — Ambulatory Visit (INDEPENDENT_AMBULATORY_CARE_PROVIDER_SITE_OTHER): Payer: Medicare Other | Admitting: General Surgery

## 2012-02-19 ENCOUNTER — Encounter (INDEPENDENT_AMBULATORY_CARE_PROVIDER_SITE_OTHER): Payer: Self-pay | Admitting: General Surgery

## 2012-02-19 VITALS — BP 142/78 | HR 64 | Temp 98.0°F | Resp 20 | Ht 65.0 in | Wt 293.0 lb

## 2012-02-19 DIAGNOSIS — Z4889 Encounter for other specified surgical aftercare: Secondary | ICD-10-CM

## 2012-02-19 NOTE — Patient Instructions (Signed)
Try walking three times a week

## 2012-02-19 NOTE — Progress Notes (Signed)
Subjective:     Patient ID: [name], female   DOB: Feb 21, 1941, 71 y.o.   MRN: [id number]  HPI 71 year old Caucasian female comes in for long-term followup for her sigmoid colectomy and end colostomy that was performed emergently for her perforated sigmoid diverticulitis in January 2013. I last saw her on April 12. We had let her midline incision closed by secondary intention. The wound VAC had been removed just prior to her last visit. She now has what appears to be calcium alginate in her midline incision. She denies any fevers or chills. She denies any nausea or vomiting. She denies any problems with her ostomy. It did bleed a little bit the other week. She denies any poor appetite. She states that she has been having some intermittent problems with swelling in her legs and has been taking her Lasix. She saw the nutritionist; but, she felt she did not get much out of the appointment. She has gained 3 pounds since her visit with the nutritionist.  PMHx, PSHx, SOCHx, FAMHx, ALL reviewed and unchanged  Review of Systems See above    Objective:   Physical Exam BP 142/78  Pulse 64  Temp(Src) 98 F (36.7 C) (Temporal)  Resp 20  Ht 5\' 5"  (1.651 m)  Wt 293 lb (132.904 kg)  BMI 48.76 kg/m2  Morbidly obese cta Soft, obese, almost healed midline incision; no cellulitis. Has about 8cm long stretch down midline where skin separated by 3mm, depth 2mm; ostomy Left quad - bag intact No edema    Assessment:     70yo WF s/p Hartmann's procedure for perforated diverticulitis 1/15    Plan:     I explained to the patient that I would offer her reversal of her ostomy in mid to late summer. I explained to her that I would want her midline incision healed before we go back to the operating room. We rediscussed the importance of weight loss and some form of exercise in order to decrease her perioperative complications as well as to decrease her chance of forming an incisional hernia. The patient was  not interested in the physical therapy referral. Followup 2 months  Mary Sella. Andrey Campanile, MD, FACS General, Bariatric, & Minimally Invasive Surgery [facility], [place]

## 2012-04-23 ENCOUNTER — Ambulatory Visit (INDEPENDENT_AMBULATORY_CARE_PROVIDER_SITE_OTHER): Payer: Medicare Other | Admitting: General Surgery

## 2012-04-23 ENCOUNTER — Encounter (INDEPENDENT_AMBULATORY_CARE_PROVIDER_SITE_OTHER): Payer: Self-pay | Admitting: General Surgery

## 2012-04-23 VITALS — BP 142/74 | HR 76 | Temp 96.8°F | Ht 65.0 in | Wt 291.8 lb

## 2012-04-23 DIAGNOSIS — K5732 Diverticulitis of large intestine without perforation or abscess without bleeding: Secondary | ICD-10-CM

## 2012-04-23 DIAGNOSIS — K5720 Diverticulitis of large intestine with perforation and abscess without bleeding: Secondary | ICD-10-CM

## 2012-04-23 NOTE — Patient Instructions (Signed)
Call us with your decision of how you would like to proceed

## 2012-04-25 ENCOUNTER — Telehealth (INDEPENDENT_AMBULATORY_CARE_PROVIDER_SITE_OTHER): Payer: Self-pay | Admitting: General Surgery

## 2012-04-25 DIAGNOSIS — K5792 Diverticulitis of intestine, part unspecified, without perforation or abscess without bleeding: Secondary | ICD-10-CM

## 2012-04-25 NOTE — Telephone Encounter (Signed)
Patient needs a barium enema prior to scheduling. Will call her next week and discuss test and get this set up.

## 2012-04-25 NOTE — Telephone Encounter (Signed)
Message copied by Liliana Cline on Fri Apr 25, 2012  2:25 PM ------      Message from: [place], [place]      Created: Fri Apr 25, 2012  9:33 AM      Regarding: ORDERS      Contact: [phone]       Patient called and would like to sch colostomy reversal. Will need epic and paper orders. She would also like to ask some ? For nurse or Dr Andrey Campanile. Thanks pat e [phone]

## 2012-04-27 NOTE — Progress Notes (Signed)
Patient ID: [name], female   DOB: [birth year], 71 y.o.   MRN: [id number]  Chief Complaint  Patient presents with  . Follow-up    reck colostomy    HPI [name] is a 71 y.o. female.   HPI 71 year old morbidly obese Caucasian female comes in for long-term followup after undergoing an emergency Hartman's procedure on January 15 for perforated sigmoid diverticulitis. I last saw in the office on May 28. She states that she has been doing well. She denies any fever, chills, nausea, or vomiting. She denies any abdominal pain. She reports that her ostomy is functioning normally and having good daily stool output. She denies any weight loss. She states that she hasn't really been able to exercise hardly any at all due to low back pain and knee pain when she gets on her treadmill. She is eager to get her ostomy reversed.  She denies any new medical problems  Past Medical History  Diagnosis Date  . Hypertension   . Diabetes mellitus   . Arthritis   . Anemia   . Diverticulitis     Past Surgical History  Procedure Date  . Appendectomy   . Back surgery   . Colon resection 10/09/2011    Procedure: COLON RESECTION;  Surgeon: Atilano Ina, MD;  Location: [facility] OR;  Service: General;  Laterality: N/A;  Colon Resection with colostomy  . Colostomy   . Jackson pratt   . Application of wound vac wound vac  . Abdominal hysterectomy 1979    Family History  Problem Relation Age of Onset  . Heart disease Father   . Heart disease Brother     Social History History  Substance Use Topics  . Smoking status: Former Smoker    Quit date: 09/04/2011  . Smokeless tobacco: Never Used  . Alcohol Use: No    Allergies  Allergen Reactions  . Codeine Itching    Current Outpatient Prescriptions  Medication Sig Dispense Refill  . CALCIUM-VITAMIN D PO Take 2 tablets by mouth daily.      . furosemide (LASIX) 20 MG tablet       . glucose blood test strip 1 each by Other route as needed. Use as  instructed      . HYDROcodone-acetaminophen (NORCO) 10-325 MG per tablet       . losartan (COZAAR) 100 MG tablet Take 100 mg by mouth daily.      . meloxicam (MOBIC) 15 MG tablet Take 15 mg by mouth daily.      . metFORMIN (GLUCOPHAGE) 500 MG tablet Take 500 mg by mouth 2 (two) times daily with a meal.      . montelukast (SINGULAIR) 10 MG tablet Take 10 mg by mouth at bedtime.      . phenylephrine (VICKS SINEX ULTRA FINE MIST) 0.5 % nasal solution Place 1 drop into the nose as needed. Nasal Congestion.      . sitaGLIPtin (JANUVIA) 50 MG tablet Take 50 mg by mouth daily.      . traMADol (ULTRAM) 50 MG tablet         Review of Systems Review of Systems  Constitutional: Negative for fever, appetite change and unexpected weight change.  HENT: Negative for nosebleeds and neck stiffness.   Eyes: Negative for photophobia, discharge and redness.  Respiratory: Negative for chest tightness, shortness of breath and wheezing.        Denies PND, orthopnea. Some DOE. Doesn't get much activity due to joint pain  Cardiovascular:  Negative for chest pain, palpitations and leg swelling.  Gastrointestinal: Negative for nausea, vomiting, abdominal pain and blood in stool.       Nothing per rectum recently  Genitourinary: Negative for dysuria and difficulty urinating.  Musculoskeletal:       Bad b/l knee pain and some lower back pain  Skin: Negative for rash.  Neurological: Negative for dizziness, tremors, seizures, syncope and light-headedness.       Denies TIA, amaurosis fugax  Hematological: Does not bruise/bleed easily.  Psychiatric/Behavioral: The patient is not nervous/anxious.     Blood pressure 142/74, pulse 76, temperature 96.8 F (36 C), temperature source Temporal, height 5\' 5"  (1.651 m), weight 291 lb 12.8 oz (132.36 kg), SpO2 94.00%.  Physical Exam Physical Exam  Vitals reviewed. Constitutional: She is oriented to person, place, and time. She appears well-developed and well-nourished. No  distress.       Morbidly obese  HENT:  Head: Normocephalic and atraumatic.  Right Ear: External ear normal.  Left Ear: External ear normal.  Eyes: Conjunctivae are normal. No scleral icterus.  Neck: Normal range of motion. Neck supple. No tracheal deviation present. No thyromegaly present.  Cardiovascular: Normal rate, regular rhythm and normal heart sounds.   Pulmonary/Chest: Effort normal and breath sounds normal. No respiratory distress. She has no wheezes.  Abdominal: Soft. There is no tenderness. There is no rebound.       Very obese abdomen. Large hypertrophic midline scar - in 1 area for about 1 in long -skin separated by about 2mm. No cellulitis. No rash. Skin intact. Pink viable ostomy LLQ. No obvious parastomal or incisional hernia- but exam somewhat limited by body habitus  Musculoskeletal: She exhibits no edema and no tenderness.  Neurological: She is alert and oriented to person, place, and time. No cranial nerve deficit. She exhibits normal muscle tone.  Skin: Skin is warm and dry. She is not diaphoretic.  Psychiatric: She has a normal mood and affect. Her behavior is normal. Judgment and thought content normal.    Data Reviewed My previous note Path report from colonic resection  Assessment    Morbid obesity-BMI 48 History of perforated sigmoid diverticulitis status post Hartman's procedure Diabetes mellitus Hypertension    Plan    She is now 6 months out from her surgery. She desires ostomy reversal.  As we have discussed on several occasions during her previous visit, I explained that the earliest I would consider ostomy reversal would be 6 months minimum. During the past several months I have been encouraging her to lose weight. I even referred her to the nutritionist; however, she found the appointment uninformative and not helpful. She has not lost any significant amount of weight. I have explained on several occasions that if we proceed with ostomy reversal with  her current BMI she is at a higher risk for intraoperative and postoperative complications including but not limited to surgical site wound infection, anastomotic leak, and a higher risk for hernia formation. I also explained that this procedure is elective procedure and that she could live indefinitely with an ostomy. However I understand her desire to regain normal bowel anatomy.  I discussed the procedure in detail.  The patient was given Agricultural engineer.  We discussed the risks and benefits of surgery including, but not limited to bleeding, infection (such as wound infection, abdominal abscess), injury to surrounding structures, blood clot formation, urinary retention, incisional hernia, anastomotic stricture, anastomotic leak, anesthesia risks, pulmonary & cardiac complications such as pneumonia &/or heart  attack, need for additional procedures, ileus, & prolonged hospitalization and rare possibility of death. I explained that the old ostomy site would be partially closed and she may have a wound vac in place.  We discussed the typical postoperative recovery course which would include a 5-7 day hospitalization barring any complications, including limitations & restrictions postoperatively.   I encouraged her to think about what we discussed today for several days and discuss it with her family prior to making a decision. I instructed her to contact our office with her decision. I did explain that prior to surgery we would need to image her colon to make sure there is no anatomical abnormalities. I would also like for her to get medical clearance from her primary care doctor prior to scheduling surgery  Mary Sella. Andrey Campanile, MD, FACS General, Bariatric, & Minimally Invasive Surgery [facility], [place]        [facility] M 04/27/2012, 9:22 AM

## 2012-04-29 ENCOUNTER — Telehealth (INDEPENDENT_AMBULATORY_CARE_PROVIDER_SITE_OTHER): Payer: Self-pay | Admitting: General Surgery

## 2012-04-29 NOTE — Telephone Encounter (Signed)
Barium enema set up 05/01/12 @ 8:45 am at [facility]. Patient aware. She will pick up prep kit here. At front for her to pick up. Medical clearance request sent to Dr Phillips Odor.

## 2012-04-29 NOTE — Telephone Encounter (Signed)
Pt called with questions about her barium enema on 05/01/12.  The best number to reach her back at is [phone].  Advised the patient that Dr. Tawana Scale nurse should call her when she gets a moment to go over it with her.

## 2012-04-30 NOTE — Telephone Encounter (Signed)
Directions explained. Patient will call back with any other questions.

## 2012-05-01 ENCOUNTER — Telehealth (INDEPENDENT_AMBULATORY_CARE_PROVIDER_SITE_OTHER): Payer: Self-pay | Admitting: General Surgery

## 2012-05-01 ENCOUNTER — Ambulatory Visit (HOSPITAL_COMMUNITY)
Admission: RE | Admit: 2012-05-01 | Discharge: 2012-05-01 | Disposition: A | Discharge status: Home / Self Care | Payer: Medicare Other | Source: Ambulatory Visit | Attending: General Surgery | Admitting: General Surgery

## 2012-05-01 DIAGNOSIS — K5792 Diverticulitis of intestine, part unspecified, without perforation or abscess without bleeding: Secondary | ICD-10-CM

## 2012-05-01 DIAGNOSIS — Z933 Colostomy status: Secondary | ICD-10-CM | POA: Insufficient documentation

## 2012-05-01 DIAGNOSIS — K5732 Diverticulitis of large intestine without perforation or abscess without bleeding: Secondary | ICD-10-CM | POA: Insufficient documentation

## 2012-05-01 NOTE — Telephone Encounter (Signed)
Patient aware barium enema okay. Awaiting medical clearance and then we will set up surgery for patient. She will call with any questions prior.

## 2012-05-01 NOTE — Telephone Encounter (Signed)
Message copied by Liliana Cline on Thu May 01, 2012  5:38 PM ------      Message from: Andrey Campanile, ERIC M      Created: Thu May 01, 2012  3:37 PM       Barium enema ok for surgery. Will schedule for reversal once we get med clearance

## 2012-05-05 ENCOUNTER — Telehealth (INDEPENDENT_AMBULATORY_CARE_PROVIDER_SITE_OTHER): Payer: Self-pay | Admitting: General Surgery

## 2012-05-05 NOTE — Telephone Encounter (Signed)
Patient aware she needs to make appt with Dr Phillips Odor.

## 2012-05-05 NOTE — Telephone Encounter (Signed)
Received medical clearance with a note that said she needed cardiology referral. I questioned with the nurse for Dr Phillips Odor and they confused patient with a patient with same name. Advised she does not need cardiology consult but does need an appt with Dr Phillips Odor so he can clear her.

## 2012-06-02 ENCOUNTER — Telehealth (INDEPENDENT_AMBULATORY_CARE_PROVIDER_SITE_OTHER): Payer: Self-pay | Admitting: General Surgery

## 2012-06-02 NOTE — Telephone Encounter (Signed)
Pat with surgery scheduling spoke with patient and patient stated she saw Dr Phillips Odor 05/12/12 and she saw them fax clearance to [place]. I do not have clearance and this is not scanned into the chart. I called Dr Beatrice Lecher office to have them fax clearance to [place]. Left message for them to get message to Dr Lamar Blinks nurse. Awaiting clearance.

## 2012-06-03 NOTE — Telephone Encounter (Signed)
Clearance received. Taken to surgery scheduling. Copy to [facility] for review.

## 2012-06-13 ENCOUNTER — Other Ambulatory Visit (INDEPENDENT_AMBULATORY_CARE_PROVIDER_SITE_OTHER): Payer: Self-pay | Admitting: General Surgery

## 2012-06-13 NOTE — Progress Notes (Signed)
NEED ORDERS PUT IN EPIC PLEASE FOR PREOP 06/23/12 THANK YOU

## 2012-06-16 ENCOUNTER — Encounter (HOSPITAL_COMMUNITY): Payer: Self-pay | Admitting: Pharmacy Technician

## 2012-06-17 ENCOUNTER — Telehealth (INDEPENDENT_AMBULATORY_CARE_PROVIDER_SITE_OTHER): Payer: Self-pay | Admitting: General Surgery

## 2012-06-17 MED ORDER — PEG-KCL-NACL-NASULF-NA ASC-C 100 G PO SOLR: 1.0000 | Freq: Once | ORAL | Status: DC

## 2012-06-17 NOTE — Telephone Encounter (Signed)
Message copied by Liliana Cline on Tue Jun 17, 2012  9:59 AM ------      Message from: Andrey Campanile, ERIC M      Created: Tue Jun 17, 2012  1:13 AM       Movi                  ----- Message -----         From: Liliana Cline, CMA         Sent: 06/16/2012   3:59 PM           To: Atilano Ina, MD,FACS            What prep do you want to give. Movi or Golytely?             ----- Message -----         From: Atilano Ina, MD,FACS         Sent: 06/13/2012   5:41 PM           To: Liliana Cline, CMA            This pt will need an enema the night before and a bowel prep prior to surgery            Thanks      wilson

## 2012-06-17 NOTE — Telephone Encounter (Signed)
Left message on machine for patient to call back and ask for me. Patient needs pre-op bowel prep. Per Dr Andrey Campanile to have Movi-prep. This was called to her pharmacy. She also needs to do one fleets enema per rectum the pm prior to surgery. She can get this over the counter. Instruction sheet mailed to patient.

## 2012-06-19 NOTE — Telephone Encounter (Signed)
Left message on machine for patient to call back and ask for me again.

## 2012-06-19 NOTE — Telephone Encounter (Signed)
Patient aware of prep and has picked up her Moviprep from pharmacy.

## 2012-06-20 NOTE — Patient Instructions (Addendum)
20 [name]  06/20/2012   Your procedure is scheduled on:  06/26/12 0900am-12noon  Report to [facility] at 0630 AM.  Call this number if you have problems the morning of surgery: [phone]   Remember: One day Bowel Prep per MD                                   Fleets enema nite before surgery per MD     Do not eat food:After Midnight.  May have clear liquids:until Midnight .    Take these medicines the morning of surgery with A SIP OF WATER:   Do not wear jewelry, make-up or nail polish.  Do not wear lotions, powders, or perfumes.   Do not shave 48 hours prior to surgery.   Do not bring valuables to the hospital.  Contacts, dentures or bridgework may not be worn into surgery.  Leave suitcase in the car. After surgery it may be brought to your room.  For patients admitted to the hospital, checkout time is 11:00 AM the day of discharge.   Special Instructions: SEE CHG INSTRUCTION SHEET    Please read over the following fact sheets that you were given: MRSA Information, coughing and deep breathing exercises, leg exercises, Blood Transfusion Fact Sheet

## 2012-06-23 ENCOUNTER — Encounter (HOSPITAL_COMMUNITY)
Admission: RE | Admit: 2012-06-23 | Discharge: 2012-06-23 | Disposition: A | Discharge status: Home / Self Care | Payer: Medicare Other | Source: Ambulatory Visit | Attending: General Surgery | Admitting: General Surgery

## 2012-06-23 ENCOUNTER — Encounter (HOSPITAL_COMMUNITY): Payer: Self-pay

## 2012-06-23 ENCOUNTER — Other Ambulatory Visit (HOSPITAL_COMMUNITY): Payer: Medicare Other

## 2012-06-23 HISTORY — DX: Shortness of breath: R06.02

## 2012-06-23 HISTORY — DX: Sleep apnea, unspecified: G47.30

## 2012-06-23 LAB — COMPREHENSIVE METABOLIC PANEL
ALT: 15 U/L (ref 0–35)
Alkaline Phosphatase: 87 U/L (ref 39–117)
BUN: 19 mg/dL (ref 6–23)
CO2: 29 mEq/L (ref 19–32)
Chloride: 103 mEq/L (ref 96–112)
GFR calc Af Amer: >90 mL/min (ref >90)
GFR calc non Af Amer: 83 mL/min — ABNORMAL LOW (ref >90)
Glucose, Bld: 114 mg/dL — ABNORMAL HIGH (ref 70–99)
Potassium: 4.4 mEq/L (ref 3.5–5.1)
Sodium: 139 mEq/L (ref 135–145)
Total Bilirubin: 0.3 mg/dL (ref 0.3–1.2)

## 2012-06-23 LAB — CBC WITH DIFFERENTIAL/PLATELET
Eosinophils Absolute: 0.3 10*3/uL (ref 0.0–0.7)
Hemoglobin: 12.3 g/dL (ref 12.0–15.0)
Lymphocytes Relative: 31 % (ref 12–46)
Lymphs Abs: 4 10*3/uL (ref 0.7–4.0)
MCH: 24.5 pg — ABNORMAL LOW (ref 26.0–34.0)
Monocytes Relative: 7 % (ref 3–12)
Neutrophils Relative %: 59 % (ref 43–77)
Platelets: 345 10*3/uL (ref 150–400)
RBC: 5.03 MIL/uL (ref 3.87–5.11)
WBC: 12.7 10*3/uL — ABNORMAL HIGH (ref 4.0–10.5)

## 2012-06-23 NOTE — Progress Notes (Signed)
Patient is aware of one day bowel prep and fleets enema instructions per office.

## 2012-06-24 ENCOUNTER — Other Ambulatory Visit (INDEPENDENT_AMBULATORY_CARE_PROVIDER_SITE_OTHER): Payer: Self-pay | Admitting: General Surgery

## 2012-06-24 LAB — SURGICAL PCR SCREEN: MRSA, PCR: POSITIVE — AB

## 2012-06-24 MED ORDER — CIPROFLOXACIN IN D5W 400 MG/200ML IV SOLN: 400.0000 mg | INTRAVENOUS | Status: DC

## 2012-06-24 MED ORDER — LINEZOLID 2 MG/ML IV SOLN: 600.0000 mg | INTRAVENOUS | Status: DC

## 2012-06-26 ENCOUNTER — Encounter (HOSPITAL_COMMUNITY)
Admission: RE | Disposition: A | Discharge status: Home Health | Payer: Self-pay | Source: Ambulatory Visit | Attending: General Surgery

## 2012-06-26 ENCOUNTER — Encounter (HOSPITAL_COMMUNITY): Payer: Self-pay | Admitting: Anesthesiology

## 2012-06-26 ENCOUNTER — Encounter (HOSPITAL_COMMUNITY): Payer: Self-pay | Admitting: *Deleted

## 2012-06-26 ENCOUNTER — Inpatient Hospital Stay (HOSPITAL_COMMUNITY)
Admission: RE | Admit: 2012-06-26 | Discharge: 2012-07-02 | DRG: 330 | Disposition: A | Discharge status: Home Health | Payer: Medicare Other | Source: Ambulatory Visit | Attending: General Surgery | Admitting: General Surgery

## 2012-06-26 ENCOUNTER — Ambulatory Visit (HOSPITAL_COMMUNITY): Payer: Medicare Other | Admitting: Anesthesiology

## 2012-06-26 DIAGNOSIS — Z885 Allergy status to narcotic agent status: Secondary | ICD-10-CM

## 2012-06-26 DIAGNOSIS — E119 Type 2 diabetes mellitus without complications: Secondary | ICD-10-CM | POA: Diagnosis present

## 2012-06-26 DIAGNOSIS — Z881 Allergy status to other antibiotic agents status: Secondary | ICD-10-CM

## 2012-06-26 DIAGNOSIS — K5720 Diverticulitis of large intestine with perforation and abscess without bleeding: Secondary | ICD-10-CM | POA: Diagnosis present

## 2012-06-26 DIAGNOSIS — K660 Peritoneal adhesions (postprocedural) (postinfection): Secondary | ICD-10-CM

## 2012-06-26 DIAGNOSIS — Z8719 Personal history of other diseases of the digestive system: Secondary | ICD-10-CM

## 2012-06-26 DIAGNOSIS — Z433 Encounter for attention to colostomy: Secondary | ICD-10-CM

## 2012-06-26 DIAGNOSIS — Z01810 Encounter for preprocedural cardiovascular examination: Secondary | ICD-10-CM

## 2012-06-26 DIAGNOSIS — M129 Arthropathy, unspecified: Secondary | ICD-10-CM | POA: Diagnosis present

## 2012-06-26 DIAGNOSIS — Z87891 Personal history of nicotine dependence: Secondary | ICD-10-CM

## 2012-06-26 DIAGNOSIS — S31109A Unspecified open wound of abdominal wall, unspecified quadrant without penetration into peritoneal cavity, initial encounter: Secondary | ICD-10-CM

## 2012-06-26 DIAGNOSIS — IMO0002 Reserved for concepts with insufficient information to code with codable children: Secondary | ICD-10-CM | POA: Diagnosis present

## 2012-06-26 DIAGNOSIS — Z7982 Long term (current) use of aspirin: Secondary | ICD-10-CM

## 2012-06-26 DIAGNOSIS — Z90710 Acquired absence of both cervix and uterus: Secondary | ICD-10-CM

## 2012-06-26 DIAGNOSIS — Z6841 Body Mass Index (BMI) 40.0 and over, adult: Secondary | ICD-10-CM

## 2012-06-26 DIAGNOSIS — R21 Rash and other nonspecific skin eruption: Secondary | ICD-10-CM | POA: Diagnosis present

## 2012-06-26 DIAGNOSIS — G4733 Obstructive sleep apnea (adult) (pediatric): Secondary | ICD-10-CM | POA: Diagnosis present

## 2012-06-26 DIAGNOSIS — Z9089 Acquired absence of other organs: Secondary | ICD-10-CM

## 2012-06-26 DIAGNOSIS — Z01812 Encounter for preprocedural laboratory examination: Secondary | ICD-10-CM

## 2012-06-26 DIAGNOSIS — I10 Essential (primary) hypertension: Secondary | ICD-10-CM | POA: Diagnosis present

## 2012-06-26 DIAGNOSIS — Z9049 Acquired absence of other specified parts of digestive tract: Secondary | ICD-10-CM

## 2012-06-26 DIAGNOSIS — Z79899 Other long term (current) drug therapy: Secondary | ICD-10-CM

## 2012-06-26 HISTORY — PX: APPLICATION OF WOUND VAC: SHX5189

## 2012-06-26 HISTORY — PX: PROCTOSCOPY: SHX2266

## 2012-06-26 HISTORY — PX: COLON RESECTION: SHX5231

## 2012-06-26 LAB — TYPE AND SCREEN

## 2012-06-26 LAB — GLUCOSE, CAPILLARY: Glucose-Capillary: 183 mg/dL — ABNORMAL HIGH (ref 70–99)

## 2012-06-26 SURGERY — COLOSTOMY REVERSAL
Anesthesia: General | Site: Rectum | Wound class: Contaminated

## 2012-06-26 MED ORDER — HEPARIN SODIUM (PORCINE) 5000 UNIT/ML IJ SOLN
5000.0000 [IU] | Freq: Once | INTRAMUSCULAR | Status: AC
  Administered 2012-06-26: 5000 [IU] via SUBCUTANEOUS
  Filled 2012-06-26: qty 1

## 2012-06-26 MED ORDER — HYDROMORPHONE HCL PF 1 MG/ML IJ SOLN
INTRAMUSCULAR | Status: AC
  Filled 2012-06-26: qty 1

## 2012-06-26 MED ORDER — ALVIMOPAN 12 MG PO CAPS
12.0000 mg | ORAL_CAPSULE | Freq: Two times a day (BID) | ORAL | Status: DC
  Administered 2012-06-27 – 2012-06-29 (×6): 12 mg via ORAL
  Filled 2012-06-26 (×9): qty 1

## 2012-06-26 MED ORDER — FENTANYL CITRATE 0.05 MG/ML IJ SOLN
INTRAMUSCULAR | Status: DC | PRN
  Administered 2012-06-26: 100 ug via INTRAVENOUS
  Administered 2012-06-26: 50 ug via INTRAVENOUS
  Administered 2012-06-26: 100 ug via INTRAVENOUS
  Administered 2012-06-26 (×5): 50 ug via INTRAVENOUS

## 2012-06-26 MED ORDER — ONDANSETRON HCL 4 MG/2ML IJ SOLN
INTRAMUSCULAR | Status: DC | PRN
  Administered 2012-06-26: 4 mg via INTRAVENOUS

## 2012-06-26 MED ORDER — ROCURONIUM BROMIDE 100 MG/10ML IV SOLN
INTRAVENOUS | Status: DC | PRN
  Administered 2012-06-26: 5 mg via INTRAVENOUS
  Administered 2012-06-26: 10 mg via INTRAVENOUS
  Administered 2012-06-26: 50 mg via INTRAVENOUS
  Administered 2012-06-26: 5 mg via INTRAVENOUS

## 2012-06-26 MED ORDER — ACETAMINOPHEN 10 MG/ML IV SOLN
INTRAVENOUS | Status: AC
  Filled 2012-06-26: qty 100

## 2012-06-26 MED ORDER — HYDROMORPHONE HCL PF 1 MG/ML IJ SOLN
0.2500 mg | INTRAMUSCULAR | Status: DC | PRN
  Administered 2012-06-26 (×3): 0.5 mg via INTRAVENOUS

## 2012-06-26 MED ORDER — ONDANSETRON HCL 4 MG/2ML IJ SOLN
4.0000 mg | Freq: Four times a day (QID) | INTRAMUSCULAR | Status: DC | PRN
  Administered 2012-06-29 – 2012-06-30 (×3): 4 mg via INTRAVENOUS
  Filled 2012-06-26 (×3): qty 2

## 2012-06-26 MED ORDER — LACTATED RINGERS IV SOLN
INTRAVENOUS | Status: DC | PRN
  Administered 2012-06-26 (×5): via INTRAVENOUS

## 2012-06-26 MED ORDER — PROMETHAZINE HCL 25 MG/ML IJ SOLN
INTRAMUSCULAR | Status: AC
  Filled 2012-06-26: qty 1

## 2012-06-26 MED ORDER — SODIUM CHLORIDE 0.9 % IJ SOLN: 9.0000 mL | INTRAMUSCULAR | Status: DC | PRN

## 2012-06-26 MED ORDER — CHLORHEXIDINE GLUCONATE 4 % EX LIQD
1.0000 "application " | Freq: Once | CUTANEOUS | Status: DC
  Filled 2012-06-26: qty 15

## 2012-06-26 MED ORDER — HEPARIN SODIUM (PORCINE) 5000 UNIT/ML IJ SOLN
5000.0000 [IU] | Freq: Three times a day (TID) | INTRAMUSCULAR | Status: DC
  Administered 2012-06-26 – 2012-07-02 (×17): 5000 [IU] via SUBCUTANEOUS
  Filled 2012-06-26 (×21): qty 1

## 2012-06-26 MED ORDER — DIPHENHYDRAMINE HCL 12.5 MG/5ML PO ELIX
12.5000 mg | ORAL_SOLUTION | Freq: Four times a day (QID) | ORAL | Status: DC | PRN
  Administered 2012-06-30: 12.5 mg via ORAL
  Filled 2012-06-26: qty 5

## 2012-06-26 MED ORDER — GLYCOPYRROLATE 0.2 MG/ML IJ SOLN
INTRAMUSCULAR | Status: DC | PRN
  Administered 2012-06-26: 0.6 mg via INTRAVENOUS

## 2012-06-26 MED ORDER — PROMETHAZINE HCL 25 MG/ML IJ SOLN
6.2500 mg | INTRAMUSCULAR | Status: DC | PRN
  Administered 2012-06-26: 6.25 mg via INTRAVENOUS

## 2012-06-26 MED ORDER — DIPHENHYDRAMINE HCL 50 MG/ML IJ SOLN
12.5000 mg | Freq: Four times a day (QID) | INTRAMUSCULAR | Status: DC | PRN
  Administered 2012-06-27 – 2012-06-30 (×6): 12.5 mg via INTRAVENOUS
  Filled 2012-06-26 (×6): qty 1

## 2012-06-26 MED ORDER — CIPROFLOXACIN IN D5W 400 MG/200ML IV SOLN
400.0000 mg | Freq: Two times a day (BID) | INTRAVENOUS | Status: AC
  Administered 2012-06-26: 400 mg via INTRAVENOUS

## 2012-06-26 MED ORDER — INSULIN ASPART 100 UNIT/ML ~~LOC~~ SOLN
0.0000 [IU] | SUBCUTANEOUS | Status: DC
  Administered 2012-06-26 – 2012-06-27 (×3): 4 [IU] via SUBCUTANEOUS
  Administered 2012-06-27 – 2012-06-30 (×7): 3 [IU] via SUBCUTANEOUS
  Administered 2012-06-30 (×2): 4 [IU] via SUBCUTANEOUS
  Administered 2012-07-01 (×3): 3 [IU] via SUBCUTANEOUS
  Administered 2012-07-01 (×2): 4 [IU] via SUBCUTANEOUS
  Administered 2012-07-02 (×2): 3 [IU] via SUBCUTANEOUS

## 2012-06-26 MED ORDER — ACETAMINOPHEN 10 MG/ML IV SOLN: 1000.0000 mg | Freq: Once | INTRAVENOUS | Status: DC | PRN

## 2012-06-26 MED ORDER — OXYCODONE HCL 5 MG/5ML PO SOLN
5.0000 mg | Freq: Once | ORAL | Status: DC | PRN
  Filled 2012-06-26: qty 5

## 2012-06-26 MED ORDER — MEPERIDINE HCL 50 MG/ML IJ SOLN: 6.2500 mg | INTRAMUSCULAR | Status: DC | PRN

## 2012-06-26 MED ORDER — ACETAMINOPHEN 10 MG/ML IV SOLN
1000.0000 mg | Freq: Four times a day (QID) | INTRAVENOUS | Status: AC
  Administered 2012-06-26 – 2012-06-27 (×4): 1000 mg via INTRAVENOUS
  Filled 2012-06-26 (×4): qty 100

## 2012-06-26 MED ORDER — METRONIDAZOLE IN NACL 5-0.79 MG/ML-% IV SOLN
INTRAVENOUS | Status: AC
  Filled 2012-06-26: qty 100

## 2012-06-26 MED ORDER — PROPOFOL 10 MG/ML IV BOLUS
INTRAVENOUS | Status: DC | PRN
  Administered 2012-06-26: 200 mg via INTRAVENOUS

## 2012-06-26 MED ORDER — POTASSIUM CHLORIDE IN NACL 20-0.45 MEQ/L-% IV SOLN
INTRAVENOUS | Status: DC
  Administered 2012-06-26 – 2012-07-01 (×11): via INTRAVENOUS
  Filled 2012-06-26 (×15): qty 1000

## 2012-06-26 MED ORDER — ALVIMOPAN 12 MG PO CAPS
12.0000 mg | ORAL_CAPSULE | Freq: Once | ORAL | Status: AC
  Administered 2012-06-26: 12 mg via ORAL
  Filled 2012-06-26: qty 1

## 2012-06-26 MED ORDER — LINEZOLID 2 MG/ML IV SOLN
600.0000 mg | Freq: Two times a day (BID) | INTRAVENOUS | Status: AC
  Administered 2012-06-26: 600 mg via INTRAVENOUS
  Filled 2012-06-26: qty 300

## 2012-06-26 MED ORDER — HYDROMORPHONE 0.3 MG/ML IV SOLN
INTRAVENOUS | Status: AC
  Administered 2012-06-27: 2.17 mg via INTRAVENOUS
  Filled 2012-06-26: qty 25

## 2012-06-26 MED ORDER — LIDOCAINE HCL (CARDIAC) 20 MG/ML IV SOLN
INTRAVENOUS | Status: DC | PRN
  Administered 2012-06-26: 100 mg via INTRAVENOUS

## 2012-06-26 MED ORDER — NALOXONE HCL 0.4 MG/ML IJ SOLN: 0.4000 mg | INTRAMUSCULAR | Status: DC | PRN

## 2012-06-26 MED ORDER — LOSARTAN POTASSIUM 50 MG PO TABS
100.0000 mg | ORAL_TABLET | Freq: Every morning | ORAL | Status: DC
  Administered 2012-06-27 – 2012-07-02 (×6): 100 mg via ORAL
  Filled 2012-06-26 (×6): qty 2

## 2012-06-26 MED ORDER — METRONIDAZOLE IN NACL 5-0.79 MG/ML-% IV SOLN
500.0000 mg | INTRAVENOUS | Status: AC
  Administered 2012-06-26: 1 g via INTRAVENOUS

## 2012-06-26 MED ORDER — CIPROFLOXACIN IN D5W 400 MG/200ML IV SOLN
INTRAVENOUS | Status: AC
  Filled 2012-06-26: qty 200

## 2012-06-26 MED ORDER — HYDROMORPHONE 0.3 MG/ML IV SOLN
INTRAVENOUS | Status: DC
  Administered 2012-06-26: 15:00:00 via INTRAVENOUS
  Administered 2012-06-27: 5.1 mg via INTRAVENOUS
  Administered 2012-06-27: 2.17 mg via INTRAVENOUS
  Administered 2012-06-27: 1.2 mg via INTRAVENOUS
  Administered 2012-06-27: 3.3 mg via INTRAVENOUS
  Administered 2012-06-27 – 2012-06-28 (×2): via INTRAVENOUS
  Administered 2012-06-28: 2.1 mg via INTRAVENOUS
  Administered 2012-06-28: 08:00:00 via INTRAVENOUS
  Administered 2012-06-28 (×2): 1.2 mg via INTRAVENOUS
  Administered 2012-06-28: 6.06 mg via INTRAVENOUS
  Administered 2012-06-29: 0.3 mg via INTRAVENOUS
  Administered 2012-06-29: 25 mg via INTRAVENOUS
  Administered 2012-06-29: 3.3 mg via INTRAVENOUS
  Administered 2012-06-29: 16:00:00 via INTRAVENOUS
  Administered 2012-06-29: 5.53 mg via INTRAVENOUS
  Administered 2012-06-29: 3 mg via INTRAVENOUS
  Administered 2012-06-30: 2.1 mg via INTRAVENOUS
  Administered 2012-06-30: 0.9 mg via INTRAVENOUS
  Administered 2012-06-30: 13:00:00 via INTRAVENOUS
  Administered 2012-06-30: 2.1 mg via INTRAVENOUS
  Administered 2012-06-30: 2.7 mg via INTRAVENOUS
  Administered 2012-06-30: 3.41 mg via INTRAVENOUS
  Administered 2012-06-30: 25 mg via INTRAVENOUS
  Administered 2012-07-01 (×2): 0.6 mg via INTRAVENOUS
  Filled 2012-06-26 (×9): qty 25

## 2012-06-26 MED ORDER — PANTOPRAZOLE SODIUM 40 MG IV SOLR
40.0000 mg | INTRAVENOUS | Status: DC
  Administered 2012-06-26 – 2012-06-30 (×5): 40 mg via INTRAVENOUS
  Filled 2012-06-26 (×6): qty 40

## 2012-06-26 MED ORDER — SUCCINYLCHOLINE CHLORIDE 20 MG/ML IJ SOLN
INTRAMUSCULAR | Status: DC | PRN
  Administered 2012-06-26: 160 mg via INTRAVENOUS

## 2012-06-26 MED ORDER — SODIUM CHLORIDE 0.9 % IR SOLN
Status: DC | PRN
  Administered 2012-06-26: 1000 mL

## 2012-06-26 MED ORDER — OXYCODONE HCL 5 MG PO TABS: 5.0000 mg | ORAL_TABLET | Freq: Once | ORAL | Status: DC | PRN

## 2012-06-26 MED ORDER — BIOTENE DRY MOUTH MT LIQD
15.0000 mL | Freq: Two times a day (BID) | OROMUCOSAL | Status: DC
  Administered 2012-06-27 – 2012-07-01 (×9): 15 mL via OROMUCOSAL

## 2012-06-26 MED ORDER — MIDAZOLAM HCL 5 MG/5ML IJ SOLN
INTRAMUSCULAR | Status: DC | PRN
  Administered 2012-06-26: 2 mg via INTRAVENOUS

## 2012-06-26 MED ORDER — CHLORHEXIDINE GLUCONATE 0.12 % MT SOLN
15.0000 mL | Freq: Two times a day (BID) | OROMUCOSAL | Status: DC
  Administered 2012-06-26 – 2012-07-01 (×9): 15 mL via OROMUCOSAL
  Filled 2012-06-26 (×14): qty 15

## 2012-06-26 MED ORDER — NEOSTIGMINE METHYLSULFATE 1 MG/ML IJ SOLN
INTRAMUSCULAR | Status: DC | PRN
  Administered 2012-06-26: 5 mg via INTRAVENOUS

## 2012-06-26 SURGICAL SUPPLY — 83 items
BLADE EXTENDED COATED 6.5IN (ELECTRODE) ×4 IMPLANT
BLADE HEX COATED 2.75 (ELECTRODE) ×4 IMPLANT
BLADE SURG 15 STRL LF DISP TIS (BLADE) ×6 IMPLANT
BLADE SURG 15 STRL SS (BLADE) ×4
BRR ADH 5X3 SEPRAFILM 6 SHT (MISCELLANEOUS) ×3
CANISTER SUCTION 2500CC (MISCELLANEOUS) ×4 IMPLANT
CHLORAPREP W/TINT 26ML (MISCELLANEOUS) ×5 IMPLANT
CLIP TI LARGE 6 (CLIP) IMPLANT
CLOTH BEACON ORANGE TIMEOUT ST (SAFETY) ×4 IMPLANT
COVER MAYO STAND STRL (DRAPES) ×4 IMPLANT
COVER SURGICAL LIGHT HANDLE (MISCELLANEOUS) ×4 IMPLANT
DECANTER SPIKE VIAL GLASS SM (MISCELLANEOUS) ×3 IMPLANT
DRAPE INCISE IOBAN 66X45 STRL (DRAPES) ×1 IMPLANT
DRAPE LAPAROSCOPIC ABDOMINAL (DRAPES) ×4 IMPLANT
DRAPE LAPAROTOMY T 102X78X121 (DRAPES) IMPLANT
DRAPE LG THREE QUARTER DISP (DRAPES) ×3 IMPLANT
DRAPE WARM FLUID 44X44 (DRAPE) ×4 IMPLANT
DRSG PAD ABDOMINAL 8X10 ST (GAUZE/BANDAGES/DRESSINGS) IMPLANT
DRSG VAC ATS LRG SENSATRAC (GAUZE/BANDAGES/DRESSINGS) ×2 IMPLANT
ELECT REM PT RETURN 9FT ADLT (ELECTROSURGICAL) ×4
ELECTRODE REM PT RTRN 9FT ADLT (ELECTROSURGICAL) ×3 IMPLANT
GAUZE SPONGE 4X4 16PLY XRAY LF (GAUZE/BANDAGES/DRESSINGS) ×3 IMPLANT
GLOVE BIO SURGEON STRL SZ7.5 (GLOVE) ×12 IMPLANT
GLOVE BIOGEL M STRL SZ7.5 (GLOVE) ×1 IMPLANT
GLOVE BIOGEL PI IND STRL 7.0 (GLOVE) ×3 IMPLANT
GLOVE BIOGEL PI INDICATOR 7.0 (GLOVE) ×1
GLOVE ECLIPSE 8.0 STRL XLNG CF (GLOVE) ×3 IMPLANT
GLOVE INDICATOR 8.0 STRL GRN (GLOVE) ×7 IMPLANT
GOWN PREVENTION PLUS LG XLONG (DISPOSABLE) ×3 IMPLANT
GOWN PREVENTION PLUS XLARGE (GOWN DISPOSABLE) ×3 IMPLANT
GOWN STRL NON-REIN LRG LVL3 (GOWN DISPOSABLE) ×6 IMPLANT
GOWN STRL REIN XL XLG (GOWN DISPOSABLE) ×12 IMPLANT
HOVERMATT HALF SINGLE USE (PATIENT TRANSFER) ×1 IMPLANT
KIT BASIN OR (CUSTOM PROCEDURE TRAY) ×4 IMPLANT
LEGGING LITHOTOMY PAIR STRL (DRAPES) ×1 IMPLANT
LIGASURE IMPACT 36 18CM CVD LR (INSTRUMENTS) ×1 IMPLANT
LUBRICANT JELLY K Y 4OZ (MISCELLANEOUS) ×2 IMPLANT
MANIFOLD NEPTUNE II (INSTRUMENTS) ×1 IMPLANT
NDL SAFETY ECLIPSE 18X1.5 (NEEDLE) IMPLANT
NEEDLE HYPO 18GX1.5 SHARP (NEEDLE)
NEEDLE HYPO 22GX1.5 SAFETY (NEEDLE) ×3 IMPLANT
NS IRRIG 1000ML POUR BTL (IV SOLUTION) ×10 IMPLANT
PACK BASIC VI WITH GOWN DISP (CUSTOM PROCEDURE TRAY) ×4 IMPLANT
PACK GENERAL/GYN (CUSTOM PROCEDURE TRAY) ×4 IMPLANT
PENCIL BUTTON HOLSTER BLD 10FT (ELECTRODE) ×4 IMPLANT
RELOAD GREEN (STAPLE) ×3 IMPLANT
SCALPEL HARMONIC ACE (MISCELLANEOUS) IMPLANT
SEPRAFILM PROCEDURAL PACK 3X5 (MISCELLANEOUS) ×1 IMPLANT
SHEARS FOC LG CVD HARMONIC 17C (MISCELLANEOUS) IMPLANT
SLEEVE SURGEON STRL (DRAPES) ×2 IMPLANT
SPONGE GAUZE 4X4 12PLY (GAUZE/BANDAGES/DRESSINGS) ×5 IMPLANT
SPONGE LAP 18X18 X RAY DECT (DISPOSABLE) ×2 IMPLANT
SPONGE SURGIFOAM ABS GEL 12-7 (HEMOSTASIS) IMPLANT
STAPLER CUT CVD 40MM GREEN (STAPLE) ×1 IMPLANT
STAPLER VISISTAT 35W (STAPLE) ×4 IMPLANT
SUCTION POOLE TIP (SUCTIONS) ×4 IMPLANT
SUT CHROMIC 2 0 SH (SUTURE) IMPLANT
SUT CHROMIC 3 0 SH 27 (SUTURE) IMPLANT
SUT NOVA 1 T20/GS 25DT (SUTURE) ×5 IMPLANT
SUT PDS AB 1 CTX 36 (SUTURE) IMPLANT
SUT PDS AB 1 TP1 96 (SUTURE) IMPLANT
SUT PDS AB 3-0 CT2 27 (SUTURE) IMPLANT
SUT PDS AB 3-0 SH 27 (SUTURE) ×4 IMPLANT
SUT PDS AB 4-0 SH 27 (SUTURE) IMPLANT
SUT PROLENE 2 0 BLUE (SUTURE) IMPLANT
SUT SILK 2 0 (SUTURE) ×8
SUT SILK 2 0 SH CR/8 (SUTURE) ×8 IMPLANT
SUT SILK 2 0SH CR/8 30 (SUTURE) IMPLANT
SUT SILK 2-0 18XBRD TIE 12 (SUTURE) ×6 IMPLANT
SUT SILK 2-0 30XBRD TIE 12 (SUTURE) IMPLANT
SUT SILK 3 0 (SUTURE) ×4
SUT SILK 3 0 SH CR/8 (SUTURE) ×7 IMPLANT
SUT SILK 3-0 18XBRD TIE 12 (SUTURE) ×6 IMPLANT
SUT VIC AB 2-0 SH 27 (SUTURE)
SUT VIC AB 2-0 SH 27X BRD (SUTURE) IMPLANT
SUT VIC AB 4-0 SH 18 (SUTURE) IMPLANT
TAPE CLOTH 4X10 WHT NS (GAUZE/BANDAGES/DRESSINGS) ×1 IMPLANT
TOWEL OR 17X26 10 PK STRL BLUE (TOWEL DISPOSABLE) ×6 IMPLANT
TOWEL OR NON WOVEN STRL DISP B (DISPOSABLE) ×1 IMPLANT
TRAY FOLEY CATH 14FRSI W/METER (CATHETERS) ×4 IMPLANT
UNDERPAD 30X30 INCONTINENT (UNDERPADS AND DIAPERS) ×1 IMPLANT
YANKAUER SUCT BULB TIP 10FT TU (MISCELLANEOUS) ×4 IMPLANT
YANKAUER SUCT BULB TIP NO VENT (SUCTIONS) ×5 IMPLANT

## 2012-06-26 NOTE — Anesthesia Postprocedure Evaluation (Signed)
Anesthesia Post Note  Patient: [name]  Procedure(s) Performed: Procedure(s) (LRB): COLOSTOMY REVERSAL (N/A) PROCTOSCOPY (N/A) LYSIS OF ADHESION () COLON RESECTION (N/A) APPLICATION OF WOUND VAC ()  Anesthesia type: General  Patient location: PACU  Post pain: Pain level controlled  Post assessment: Post-op Vital signs reviewed  Last Vitals: BP 128/53  Pulse 89  Temp 36.7 C (Oral)  Resp 18  Ht 5\' 5"  (1.651 m)  Wt 292 lb 6 oz (132.62 kg)  BMI 48.65 kg/m2  SpO2 94%  Post vital signs: Reviewed  Level of consciousness: sedated  Complications: No apparent anesthesia complications

## 2012-06-26 NOTE — Brief Op Note (Signed)
06/26/2012  2:40 PM  PATIENT:  [name]  71 y.o. female  PRE-OPERATIVE DIAGNOSIS:  h/o perforated sigmoid diverticulitis, undesired colostomy  POST-OPERATIVE DIAGNOSIS:  status post colostomy  PROCEDURE:  Procedure(s) (LRB) with comments: COLOSTOMY REVERSAL (N/A) PROCTOSCOPY (N/A) - Rigid Proctoscopy LYSIS OF ADHESION () - COLON RESECTION (N/A) APPLICATION OF WOUND VAC ()  SURGEON:  Surgeon(s) and Role:    * Atilano Ina, MD,FACS - Primary    * Lodema Pilot, DO - Assisting  PHYSICIAN ASSISTANT: none  ASSISTANTS: see above   ANESTHESIA:   general  EBL:  Total I/O In: 4000 [I.V.:4000] Out: 550 [Urine:400; Blood:150]  BLOOD ADMINISTERED:none  DRAINS: Urinary Catheter (Foley)   LOCAL MEDICATIONS USED:  NONE  SPECIMEN:  Source of Specimen:  old ostomy; sigmoid colon - stitch proximal; additional sigmoid colon; doughnuts stitch distal   DISPOSITION OF SPECIMEN:  PATHOLOGY  COUNTS:  YES  TOURNIQUET:  * No tourniquets in log *  DICTATION: .Other Dictation: Dictation Number [id number]  PLAN OF CARE: Admit to inpatient   PATIENT DISPOSITION:  PACU - hemodynamically stable.   Delay start of Pharmacological VTE agent (>24hrs) due to surgical blood loss or risk of bleeding: no  Mary Sella. Andrey Campanile, MD, FACS General, Bariatric, & Minimally Invasive Surgery [facility], [place]

## 2012-06-26 NOTE — Anesthesia Preprocedure Evaluation (Addendum)
Anesthesia Evaluation  Patient identified by MRN, date of birth, ID band Patient awake    Reviewed: Allergy & Precautions, H&P , NPO status , Patient's Chart, lab work & pertinent test results  Airway Mallampati: I TM Distance: >3 FB Neck ROM: Full    Dental  (+) Teeth Intact and Dental Advisory Given   Pulmonary shortness of breath and with exertion, sleep apnea and Continuous Positive Airway Pressure Ventilation ,  breath sounds clear to auscultation  Pulmonary exam normal       Cardiovascular hypertension, Pt. on medications Rhythm:Regular Rate:Normal     Neuro/Psych    GI/Hepatic negative GI ROS, Neg liver ROS,   Endo/Other  diabetes, Type 2, Oral Hypoglycemic AgentsMorbid obesity  Renal/GU negative Renal ROS     Musculoskeletal negative musculoskeletal ROS (+)   Abdominal (+) + obese,   Peds  Hematology negative hematology ROS (+)   Anesthesia Other Findings   Reproductive/Obstetrics                          Anesthesia Physical Anesthesia Plan  ASA: III  Anesthesia Plan: General   Post-op Pain Management:    Induction: Intravenous  Airway Management Planned: Oral ETT  Additional Equipment:   Intra-op Plan:   Post-operative Plan: Extubation in OR  Informed Consent: I have reviewed the patients History and Physical, chart, labs and discussed the procedure including the risks, benefits and alternatives for the proposed anesthesia with the patient or authorized representative who has indicated his/her understanding and acceptance.   Dental advisory given  Plan Discussed with: CRNA  Anesthesia Plan Comments:         Anesthesia Quick Evaluation

## 2012-06-26 NOTE — Progress Notes (Signed)
Pt has already placed herself on cpap at this time. She is on 15 cmH2O with 6L O2 bleed in. She has her own mask hooked up to our machine.  Jacqulynn Cadet RRT

## 2012-06-26 NOTE — H&P (Signed)
[name] is an 71 y.o. female.   Chief Complaint: here for surgery HPI: 71 year old morbidly obese Caucasian female comes in for long-term followup after undergoing an emergency Hartman's procedure on January 15 for perforated sigmoid diverticulitis. I last saw in the office on July 31. She states that she has been doing well. She denies any fever, chills, nausea, or vomiting. She denies any abdominal pain. She reports that her ostomy is functioning normally and having good daily stool output. She denies any weight loss. She states that she hasn't really been able to exercise hardly any at all due to low back pain and knee pain when she gets on her treadmill. She is eager to get her ostomy reversed.  She denies any new medical problems   Past Medical History  Diagnosis Date  . Hypertension   . Diabetes mellitus   . Arthritis   . Anemia   . Diverticulitis   . Shortness of breath     with exertion   . Sleep apnea     cpap at 10     Past Surgical History  Procedure Date  . Appendectomy   . Back surgery   . Colon resection 10/09/2011    Procedure: COLON RESECTION;  Surgeon: Atilano Ina, MD;  Location: [facility] OR;  Service: General;  Laterality: N/A;  Colon Resection with colostomy  . Colostomy   . Jackson pratt   . Application of wound vac wound vac  . Abdominal hysterectomy 1979    Family History  Problem Relation Age of Onset  . Heart disease Father   . Heart disease Brother    Social History:  reports that she quit smoking about 9 months ago. She has never used smokeless tobacco. She reports that she does not drink alcohol or use illicit drugs.  Allergies:  Allergies  Allergen Reactions  . Adhesive (Tape) Hives and Rash  . Codeine Itching  . Vancomycin Rash  . Zosyn (Piperacillin Sod-Tazobactam So) Rash    Medications Prior to Admission  Medication Sig Dispense Refill  . acetaminophen (TYLENOL) 500 MG tablet Take 500-1,500 mg by mouth every 6 (six) hours as needed. For  pain      . CALCIUM-VITAMIN D PO Take 1 tablet by mouth 2 (two) times daily. 600 d2      . estradiol (VIVELLE-DOT) 0.05 MG/24HR Place 1 patch onto the skin 2 (two) times a week.      . losartan (COZAAR) 100 MG tablet Take 100 mg by mouth every morning.       . metFORMIN (GLUCOPHAGE) 500 MG tablet Take 500 mg by mouth 2 (two) times daily before lunch and supper.       . Misc Natural Products (GLUCOSAMINE-CHONDROITIN PLUS PO) Take 2 tablets by mouth daily. Patient takes one in am and one in pm      . montelukast (SINGULAIR) 10 MG tablet Take 10 mg by mouth daily.       . peg 3350 powder (MOVIPREP) 100 G SOLR Take 1 kit (100 g total) by mouth once.  1 kit  0  . Phenylephrine HCl (VICKS SINEX NA) Place into the nose at bedtime.      . sitaGLIPtin (JANUVIA) 50 MG tablet Take 50 mg by mouth every morning.       . traMADol (ULTRAM) 50 MG tablet Take 50 mg by mouth at bedtime as needed.       Marland Kitchen aspirin 81 MG tablet Take 81 mg by mouth daily. Patient  takes 3 daily      . Chromium-Cinnamon (CINNAMON PLUS CHROMIUM PO) Take 2 capsules by mouth daily. 600mg       . furosemide (LASIX) 20 MG tablet 20 mg as needed.       Marland Kitchen HYDROcodone-acetaminophen (NORCO) 10-325 MG per tablet Take 1 tablet by mouth every 6 (six) hours as needed. For pain      . meloxicam (MOBIC) 15 MG tablet Take 7.5 mg by mouth 2 (two) times daily.         No results found for this or any previous visit (from the past 48 hour(s)). No results found.  Review of Systems  Constitutional: Negative for fever and chills.  Eyes: Negative for blurred vision and double vision.  Respiratory: Negative for shortness of breath.   Cardiovascular: Negative for chest pain, orthopnea and PND.  Gastrointestinal: Negative for nausea, vomiting and abdominal pain.  Musculoskeletal: Positive for back pain and joint pain.  Neurological: Negative for focal weakness, seizures and loss of consciousness.  Psychiatric/Behavioral: Negative for substance abuse.     Blood pressure 134/75, pulse 82, temperature 97.6 F (36.4 C), temperature source Oral, resp. rate 16, height 5\' 5"  (1.651 m), weight 292 lb 6 oz (132.62 kg), SpO2 93.00%. Physical Exam  Vitals reviewed. Constitutional: She is oriented to person, place, and time. She appears well-developed and well-nourished. No distress.       Morbidly obese  HENT:  Head: Normocephalic and atraumatic.  Right Ear: External ear normal.  Left Ear: External ear normal.  Eyes: Conjunctivae normal are normal. No scleral icterus.  Neck: Normal range of motion. Neck supple. No tracheal deviation present.  Cardiovascular: Normal rate.   Respiratory: Effort normal. No stridor. No respiratory distress. She has no wheezes.  GI: Soft. Bowel sounds are normal. There is no tenderness.         Ostomy Left abdomen; some skin irritation - like rash inferior to stoma; morbidly obese  Musculoskeletal: She exhibits no edema.  Neurological: She is alert and oriented to person, place, and time.  Skin: Skin is warm and dry. She is not diaphoretic. No erythema.  Psychiatric: She has a normal mood and affect. Her behavior is normal. Judgment and thought content normal.     Assessment/Plan Undesired colostomy s/p hartman's procedure for perforated sigmoid diverticulitis  To OR for reversal. All questions asked and answered. (see prior note) revd subcu heparin and entereg preop.  Mary Sella. Andrey Campanile, MD, FACS General, Bariatric, & Minimally Invasive Surgery [facility], [place]  [facility] M 06/26/2012, 8:51 AM

## 2012-06-26 NOTE — Anesthesia Procedure Notes (Signed)
Procedure Name: Intubation Date/Time: 06/26/2012 9:11 AM Performed by: Doran Clay Pre-anesthesia Checklist: Patient identified, Timeout performed, Emergency Drugs available, Suction available and Patient being monitored Patient Re-evaluated:Patient Re-evaluated prior to inductionOxygen Delivery Method: Circle system utilized Intubation Type: IV induction Laryngoscope Size: Mac and 4 Grade View: Grade II Tube size: 7.0 mm Number of attempts: 1 Airway Equipment and Method: Stylet Placement Confirmation: ETT inserted through vocal cords under direct vision,  breath sounds checked- equal and bilateral and positive ETCO2 Secured at: 22 cm Tube secured with: Tape Dental Injury: Teeth and Oropharynx as per pre-operative assessment

## 2012-06-26 NOTE — Transfer of Care (Signed)
Immediate Anesthesia Transfer of Care Note  Patient: [name]  Procedure(s) Performed: Procedure(s) (LRB) with comments: COLOSTOMY REVERSAL (N/A) PROCTOSCOPY (N/A) - Rigid Proctoscopy LYSIS OF ADHESION () - COLON RESECTION (N/A) APPLICATION OF WOUND VAC ()  Patient Location: PACU  Anesthesia Type: General  Level of Consciousness: sedated  Airway & Oxygen Therapy: Patient Spontanous Breathing and Patient connected to face mask oxygen  Post-op Assessment: Report given to PACU RN and Post -op Vital signs reviewed and stable  Post vital signs: Reviewed and stable  Complications: No apparent anesthesia complications

## 2012-06-27 ENCOUNTER — Encounter (HOSPITAL_COMMUNITY): Payer: Self-pay | Admitting: General Surgery

## 2012-06-27 DIAGNOSIS — Z9049 Acquired absence of other specified parts of digestive tract: Secondary | ICD-10-CM

## 2012-06-27 LAB — GLUCOSE, CAPILLARY
Glucose-Capillary: 127 mg/dL — ABNORMAL HIGH (ref 70–99)
Glucose-Capillary: 114 mg/dL — ABNORMAL HIGH (ref 70–99)

## 2012-06-27 LAB — CBC
MCH: 24.7 pg — ABNORMAL LOW (ref 26.0–34.0)
MCHC: 32.1 g/dL (ref 30.0–36.0)
MCV: 77 fL — ABNORMAL LOW (ref 78.0–100.0)
Platelets: 337 10*3/uL (ref 150–400)
RDW: 16.2 % — ABNORMAL HIGH (ref 11.5–15.5)
WBC: 18.8 10*3/uL — ABNORMAL HIGH (ref 4.0–10.5)

## 2012-06-27 LAB — HEMOGLOBIN A1C: Mean Plasma Glucose: 143 mg/dL — ABNORMAL HIGH (ref <117)

## 2012-06-27 LAB — BASIC METABOLIC PANEL
BUN: 13 mg/dL (ref 6–23)
CO2: 25 mEq/L (ref 19–32)
Calcium: 7.8 mg/dL — ABNORMAL LOW (ref 8.4–10.5)
Creatinine, Ser: 0.76 mg/dL (ref 0.50–1.10)

## 2012-06-27 MED ORDER — CHLORHEXIDINE GLUCONATE CLOTH 2 % EX PADS
6.0000 | MEDICATED_PAD | Freq: Every day | CUTANEOUS | Status: AC
  Administered 2012-06-27 – 2012-07-01 (×4): 6 via TOPICAL

## 2012-06-27 MED ORDER — MUPIROCIN 2 % EX OINT
1.0000 "application " | TOPICAL_OINTMENT | Freq: Two times a day (BID) | CUTANEOUS | Status: AC
  Administered 2012-06-27 – 2012-07-01 (×10): 1 via NASAL
  Filled 2012-06-27: qty 22

## 2012-06-27 NOTE — Clinical Documentation Improvement (Signed)
GENERIC DOCUMENTATION CLARIFICATION QUERY  THIS DOCUMENT IS NOT A PERMANENT PART OF THE MEDICAL RECORD  TO RESPOND TO THE THIS QUERY, FOLLOW THE INSTRUCTIONS BELOW:  1. If needed, update documentation for the patient's encounter via the notes activity.  2. Access this query again and click edit on the In [employer].  3. After updating, or not, click F2 to complete all highlighted (required) fields concerning your review. Select "additional documentation in the medical record" OR "no additional documentation provided".  4. Click Sign note button.  5. The deficiency will fall out of your In Basket *Please let us know if you are not able to complete this workflow by phone or e-mail (listed below).  Please update your documentation within the medical record to reflect your response to this query.                                                                                        06/27/12   Dear Dr. Andrey Campanile, [facility],  In a better effort to capture your patient's severity of illness, reflect appropriate length of stay and utilization of resources, a review of the patient medical record has revealed the following indicators.    Based on your clinical judgment, please clarify and document in a progress note and/or discharge summary the clinical condition associated with the following supporting information:  In responding to this query please exercise your independent judgment.  The fact that a query is asked, does not imply that any particular answer is desired or expected.  Based on your clinical judgment can you provide a diagnosis that represents the below listed clinical indicators?  In this pt admitted for a reversal of colonoscopy a review of the medical record reveals the following:   Pt with sleep apnea  Morbid Obesity  BMI=50.52  Treatment  CPAP  Continuous pulse Ox  Incentive spirometry  O2 therapy to keep O2 Sats>92%   Please document if the above  clinical indicators are reflective of any of the diagnoses listed below and  Document in the pn or d/c summary.  Possible Clinical Conditions?  OHS (Obesity hypoventilation syndrome) Morbid Obesity/BMI 50.52  _______Other Condition__________________ _______Cannot Clinically Determine   Supporting Information:  Risk Factors: Undesired colostomy Morbidly obese  You may use possible, probable, or suspect with inpatient documentation. possible, probable, suspected diagnoses MUST be documented at the time of discharge  Reviewed: no doc noted in d/c summary or chart .  Thank You,  Enis Slipper  RN, BSN, MSN/Inf, CCDS Clinical Documentation Specialist [facility] Pager: [phone] / E-mail: [email or web address]  Health Information Management [place]

## 2012-06-27 NOTE — Progress Notes (Signed)
1 Day Post-Op  Subjective: Pain controlled. Didn't sleep well. No n/v.  Objective: Vital signs in last 24 hours: Temp:  [97.5 F (36.4 C)-99.4 F (37.4 C)] 98.1 F (36.7 C) (10/04 0800) Pulse Rate:  [72-91] 73  (10/04 0700) Resp:  [13-22] 14  (10/04 0740) BP: (92-157)/(45-67) 92/46 mmHg (10/04 0700) SpO2:  [86 %-98 %] 97 % (10/04 0740) FiO2 (%):  [100 %] 100 % (10/03 1900) Weight:  [303 lb 9.2 oz (137.7 kg)-304 lb 3.8 oz (138 kg)] 303 lb 9.2 oz (137.7 kg) (10/04 0034)    Intake/Output from previous day: 10/03 0701 - 10/04 0700 In: 6029.2 [I.V.:5729.2; IV Piggyback:300] Out: 3125 [Urine:2975; Blood:150] Intake/Output this shift:    Alert, ox3, nad cta ant with decreased BS at bases; on cpap Reg Obese, soft, wound vac intact. hypobs +SCDs  Lab Results:   Basename 06/27/12 0315  WBC 18.8*  HGB 11.8*  HCT 36.8  PLT 337   BMET  Basename 06/27/12 0315  NA 135  K 3.9  CL 102  CO2 25  GLUCOSE 152*  BUN 13  CREATININE 0.76  CALCIUM 7.8*   PT/INR No results found for this basename: LABPROT:2,INR:2 in the last 72 hours ABG No results found for this basename: PHART:2,PCO2:2,PO2:2,HCO3:2 in the last 72 hours  Studies/Results: No results found.  Anti-infectives: Anti-infectives     Start     Dose/Rate Route Frequency Ordered Stop   06/26/12 1000   linezolid (ZYVOX) IVPB 600 mg        600 mg 300 mL/hr over 60 Minutes Intravenous Every 12 hours 06/26/12 0904 06/26/12 0942   06/26/12 0900   ciprofloxacin (CIPRO) IVPB 400 mg        400 mg 200 mL/hr over 60 Minutes Intravenous Every 12 hours 06/26/12 0904 06/26/12 0915   06/26/12 0643   metroNIDAZOLE (FLAGYL) IVPB 500 mg        500 mg 100 mL/hr over 60 Minutes Intravenous 60 min pre-op 06/26/12 0643 06/26/12 0900          Assessment/Plan: s/p Procedure(s) (LRB) with comments: COLOSTOMY REVERSAL (N/A) PROCTOSCOPY (N/A) - Rigid Proctoscopy LYSIS OF ADHESION () - COLON RESECTION (N/A) APPLICATION  OF WOUND VAC ()  OOB, IS, Pulm toilet PCA subcu heparin Ice chips, sips of water entereg cpap at night If does well this am - tx to floor  [employer]. Andrey Campanile, MD, FACS General, Bariatric, & Minimally Invasive Surgery [facility], [place]   LOS: 1 day    Atilano Ina 06/27/2012

## 2012-06-27 NOTE — Progress Notes (Signed)
CARE MANAGEMENT NOTE 06/27/2012  Patient:  [name], [name]   Account Number:  [id number]  Date Initiated:  06/27/2012  Documentation initiated by:  Gaylia Kassel  Subjective/Objective Assessment:   patient with reversal of colostomy, with hx of cardiac and bp problems,     Action/Plan:   home   Anticipated DC Date:  06/30/2012   Anticipated DC Plan:  HOME/SELF CARE  In-house referral  NA      DC Planning Services  NA      PAC Choice  NA   Choice offered to / List presented to:  NA   DME arranged  NA      DME agency  NA     HH arranged  NA      HH agency  NA   Status of service:  In process, will continue to follow Medicare Important Message given?   (If response is "NO", the following Medicare IM given date fields will be blank) Date Medicare IM given:   Date Additional Medicare IM given:    Discharge Disposition:    Per UR Regulation:  Reviewed for med. necessity/level of care/duration of stay  If discussed at Long Length of Stay Meetings, dates discussed:    Comments:  [id number]/Melvine Julin Earlene Plater, RN, BSN, CCM: CHART REVIEWED AND UPDATED. NO DISCHARGE NEEDS PRESENT AT THIS TIME. CASE MANAGEMENT [phone]

## 2012-06-27 NOTE — Progress Notes (Signed)
Rec'd patient from ICU. No concerns at transfer. Moved to a bari bed for comfort. Wound vac intact not due to be changed until Saturday per ICU RN. Will begin M, W, F rotation next week.

## 2012-06-27 NOTE — Progress Notes (Signed)
Pt has already placed herself on cpap at this time. She is on 15 cmH2O with 6L O2 bleed in. Patient's home tubing and mask, our machine. RT will continue to monitor.

## 2012-06-28 LAB — BASIC METABOLIC PANEL
Calcium: 7.9 mg/dL — ABNORMAL LOW (ref 8.4–10.5)
Chloride: 99 mEq/L (ref 96–112)
GFR calc Af Amer: >90 mL/min (ref >90)
GFR calc non Af Amer: 87 mL/min — ABNORMAL LOW (ref >90)

## 2012-06-28 LAB — GLUCOSE, CAPILLARY
Glucose-Capillary: 107 mg/dL — ABNORMAL HIGH (ref 70–99)
Glucose-Capillary: 110 mg/dL — ABNORMAL HIGH (ref 70–99)
Glucose-Capillary: 112 mg/dL — ABNORMAL HIGH (ref 70–99)
Glucose-Capillary: 118 mg/dL — ABNORMAL HIGH (ref 70–99)

## 2012-06-28 LAB — CBC WITH DIFFERENTIAL/PLATELET
Eosinophils Absolute: 0.9 10*3/uL — ABNORMAL HIGH (ref 0.0–0.7)
Hemoglobin: 10.8 g/dL — ABNORMAL LOW (ref 12.0–15.0)
Lymphocytes Relative: 12 % (ref 12–46)
Lymphs Abs: 2.1 10*3/uL (ref 0.7–4.0)
Monocytes Relative: 7 % (ref 3–12)
Neutro Abs: 13.8 10*3/uL — ABNORMAL HIGH (ref 1.7–7.7)
Neutrophils Relative %: 76 % (ref 43–77)
Platelets: 305 10*3/uL (ref 150–400)
RBC: 4.38 MIL/uL (ref 3.87–5.11)
WBC: 18.1 10*3/uL — ABNORMAL HIGH (ref 4.0–10.5)

## 2012-06-28 NOTE — Progress Notes (Signed)
Spoke with Dr Gerrit Friends advised him of patient refusal to have foley removed Dr Gerrit Friends stated to document in progress notes of patient refusal. Patient has poor mobility. Will try tomorrow to dc foley. Wound vac to be changed on Monday per Dr Gerrit Friends (see notes), clear liquids, and OOB to ambulate to chair and hall. Lurena Joiner, RN

## 2012-06-28 NOTE — Progress Notes (Signed)
Tried to DC foley patient declined stated she is not able at this time to get up and down to go to the bathroom will notify DR. Patient will not allow to remove foley at this time. Advised patient of risks of not removing and patient stated ok. Lurena Joiner, RN

## 2012-06-28 NOTE — Op Note (Signed)
[name], [name] NO.:  [id number]  MEDICAL RECORD NO.:  [id number]  LOCATION:  1532                         FACILITY:  [facility]  PHYSICIAN:  Mary Sella. Andrey Campanile, MD, FACSDATE OF BIRTH:  1941/08/14  DATE OF PROCEDURE:  06/26/2012 DATE OF DISCHARGE:                              OPERATIVE REPORT   PREOPERATIVE DIAGNOSES: 1. History of perforated sigmoid diverticulitis. 2. Undesired colostomy.  POSTOPERATIVE DIAGNOSES: 1. History of perforated sigmoid diverticulitis. 2. Undesired colostomy. 3. Small bowel adhesive disease.  PROCEDURE: 1. Lysis of adhesions for 80 minutes. 2. Sigmoid colon resection with colostomy reversal with a side-end anastomosis. 3. Rigid proctoscopy. 4. Application of wound VAC (Greater than 10 square cm).  SURGEON:  Mary Sella. Andrey Campanile, MD, FACS.  ASSISTANT SURGEON:  Lodema Pilot, MD  ANESTHESIA:  General.  EBL:  Less than 200.  SPECIMENS: 1. Old colostomy. 2. Sigmoid colon stitch marks proximal. 3. Additional sigmoid colon. 4. Doughnuts, stitch marks distal doughnut.  FINDINGS:  The patient had a fair amount of small bowel adhered to the midline abdominal incision.  She also had peristomal small hernia.  She also had a fair amount of intra-abdominal small bowel adhesions.  There was no leak on rigid proctoscopy bubble test.  The patient is morbidly obese, and because of the extensive nature of her adhesive disease as well as her morbid obesity, it added approximately 2 hours to the length of the typical procedure time, therefore the 22 modifier should be applied.  INDICATIONS FOR PROCEDURE:  The patient is a morbidly obese Caucasian female who had presented back in January with perforated sigmoid diverticulitis.  She underwent an emergent partial sigmoid colectomy and end-colostomy, and placement of a wound VAC on October 09, 2011.  She recovered uneventfully.  I had been following her throughout the spring, summer, and early  fall.  During this time, I had been counseling the patient on weight loss.  We had actually referred the patient to the nutritionist for nutrition counseling, but that was not too successful. Despite numerous counseling sessions, the patient did not lose any weight.  We discussed ostomy reversal for which the patient requested. On 2 separate occasions, I had extensive discussions with the patient regarding her increased risk for perioperative and postoperative complications if proceeding to the operating room, with her current body weight.  I explained that she was at increased risk for surgical site wound infection, incisional hernia, as well as anastomotic problems given her current body weight.  She stated that she understood the risks and desired surgical reversal of her colostomy.  Prior to going to the operating room, she did have a barium enema through her rectum as well as through her ostomy, which revealed a long rectal stump.  She did have some remaining diverticular disease in her remaining sigmoid colon.  I had made plans remove that at the time of surgery as to decrease her chance of having recurrent diverticulitis.  We discussed at length the risks and benefits of surgery preoperatively on 2 separate occasions, including but not limited to, bleeding, infection, injury to surrounding structures, anastomotic stricture, anastomotic leak, possible need for ostomy, incisional hernia formation, injury  to surrounding structures, blood clot formation, prolonged ileus, pneumonia, as well as cardiac complications.  We also discussed increased risk for hernia formation both in the midline as well as in the ostomy site.  I explained there is a high degree probability that we would place another wound VAC in her subcutaneous tissue.  We also talked about typical postoperative course. We also talked about the remote possibility of death.  She elected to proceed with surgery.    DESCRIPTION OF PROCEDURE: Prior to undergoing surgery, the patient was given a bowel prep the day before.  She was also given Entereg preoperatively as well as a subcutaneous dose of heparin.  She was taken to operating room 6 at [facility], placed supine on the operating table.  Sequential compression devices were placed.  General endotracheal anesthesia was established.  She was then placed in lithotomy position with appropriate padding.  The old ostomy stoma device was removed.  I placed pursestring suture to close the ostomy with 0-silk.  A surgical time-out had been performed.  She received antibiotics prior to skin incision.  Her abdomen was prepped and draped in usual standard surgical fashion with ChloraPrep.  The ostomy was prepped with Betadine as well as with her perineum and buttock area. She had a large hypertrophic scar in the midline.  I did an elliptical incision with a knife excising the old scar.  The subcutaneous tissue was divided with electrocautery.  It should be noted that in the subcutaneous tissue in the lower midline, there was some serous fluid in the subcutaneous tissue.  There was no organized pocket of fluid.  It was just for lack of better description edematous.  I got down to what appeared to be thick scar and fascia.  I elected to extend the incision about an inch or 2 above the level of the umbilicus to gain entry to her abdomen where I had not made previous incision.  The peritoneal cavity was entered.  There was gross evidence of small bowel adhered to the midline.  We then carefully entered the abdomen over the next 30 minutes taking down small bowel from the midline using a pair of Metzenbaum scissors.  I was eventually able to open the entire midline.  We then took down the small bowel and omentum from the left side of the abdomen as well as from the right side of the abdomen.  There was more scar tissue towards the left as well as in  the midline.  The omentum was adhered to the small bowel.  We then freed this up sharply with Metzenbaum scissors.  We continued to lyse adhesions for another 30 minutes.  Once I was able to free that, I placed a Bookwalter retractor for retraction.  We identified the transected portion of the colon with 2 Prolene sutures.  There was a loop of small bowel that had adhered to the area.  It was carefully freed with a pair of Metzenbaum scissors.  At this point, I began to take down the ostomy.  A circular incision was made around the stoma site with electrocautery.  I then freed the bowel from the surrounding subcutaneous tissue.  It had formed hernia sac.  We eventually were able to bring in the proximal transected end of the colon back into the abdominal cavity.  As stated, there was a small parastomal hernia.  It was separated from the surrounding hernia sac.  At this point, I began to pack small bowel up  into the right upper quadrant, however, we did find some additional intra-loop small bowel adhesions.  I ended up lysing adhesions for another 20 minutes.  At this point, we had been operating for approximately 2 to 2-1/2 hours.  The distal colon was freed from the lateral pelvic sidewall.  I did see some additional diverticular  in the remaining distal sigmoid colon.  I felt the safest course of action was to transect this remaining portion of sigmoid colon.  I found an area of approximately 4-5 inches downstream in the upper proximal rectum.  Prior to doing this, I took down some of the remaining mesentery posterior to this.  The remaining part of sigmoid colon mesentery was taken down  in a serial fashion with LigaSure device staying close to the bowel wall.  We were not near the ureter.  At this point, I had freed up the remaining sigmoid colon and I transected it with a contour stapler with a green load.  The Prolene suture marked the proximal end.  The remaining upper rectum came  up to about the level of the sacral promontory.  There appeared to be adequate blood supply to the staple line.  At this point, I went below and passed a 25 mm Sizer up the patient's anus and rectum and advanced it.  I was able to pass it all the way up to the staple line.  I then passed a 28 mm dilator up easily.  It felt like a 29 mm stapler would be accommodated.  At this point, we opened a 29 mm Ethicon stapler.  I elected to do a side-to-end anastomosis.  The anvil was removed from the stapler and the green spike was placed within the anvil.  The proximal colon with the stoma was opened with electrocautery.  A small slit was made in it so that I could pass the anvil with the spike in from the old ostomy site and bring it out through the side slightly upstream. This was done on the anti-mesenteric side.  At this point, the distal portion of that proximal colon was transected and stapled off with a contour stapler.  At this point, I went below and advanced the EEA stapler up through the patient's anus and rectum.  I was able to advance the stapler pretty much all the way up to the distal staple line, however, I could not get it to go completely to the end of the staple line.  There was probably about 1 cm between the staple line and the end of the stapler.  We repositioned the staple several times, but could not get it to the end of the staple line.  Therefore I scrubbed back in and we ended up taking an additional 2 cm off the upper rectum with another fire of the contour stapler.  I went back below and now could easily reach the end of the staple line with EEA stapler.  The stapler was opened and the spike was advanced out through the staple line slightly posterior to the staple line.  There was a little bit of enlargement of the enterotomy around the defect made by the spike of the stapler.  Dr. Biagio Quint reapproximated the enlargement with 2 interrupted 2-0 silk sutures.  At this point,  he placed the anvil onto the spike ensuring that the proximal bowel was not twisted.  I then started to close the stapler.  He ensured there was nothing between the two ends of the bowel.  The stapler was brought together and fired.  It was then opened and removed from the rectum. There were 2 intact donuts, which were sent as specimens as well.  At this point, rigid proctoscopy was performed.  I placed the rigid proctoscope up the anus into the rectum under direct visualization.  The anastomosis was at least at around 18 cm.  It was insufflated and Dr. Biagio Quint had placed saline in the pelvis.  The anastomosis was well distended and there was no bubbles.  The rigid proctoscope was removed and I scrubbed back in.  We placed tension relieving sutures along the anterior staple line using 2-0 silks in a Lembert fashion.  At this point, the pelvis was irrigated copiously with saline.  I then closed the fascia in an interrupted manner at the old ostomy site.  the left abdomen was closed in an interrupted fashion with multiple 0- Novafil sutures.  At this point, the remaining abdomen was irrigated. Seprafilm was placed on top of the omentum in the midline.  I then reapproximated the thick fascia and scar with multiple interrupted 0-Novafils.  Because there was a fair amount of edematous tissue in the subcutaneous tissue, I elected to place a wound VAC.  The black sponge was cut to fit the midline incision as well as the old ostomy site.  Plastic drape was placed on the skin and I created a bridge with black sponge connecting the 2 sites.  The wound VAC was completely placed and hooked up to suction.  There was adequate seal.  The patient was taken to the recovery room.  All instrument sponge counts were correct x2.  There were no immediate complications.  All in all, the procedure took approximately an extra 2 hours because of her obesity and prior scar tissue.  She was extubated in recovery  room.     Mary Sella. Andrey Campanile, MD, FACS     EMW/MEDQ  D:  06/27/2012  T:  06/28/2012  Job:  [id number]  cc:   Corrie Mckusick, M.D. Fax: [phone]

## 2012-06-28 NOTE — Progress Notes (Signed)
Patient sat in chair for 2-3 hours refused to ambulate. Lurena Joiner, RN

## 2012-06-28 NOTE — Progress Notes (Signed)
Patient ID: [name], female   DOB: 27-Dec-1940, 71 y.o.   MRN: [id number]  General Surgery - [facility] - Progress Note  POD# 2  Subjective: Patient status post colostomy closure with relatively extensive lysis of adhesions.  Mobility an issue due to morbid obesity.  Mild pain.  No nausea.  Objective: Vital signs in last 24 hours: Temp:  [97.7 F (36.5 C)-99.2 F (37.3 C)] 98 F (36.7 C) (10/05 0420) Pulse Rate:  [76-83] 80  (10/05 0420) Resp:  [16-21] 16  (10/05 0811) BP: (110-139)/(42-57) 134/49 mmHg (10/05 0420) SpO2:  [88 %-98 %] 94 % (10/05 0811) Last BM Date:  (no BM postop/ lbm with colostomy 10/2)  Intake/Output from previous day: 10/04 0701 - 10/05 0700 In: 2325 [I.V.:2225; IV Piggyback:100] Out: 1725 [Urine:1625; Drains:100]  Exam: HEENT - clear, not icteric Neck - soft Chest - clear bilaterally Cor - RRR, no murmur Abd - obese, VAC dressing intact, mild tenderness; few BS Ext - no significant edema Neuro - grossly intact, no focal deficits  Lab Results:   Basename 06/28/12 0441 06/27/12 0315  WBC 18.1* 18.8*  HGB 10.8* 11.8*  HCT 34.2* 36.8  PLT 305 337     Basename 06/28/12 0441 06/27/12 0315  NA 132* 135  K 3.9 3.9  CL 99 102  CO2 25 25  GLUCOSE 118* 152*  BUN 9 13  CREATININE 0.65 0.76  CALCIUM 7.9* 7.8*    Studies/Results: No results found.  Assessment / Plan: 1.  Status post colostomy closure  - allow clear liquids  - OOB, ambulate  - VAC dressing to be changed on Monday  Velora Heckler, MD, [facility] Office: [phone]  06/28/2012

## 2012-06-29 LAB — GLUCOSE, CAPILLARY
Glucose-Capillary: 103 mg/dL — ABNORMAL HIGH (ref 70–99)
Glucose-Capillary: 113 mg/dL — ABNORMAL HIGH (ref 70–99)
Glucose-Capillary: 113 mg/dL — ABNORMAL HIGH (ref 70–99)

## 2012-06-29 NOTE — Progress Notes (Signed)
Patient refused removal of foley today. Patient sat in chair for 3 hours. Patient becoming distended needs to ambulate more encouraged her to ambulate stated she just cant right now. Lurena Joiner, RN

## 2012-06-29 NOTE — Progress Notes (Deleted)
Patient on cpap 12cm with 7lpm bleed in. Sats feep falling with patient sleeps. Flow increased from 4lpm to 7lpm

## 2012-06-29 NOTE — Progress Notes (Signed)
RN reported o2 sats falling while pt. On cpap 12cm, 4lpm o2 bleed in. O2 flow increased to 7lpm and probe changed. O2 sats fluctuating between 91-95%. Will continue to monitor

## 2012-06-29 NOTE — Progress Notes (Signed)
Patient ID: [name], female   DOB: 1941/07/05, 71 y.o.   MRN: [id number]  General Surgery - [facility] - Progress Note  POD# 3  Subjective: Patient states passing flatus, having cramps.  Sat in chair yesterday.  Refused foley removal until more mobile.  Some nausea yesterday - wants to stay on clear liquid diet for now.  Objective: Vital signs in last 24 hours: Temp:  [98 F (36.7 C)-99.3 F (37.4 C)] 98 F (36.7 C) (10/06 0550) Pulse Rate:  [76-81] 80  (10/06 0550) Resp:  [16-18] 18  (10/06 0550) BP: (122-149)/(68-74) 127/74 mmHg (10/06 0550) SpO2:  [91 %-97 %] 97 % (10/06 0550) Last BM Date:  (no BM postop/ lbm with colostomy 10/2)  Intake/Output from previous day: 10/05 0701 - 10/06 0700 In: 2510 [P.O.:480; I.V.:2030] Out: 2435 [Urine:2435]  Exam: HEENT - clear, not icteric Chest - clear bilaterally Cor - RRR Abd - obese, VAC dressing intact; BS present; mild tenderness Ext - no significant edema Neuro - grossly intact, no focal deficits  Lab Results:   [facility] 06/28/12 0441 06/27/12 0315  WBC 18.1* 18.8*  HGB 10.8* 11.8*  HCT 34.2* 36.8  PLT 305 337     Basename 06/28/12 0441 06/27/12 0315  NA 132* 135  K 3.9 3.9  CL 99 102  CO2 25 25  GLUCOSE 118* 152*  BUN 9 13  CREATININE 0.65 0.76  CALCIUM 7.9* 7.8*    Studies/Results: No results found.  Assessment / Plan: 1.  Status post colostomy closure  - VAC dressing intact - plan to change on Monday  - continue clear liquid diet  - encourage OOB, ambulation  - foley out when patient agrees and mobility improves  Velora Heckler, MD, [facility] Office: [phone]  06/29/2012

## 2012-06-30 LAB — GLUCOSE, CAPILLARY
Glucose-Capillary: 120 mg/dL — ABNORMAL HIGH (ref 70–99)
Glucose-Capillary: 156 mg/dL — ABNORMAL HIGH (ref 70–99)
Glucose-Capillary: 101 mg/dL — ABNORMAL HIGH (ref 70–99)
Glucose-Capillary: 159 mg/dL — ABNORMAL HIGH (ref 70–99)

## 2012-06-30 LAB — CBC WITH DIFFERENTIAL/PLATELET
Basophils Relative: 0 % (ref 0–1)
HCT: 35.7 % — ABNORMAL LOW (ref 36.0–46.0)
Hemoglobin: 11.3 g/dL — ABNORMAL LOW (ref 12.0–15.0)
Lymphocytes Relative: 15 % (ref 12–46)
Lymphs Abs: 1.6 10*3/uL (ref 0.7–4.0)
MCHC: 31.7 g/dL (ref 30.0–36.0)
Monocytes Absolute: 1 10*3/uL (ref 0.1–1.0)
Monocytes Relative: 9 % (ref 3–12)
Neutro Abs: 7.6 10*3/uL (ref 1.7–7.7)
Neutrophils Relative %: 72 % (ref 43–77)
RBC: 4.58 MIL/uL (ref 3.87–5.11)
WBC: 10.5 10*3/uL (ref 4.0–10.5)

## 2012-06-30 LAB — BASIC METABOLIC PANEL
CO2: 27 mEq/L (ref 19–32)
Chloride: 96 mEq/L (ref 96–112)
Creatinine, Ser: 0.61 mg/dL (ref 0.50–1.10)
GFR calc Af Amer: >90 mL/min (ref >90)
Potassium: 3.8 mEq/L (ref 3.5–5.1)

## 2012-06-30 LAB — MAGNESIUM: Magnesium: 1.9 mg/dL (ref 1.5–2.5)

## 2012-06-30 MED ORDER — GLUCERNA SHAKE PO LIQD
237.0000 mL | Freq: Three times a day (TID) | ORAL | Status: DC
  Administered 2012-06-30 – 2012-07-01 (×3): 237 mL via ORAL
  Filled 2012-06-30 (×9): qty 237

## 2012-06-30 MED ORDER — KETOROLAC TROMETHAMINE 15 MG/ML IJ SOLN
15.0000 mg | Freq: Four times a day (QID) | INTRAMUSCULAR | Status: AC
  Administered 2012-06-30 – 2012-07-02 (×8): 15 mg via INTRAVENOUS
  Filled 2012-06-30 (×10): qty 1

## 2012-06-30 MED ORDER — OXYCODONE-ACETAMINOPHEN 5-325 MG PO TABS
1.0000 | ORAL_TABLET | ORAL | Status: DC | PRN
  Administered 2012-06-30 – 2012-07-02 (×7): 2 via ORAL
  Filled 2012-06-30 (×7): qty 2

## 2012-06-30 MED ORDER — GI COCKTAIL ~~LOC~~
30.0000 mL | Freq: Once | ORAL | Status: AC
  Administered 2012-06-30: 30 mL via ORAL
  Filled 2012-06-30: qty 30

## 2012-06-30 NOTE — Progress Notes (Signed)
Pharmacy Brief Note - Alvimopan (Entereg)  The standing order set for alvimopan (Entereg) now includes an automatic order to discontinue the drug after the patient has had a bowel movement.  The change was approved by the Pharmacy & Therapeutics Committee and the Medical Executive Committee.    This patient has had a bowel movement documented by nursing.  Therefore, alvimopan has been discontinued.  If there are questions, please contact the pharmacy at [phone].  Thank you-  Elie Goody, PharmD, BCPS Pager: [phone] 06/30/2012  6:55 AM

## 2012-06-30 NOTE — Evaluation (Signed)
Occupational Therapy Evaluation Patient Details Name: [name] MRN: [id number] DOB: [birth year] Today's Date: 06/30/2012 Time: 1230-1300 OT Time Calculation (min): 30 min  OT Assessment / Plan / Recommendation Clinical Impression  Pt admitted for ostomy reversal and now with debilitation.  Pt was in SNF until April and since has been at home with CNA care 9 hours a day.  Pt would benefit from cont OT to increase I with basic adls so pt can be more I at home.      OT Assessment  Patient needs continued OT Services    Follow Up Recommendations  Home health OT    Barriers to Discharge None Pt has CNA assist at home after d/c.  Equipment Recommendations  None recommended by OT    Recommendations for Other Services    Frequency  Min 2X/week    Precautions / Restrictions Precautions Precautions: Fall Precaution Comments: Pt also with wound vac to abdomen.   Restrictions Weight Bearing Restrictions: No   Pertinent Vitals/Pain Pt with 4/10 pain.  Pt with PCA    ADL  Eating/Feeding: Performed;Independent Where Assessed - Eating/Feeding: Edge of bed Grooming: Performed;Wash/dry face;Brushing hair;Supervision/safety Where Assessed - Grooming: Unsupported sitting Upper Body Bathing: Simulated;Set up Where Assessed - Upper Body Bathing: Unsupported sitting Lower Body Bathing: Simulated;Maximal assistance Where Assessed - Lower Body Bathing: Supported sit to stand Upper Body Dressing: Simulated;Minimal assistance Where Assessed - Upper Body Dressing: Unsupported sitting Lower Body Dressing: Simulated;Maximal assistance Where Assessed - Lower Body Dressing: Supported sit to stand Toilet Transfer: Performed;Minimal assistance Toilet Transfer Method: Surveyor, minerals: Materials engineer and Hygiene: Simulated;Minimal assistance Where Assessed - Engineer, mining and Hygiene: Standing Transfers/Ambulation Related to  ADLs: Pt min assist. Took 3 steps to chair and 3 steps back to bed as pt wanted to nap in bed after therapy. ADL Comments: Pt needs greater assist with LE adls than UE adls due to abdomen.    OT Diagnosis: Generalized weakness;Acute pain  OT Problem List: Impaired balance (sitting and/or standing);Decreased knowledge of use of DME or AE;Cardiopulmonary status limiting activity;Obesity;Pain OT Treatment Interventions: Self-care/ADL training;Energy conservation;DME and/or AE instruction;Therapeutic activities   OT Goals Acute Rehab OT Goals OT Goal Formulation: With patient Time For Goal Achievement: 07/14/12 Potential to Achieve Goals: Good ADL Goals Pt Will Perform Grooming: with supervision;Standing at sink ADL Goal: Grooming - Progress: Goal set today Pt Will Perform Lower Body Bathing: with min assist;Sit to stand from chair;Standing at sink;Sitting at sink;with adaptive equipment ADL Goal: Lower Body Bathing - Progress: Goal set today Pt Will Perform Lower Body Dressing: with min assist;Sit to stand from chair;with adaptive equipment ADL Goal: Lower Body Dressing - Progress: Goal set today Pt Will Perform Tub/Shower Transfer: Tub transfer;Transfer tub bench;with min assist ADL Goal: Tub/Shower Transfer - Progress: Goal set today Additional ADL Goal #1: Pt will comlete all aspects of toileting on 3:1 over commode with S. ADL Goal: Additional Goal #1 - Progress: Goal set today  Visit Information  Last OT Received On: 06/30/12 Assistance Needed: +1    Subjective Data  Subjective: "I might go home on Wed." Patient Stated Goal: to be more independent.   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.) Type of Home: House Home Access: Stairs to enter [employer] of Steps: 4 Entrance Stairs-Rails: Right;Left;Can reach both Home Layout: Two level;Able to  live on main level with bedroom/bathroom Bathroom Shower/Tub: Tub/shower unit;Curtain Bathroom Toilet: Standard Home Adaptive Equipment: Bedside commode/3-in-1;Tub transfer bench Prior Function Level of Independence: Needs assistance Needs Assistance: Bathing;Dressing;Meal Prep;Light Housekeeping Bath: Moderate Dressing: Moderate Meal Prep: Total Light Housekeeping: Total Able to Take Stairs?: Yes Driving: No Vocation: Retired Musician: No difficulties Dominant Hand: Right         Vision/Perception Vision - Assessment Vision Assessment: Vision not tested   Cognition  Overall Cognitive Status: Appears within functional limits for tasks assessed/performed Arousal/Alertness: Awake/alert Orientation Level: Appears intact for tasks assessed Behavior During Session: [facility] for tasks performed Cognition - Other Comments: intact.    Extremity/Trunk Assessment Right Upper Extremity Assessment RUE ROM/Strength/Tone: Within functional levels RUE Sensation: WFL - Light Touch RUE Coordination: WFL - gross/fine motor Left Upper Extremity Assessment LUE ROM/Strength/Tone: Within functional levels LUE Sensation: WFL - Light Touch Trunk Assessment Trunk Assessment: Normal     Mobility Bed Mobility Bed Mobility: Rolling Left;Left Sidelying to Sit;Scooting to HOB;Sit to Supine Rolling Left: 4: Min assist;With rail Left Sidelying to Sit: 3: Mod assist;With rails;HOB elevated;Other (comment) (HOB at 30) Sit to Supine: 4: Min assist;HOB flat Scooting to [facility]: 5: Supervision;With rail Details for Bed Mobility Assistance: Pt has hard time coming supine to sit.  Pt requesting bariatric hospital bed. Transfers Transfers: Sit to Stand;Stand to Sit Sit to Stand: 4: Min guard;From bed Stand to Sit: 4: Min guard;To bed Details for Transfer Assistance: Min guard just for safety since first time up.       Shoulder Instructions     Exercise     Balance     End of Session  OT - End of Session Activity Tolerance: Patient tolerated treatment well Patient left: in bed;with call bell/phone within reach Nurse Communication: Mobility status  GO     Hope Budds 06/30/2012, 1:17 PM [phone]

## 2012-06-30 NOTE — Progress Notes (Signed)
4 Days Post-Op  Subjective: Pt has refused her foley to be removed to date. Sat in chair for 2.5 hrs yesterday. Has NOT ambulated in halls since surgery. +flatus. +bm. C/o soreness. No belching.   Objective: Vital signs in last 24 hours: Temp:  [97.8 F (36.6 C)-98.7 F (37.1 C)] 98.7 F (37.1 C) (10/07 0450) Pulse Rate:  [78-80] 79  (10/07 0450) Resp:  [16-18] 16  (10/07 0450) BP: (120-140)/(70-84) 140/78 mmHg (10/07 0450) SpO2:  [93 %-96 %] 96 % (10/07 0450) Last BM Date: 06/29/12  Intake/Output from previous day: 10/06 0701 - 10/07 0700 In: 2298.8 [P.O.:480; I.V.:1818.8] Out: 975 [Urine:975] Intake/Output this shift:    Alert, nad cta ant Reg Obese, soft, wound vac intact, +BS +SCDs  Lab Results:   [facility] 06/28/12 0441  WBC 18.1*  HGB 10.8*  HCT 34.2*  PLT 305   BMET  Basename 06/28/12 0441  NA 132*  K 3.9  CL 99  CO2 25  GLUCOSE 118*  BUN 9  CREATININE 0.65  CALCIUM 7.9*   PT/INR No results found for this basename: LABPROT:2,INR:2 in the last 72 hours ABG No results found for this basename: PHART:2,PCO2:2,PO2:2,HCO3:2 in the last 72 hours  Studies/Results: No results found.  Anti-infectives: Anti-infectives     Start     Dose/Rate Route Frequency Ordered Stop   06/26/12 1000   linezolid (ZYVOX) IVPB 600 mg        600 mg 300 mL/hr over 60 Minutes Intravenous Every 12 hours 06/26/12 0904 06/26/12 0942   06/26/12 0900   ciprofloxacin (CIPRO) IVPB 400 mg        400 mg 200 mL/hr over 60 Minutes Intravenous Every 12 hours 06/26/12 0904 06/26/12 0915   06/26/12 0643   metroNIDAZOLE (FLAGYL) IVPB 500 mg        500 mg 100 mL/hr over 60 Minutes Intravenous 60 min pre-op 06/26/12 0643 06/26/12 0900          Assessment/Plan: s/p Procedure(s) (LRB) with comments: COLOSTOMY REVERSAL (N/A) PROCTOSCOPY (N/A) - Rigid Proctoscopy LYSIS OF ADHESION () - COLON RESECTION (N/A) APPLICATION OF WOUND VAC ()  Clears with glucerna shakes today,  if nausea improves - full liquids PT/OT Stressed the importance of ambulating.  Told the pt that the foley was coming out today Will add toradol Glucose - ok GI cocktail Wound vac change later today  Mary Sella. Andrey Campanile, MD, FACS General, Bariatric, & Minimally Invasive Surgery [facility], [place]   LOS: 4 days    [name] 06/30/2012

## 2012-06-30 NOTE — Care Management Note (Signed)
    Page 1 of 2   07/01/2012     11:35:57 AM   CARE MANAGEMENT NOTE 07/01/2012  Patient:  [name], [name]   Account Number:  [id number]  Date Initiated:  06/27/2012  Documentation initiated by:  DAVIS,RHONDA  Subjective/Objective Assessment:   patient with reversal of colostomy, with hx of cardiac and bp problems,     Action/Plan:   home   Anticipated DC Date:  07/02/2012   Anticipated DC Plan:  HOME W HOME HEALTH SERVICES  In-house referral  NA      DC Planning Services  CM consult      PAC Choice  DURABLE MEDICAL EQUIPMENT  HOME HEALTH   Choice offered to / List presented to:  C-1 Patient   DME arranged  HOSPITAL BED  3-N-1  WHEELCHAIR - MANUAL  VAC      DME agency  [facility]  [facility]     HH arranged  HH-2 PT  HH-3 OT  HH-1 RN      [facility] agency  [facility]   Status of service:  Completed, signed off Medicare Important Message given?   (If response is "NO", the following Medicare IM given date fields will be blank) Date Medicare IM given:   Date Additional Medicare IM given:    Discharge Disposition:  HOME W HOME HEALTH SERVICES  Per UR Regulation:  Reviewed for med. necessity/level of care/duration of stay  If discussed at Long Length of Stay Meetings, dates discussed:    Comments:  07-01-12 Lorenda Ishihara RN CM 1134 [employer] approval complete. Awaiting approval for DME, [facility] following.  06-30-12 Lorenda Ishihara RN CM 1500 Spoke with patient at bedside, discussed referral for wound therapy. Patient has [name] for aide currently and states she has been told by them that they refer to [facility] for skilled services where she lives. Contacted [facility] and they are unable to provide skilled services but still can continue with aide. Contacted [facility] and they will provide skilled services and DME. Patient is agreeable to the plan. Faxed [employer] Auth form today, will await approval.  [id number] Earlene Plater, RN, BSN, CCM: CHART REVIEWED AND UPDATED. NO DISCHARGE  NEEDS PRESENT AT THIS TIME. CASE MANAGEMENT [phone]

## 2012-06-30 NOTE — Consult Note (Signed)
WOC consult Note Reason for Consult:See per Dr. Tawana Scale request to join him at bedside for [facility] dressing change Wound type:Surgical Pressure Ulcer POA: No Measurement:24cm x 8cm x 6cm (surgical incision). 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 Wound ZOX:WRUE, moist, clean Drainage (amount, consistency, odor) moderate amount serosanguinous from both Periwound:intact, clear Dressing procedure/placement/frequency: 6 pieces of black foam used to fill takedown site, incision and bridge between them.  tolerated well after seal achieved.  Dressing signed, dated and # of dressings used indicated. Dr. requested initiation of plans for home; perhaps Wednesday of this week (07/02/12) with [facility]  WOC Nurse will follow with you. Thanks, Ladona Mow, MSN, RN, [facility], CWOCN [phone])

## 2012-07-01 LAB — BASIC METABOLIC PANEL
Chloride: 98 mEq/L (ref 96–112)
GFR calc Af Amer: 83 mL/min — ABNORMAL LOW (ref >90)
GFR calc non Af Amer: 72 mL/min — ABNORMAL LOW (ref >90)
Potassium: 3.8 mEq/L (ref 3.5–5.1)
Sodium: 132 mEq/L — ABNORMAL LOW (ref 135–145)

## 2012-07-01 LAB — GLUCOSE, CAPILLARY
Glucose-Capillary: 109 mg/dL — ABNORMAL HIGH (ref 70–99)
Glucose-Capillary: 126 mg/dL — ABNORMAL HIGH (ref 70–99)
Glucose-Capillary: 151 mg/dL — ABNORMAL HIGH (ref 70–99)
Glucose-Capillary: 151 mg/dL — ABNORMAL HIGH (ref 70–99)

## 2012-07-01 LAB — CBC
HCT: 33.3 % — ABNORMAL LOW (ref 36.0–46.0)
Hemoglobin: 10.4 g/dL — ABNORMAL LOW (ref 12.0–15.0)
MCHC: 31.2 g/dL (ref 30.0–36.0)
RBC: 4.27 MIL/uL (ref 3.87–5.11)
WBC: 11 10*3/uL — ABNORMAL HIGH (ref 4.0–10.5)

## 2012-07-01 MED ORDER — HYDROMORPHONE HCL PF 1 MG/ML IJ SOLN: 1.0000 mg | INTRAMUSCULAR | Status: DC | PRN

## 2012-07-01 MED ORDER — PANTOPRAZOLE SODIUM 40 MG PO TBEC
40.0000 mg | DELAYED_RELEASE_TABLET | ORAL | Status: DC
  Administered 2012-07-01: 40 mg via ORAL
  Filled 2012-07-01 (×2): qty 1

## 2012-07-01 NOTE — Progress Notes (Signed)
5 Days Post-Op  Subjective: Doing well. No vomiting. A little nausea. Min belching. Lots of gas. +bm. sore  Objective: Vital signs in last 24 hours: Temp:  [97.9 F (36.6 C)-98.8 F (37.1 C)] 98.3 F (36.8 C) (10/08 1000) Pulse Rate:  [71-75] 71  (10/08 1000) Resp:  [16-20] 18  (10/08 1000) BP: (97-138)/(52-72) 117/70 mmHg (10/08 1000) SpO2:  [89 %-98 %] 98 % (10/08 1000) Last BM Date: 06/30/12  Intake/Output from previous day: 10/07 0701 - 10/08 0700 In: 2908.8 [P.O.:1080; I.V.:1828.8] Out: 1376 [Urine:1375; Stool:1] Intake/Output this shift: Total I/O In: 120 [P.O.:120] Out: 300 [Urine:300]  Alert, nad cta b/l Reg Obese, soft, wound vac intact. +BS No edema, +scds  Lab Results:   Basename 07/01/12 0341 06/30/12 0808  WBC 11.0* 10.5  HGB 10.4* 11.3*  HCT 33.3* 35.7*  PLT 353 368   BMET  Basename 07/01/12 0341 06/30/12 0808  NA 132* 132*  K 3.8 3.8  CL 98 96  CO2 26 27  GLUCOSE 134* 128*  BUN 15 13  CREATININE 0.80 0.61  CALCIUM 8.0* 8.1*   PT/INR No results found for this basename: LABPROT:2,INR:2 in the last 72 hours ABG No results found for this basename: PHART:2,PCO2:2,PO2:2,HCO3:2 in the last 72 hours  Studies/Results: No results found.  Anti-infectives: Anti-infectives     Start     Dose/Rate Route Frequency Ordered Stop   06/26/12 1000   linezolid (ZYVOX) IVPB 600 mg        600 mg 300 mL/hr over 60 Minutes Intravenous Every 12 hours 06/26/12 0904 06/26/12 0942   06/26/12 0900   ciprofloxacin (CIPRO) IVPB 400 mg        400 mg 200 mL/hr over 60 Minutes Intravenous Every 12 hours 06/26/12 0904 06/26/12 0915   06/26/12 0643   metroNIDAZOLE (FLAGYL) IVPB 500 mg        500 mg 100 mL/hr over 60 Minutes Intravenous 60 min pre-op 06/26/12 0643 06/26/12 0900          Assessment/Plan: s/p Procedure(s) (LRB) with comments: COLOSTOMY REVERSAL (N/A) PROCTOSCOPY (N/A) - Rigid Proctoscopy LYSIS OF ADHESION () - COLON RESECTION  (N/A) APPLICATION OF WOUND VAC ()  pca off Adv to regular diet Cont VTE prophylaxis Anticipate home Wednesday  Patient has morbid obesity (BMI 50) with OSA on CPAP and is s/p major abdominal surgery which requires the patient to be positioned in ways not feasible with a normal bed. Head must be elevated at least 30degrees or more to help prevent aspiration.  Therefore a bariatric bed is requested.   Michelle Sella. Andrey Campanile, MD, FACS General, Bariatric, & Minimally Invasive Surgery [facility], [place]   LOS: 5 days    [name] 07/01/2012

## 2012-07-01 NOTE — Evaluation (Signed)
Physical Therapy Evaluation Patient Details Name: [name] MRN: [id number] DOB: 1940-12-26 Today's Date: 07/01/2012 Time: 0940-1005 PT Time Calculation (min): 25 min  PT Assessment / Plan / Recommendation Clinical Impression  71 yo female s/p colon resection. Pt needed some assist PTA at baseline for ADLs-has aide 7x/week. Mobilizing fairly well. Demonstrates decreased activity tolerance, and mobility is limited by pain. Recommend HHPT.     PT Assessment  Patient needs continued PT services    Follow Up Recommendations  Home health PT    Does the patient have the potential to tolerate intense rehabilitation      Barriers to Discharge        Equipment Recommendations  None recommended by PT    Recommendations for Other Services OT consult   Frequency Min 3X/week    Precautions / Restrictions Precautions Precautions: Fall Precaution Comments: pt with wound vac Restrictions Weight Bearing Restrictions: No   Pertinent Vitals/Pain 7-8 abdomen with activity      Mobility  Bed Mobility Bed Mobility: Not assessed Details for Bed Mobility Assistance: Pt sitting in recliner.  Transfers Transfers: Sit to Stand;Stand to Sit Sit to Stand: 4: Min assist;From chair/3-in-1;With armrests Stand to Sit: 4: Min guard;To chair/3-in-1;With armrests Details for Transfer Assistance: Min A for trunk shift anteriorly to allow for stand. Only needed Min-guard assist to rise from chair.  Ambulation/Gait Ambulation/Gait Assistance: 4: Min guard Ambulation Distance (Feet): 75 Feet Assistive device: Rolling walker Ambulation/Gait Assistance Details: VCs safety. Fatigues somewhat easily.  Gait Pattern: Step-through pattern;Decreased stride length;Decreased step length - right;Decreased step length - left    Shoulder Instructions     Exercises     PT Diagnosis: Difficulty walking;Acute pain  PT Problem List: Decreased activity tolerance;Decreased mobility;Pain PT Treatment  Interventions: Gait training;Functional mobility training;Therapeutic activities;Therapeutic exercise;Patient/family education   PT Goals Acute Rehab PT Goals PT Goal Formulation: With patient Time For Goal Achievement: 07/15/12 Potential to Achieve Goals: Good Pt will go Supine/Side to Sit: with supervision PT Goal: Supine/Side to Sit - Progress: Goal set today Pt will go Sit to Supine/Side: with supervision PT Goal: Sit to Supine/Side - Progress: Goal set today Pt will go Sit to Stand: with supervision PT Goal: Sit to Stand - Progress: Goal set today Pt will Ambulate: 51 - 150 feet;with supervision;with least restrictive assistive device PT Goal: Ambulate - Progress: Goal set today  Visit Information  Last PT Received On: 07/01/12 Assistance Needed: +1    Subjective Data  Subjective: "I'm gonna need help" Patient Stated Goal: Home   Prior Functioning  Home Living Lives With: Spouse Available Help at Discharge: Personal care attendant Type of Home: House Home Access: Stairs to enter [employer] of Steps: 2-4 (not very steep per pt) Entrance Stairs-Rails: Right;Left;Can reach both Home Layout: Two level;Able to live on main level with bedroom/bathroom Bathroom Toilet: Standard Home Adaptive Equipment: Bedside commode/3-in-1;Walker - rolling;Tub transfer bench Prior Function Able to Take Stairs?: Yes Driving: No Vocation: Retired Musician: No difficulties    Cognition  Overall Cognitive Status: Appears within functional limits for tasks assessed/performed Arousal/Alertness: Awake/alert Orientation Level: Appears intact for tasks assessed Behavior During Session: [facility] for tasks performed    Extremity/Trunk Assessment Right Lower Extremity Assessment RLE ROM/Strength/Tone: [facility] for tasks assessed Left Lower Extremity Assessment LLE ROM/Strength/Tone: [facility] for tasks assessed Trunk Assessment Trunk Assessment: Normal   Balance  Balance Balance Assessed: Yes Dynamic Standing Balance Dynamic Standing - Level of Assistance: 5: Stand by assistance;Other (comment) (to pull up gown)  End of Session PT - End of Session Activity Tolerance: Patient limited by fatigue;Patient limited by pain Patient left: in chair;with call bell/phone within reach  GP     Rebeca Alert [facility] 07/01/2012, 11:12 AM [phone]

## 2012-07-01 NOTE — Progress Notes (Signed)
Occupational Therapy Treatment Patient Details Name: [name] MRN: [id number] DOB: 1940-10-25 Today's Date: 07/01/2012 Time: 0940-1001 OT Time Calculation (min): 21 min  OT Assessment / Plan / Recommendation Comments on Treatment Session Pt still with pain especially with transition movements but she is very willing to participate and did well. O2 sats to 89% on RA but up to 92% with rest on toilet and PLB. Replaced O2 once back in chair aftter ambulation. Plans to have aide continue at discharge.    Follow Up Recommendations  Home health OT    Barriers to Discharge       Equipment Recommendations  None recommended by OT    Recommendations for Other Services    Frequency Min 2X/week   Plan Discharge plan remains appropriate    Precautions / Restrictions Precautions Precautions: Fall Precaution Comments: pt with wound vac Restrictions Weight Bearing Restrictions: No        ADL  Toilet Transfer: Performed;Min guard Toilet Transfer Method: Other (comment) (transfer into bathroom with RW) Toilet Transfer Equipment: Raised toilet seat with arms (or 3-in-1 over toilet) Toileting - Clothing Manipulation and Hygiene: Simulated;Min guard Where Assessed - Toileting Clothing Manipulation and Hygiene: Sit to stand from 3-in-1 or toilet ADL Comments: Pt states she has had difficulty with toilet hygiene for awhile. Educated on toilet aid options and coverage. Also discussed LB AE and pt does own a reacher. She is not interested in LB AE right now and hopes to be able to bring LE up on the bed like she used to. She is interested in toilet aid. She does need min assist to lean forward in chair but once in sitting with UEs on armrests, was min guard assist to stand from chair.     OT Diagnosis:    OT Problem List:   OT Treatment Interventions:     OT Goals ADL Goals ADL Goal: Additional Goal #1 - Progress: Progressing toward goals  Visit Information  Last OT Received On:  07/01/12 Assistance Needed: +1 PT/OT Co-Evaluation/Treatment: Yes    Subjective Data  Subjective: pt greets therapists and agreeable to work with therapy Patient Stated Goal:  as above   Prior Functioning       Cognition  Overall Cognitive Status: Appears within functional limits for tasks assessed/performed Arousal/Alertness: Awake/alert Orientation Level: Appears intact for tasks assessed Behavior During Session: [facility] for tasks performed    Mobility  Shoulder Instructions Transfers Transfers: Sit to Stand;Stand to Sit Sit to Stand: 4: Min guard;With upper extremity assist;From chair/3-in-1 Stand to Sit: 4: Min guard;With upper extremity assist;To chair/3-in-1 Details for Transfer Assistance: min verbal cues for hand placement/safety       Exercises      Balance Balance Balance Assessed: Yes Dynamic Standing Balance Dynamic Standing - Level of Assistance: 5: Stand by assistance;Other (comment) (to pull up gown)   End of Session OT - End of Session Activity Tolerance: Patient limited by pain;Other (comment) (although did well) Patient left: in chair;with call bell/phone within reach  GO     Lennox Laity [phone] 07/01/2012, 11:02 AM

## 2012-07-01 NOTE — Progress Notes (Signed)
This patient is receiving IV Protonix. Based on criteria approved by the Pharmacy and Therapeutics Committee, this medication is being converted to the equivalent oral dose form. These criteria include:   . The patient is eating (either orally or per tube) and/or has been taking other orally administered medications for at least 24 hours.  . This patient has no evidence of active gastrointestinal bleeding or impaired GI absorption (gastrectomy, short bowel, patient on TNA or NPO).   If you have questions about this conversion, please contact the pharmacy department.  Reece Packer, [facility] 07/01/2012 2:37 PM

## 2012-07-02 MED ORDER — HYDROCODONE-ACETAMINOPHEN 10-325 MG PO TABS: 1.0000 | ORAL_TABLET | ORAL | Status: DC | PRN

## 2012-07-02 NOTE — Discharge Summary (Signed)
Physician Discharge Summary  Patient ID: [name] MRN: [id number] DOB/AGE: 1941-05-19 71 y.o.  Admit date: 06/26/2012 Discharge date: 07/02/2012  Admission Diagnoses: Patient Active Problem List  Diagnosis  . OBSTRUCTIVE SLEEP APNEA  . 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 Hypertension  . Diabetes mellitus  . 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 She underwent extensive lysis of adhesions, colostomy takedown with colectomy with a side to end anastomosis, and placement of wound VAC. She was maintained on perioperative chemical DVT prophylaxis. She was also maintained on perioperative Entereg. She was given ice chips and sips of water on postop day 1. She was advanced to a clear liquid diet on postop day 3. The patient requested and refused to have her Foley removed until postop day 3. Physical therapy and occupational therapy were consult. She was maintained on her home CPAP settings at night. Her diet was advanced. She started having a bowel function. She has had several bowel movements since surgery. She is tolerating a regular diet. Her vital signs are stable. Her wound VAC has been changed 2 days ago. Today on discharge she was deemed stable for discharge. She will get her wound VAC changed today prior to going home. We are also going to document her room air O2 saturation while ambulating to determine whether or not the patient needs home oxygen. I discussed discharge instructions with the patient.  Consults: None  Significant Diagnostic Studies: labs:  hgb 10.2; electrolytes normal  Treatments: IV hydration, analgesia: acetaminophen, Dilaudid and toradol and percocet, respiratory therapy: O2 and CPAP, therapies: PT, OT and RN and surgery: LYSIS OF ADHESIONS, COLOSTOMY TAKEDOWN WITH COLON RESECTION, PLACEMENT OF WOUND VAC 06/26/12  Discharge Exam: Blood pressure 130/75, pulse 93, temperature 98 F (36.7 C), temperature source Oral, resp. rate 20, height 5\' 5"  (1.651 m), weight 303 lb 9.2 oz (137.7 kg), SpO2 96.00%. Alert, nad, asleep on CPAP, awakes easily, appropriate cta ant Reg Obese, soft, wound vac intact, +BS +SCDs, no edema  Disposition: Home with Home health nursing (PT, OT, nursing)  Discharge Orders    Future Orders Please Complete By Expires   Diet Carb Modified      Increase activity slowly          Medication List     As of 07/02/2012  7:38 AM    STOP taking these medications         acetaminophen 500 MG tablet   Commonly known as: TYLENOL      peg 3350 powder 100 G Solr   Commonly known as: MOVIPREP      TAKE these medications         aspirin 81 MG tablet   Take 81 mg by mouth daily. Patient takes 3 daily      CALCIUM-VITAMIN D PO   Take 1 tablet by mouth 2 (two) times daily. 600 d2      CINNAMON PLUS CHROMIUM PO   Take 2 capsules by mouth daily. 600mg       estradiol 0.05 MG/24HR   Commonly known as: VIVELLE-DOT   Place 1 patch onto the skin 2 (two) times  a week.      furosemide 20 MG tablet   Commonly known as: LASIX   20 mg as needed.      GLUCOSAMINE-CHONDROITIN PLUS PO   Take 2 tablets by mouth daily. Patient takes one in am and one in pm      HYDROcodone-acetaminophen 10-325 MG per tablet   Commonly known as: NORCO   Take 1 tablet by mouth every 4 (four) hours as needed. For pain      losartan 100 MG tablet   Commonly known as: COZAAR   Take 100 mg by mouth every morning.      meloxicam 15 MG tablet   Commonly known as: MOBIC   Take 7.5 mg by mouth 2 (two) times daily.       metFORMIN 500 MG tablet   Commonly known as: GLUCOPHAGE   Take 500 mg by mouth 2 (two) times daily before lunch and supper.      montelukast 10 MG tablet   Commonly known as: SINGULAIR   Take 10 mg by mouth daily.      sitaGLIPtin 50 MG tablet   Commonly known as: JANUVIA   Take 50 mg by mouth every morning.      traMADol 50 MG tablet   Commonly known as: ULTRAM   Take 50 mg by mouth at bedtime as needed.      VICKS SINEX NA   Place into the nose at bedtime.           Follow-up Information    Follow up with [facility]. (wound care, PT, OT)    Contact information:   [address] [place] [address] [phone]      Follow up with Atilano Ina, MD,FACS. Schedule an appointment as soon as possible for a visit in 3 weeks.   Contact information:   [address] [place] [address] [phone]       Follow up with [facility] Aide, resuming)    Contact information:   [address] [place], [place]  [address] [phone] or [phone]         Signed: Atilano Ina 07/02/2012, 7:38 AM

## 2012-07-02 NOTE — Progress Notes (Signed)
Patient sats 91-93% on Room Air for several hours. Does not qualify for home o2. KCI vac placed before discharge. No problems with VAC. Patient concerned about the level of noise it makes. Reasscured her. No other concerns at this time. Patient discharged.

## 2012-07-03 ENCOUNTER — Telehealth (INDEPENDENT_AMBULATORY_CARE_PROVIDER_SITE_OTHER): Payer: Self-pay

## 2012-07-03 ENCOUNTER — Other Ambulatory Visit (INDEPENDENT_AMBULATORY_CARE_PROVIDER_SITE_OTHER): Payer: Self-pay

## 2012-07-03 DIAGNOSIS — E119 Type 2 diabetes mellitus without complications: Secondary | ICD-10-CM

## 2012-07-03 DIAGNOSIS — K5732 Diverticulitis of large intestine without perforation or abscess without bleeding: Secondary | ICD-10-CM

## 2012-07-03 DIAGNOSIS — Z9889 Other specified postprocedural states: Secondary | ICD-10-CM

## 2012-07-03 DIAGNOSIS — Z9049 Acquired absence of other specified parts of digestive tract: Secondary | ICD-10-CM

## 2012-07-03 DIAGNOSIS — I10 Essential (primary) hypertension: Secondary | ICD-10-CM

## 2012-07-03 DIAGNOSIS — K668 Other specified disorders of peritoneum: Secondary | ICD-10-CM

## 2012-07-03 DIAGNOSIS — G4733 Obstructive sleep apnea (adult) (pediatric): Secondary | ICD-10-CM

## 2012-07-03 NOTE — Telephone Encounter (Signed)
[facility] states that patient does not meet criteria for a Bariatric Bed, per [facility] patient must weigh 600 lbs to qualify for this bed type.  Order for Standard Hospital Bed will be faxed to [phone].  Confirmed with [facility] that Standard Bed will elevate patient head 30 degrees as noted in DC Summary.  Patient notified that Gel Overlay will not be acceptable at this time and Dr. Tawana Scale DC instructions state she must elevate her head 30 degrees at night to prevent her from aspirating.

## 2012-07-03 NOTE — Telephone Encounter (Signed)
New order faxed to [facility] @ [phone] for In [facility] Bed

## 2012-07-03 NOTE — Telephone Encounter (Signed)
Patient called stating [facility] will bring a Bariatric Bed to her home.  Patient would like for the order to be changed to a Gel Overlay Mattress instead of Bariatric Bed.  Patient reports sleeping in a recliner last night.  Patient ask if we could call [facility] @ [phone] and speak to them regarding the bed change or fax an order to [facility] @ [phone].

## 2012-07-06 ENCOUNTER — Telehealth (INDEPENDENT_AMBULATORY_CARE_PROVIDER_SITE_OTHER): Payer: Self-pay | Admitting: General Surgery

## 2012-07-06 ENCOUNTER — Other Ambulatory Visit (INDEPENDENT_AMBULATORY_CARE_PROVIDER_SITE_OTHER): Payer: Self-pay | Admitting: General Surgery

## 2012-07-06 NOTE — Telephone Encounter (Signed)
She called to tell me that her aide disimpacted her and she felt better.

## 2012-07-06 NOTE — Telephone Encounter (Signed)
She called because she is constipated and has not had a BM in 3 days.  She took Miralax and a stool softener yesterday without any result.  I told her to continue the Miralax and add Dulcolax tablets to the regimen today-take two tablet now and two tablets this evening.  She also said she has been having some fever at times up to 100.9.  No dysuria.  I told her someone from the office would call her tomorrow and check on her and if she was not any better they would arrange for her to be seen.

## 2012-07-07 ENCOUNTER — Telehealth (INDEPENDENT_AMBULATORY_CARE_PROVIDER_SITE_OTHER): Payer: Self-pay | Admitting: General Surgery

## 2012-07-07 NOTE — Telephone Encounter (Signed)
Just an FYI...maria with [facility] wanted to let you know that when patient is up walking that her o2 stats drop to 76% and after resting they will go back up to 92%...paitent's PCP is aware but Byrd Hesselbach has to inform you as well per protocol

## 2012-07-07 NOTE — Telephone Encounter (Signed)
Called patient to check on her after she was having trouble with moving her bowels and having fevers this weekend. She is feeling much better after being disimpacted this weekend. She states she still gets small bouts of fevers that last less than 20 minutes and have gotten as high as 100.9. She is feeling great today and will keep her appt on 07/16/2012 and will call with any problems prior. She will stay on the miralax and dulcolax daily.

## 2012-07-09 ENCOUNTER — Encounter (INDEPENDENT_AMBULATORY_CARE_PROVIDER_SITE_OTHER): Payer: Self-pay | Admitting: General Surgery

## 2012-07-09 ENCOUNTER — Ambulatory Visit (INDEPENDENT_AMBULATORY_CARE_PROVIDER_SITE_OTHER): Payer: Medicare Other | Admitting: General Surgery

## 2012-07-09 VITALS — BP 142/78 | HR 74 | Temp 97.7°F | Resp 18 | Ht 65.0 in

## 2012-07-09 DIAGNOSIS — Z5189 Encounter for other specified aftercare: Secondary | ICD-10-CM

## 2012-07-09 DIAGNOSIS — Z4889 Encounter for other specified surgical aftercare: Secondary | ICD-10-CM

## 2012-07-09 MED ORDER — HYDROCORTISONE 1 % EX CREA: TOPICAL_CREAM | CUTANEOUS | Status: DC

## 2012-07-09 NOTE — Progress Notes (Signed)
Subjective:     Patient ID: [name], female   DOB: [birth year], 71 y.o.   MRN: [id number]  HPI S/p colostomy takedown 06/26/12.  She has been moving her bowels, twice today, and she says that her abdominal pain is improving.  She is tolerating diet but does have some intermittent low temps of 100.8 range.  Her home health nurse recommended that she come in to have the wound looked at because she was having a difficult time sealing the wound vac.  Review of Systems     Objective:   Physical Exam NAD, deconditioned nonlabored breathing Her wound is healing fine with healthy granulation tissue, she has some fibrinous exudate but I do not see any concerns with the wound.  I repacked it with moist kerlix gauze and wound redressed.  She also has some skin welts and redness on bilat lower abdomen and thighs.  Several circular areas about 6cm in diameter, possible sites of lovenox injections??    Assessment:     S/p colostomy takedown She is very deconditioned and she is having some low oxygen sats.  I think that this is baseline for her.  She is breathing okay and sats adequate.  I think that she may benefit from some home oxygen and I tried to call her primary physician to set this up but no answer.  We will try to arrange this with home health until her primary physician can manage the settings and need.  As far as her wound is concerned, it looks okay without infection.  I recommended BID wet to dry until Friday when her home health nurse can replace the wound vac.  We instructed the family how to do the dressing changes.  I also prescribed a topical hydrocortisone for the redness on her lower abdomen and thighs.  I think that this is probably due to lovenox injections but she says that they are itching.      Plan:     Topical hydrocortisone 1% to skin lesions BID Wet to dry dressings until Friday when wound vac can be replaced. We will try to set up home health oxygen until her primary  physician can manage this.  Her sats are 91% after walking but she is breathing comfortably and I think that this is near her baseline. I do not think that she is having any acute breathing issues and she should be okay to go home and rest with her CPAP.  My nurse will touch base with Dr. Lamar Blinks office tomorrow to see if they can set her up for possible home oxygen.

## 2012-07-11 ENCOUNTER — Other Ambulatory Visit (INDEPENDENT_AMBULATORY_CARE_PROVIDER_SITE_OTHER): Payer: Self-pay

## 2012-07-11 ENCOUNTER — Telehealth (INDEPENDENT_AMBULATORY_CARE_PROVIDER_SITE_OTHER): Payer: Self-pay

## 2012-07-11 ENCOUNTER — Encounter (INDEPENDENT_AMBULATORY_CARE_PROVIDER_SITE_OTHER): Payer: Self-pay

## 2012-07-11 DIAGNOSIS — R0602 Shortness of breath: Secondary | ICD-10-CM

## 2012-07-11 DIAGNOSIS — Z933 Colostomy status: Secondary | ICD-10-CM

## 2012-07-11 DIAGNOSIS — M7989 Other specified soft tissue disorders: Secondary | ICD-10-CM

## 2012-07-11 DIAGNOSIS — R7981 Abnormal blood-gas level: Secondary | ICD-10-CM

## 2012-07-11 NOTE — Addendum Note (Signed)
Addended byLiliana Cline on: 07/11/2012 05:04 PM   Modules accepted: Orders

## 2012-07-11 NOTE — Telephone Encounter (Signed)
[name] said the pt did not qualify for her insurance to get oxygen.  Her sat was 91%.  [name] told her she needs to go to her primary md to be seen and tested.  She declined that and declined self pay on the oxygen.

## 2012-07-11 NOTE — Telephone Encounter (Signed)
Order placed for Venous lower bilateral US, called [name] to schedule appointment, placed on hold for over 15 minutes.  Called main number for [name] [phone] and was given fax numbers for Cardiology/vascular lab [phone], Cardio/pulmonary lab [phone].  Fax orders requesting appointment will follow up.  Per [facility],  Patient O2 sats do not qualify for home oxygen, patient will need to follow up with Dr. Phillips Odor (PCP) for further in home oxygen.

## 2012-07-14 ENCOUNTER — Ambulatory Visit (HOSPITAL_COMMUNITY)
Admission: RE | Admit: 2012-07-14 | Discharge: 2012-07-14 | Disposition: A | Discharge status: Home / Self Care | Payer: Medicare Other | Source: Ambulatory Visit | Attending: General Surgery | Admitting: General Surgery

## 2012-07-14 ENCOUNTER — Telehealth (INDEPENDENT_AMBULATORY_CARE_PROVIDER_SITE_OTHER): Payer: Self-pay

## 2012-07-14 ENCOUNTER — Telehealth (INDEPENDENT_AMBULATORY_CARE_PROVIDER_SITE_OTHER): Payer: Self-pay | Admitting: General Surgery

## 2012-07-14 DIAGNOSIS — M7989 Other specified soft tissue disorders: Secondary | ICD-10-CM

## 2012-07-14 DIAGNOSIS — Z933 Colostomy status: Secondary | ICD-10-CM

## 2012-07-14 DIAGNOSIS — Z9889 Other specified postprocedural states: Secondary | ICD-10-CM | POA: Insufficient documentation

## 2012-07-14 DIAGNOSIS — R0602 Shortness of breath: Secondary | ICD-10-CM | POA: Insufficient documentation

## 2012-07-14 NOTE — Telephone Encounter (Signed)
Patient scheduled today for US VENOUS Doppler/Duplex @ [facility] 07/14/12 @ 2:00 pm.  [facility] not able to approve in home oxygen.  Spoke to Dr. Beatrice Lecher office and was advised to have Ms. Cotten call the office and they will work her in and evaluate low oxygen saturation levels.  Patient aware and will contact Dr. Lamar Blinks office.

## 2012-07-14 NOTE — Telephone Encounter (Signed)
Patient aware no DVT. I encouraged her to discuss leg swelling/ medications with her primary MD who she has an appt with tomorrow. She will discuss this with him and keep follow up appt with [place].

## 2012-07-14 NOTE — Telephone Encounter (Signed)
Message copied by Liliana Cline on Mon Jul 14, 2012  3:45 PM ------      Message from: Andrey Campanile, ERIC M      Created: Mon Jul 14, 2012  3:32 PM       No dvt. Doubt she has PE.

## 2012-07-16 ENCOUNTER — Telehealth (INDEPENDENT_AMBULATORY_CARE_PROVIDER_SITE_OTHER): Payer: Self-pay

## 2012-07-16 ENCOUNTER — Ambulatory Visit (INDEPENDENT_AMBULATORY_CARE_PROVIDER_SITE_OTHER): Payer: Medicare Other | Admitting: General Surgery

## 2012-07-16 ENCOUNTER — Encounter (INDEPENDENT_AMBULATORY_CARE_PROVIDER_SITE_OTHER): Payer: Self-pay | Admitting: General Surgery

## 2012-07-16 VITALS — BP 126/74 | HR 76 | Temp 97.3°F | Resp 18 | Ht 65.0 in | Wt 296.1 lb

## 2012-07-16 DIAGNOSIS — Z09 Encounter for follow-up examination after completed treatment for conditions other than malignant neoplasm: Secondary | ICD-10-CM

## 2012-07-16 NOTE — Telephone Encounter (Signed)
Home health nurse visiting the pt's home today and states there is a very foul "fecal" smelling d/c when the abdomen is palpated.  It is white and purulent.  The family is concerned about the strong odor.  I read Dr. Tawana Scale office note to her, and told her I would pass her message on to him. Please advise.

## 2012-07-16 NOTE — Progress Notes (Signed)
Subjective:     Patient ID: [name], female   DOB: 07-11-1941, 71 y.o.   MRN: [id number]  HPI 71 year old morbidly obese Caucasian female comes in for followup after undergoing colostomy reversal, colon resection, and application of wound VAC on October 3. She saw Dr. Biagio Quint in the office on October 16. She had been experiencing some low oxygen saturation upon ambulating at home as reported by the home care agency. She would desat into the high 70s low 80s upon ambulation. We eventually were able to get the patient on some home oxygen. A duplex ultrasound of her lower extremities a few days ago revealed no evidence of a DVT. She states that her shortness of breath has gotten better since being placed on the supplemental oxygen. She is still using her CPAP at night. She denies any fever, chills, vomiting, diarrhea or constipation. She still was having some abdominal soreness. She reports a good appetite. She reports daily bowel movements. She denies any melena or hematochezia.  Review of Systems     Objective:   Physical Exam BP 126/74  Pulse 76  Temp 97.3 F (36.3 C) (Temporal)  Resp 18  Ht 5\' 5"  (1.651 m)  Wt 296 lb 2 oz (134.321 kg)  BMI 49.28 kg/m2  Gen: alert, NAD, non-toxic appearing Pupils: equal, no scleral icterus Pulm: Lungs clear to auscultation, symmetric chest rise CV: regular rate and rhythm Abd: soft, obese, nondistended. Wound vac in midline and Left old stoma site. Doesn't have cellulitis but more of skin irritation under vac drape. Nontender  Ext: no edema, no calf tenderness Skin: no jaundice; red skin irritation right lateral thigh     Assessment:     S/p colostomy reversal, colon resection, placement of NPWT system    Plan:     Overall, doing as expected. There is no sign of wound infection. There is no sign of bowel contents within the wound VAC canister. Cont with wound vac. Pt has the same skin irritation with this vac application as with her first  procedure. Cont HHPT. F/u 4 weeks.  Mary Sella. Andrey Campanile, MD, FACS General, Bariatric, & Minimally Invasive Surgery [facility], [place]

## 2012-07-16 NOTE — Telephone Encounter (Signed)
Made the home health nurse Byrd Hesselbach) aware that a message was passed on to Dr. Andrey Campanile.

## 2012-07-17 NOTE — Telephone Encounter (Signed)
i'm aware of odor. pls have home health draw cbc with diffl

## 2012-07-18 ENCOUNTER — Telehealth (INDEPENDENT_AMBULATORY_CARE_PROVIDER_SITE_OTHER): Payer: Self-pay

## 2012-07-18 NOTE — Telephone Encounter (Signed)
Verbal order given to nurse for cbc w/ difficile to be drawn.

## 2012-07-19 ENCOUNTER — Telehealth (INDEPENDENT_AMBULATORY_CARE_PROVIDER_SITE_OTHER): Payer: Self-pay | Admitting: General Surgery

## 2012-07-19 DIAGNOSIS — Z09 Encounter for follow-up examination after completed treatment for conditions other than malignant neoplasm: Secondary | ICD-10-CM

## 2012-07-19 DIAGNOSIS — D729 Disorder of white blood cells, unspecified: Secondary | ICD-10-CM

## 2012-07-19 MED ORDER — CIPROFLOXACIN HCL 500 MG PO TABS: 500.0000 mg | ORAL_TABLET | Freq: Two times a day (BID) | ORAL | Status: DC

## 2012-07-19 MED ORDER — METRONIDAZOLE 500 MG PO TABS: 500.0000 mg | ORAL_TABLET | Freq: Three times a day (TID) | ORAL | Status: DC

## 2012-07-19 NOTE — Telephone Encounter (Signed)
[facility] called with pt's cbc that I had ordered after receiving call on Friday regarding odor coming from open healing midline incision - nurse reported pt's wbc was 21k. Called pt - doing well. Good appetite. Normal bms. Incisional discomfort only. No n/v. Had isolated temp of 100.0 yesterday. nml temperature today. Feels good. Will start pt on oral abx. Will have office arrange CT abd/pelvis with oral & iv contrast for Monday late morning/early pm. Pt instructed that abx rx will be sent electronically to pharmacy and was told to pick it up. Instructed to call if anything changes.

## 2012-07-21 ENCOUNTER — Telehealth (INDEPENDENT_AMBULATORY_CARE_PROVIDER_SITE_OTHER): Payer: Self-pay | Admitting: General Surgery

## 2012-07-21 ENCOUNTER — Other Ambulatory Visit (HOSPITAL_COMMUNITY): Payer: Medicare Other

## 2012-07-21 ENCOUNTER — Ambulatory Visit (HOSPITAL_COMMUNITY)
Admission: RE | Admit: 2012-07-21 | Discharge: 2012-07-21 | Disposition: A | Discharge status: Home / Self Care | Payer: Medicare Other | Source: Ambulatory Visit | Attending: General Surgery | Admitting: General Surgery

## 2012-07-21 DIAGNOSIS — D729 Disorder of white blood cells, unspecified: Secondary | ICD-10-CM

## 2012-07-21 DIAGNOSIS — D3500 Benign neoplasm of unspecified adrenal gland: Secondary | ICD-10-CM | POA: Insufficient documentation

## 2012-07-21 DIAGNOSIS — K8020 Calculus of gallbladder without cholecystitis without obstruction: Secondary | ICD-10-CM | POA: Insufficient documentation

## 2012-07-21 DIAGNOSIS — K861 Other chronic pancreatitis: Secondary | ICD-10-CM | POA: Insufficient documentation

## 2012-07-21 DIAGNOSIS — Z09 Encounter for follow-up examination after completed treatment for conditions other than malignant neoplasm: Secondary | ICD-10-CM

## 2012-07-21 MED ORDER — IOHEXOL 300 MG/ML  SOLN
100.0000 mL | Freq: Once | INTRAMUSCULAR | Status: AC | PRN
  Administered 2012-07-21: 100 mL via INTRAVENOUS

## 2012-07-21 NOTE — Telephone Encounter (Signed)
Spoke to patient and she stated Dr Andrey Campanile wants her to have CT here. I set up new appt at [facility] for 1:00 pm today. Patient aware no more eating today prior to test and to pick up contrast. She is aware of instructions. She will call with any additional questions.

## 2012-07-21 NOTE — Telephone Encounter (Signed)
[facility] CT results called, suggesting abscess.  Paged Dr. Andrey Campanile in the OR and related report.  He stated for pt to continue her antibiotics and okay for her to go home.  CT tech will relate this to the pt.

## 2012-07-21 NOTE — Addendum Note (Signed)
Addended byLiliana Cline on: 07/21/2012 08:30 AM   Modules accepted: Orders

## 2012-07-21 NOTE — Telephone Encounter (Signed)
CT set up at [facility] for today at 4:00 pm.

## 2012-07-22 ENCOUNTER — Telehealth (INDEPENDENT_AMBULATORY_CARE_PROVIDER_SITE_OTHER): Payer: Self-pay | Admitting: General Surgery

## 2012-07-22 NOTE — Telephone Encounter (Signed)
Message copied by Liliana Cline on Tue Jul 22, 2012  8:51 AM ------      Message from: Andrey Campanile, ERIC M      Created: Tue Jul 22, 2012  8:39 AM       Hey,            Can you get ms Wailes to come in tomorrow?  i want to look at her wound. Tell them to bring wound vac supplies            Thanks,      wilson

## 2012-07-22 NOTE — Telephone Encounter (Signed)
Appt made. Will bring wound vac supplies.

## 2012-07-23 ENCOUNTER — Encounter (INDEPENDENT_AMBULATORY_CARE_PROVIDER_SITE_OTHER): Payer: Self-pay | Admitting: General Surgery

## 2012-07-23 ENCOUNTER — Telehealth (INDEPENDENT_AMBULATORY_CARE_PROVIDER_SITE_OTHER): Payer: Self-pay | Admitting: General Surgery

## 2012-07-23 ENCOUNTER — Ambulatory Visit (INDEPENDENT_AMBULATORY_CARE_PROVIDER_SITE_OTHER): Payer: Medicare Other | Admitting: General Surgery

## 2012-07-23 VITALS — BP 136/82 | HR 75 | Temp 97.3°F | Resp 18

## 2012-07-23 DIAGNOSIS — Z09 Encounter for follow-up examination after completed treatment for conditions other than malignant neoplasm: Secondary | ICD-10-CM

## 2012-07-23 MED ORDER — CIPROFLOXACIN HCL 500 MG PO TABS: 500.0000 mg | ORAL_TABLET | Freq: Two times a day (BID) | ORAL | Status: AC

## 2012-07-23 MED ORDER — METRONIDAZOLE 500 MG PO TABS: 500.0000 mg | ORAL_TABLET | Freq: Three times a day (TID) | ORAL | Status: DC

## 2012-07-23 NOTE — Patient Instructions (Addendum)
i will give an additional 5 days of antibiotics. Pick up prescriptions at pharmacy. We will switch to wet-dry dressings once a day for about a week and then re-apply the wound vac

## 2012-07-23 NOTE — Progress Notes (Signed)
Subjective:     Patient ID: [name], female   DOB: 12/03/1940, 71 y.o.   MRN: [id number]  HPI 71 year old morbidly obese Caucasian female comes in for a wound check. She underwent with colectomy with colostomy reversal on September 3. I saw her in the office last week. About a day or 2 after her visit, the home health agency called with low-grade fevers as well as a foul-smelling odor coming from the wound. I ordered a CBC which revealed a white blood cell count 21,000. She was started on Cipro and Flagyl. We arranged for her to have a CT scan on Monday. This revealed some fluid and gas deep in the incision. There was no free air or deep intra-abdominal abscess. She states that the odor has resolved since starting on antibiotics. She denies any fevers or chills recently. She reports a good appetite. She reports normal bowel movements. She states that it is still tender when she has a wound VAC change.  Review of Systems     Objective:   Physical Exam BP 136/82  Pulse 75  Temp 97.3 F (36.3 C)  Resp 18 Alert, no apparent distress Abdomen-the skin irritation that was present last week has dramatically improved. There is no cellulitis. The wound VAC was removed and revealed a large bed of granulation tissue along the midline incision and the old ostomy site. I could not express any drainage. I took a cotton tip applicator and pried open several areas of the midline incision as well as the old ostomy site. There was a little bit of drainage of fluid from those sites.    Assessment:     Status post colectomy with colostomy takedown and placement of wound VAC    Plan:     We removed the wound VAC today and placed some wet-to-dry gauze into the 3 areas that I pried open and then laid moist 4 x 4's over the remainder part of the incision. I would like her to undergo wet to dry packing until I see her in the clinic next Friday in an attempt to close up these deep open spaces. My guess was that  the wound VAC sponge were not placed deep enough on subsequent wound VAC changes which allowed these deep open spaces to persist. I gave her an additional 4 days of antibiotics. Followup next Friday  Mary Sella. Andrey Campanile, MD, FACS General, Bariatric, & Minimally Invasive Surgery [facility], [place]

## 2012-07-23 NOTE — Telephone Encounter (Signed)
Called [facility] and spoke with [place]. Given orders for daily wet to dry dressing changes until patient sees [place] in the office next Friday to evaluate the wound. They will call with any questions.

## 2012-08-01 ENCOUNTER — Ambulatory Visit (INDEPENDENT_AMBULATORY_CARE_PROVIDER_SITE_OTHER): Payer: Medicare Other | Admitting: General Surgery

## 2012-08-01 ENCOUNTER — Encounter (INDEPENDENT_AMBULATORY_CARE_PROVIDER_SITE_OTHER): Payer: Self-pay | Admitting: General Surgery

## 2012-08-01 ENCOUNTER — Telehealth (INDEPENDENT_AMBULATORY_CARE_PROVIDER_SITE_OTHER): Payer: Self-pay | Admitting: General Surgery

## 2012-08-01 VITALS — BP 128/74 | HR 78 | Temp 97.8°F | Resp 16 | Ht 65.0 in

## 2012-08-01 DIAGNOSIS — Z09 Encounter for follow-up examination after completed treatment for conditions other than malignant neoplasm: Secondary | ICD-10-CM

## 2012-08-01 NOTE — Telephone Encounter (Signed)
Message copied by Liliana Cline on Fri Aug 01, 2012  2:33 PM ------      Message from: Leanne Chang      Created: Fri Aug 01, 2012 12:18 PM      Regarding: Dr Lanier Prude: [phone]       Call with appt around 08/29/12. Wound check

## 2012-08-01 NOTE — Patient Instructions (Signed)
Continue wet-dry dressings at least daily We will check a cbc on you

## 2012-08-01 NOTE — Telephone Encounter (Signed)
Note faxed to [facility] letting them know to draw CBC with diff and continue wet to dry dressing changes daily to patient's abdominal wound. Faxed to [phone] with copy of Dr Tawana Scale office note. Confirmation received. Follow up appt made 08/27/2012. Patient aware.

## 2012-08-01 NOTE — Progress Notes (Signed)
Subjective:     Patient ID: [name], female   DOB: September 18, 1941, 71 y.o.   MRN: [id number]  HPI 71 year old morbidly obese Caucasian female status post colostomy reversal with colectomy on September 3 comes in for another wound check. I last saw her in the office on October 30. At that time it appeared that the wound VAC sponge had not been packed deep enough resulting in accumulation of fluid deep at the base of the surgical incision. This was confirmed on the CT scan. The patient had also been started on Cipro and Flagyl for an elevated white blood cell count. I opened up several of these areas and we discontinued the wound VAC and switch to wet-to-dry dressings. She states that she's been doing well. She states that she has had some nausea associated with taking the antibiotics. She has one day left. She denies any fevers, chills, vomiting, constipation. She is still taking MiraLAX. She states that she is having some loose stools. She denies any worsening abdominal pain. She states that she has had her sister-in-law helping with her wound changes.  Review of Systems     Objective:   Physical Exam BP 128/74  Pulse 78  Temp 97.8 F (36.6 C) (Temporal)  Resp 16  Ht 5\' 5"  (1.651 m) No apparent distress Clear to auscultation anteriorly Obese, soft, nontender; midline incision has a large long bed of granulation tissue. There is some dried purulence on the gauze when I removed it. There is no odor. There is no surrounding cellulitis. There are 2 areas in the midline incision that track deep. Otherwise the granulation tissue is intact. I was not able to express any purulent drainage from these 2 sites. The old ostomy site has no surrounding cellulitis has a large bed of granulation tissue. the depth of the wound is minimal.    Assessment:     Status post colostomy reversal with colectomy    Plan:     The patient was instructed to finish her antibiotic course. We will repeat a CBC. Otherwise  she looks very well. I told her to stop taking the MiraLAX. We will continue with wet-to-dry gauze for now. Followup 3-4 weeks  Michelle Sella. Andrey Campanile, MD, FACS General, Bariatric, & Minimally Invasive Surgery [facility], [place]

## 2012-08-05 ENCOUNTER — Telehealth (INDEPENDENT_AMBULATORY_CARE_PROVIDER_SITE_OTHER): Payer: Self-pay

## 2012-08-05 NOTE — Telephone Encounter (Signed)
Spoke with patient and made her aware Dr Andrey Campanile not in the office. I reviewed her labs with Dr Jamey Ripa who recommended a wound check in urgent office. Appt made with patient.

## 2012-08-05 NOTE — Telephone Encounter (Signed)
Patient called in requesting to speak with [name], patient made aware that she was not available at the time and currently working with a physician in the clinic seeing patients.  [name] was not happy that she could not speak with her.  Patient requesting her lab results and would like to know what Dr. Andrey Campanile recommends.  I did not see any recent lab results in he chart at the time of her call.  I made [name] aware that I would forward a message to both Dr. Andrey Campanile and [name] and she would receive a call back from our office once reviewed.  [name] states "she expects a call back today" then hung the phone up.

## 2012-08-06 ENCOUNTER — Ambulatory Visit (INDEPENDENT_AMBULATORY_CARE_PROVIDER_SITE_OTHER): Payer: Medicare Other | Admitting: General Surgery

## 2012-08-06 ENCOUNTER — Encounter (INDEPENDENT_AMBULATORY_CARE_PROVIDER_SITE_OTHER): Payer: Self-pay | Admitting: General Surgery

## 2012-08-06 ENCOUNTER — Encounter (INDEPENDENT_AMBULATORY_CARE_PROVIDER_SITE_OTHER): Payer: Self-pay

## 2012-08-06 VITALS — BP 98/44 | HR 80 | Temp 98.5°F | Ht 65.0 in

## 2012-08-06 DIAGNOSIS — D72829 Elevated white blood cell count, unspecified: Secondary | ICD-10-CM

## 2012-08-06 MED ORDER — LEVOFLOXACIN 750 MG PO TABS: 750.0000 mg | ORAL_TABLET | Freq: Every day | ORAL | Status: DC

## 2012-08-06 MED ORDER — METRONIDAZOLE 500 MG PO TABS: 500.0000 mg | ORAL_TABLET | Freq: Three times a day (TID) | ORAL | Status: DC

## 2012-08-06 NOTE — Patient Instructions (Signed)
You have been scheduled for a CT scan tomorrow morning at       .  I will notify Dr Andrey Campanile of your visit and the CT.  Someone from our office will call you with the results.

## 2012-08-06 NOTE — Progress Notes (Signed)
[name] is a 71 y.o. female who is status post a Colectomy on 10/29.  She has had a rough course postoperatively.  She has been feeling fatigued.  She denies fevers or chills, nausea or vomiting, chest pian or SOB.  She is tolerating a diet and having bowel movements.  No cough.  No dysuria  Objective: Filed Vitals:   08/06/12 1621  BP: 98/44  Pulse: 80  Temp: 98.5 F (36.9 C)    General appearance: alert and cooperative Resp: clear to auscultation bilaterally Cardio: regular rate and rhythm GI: normal findings: soft, non-tender Mass palpated under old ostomy site Incision: purulent drainage, unknown etiology   Assessment: s/p  Patient Active Problem List  Diagnosis  . OBSTRUCTIVE SLEEP APNEA  . 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 Hypertension  . Diabetes mellitus  . Obesity, Class III, BMI 40-49.9 (morbid obesity)  . S/P colon resection   Lab Results  Component Value Date   WBC 11.0* 07/01/2012   HGB 10.4* 07/01/2012   HCT 33.3* 07/01/2012   MCV 78.0 07/01/2012   PLT 353 07/01/2012    WBC 17.7 on 11/11  Plan: Elevated WBC, wound draining purulence.  Will get Abd CT to evaluate for abscess.  Pt may need drain placed if abscess is deep.      Vanita Panda, MD [facility] Surgery, [place] [phone]   08/06/2012 4:43 PM

## 2012-08-07 ENCOUNTER — Ambulatory Visit
Admission: RE | Admit: 2012-08-07 | Discharge: 2012-08-07 | Disposition: A | Discharge status: Home / Self Care | Payer: Medicare Other | Source: Ambulatory Visit | Attending: General Surgery | Admitting: General Surgery

## 2012-08-07 ENCOUNTER — Telehealth (INDEPENDENT_AMBULATORY_CARE_PROVIDER_SITE_OTHER): Payer: Self-pay

## 2012-08-07 DIAGNOSIS — D72829 Elevated white blood cell count, unspecified: Secondary | ICD-10-CM

## 2012-08-07 MED ORDER — IOHEXOL 300 MG/ML  SOLN
125.0000 mL | Freq: Once | INTRAMUSCULAR | Status: AC | PRN
  Administered 2012-08-07: 125 mL via INTRAVENOUS

## 2012-08-07 NOTE — Telephone Encounter (Signed)
I called and gave an order per Dr Maisie Fus to have a cbc drawn early next week.  I spoke to [place].  Dr Maisie Fus has looked at the ct but is awaiting the report.

## 2012-08-08 ENCOUNTER — Encounter (INDEPENDENT_AMBULATORY_CARE_PROVIDER_SITE_OTHER): Payer: Self-pay | Admitting: General Surgery

## 2012-08-08 ENCOUNTER — Encounter (INDEPENDENT_AMBULATORY_CARE_PROVIDER_SITE_OTHER): Payer: Medicare Other | Admitting: General Surgery

## 2012-08-08 ENCOUNTER — Telehealth (INDEPENDENT_AMBULATORY_CARE_PROVIDER_SITE_OTHER): Payer: Self-pay

## 2012-08-08 ENCOUNTER — Ambulatory Visit (INDEPENDENT_AMBULATORY_CARE_PROVIDER_SITE_OTHER): Payer: Medicare Other | Admitting: General Surgery

## 2012-08-08 VITALS — BP 126/80 | HR 80 | Temp 98.4°F | Resp 16 | Ht 65.0 in

## 2012-08-08 DIAGNOSIS — L0291 Cutaneous abscess, unspecified: Secondary | ICD-10-CM

## 2012-08-08 DIAGNOSIS — L0390 Cellulitis, unspecified: Secondary | ICD-10-CM

## 2012-08-08 NOTE — Progress Notes (Signed)
Subjective:     Patient ID: CASELYNN JAVAID, female   DOB: [birth year], 71 y.o.   MRN: [id number]  HPI Patient is 71 year old female followed by Dr. Maisie Fus' visit status post colectomy takedown. Patient underwent CT scan which revealed a subcutaneous abscess fluid collection with some minimal tracking to the intra-abdominal space below the left rectus. Patient states that she's been draining purulent fluid from the wound since her last visit.  Review of Systems  All other systems reviewed and are negative.       Objective:   Physical Exam  Abdominal:         Assessment:     Status post colostomy takedown a 71 year old female. Patient now with a subcutaneous abscess.    Plan:     I incised the area of drainage to make the orifice a larger and break up any loculations of purulence that could be expressed packing with 1 inch iodoform gauze.  -The patient is to continue her antibiotics. -Patient continued with dressing changes and packing wound twice a day. -Patient to followupx1 week with Dr. Andrey Campanile  for wound check.

## 2012-08-08 NOTE — Telephone Encounter (Signed)
[name] at [facility] called stating pt has come to their office requesting a different antibiotic than the one she was given by Dr Maisie Fus. I advised her that per Dr Ramirez's note today the pt was to continue the current antibiotic. [name] states she will review this with the pt and call if there is any questions or concerns.

## 2012-08-12 ENCOUNTER — Telehealth (INDEPENDENT_AMBULATORY_CARE_PROVIDER_SITE_OTHER): Payer: Self-pay | Admitting: General Surgery

## 2012-08-12 ENCOUNTER — Encounter (INDEPENDENT_AMBULATORY_CARE_PROVIDER_SITE_OTHER): Payer: Self-pay

## 2012-08-12 NOTE — Telephone Encounter (Signed)
Patient needs additional [employer] supplies. I explained that these requests always come by fax so we know exactly what she needs. She will make them aware to send fax.

## 2012-08-12 NOTE — Telephone Encounter (Signed)
Left message on machine for [facility] with [facility] to call back and ask for me.

## 2012-08-12 NOTE — Telephone Encounter (Signed)
Message copied by Liliana Cline on Tue Aug 12, 2012  9:45 AM ------      Message from: Larry Sierras      Created: Mon Aug 11, 2012  2:56 PM      Regarding: RX/SUPPLY      Contact: [phone]       PLEASE CALL [name] WITH [facility]. REQUESTING RX FOR SUPPLIES/GM

## 2012-08-15 ENCOUNTER — Encounter (INDEPENDENT_AMBULATORY_CARE_PROVIDER_SITE_OTHER): Payer: Self-pay | Admitting: General Surgery

## 2012-08-15 ENCOUNTER — Telehealth (INDEPENDENT_AMBULATORY_CARE_PROVIDER_SITE_OTHER): Payer: Self-pay

## 2012-08-15 ENCOUNTER — Ambulatory Visit (INDEPENDENT_AMBULATORY_CARE_PROVIDER_SITE_OTHER): Payer: Medicare Other | Admitting: General Surgery

## 2012-08-15 VITALS — BP 130/84 | HR 76 | Temp 98.2°F | Resp 18 | Ht 65.0 in | Wt 289.6 lb

## 2012-08-15 DIAGNOSIS — Z09 Encounter for follow-up examination after completed treatment for conditions other than malignant neoplasm: Secondary | ICD-10-CM

## 2012-08-15 NOTE — Telephone Encounter (Signed)
Spoke to [facility] rep. She took verbal order for CBC to be drawn on Monday 11/25 and to use calcium alginate on midline abdominal wound. She will let nurse know before going on on Monday.

## 2012-08-15 NOTE — Progress Notes (Signed)
Subjective:     Patient ID: [name], female   DOB: April 21, 1941, 71 y.o.   MRN: [id number]  HPI 71 year old Caucasian female comes in for a postop wound check. She underwent colostomy reversal and colectomy on September 3. She had a negative pressure wound VAC placed on her fascia to help close her subcutaneous space. However she developed several abscesses along her midline abdominal incision as well as at her old ostomy site. She most recently saw two of  my partners for wound check while out of town. They renewed her antibiotics and repeated his CT scan of her abdomen and pelvis. White blood cell count had decreased from 17-12. One of my partners opened her old ostomy site to help promote drainage. She states that she is doing well. She denies any fevers or chills. She denies any nausea or vomiting. He reports minimal drainage. They're currently packing the old ostomy site with packing strip. She denies any diarrhea or constipation.  Review of Systems     Objective:   Physical Exam BP 130/84  Pulse 76  Temp 98.2 F (36.8 C)  Resp 18  Ht 5\' 5"  (1.651 m)  Wt 289 lb 9.6 oz (131.362 kg)  BMI 48.19 kg/m2 Morbidly obese Caucasian female in no apparent distress Midline incision-no cellulitis. Large granulation bed. I cannot express any drainage. Left lower quadrant old ostomy site- still tracks about 6 cm deep and superior. There is a rim of granulation tissue. I cannot express any drainage. The fascia appears intact. No surrounding cellulitis or induration.    Assessment:     Status post colostomy reversal and colectomy Postoperative surgical site wound infection    Plan:     She is to continue her antibiotics. We will switch to calcium alginate for the midline. For the old ostomy site they are still to do wet to dry packing strips. We will repeat a CBC on Monday to determine whether or not we need to continue her antibiotic. Followup on December 4 with me for wound check  Michelle Sella.  Andrey Campanile, MD, FACS General, Bariatric, & Minimally Invasive Surgery [facility] Surgery, [place]

## 2012-08-15 NOTE — Patient Instructions (Signed)
We will have home health check another CBC on Monday Keep doing what you are doing with the wound

## 2012-08-19 ENCOUNTER — Other Ambulatory Visit (INDEPENDENT_AMBULATORY_CARE_PROVIDER_SITE_OTHER): Payer: Self-pay | Admitting: General Surgery

## 2012-08-19 ENCOUNTER — Telehealth (INDEPENDENT_AMBULATORY_CARE_PROVIDER_SITE_OTHER): Payer: Self-pay

## 2012-08-19 ENCOUNTER — Encounter (INDEPENDENT_AMBULATORY_CARE_PROVIDER_SITE_OTHER): Payer: Self-pay

## 2012-08-19 MED ORDER — METRONIDAZOLE 500 MG PO TABS: 500.0000 mg | ORAL_TABLET | Freq: Three times a day (TID) | ORAL | Status: DC

## 2012-08-19 MED ORDER — LEVOFLOXACIN 750 MG PO TABS: 750.0000 mg | ORAL_TABLET | Freq: Every day | ORAL | Status: DC

## 2012-08-19 NOTE — Telephone Encounter (Signed)
Patient aware per Dr Andrey Campanile that she should stay on antibiotics. Refill sent electronically, per Dr Andrey Campanile, to patient's pharmacy. Patient to have CBC with diff redrawn next week. Order faxed to [facility] at [phone] making them aware to draw CBC with diff. Patient questioned whether she should be having calcium alginate with silver dressing changes. I made her aware per Dr Tawana Scale office note it just says calcium alginate and I would let her know if anything changes with that. She will call with any problems and keep appt for next week.

## 2012-08-19 NOTE — Telephone Encounter (Signed)
The patient called to get her lab results and see if there is any instruction.  I gave her the wct and told her it was down from before.  I told her I will forward a message to [facility] and Dr Andrey Campanile to see if she needs to do anything else.  She has taken her last antibiotic pill today.

## 2012-08-19 NOTE — Progress Notes (Signed)
Labs reviewed. Wbc down to 11. Given complexity of abd wall abscesses will given 1 more week of abx. Pt has f/u appt with in about a week.   Mary Sella. Andrey Campanile, MD, FACS General, Bariatric, & Minimally Invasive Surgery [facility] Surgery, [place]

## 2012-08-25 ENCOUNTER — Telehealth (INDEPENDENT_AMBULATORY_CARE_PROVIDER_SITE_OTHER): Payer: Self-pay

## 2012-08-25 NOTE — Telephone Encounter (Signed)
[name] from [facility] called in stating proximal end of midline wound opened and is draining pus with foul odor. They stopped the calcium alginate dressings and went back to saline wet to dry dressing changes. She is scheduled for follow up Wed 12/4.

## 2012-08-26 ENCOUNTER — Encounter (INDEPENDENT_AMBULATORY_CARE_PROVIDER_SITE_OTHER): Payer: Self-pay

## 2012-08-27 ENCOUNTER — Ambulatory Visit (INDEPENDENT_AMBULATORY_CARE_PROVIDER_SITE_OTHER): Payer: Medicare Other | Admitting: General Surgery

## 2012-08-27 ENCOUNTER — Encounter (INDEPENDENT_AMBULATORY_CARE_PROVIDER_SITE_OTHER): Payer: Self-pay | Admitting: General Surgery

## 2012-08-27 VITALS — BP 107/62 | HR 84 | Temp 98.6°F | Ht 65.0 in | Wt 289.0 lb

## 2012-08-27 DIAGNOSIS — Z09 Encounter for follow-up examination after completed treatment for conditions other than malignant neoplasm: Secondary | ICD-10-CM

## 2012-08-27 NOTE — Progress Notes (Signed)
Subjective:     Patient ID: [name], female   DOB: 15-Jan-1941, 71 y.o.   MRN: [id number]  HPI 71 yo WF s/p colostomy takedown and completion sigmoid colectomy comes in for wound check. She developed several deep pockets of infection along midline incision and LLQ old ostomy. She has been getting daily dressing changes and on oral abx. Her most wbc on 12/2 was normal at 8.7. They report some spontaneous drainage of pus from upper aspect of midline incision last week. No Fever, chills, n/v/d/c. Good appetite  Review of Systems     Objective:   Physical Exam BP 107/62  Pulse 84  Temp 98.6 F (37 C) (Temporal)  Ht 5\' 5"  (1.651 m)  Wt 289 lb (131.09 kg)  BMI 48.09 kg/m2 Morbid obesity abd - soft, nt, nd. Ostomy site - no cellulitis. Opening about 1.5cm. Tracks about 4.5cm deep and medially. No pus on q tip- not as deep as at last visit. Midline - large bed of granulation tissue in lower midline- width about 5cm. Most proximal aspect of midline has a sinus-tracks about 5cm deep. No pus. No cellulitis    Assessment:     S/p colostomy takedown with completion sigmoid colectomy SSI    Plan:     Cont current wound care. Pack upper midline sinus and ostomy site with iodoform gauze. Moist gauze to lower midline.  F/u 4 weeks  Mary Sella. Andrey Campanile, MD, FACS General, Bariatric, & Minimally Invasive Surgery [facility] Surgery, [place]

## 2012-08-27 NOTE — Patient Instructions (Signed)
Continue current wound care regimen.

## 2012-09-15 ENCOUNTER — Telehealth (INDEPENDENT_AMBULATORY_CARE_PROVIDER_SITE_OTHER): Payer: Self-pay

## 2012-09-15 NOTE — Telephone Encounter (Signed)
Home Health RN, Byrd Hesselbach [phone]) , from [facility] calling to advise patient now has two new openings to midline.  First one is 2cm deep and the second on e is 1cm deep.  Positive purulent drainage, increased right sided abd pain, (was previously painfree), no fever.  Does patient need to be back on antibiotic?  [facility].

## 2012-09-15 NOTE — Telephone Encounter (Signed)
Per Dr. Carolynne Edouard, home health RN Byrd Hesselbach notified patient does not need antibiotics, instructed her to pack all open wounds with wet to dry dressing.

## 2012-09-25 ENCOUNTER — Ambulatory Visit (INDEPENDENT_AMBULATORY_CARE_PROVIDER_SITE_OTHER): Payer: Medicare Other | Admitting: General Surgery

## 2012-09-25 ENCOUNTER — Encounter (INDEPENDENT_AMBULATORY_CARE_PROVIDER_SITE_OTHER): Payer: Self-pay | Admitting: General Surgery

## 2012-09-25 VITALS — BP 126/84 | HR 72 | Temp 98.1°F | Resp 18 | Ht 65.0 in | Wt 292.0 lb

## 2012-09-25 DIAGNOSIS — Z09 Encounter for follow-up examination after completed treatment for conditions other than malignant neoplasm: Secondary | ICD-10-CM

## 2012-09-25 NOTE — Progress Notes (Signed)
Subjective:     Patient ID: [name], female   DOB: 1941/04/19, 72 y.o.   MRN: [id number]  HPI 72 year old Caucasian female comes in for another wound check. She underwent completion sigmoidectomy as well as colostomy reversal on October 3. I last saw her on December 4. She has had a very slow healing midline incision. She states that she has been doing well. 2 small areas opened up in the midline that her husband has been packing since her last visit. Otherwise she denies any fever, chills, nausea, vomiting, diarrhea or constipation. She denies any abdominal pain.  Review of Systems     Objective:   Physical Exam BP 126/84  Pulse 72  Temp 98.1 F (36.7 C) (Temporal)  Resp 18  Ht 5\' 5"  (1.651 m)  Wt 292 lb (132.45 kg)  BMI 48.59 kg/m2 Morbidly obese Caucasian female in no apparent distress Abdomen-soft, nontender, morbidly obese. She always has a completely healed midline incision. There is one area at the upper aspect of the incision that is still open. It is about half a centimeter in width. It tracks about 1-1/2 cm deep There is no surrounding cellulitis or induration. There is scant drainage on the old gauze. The old ostomy site appears to be almost completely healed. There is a small skin opening of about half a centimeter     Assessment:     Status post sigmoid colectomy and colostomy reversal with secondary wound healing to the midline incision    Plan:     Overall I think we're finally getting in traction on her midline incision. The lower part of the incision has completely granulated in. There is only 2 small areas that are requiring packing. I think these will continue to close up with additional time. They're encouraged to continue current wound care for now. Followup 2 months  Mary Sella. Andrey Campanile, MD, FACS General, Bariatric, & Minimally Invasive Surgery [facility] Surgery, [place]

## 2012-09-25 NOTE — Patient Instructions (Signed)
Continue current wound care

## 2012-09-29 ENCOUNTER — Telehealth (INDEPENDENT_AMBULATORY_CARE_PROVIDER_SITE_OTHER): Payer: Self-pay

## 2012-09-29 NOTE — Telephone Encounter (Signed)
Cipro 500mg  bid x 1 wk called to [facility].  Pt made aware and is expecting nurse to call her with an appointment sometime this week with Dr. Andrey Campanile.

## 2012-09-29 NOTE — Telephone Encounter (Signed)
Appt made 10/02/12 @ 2:45 pm. Tried to call patient and number busy. Will try later.

## 2012-09-29 NOTE — Telephone Encounter (Signed)
Pt has new blistered, red and draining area on her abdomen.  No fever.  Dr. Andrey Campanile was paged and ordered wet to dry packing continued.  Rx for Cipro 500mg  bid x 1 wk called to [facility] in [place].  Pt will need to be seen this week.

## 2012-09-29 NOTE — Telephone Encounter (Signed)
Patient made aware of appt.

## 2012-10-02 ENCOUNTER — Encounter (INDEPENDENT_AMBULATORY_CARE_PROVIDER_SITE_OTHER): Payer: Self-pay | Admitting: General Surgery

## 2012-10-02 ENCOUNTER — Ambulatory Visit (INDEPENDENT_AMBULATORY_CARE_PROVIDER_SITE_OTHER): Payer: Medicare Other | Admitting: General Surgery

## 2012-10-02 VITALS — BP 110/60 | HR 78 | Temp 97.2°F | Resp 18

## 2012-10-02 DIAGNOSIS — S31109A Unspecified open wound of abdominal wall, unspecified quadrant without penetration into peritoneal cavity, initial encounter: Secondary | ICD-10-CM

## 2012-10-02 NOTE — Progress Notes (Signed)
Subjective:     Patient ID: [name], female   DOB: October 17, 1940, 72 y.o.   MRN: [id number]  HPI 72 year old Caucasian female comes in for a wound check. I last saw in the office last week. However the home health nurse called stating that a new portion of her midline incision had opened up, started draining purulent fluid as well as having redness on her skin. She was started on ciprofloxacin. She denies any fevers or chills. She denies any nausea or vomiting. She denies any abdominal pain.  Review of Systems     Objective:   Physical Exam BP 110/60  Pulse 78  Temp 97.2 F (36.2 C) (Oral)  Resp 18 Nad, pleasant Obese abdomen, soft, nontender. No cellulitis. Almost healed left side ostomy site. Skin defect about 3mm long by 1.53mm deep.  Old midline incision has healed by secondary intention. At apex, small opening present, doesn't track. In lower midline, new opening about 5cm long by 3cm 1.5cm deep. Can express some yellowish fluid from under granulation tissue. No necrotic tissue.     Assessment:     Status post colostomy reversal and completion colectomy and placement of wound VAC 06/26/2012    Plan:     Postoperatively, the patient formed deep subcutaneous pockets of abscesses underneath granulation tissue. She has been on intermittent antibiotics. Because this has been ongoing I have recommended a repeat CT scan to see if there is any residual deep soft tissue pockets of abscesses. I believe if she has any undrained pockets of fluid they'll continue to cause her midline wound to be delayed in healing and continue to open up. Therefore we will obtain a CT scan of her abdomen pelvis. If she has persistent fluid collection to recommend percutaneous drain.  Continue current wound care. Finish up this abx prescription.   F/u 4 weeks.   Mary Sella. Andrey Campanile, MD, FACS General, Bariatric, & Minimally Invasive Surgery [facility] Surgery, [place]

## 2012-10-02 NOTE — Patient Instructions (Signed)
Pack new area of opening with moisten gauze or packing strip and cover with dry gauze Area can get wet in shower We will order a CT scan to look for deep residual pockets of fluid

## 2012-10-03 ENCOUNTER — Telehealth (INDEPENDENT_AMBULATORY_CARE_PROVIDER_SITE_OTHER): Payer: Self-pay | Admitting: General Surgery

## 2012-10-03 ENCOUNTER — Ambulatory Visit (HOSPITAL_COMMUNITY)
Admission: RE | Admit: 2012-10-03 | Discharge: 2012-10-03 | Disposition: A | Discharge status: Home / Self Care | Payer: Medicare Other | Source: Ambulatory Visit | Attending: General Surgery | Admitting: General Surgery

## 2012-10-03 DIAGNOSIS — T8189XA Other complications of procedures, not elsewhere classified, initial encounter: Secondary | ICD-10-CM | POA: Insufficient documentation

## 2012-10-03 DIAGNOSIS — S31109A Unspecified open wound of abdominal wall, unspecified quadrant without penetration into peritoneal cavity, initial encounter: Secondary | ICD-10-CM

## 2012-10-03 DIAGNOSIS — Y849 Medical procedure, unspecified as the cause of abnormal reaction of the patient, or of later complication, without mention of misadventure at the time of the procedure: Secondary | ICD-10-CM | POA: Insufficient documentation

## 2012-10-03 LAB — CREATININE, SERUM: GFR calc non Af Amer: 83 mL/min — ABNORMAL LOW (ref >90)

## 2012-10-03 MED ORDER — IOHEXOL 300 MG/ML  SOLN
100.0000 mL | Freq: Once | INTRAMUSCULAR | Status: AC | PRN
  Administered 2012-10-03: 100 mL via INTRAVENOUS

## 2012-10-03 NOTE — Telephone Encounter (Signed)
Called patient to discuss her CT scan from today. Overall it is significantly improved from her prior CT scan. There is still a small fluid collection underneath her old ostomy site. However it does not appear to be large enough for a percutaneous drain. I have recommended ongoing antibiotics. As well as continue current local wound therapy.

## 2012-10-06 ENCOUNTER — Telehealth (INDEPENDENT_AMBULATORY_CARE_PROVIDER_SITE_OTHER): Payer: Self-pay | Admitting: General Surgery

## 2012-10-06 NOTE — Telephone Encounter (Signed)
Message copied by Liliana Cline on Mon Oct 06, 2012  9:21 AM ------      Message from: Larry Sierras      Created: Mon Oct 06, 2012  8:41 AM      Regarding: ORDER      Contact: [phone]       [name] WITH [facility]/REQ ORDER/GM

## 2012-10-06 NOTE — Telephone Encounter (Signed)
Called [name]. They are doing weekly visits to continue to monitor open abdominal wound. I made her aware that sounded good and to call back if she needed anything else.

## 2012-10-14 ENCOUNTER — Encounter (INDEPENDENT_AMBULATORY_CARE_PROVIDER_SITE_OTHER): Payer: Self-pay

## 2012-11-03 ENCOUNTER — Encounter (INDEPENDENT_AMBULATORY_CARE_PROVIDER_SITE_OTHER): Payer: Self-pay | Admitting: General Surgery

## 2012-11-03 ENCOUNTER — Telehealth (INDEPENDENT_AMBULATORY_CARE_PROVIDER_SITE_OTHER): Payer: Self-pay | Admitting: General Surgery

## 2012-11-03 ENCOUNTER — Ambulatory Visit (INDEPENDENT_AMBULATORY_CARE_PROVIDER_SITE_OTHER): Payer: Medicare Other | Admitting: General Surgery

## 2012-11-03 VITALS — BP 138/76 | HR 76 | Temp 97.0°F | Resp 18

## 2012-11-03 DIAGNOSIS — L0291 Cutaneous abscess, unspecified: Secondary | ICD-10-CM

## 2012-11-03 DIAGNOSIS — L0390 Cellulitis, unspecified: Secondary | ICD-10-CM

## 2012-11-03 MED ORDER — CEPHALEXIN 500 MG PO CAPS: 500.0000 mg | ORAL_CAPSULE | Freq: Four times a day (QID) | ORAL | Status: DC

## 2012-11-03 NOTE — Patient Instructions (Signed)
Keep your apt with Dr Andrey Campanile later this month

## 2012-11-03 NOTE — Telephone Encounter (Signed)
Olegario Messier with [facility] called to report that incision line is red and 1-2 cm area around wound cavity appears red, with an increase of drainage. No fever reported. Appointment made for today with Dr. Clovis Pu in urgent office/gy

## 2012-11-03 NOTE — Progress Notes (Signed)
Subjective:   Patient ID: [name], female DOB: [birth year], 72 y.o. MRN: [id number]  HPI  72 year old Caucasian female comes in for a wound check. The home health nurse called stating that her midline incision was more erythematous. She denies any fevers or chills. She denies any nausea or vomiting. She denies any abdominal pain.    Review of Systems  Objective:   Physical Exam  Filed Vitals:   11/03/12 1555  BP: 138/76  Pulse: 76  Temp: 97 F (36.1 C)  Resp: 18    Nad, pleasant  Obese abdomen, soft, nontender.  Almost healed left side ostomy site. Skin defect about 3mm long by 1.64mm deep. No purulence noted.  Skin around upper scar is erythematous and slightly warm to touch.  According to husband, this is new. Old midline incision has healed by secondary intention. Assessment:   Status post colostomy reversal and completion colectomy and placement of wound VAC 06/26/2012  Plan:   Most recent CT shows no new fluid collection.  Cont to pack wound.  We will start her on a week of antibiotics.  She will keep her apt to see Dr Andrey Campanile in f/u.    Marland KitchenVanita Panda, MD [facility] Surgery, [place] [phone]   11/03/2012 4:22 PM

## 2012-11-10 ENCOUNTER — Telehealth (INDEPENDENT_AMBULATORY_CARE_PROVIDER_SITE_OTHER): Payer: Self-pay

## 2012-11-10 NOTE — Telephone Encounter (Signed)
The nurse called and states a pus pocket opened up on the pt near the breast bone at the top of the incision.  The husband said a lot of pus came out.  It is 3 cm deep.  It is nice and pink.  She has packed it with plain gauze but wants to know if you want it packed with Iodoform.  The wound looks good today.  She is on antibiotics.  She has no fever.  Follow up is next week.  Please call Olegario Messier.

## 2012-11-10 NOTE — Telephone Encounter (Signed)
Can use gauze as long it can reach the bottom of the wound to be packed. If not, then use iodoform

## 2012-11-10 NOTE — Telephone Encounter (Signed)
Olegario Messier called me back.  I told her Dr Andrey Campanile said use gauze if it will reach the bottom of the wound otherwise use iodoform. The wound is tiny.  She says they have iodoform there so I told her to use that.

## 2012-11-10 NOTE — Telephone Encounter (Signed)
I left a message for [name] to call so I could let her know what Dr Andrey Campanile said

## 2012-11-20 ENCOUNTER — Encounter (INDEPENDENT_AMBULATORY_CARE_PROVIDER_SITE_OTHER): Payer: Self-pay | Admitting: General Surgery

## 2012-11-20 ENCOUNTER — Ambulatory Visit (INDEPENDENT_AMBULATORY_CARE_PROVIDER_SITE_OTHER): Payer: Medicare Other | Admitting: General Surgery

## 2012-11-20 VITALS — BP 130/74 | HR 80 | Temp 97.4°F | Resp 20 | Ht 65.0 in

## 2012-11-20 NOTE — Patient Instructions (Signed)
Call if problem arises

## 2012-11-20 NOTE — Progress Notes (Signed)
Subjective:     Patient ID: [name], female   DOB: April 28, 1941, 72 y.o.   MRN: [id number]  HPI 72 year old Caucasian female comes in for followup after her colostomy reversal. She has had a prolonged problem with wound healing. She was last seen by me in January. She most recently saw Dr. Maisie Fus at the beginning of the month for some cellulitis around her upper midline incision. She was placed on oral antibiotics. She did report some spontaneous drainage from her incision however the drainage and redness has resolved. She denies any fever, chills, nausea, vomiting, diarrhea constipation. We did repeat her CT scan of her abdomen and pelvis in early to mid January which showed a significant improvement and decrease in the size of her deep subcutaneous pockets of fluid   PMHx, PSHx, SOCHx, FAMHx, ALL reviewed and unchanged  Review of Systems     Objective:   Physical Exam BP 130/74  Pulse 80  Temp(Src) 97.4 F (36.3 C) (Temporal)  Resp 20  Ht 5\' 5"  (1.651 m) Alert, nad Skin - no rash, jaundice Psych - alert, pleasant abd - obese, soft, nt, nd. Midline and LLQ incisions healed by secondary intention. No cellulitis. No fluctuance. No induration. (2) 2mm openings in midline. Depth 2mm. No drainage. No hernia    Assessment:     Status post colostomy reversal and completion sigmoid colectomy with postoperative deep subcutaneous abscesses     Plan:     This is the best her midline incision has looked. I cautioned her and her husband that she may have an area open back up in the future however I think were finally getting close to complete healing.  followup 3 month   Mary Sella. Andrey Campanile, MD, FACS General, Bariatric, & Minimally Invasive Surgery [facility] Surgery, [place]

## 2013-01-12 ENCOUNTER — Encounter (INDEPENDENT_AMBULATORY_CARE_PROVIDER_SITE_OTHER): Payer: Self-pay | Admitting: General Surgery

## 2013-01-12 ENCOUNTER — Telehealth (INDEPENDENT_AMBULATORY_CARE_PROVIDER_SITE_OTHER): Payer: Self-pay | Admitting: General Surgery

## 2013-01-12 ENCOUNTER — Telehealth (INDEPENDENT_AMBULATORY_CARE_PROVIDER_SITE_OTHER): Payer: Self-pay | Admitting: *Deleted

## 2013-01-12 ENCOUNTER — Encounter (INDEPENDENT_AMBULATORY_CARE_PROVIDER_SITE_OTHER): Payer: Self-pay | Admitting: *Deleted

## 2013-01-12 DIAGNOSIS — R238 Other skin changes: Secondary | ICD-10-CM

## 2013-01-12 NOTE — Telephone Encounter (Signed)
Spoke with patient. Aware we would like a CT scan and an order has been placed for this and given to our referral coordinator. Appt moved sooner to 01/27/2013 per Dr Andrey Campanile.

## 2013-01-12 NOTE — Telephone Encounter (Signed)
Patient states continued "infection coming out of the incision".  Patient asking if she needs to come in earlier than her current appt scheduled for May or if she should be on an antibiotic.

## 2013-01-12 NOTE — Telephone Encounter (Signed)
Spoke with patient she is aware of ct scan at [facility] on 01/15/13 at 12 for lab and scan at 1

## 2013-01-12 NOTE — Addendum Note (Signed)
Addended byLiliana Cline on: 01/12/2013 02:28 PM   Modules accepted: Orders

## 2013-01-15 ENCOUNTER — Ambulatory Visit (HOSPITAL_COMMUNITY)
Admission: RE | Admit: 2013-01-15 | Discharge: 2013-01-15 | Disposition: A | Discharge status: Home / Self Care | Payer: Medicare Other | Source: Ambulatory Visit | Attending: General Surgery | Admitting: General Surgery

## 2013-01-15 ENCOUNTER — Encounter (HOSPITAL_COMMUNITY): Payer: Self-pay

## 2013-01-15 DIAGNOSIS — E119 Type 2 diabetes mellitus without complications: Secondary | ICD-10-CM | POA: Insufficient documentation

## 2013-01-15 DIAGNOSIS — I10 Essential (primary) hypertension: Secondary | ICD-10-CM | POA: Insufficient documentation

## 2013-01-15 DIAGNOSIS — R238 Other skin changes: Secondary | ICD-10-CM

## 2013-01-15 DIAGNOSIS — Z9889 Other specified postprocedural states: Secondary | ICD-10-CM | POA: Insufficient documentation

## 2013-01-15 DIAGNOSIS — R109 Unspecified abdominal pain: Secondary | ICD-10-CM | POA: Insufficient documentation

## 2013-01-15 LAB — POCT I-STAT, CHEM 8
Calcium, Ion: 1.24 mmol/L (ref 1.13–1.30)
Chloride: 102 mEq/L (ref 96–112)
Glucose, Bld: 105 mg/dL — ABNORMAL HIGH (ref 70–99)
HCT: 38 % (ref 36.0–46.0)
TCO2: 31 mmol/L (ref 0–100)

## 2013-01-15 MED ORDER — IOHEXOL 300 MG/ML  SOLN
100.0000 mL | Freq: Once | INTRAMUSCULAR | Status: AC | PRN
  Administered 2013-01-15: 100 mL via INTRAVENOUS

## 2013-01-15 NOTE — Progress Notes (Signed)
Blood sample obtained from left arm IV for Creatnine level.  

## 2013-01-19 ENCOUNTER — Telehealth (INDEPENDENT_AMBULATORY_CARE_PROVIDER_SITE_OTHER): Payer: Self-pay | Admitting: General Surgery

## 2013-01-19 NOTE — Telephone Encounter (Signed)
Spoke with patient and made her aware the area under incisions has not changed since previous CT and does not show a deep abscess. She expressed frustration at continued problems. Made her aware we would discuss all of this at her follow up appt next week.

## 2013-01-19 NOTE — Telephone Encounter (Signed)
Message copied by Liliana Cline on Mon Jan 19, 2013  2:12 PM ------      Message from: [facility]      Created: Mon Jan 19, 2013  1:57 PM      Contact: [phone]       SHE CALLED NEEDING TO GET HER TEST RESULTS PLEASE CALL HER. ------

## 2013-01-27 ENCOUNTER — Ambulatory Visit (INDEPENDENT_AMBULATORY_CARE_PROVIDER_SITE_OTHER): Payer: Medicare Other | Admitting: General Surgery

## 2013-01-27 ENCOUNTER — Encounter (INDEPENDENT_AMBULATORY_CARE_PROVIDER_SITE_OTHER): Payer: Self-pay | Admitting: General Surgery

## 2013-01-27 DIAGNOSIS — T8189XD Other complications of procedures, not elsewhere classified, subsequent encounter: Secondary | ICD-10-CM

## 2013-01-27 DIAGNOSIS — K432 Incisional hernia without obstruction or gangrene: Secondary | ICD-10-CM

## 2013-01-27 DIAGNOSIS — T8189XA Other complications of procedures, not elsewhere classified, initial encounter: Secondary | ICD-10-CM

## 2013-01-27 MED ORDER — DOXYCYCLINE HYCLATE 50 MG PO CAPS: 100.0000 mg | ORAL_CAPSULE | Freq: Two times a day (BID) | ORAL | Status: DC

## 2013-01-27 NOTE — Progress Notes (Signed)
Subjective:     Patient ID: [name], female   DOB: [birth year], 72 y.o.   MRN: [id number]  HPI 72 year old Caucasian female comes in for followup after her colostomy reversal. She has had a prolonged problem with wound healing. She was last seen by me at the end of February. She called in about a week and a half ago complaining of 2 new areas of drainage from her midline incision. We performed a CT scan to evaluate the area. She did report some spontaneous drainage from her incision however the drainage and redness has resolved. She denies any fever, chills, nausea, vomiting, diarrhea constipation.   PMHx, PSHx, SOCHx, FAMHx, ALL reviewed and unchanged  Review of Systems     Objective:   Physical Exam There were no vitals taken for this visit. Alert, nad Skin - no rash, jaundice Psych - alert, pleasant Breast - Rt breast - large pendulous; at 7 o'clock  About 3cm area of cellulitis with open skin sinus. Scant yellowish drainage. No fluctuance. No masses abd - obese, soft, nt, nd. Midline and LLQ incisions healed by secondary intention. No cellulitis. No fluctuance. No induration. (2) 2mm openings in midline. Depth 3mm. No drainage.     Assessment:     Status post colostomy reversal and completion sigmoid colectomy with postoperative deep subcutaneous abscesses  Rt breast cellulitis    Plan:     I reviewed her CT scan. I informed her that she has formed an incisional hernia containing only fat. I explained that there is no deep pockets of abscess. My best recommendation to do with her ongoing intermittent midline wound issues was to treat it as needed. With her forming an incisional hernia I do not recommend going back to the OR and opening up the wound. Hopefully the sinuses will stop forming. With respect to her small incisional hernia we discussed the importance of weight loss. We discussed strategies to increase her exercise activity such as walking and using a pedometer and tracking  her daily step count. We discussed using a recumbent bicycle or doing water aerobics. We also discussed caloric intake.  With respect to her right breast cellulitis I gave her prescription for doxycycline. I also stressed her that she should get her annual mammogram I told her to call the office if this area worsens. Followup 3 months   Mary Sella. Andrey Campanile, MD, FACS General, Bariatric, & Minimally Invasive Surgery [facility], [place]

## 2013-01-27 NOTE — Patient Instructions (Signed)
Pick up antibiotic for breast cyst

## 2013-02-12 ENCOUNTER — Ambulatory Visit (INDEPENDENT_AMBULATORY_CARE_PROVIDER_SITE_OTHER): Payer: Medicare Other | Admitting: General Surgery

## 2013-03-14 ENCOUNTER — Emergency Department (HOSPITAL_COMMUNITY): Payer: Medicare Other

## 2013-03-14 ENCOUNTER — Encounter (HOSPITAL_COMMUNITY): Payer: Self-pay | Admitting: *Deleted

## 2013-03-14 ENCOUNTER — Inpatient Hospital Stay (HOSPITAL_COMMUNITY)
Admission: EM | Admit: 2013-03-14 | Discharge: 2013-03-18 | DRG: 603 | Disposition: A | Discharge status: Home / Self Care | Payer: Medicare Other | Attending: Internal Medicine | Admitting: Internal Medicine

## 2013-03-14 DIAGNOSIS — M7989 Other specified soft tissue disorders: Secondary | ICD-10-CM

## 2013-03-14 DIAGNOSIS — D509 Iron deficiency anemia, unspecified: Secondary | ICD-10-CM | POA: Diagnosis present

## 2013-03-14 DIAGNOSIS — L02419 Cutaneous abscess of limb, unspecified: Principal | ICD-10-CM | POA: Diagnosis present

## 2013-03-14 DIAGNOSIS — I10 Essential (primary) hypertension: Secondary | ICD-10-CM | POA: Diagnosis present

## 2013-03-14 DIAGNOSIS — E119 Type 2 diabetes mellitus without complications: Secondary | ICD-10-CM | POA: Diagnosis present

## 2013-03-14 DIAGNOSIS — L03115 Cellulitis of right lower limb: Secondary | ICD-10-CM

## 2013-03-14 DIAGNOSIS — E1149 Type 2 diabetes mellitus with other diabetic neurological complication: Secondary | ICD-10-CM | POA: Diagnosis present

## 2013-03-14 DIAGNOSIS — M129 Arthropathy, unspecified: Secondary | ICD-10-CM | POA: Diagnosis present

## 2013-03-14 DIAGNOSIS — Z933 Colostomy status: Secondary | ICD-10-CM

## 2013-03-14 DIAGNOSIS — K5730 Diverticulosis of large intestine without perforation or abscess without bleeding: Secondary | ICD-10-CM | POA: Diagnosis present

## 2013-03-14 DIAGNOSIS — K5720 Diverticulitis of large intestine with perforation and abscess without bleeding: Secondary | ICD-10-CM

## 2013-03-14 DIAGNOSIS — E1142 Type 2 diabetes mellitus with diabetic polyneuropathy: Secondary | ICD-10-CM | POA: Diagnosis present

## 2013-03-14 DIAGNOSIS — Z87891 Personal history of nicotine dependence: Secondary | ICD-10-CM

## 2013-03-14 DIAGNOSIS — E871 Hypo-osmolality and hyponatremia: Secondary | ICD-10-CM | POA: Diagnosis present

## 2013-03-14 DIAGNOSIS — Z9049 Acquired absence of other specified parts of digestive tract: Secondary | ICD-10-CM

## 2013-03-14 DIAGNOSIS — L0390 Cellulitis, unspecified: Secondary | ICD-10-CM

## 2013-03-14 DIAGNOSIS — M79609 Pain in unspecified limb: Secondary | ICD-10-CM

## 2013-03-14 DIAGNOSIS — Z6841 Body Mass Index (BMI) 40.0 and over, adult: Secondary | ICD-10-CM

## 2013-03-14 DIAGNOSIS — L03119 Cellulitis of unspecified part of limb: Principal | ICD-10-CM | POA: Diagnosis present

## 2013-03-14 DIAGNOSIS — Z7982 Long term (current) use of aspirin: Secondary | ICD-10-CM

## 2013-03-14 DIAGNOSIS — G4733 Obstructive sleep apnea (adult) (pediatric): Secondary | ICD-10-CM | POA: Diagnosis present

## 2013-03-14 DIAGNOSIS — E66813 Obesity, class 3: Secondary | ICD-10-CM | POA: Diagnosis present

## 2013-03-14 DIAGNOSIS — Z79899 Other long term (current) drug therapy: Secondary | ICD-10-CM

## 2013-03-14 LAB — URINALYSIS, ROUTINE W REFLEX MICROSCOPIC
Glucose, UA: NEGATIVE mg/dL
Hgb urine dipstick: NEGATIVE
Ketones, ur: NEGATIVE mg/dL
Leukocytes, UA: NEGATIVE
Protein, ur: NEGATIVE mg/dL
pH: 5.5 (ref 5.0–8.0)

## 2013-03-14 LAB — CBC WITH DIFFERENTIAL/PLATELET
Basophils Relative: 0 % (ref 0–1)
Eosinophils Absolute: 0.1 10*3/uL (ref 0.0–0.7)
MCH: 22.7 pg — ABNORMAL LOW (ref 26.0–34.0)
MCHC: 30.9 g/dL (ref 30.0–36.0)
Monocytes Relative: 7 % (ref 3–12)
Neutrophils Relative %: 84 % — ABNORMAL HIGH (ref 43–77)
Platelets: 271 10*3/uL (ref 150–400)
RDW: 18.7 % — ABNORMAL HIGH (ref 11.5–15.5)

## 2013-03-14 LAB — CREATININE, SERUM
Creatinine, Ser: 0.92 mg/dL (ref 0.50–1.10)
GFR calc Af Amer: 70 mL/min — ABNORMAL LOW (ref >90)
GFR calc non Af Amer: 60 mL/min — ABNORMAL LOW (ref >90)

## 2013-03-14 LAB — CBC
HCT: 34.5 % — ABNORMAL LOW (ref 36.0–46.0)
Hemoglobin: 10.4 g/dL — ABNORMAL LOW (ref 12.0–15.0)
RBC: 4.65 MIL/uL (ref 3.87–5.11)
WBC: 19.9 10*3/uL — ABNORMAL HIGH (ref 4.0–10.5)

## 2013-03-14 LAB — BASIC METABOLIC PANEL
BUN: 17 mg/dL (ref 6–23)
Potassium: 3.8 mEq/L (ref 3.5–5.1)
Sodium: 134 mEq/L — ABNORMAL LOW (ref 135–145)

## 2013-03-14 LAB — GLUCOSE, CAPILLARY: Glucose-Capillary: 192 mg/dL — ABNORMAL HIGH (ref 70–99)

## 2013-03-14 MED ORDER — ACETAMINOPHEN 325 MG PO TABS
650.0000 mg | ORAL_TABLET | Freq: Four times a day (QID) | ORAL | Status: DC | PRN
  Administered 2013-03-15: 650 mg via ORAL
  Filled 2013-03-14: qty 2

## 2013-03-14 MED ORDER — OXYCODONE-ACETAMINOPHEN 5-325 MG PO TABS
1.0000 | ORAL_TABLET | Freq: Four times a day (QID) | ORAL | Status: DC | PRN
  Administered 2013-03-14: 2 via ORAL
  Administered 2013-03-15: 1 via ORAL
  Filled 2013-03-14: qty 2
  Filled 2013-03-14: qty 1
  Filled 2013-03-14: qty 2

## 2013-03-14 MED ORDER — HYDROMORPHONE HCL PF 1 MG/ML IJ SOLN
1.0000 mg | Freq: Once | INTRAMUSCULAR | Status: AC
  Administered 2013-03-14: 1 mg via INTRAVENOUS
  Filled 2013-03-14: qty 1

## 2013-03-14 MED ORDER — ENOXAPARIN SODIUM 40 MG/0.4ML ~~LOC~~ SOLN
40.0000 mg | SUBCUTANEOUS | Status: DC
  Administered 2013-03-14: 40 mg via SUBCUTANEOUS
  Filled 2013-03-14 (×2): qty 0.4

## 2013-03-14 MED ORDER — ACETAMINOPHEN 650 MG RE SUPP: 650.0000 mg | Freq: Four times a day (QID) | RECTAL | Status: DC | PRN

## 2013-03-14 MED ORDER — HYDROMORPHONE HCL PF 1 MG/ML IJ SOLN
1.0000 mg | INTRAMUSCULAR | Status: AC | PRN
  Administered 2013-03-14 – 2013-03-15 (×2): 1 mg via INTRAVENOUS
  Filled 2013-03-14 (×2): qty 1

## 2013-03-14 MED ORDER — ONDANSETRON HCL 4 MG/2ML IJ SOLN
4.0000 mg | Freq: Three times a day (TID) | INTRAMUSCULAR | Status: AC | PRN
  Filled 2013-03-14: qty 2

## 2013-03-14 MED ORDER — ONDANSETRON HCL 4 MG/2ML IJ SOLN
4.0000 mg | Freq: Once | INTRAMUSCULAR | Status: AC
  Administered 2013-03-14: 4 mg via INTRAVENOUS
  Filled 2013-03-14: qty 2

## 2013-03-14 MED ORDER — CLINDAMYCIN PHOSPHATE 900 MG/50ML IV SOLN
900.0000 mg | Freq: Two times a day (BID) | INTRAVENOUS | Status: DC
  Administered 2013-03-14 – 2013-03-18 (×8): 900 mg via INTRAVENOUS
  Filled 2013-03-14 (×10): qty 50

## 2013-03-14 MED ORDER — CLINDAMYCIN PHOSPHATE 900 MG/50ML IV SOLN
900.0000 mg | Freq: Once | INTRAVENOUS | Status: AC
  Administered 2013-03-14: 900 mg via INTRAVENOUS
  Filled 2013-03-14: qty 50

## 2013-03-14 NOTE — ED Provider Notes (Signed)
Medical screening examination/treatment/procedure(s) were conducted as a shared visit with non-physician practitioner(s) and myself.  I personally evaluated the patient during the encounter  Pt with RLE erythema and warmth with leukocytosis and borderline temp. She is diabetic, will admit for treatment of cellulitis.   Brisa Auth B. Bernette Mayers, MD 03/14/13 1426

## 2013-03-14 NOTE — ED Provider Notes (Signed)
History     CSN: [id number]  Arrival date & time 03/14/13  1110   First MD Initiated Contact with Patient 03/14/13 1119      Chief Complaint  Patient presents with  . Knee Pain  . Leg Swelling    (Consider location/radiation/quality/duration/timing/severity/associated sxs/prior treatment) HPI Comments: Patient with h/o DM, open reduction and internal fixation of right lateral tibial plateau fracture of R leg 09/2003 performed by Dr. Eugenia Mcalpine -- presents with gradual onset of right lower extremity redness, pain, and swelling that became worse yesterday. Today she is unable to walk due to pain. She has had associated fevers and chills. No nausea or vomiting. No abdominal pain, diarrhea, urinary symptoms. Patient states that her diabetes is well-controlled and her last A1c was 6.1%. She takes Ultram at home for pain but no other treatments prior to arrival. She is concerned that she has a blood clot. Patient also mentions that she has nasal congestion and has to breathe through her mouth because of this. She denies shortness of breath or chest pain. Course is constant. Palpation and movement makes the pain worse. Nothing makes it better.  Patient is a 72 y.o. female presenting with knee pain. The history is provided by the patient.  Knee Pain Associated symptoms: fever     Past Medical History  Diagnosis Date  . Hypertension   . Diabetes mellitus   . Arthritis   . Anemia   . Diverticulitis   . Shortness of breath     with exertion   . Sleep apnea     cpap at 10     Past Surgical History  Procedure Laterality Date  . Appendectomy    . Back surgery    . Colon resection  10/09/2011    Procedure: COLON RESECTION;  Surgeon: Atilano Ina, MD;  Location: [facility] OR;  Service: General;  Laterality: N/A;  Colon Resection with colostomy  . Colostomy    . Jackson pratt    . Application of wound vac  wound vac  . Abdominal hysterectomy  1979  . Proctoscopy  06/26/2012    Procedure:  PROCTOSCOPY;  Surgeon: Atilano Ina, MD,FACS;  Location: [facility] ORS;  Service: General;  Laterality: N/A;  Rigid Proctoscopy  . Colon resection  06/26/2012    Procedure: COLON RESECTION;  Surgeon: Atilano Ina, MD,FACS;  Location: [facility] ORS;  Service: General;  Laterality: N/A;  . Application of wound vac  06/26/2012    Procedure: APPLICATION OF WOUND VAC;  Surgeon: Atilano Ina, MD,FACS;  Location: [facility] ORS;  Service: General;;    Family History  Problem Relation Age of Onset  . Heart disease Father   . Heart disease Brother     History  Substance Use Topics  . Smoking status: Former Smoker    Quit date: 09/25/2011  . Smokeless tobacco: Never Used  . Alcohol Use: No    OB History   Grav Para Term Preterm Abortions TAB SAB Ect Mult Living                  Review of Systems  Constitutional: Positive for fever and chills.  HENT: Positive for congestion and rhinorrhea. Negative for sore throat.   Eyes: Negative for redness.  Respiratory: Negative for cough.   Cardiovascular: Positive for leg swelling. Negative for chest pain.  Gastrointestinal: Negative for nausea, vomiting, abdominal pain and diarrhea.  Genitourinary: Negative for dysuria.  Musculoskeletal: Negative for myalgias.  Skin: Positive for color change and  rash.  Neurological: Negative for headaches.    Allergies  Adhesive; Codeine; Vancomycin; and Zosyn  Home Medications   Current Outpatient Rx  Name  Route  Sig  Dispense  Refill  . aspirin 81 MG tablet   Oral   Take 81 mg by mouth daily. Patient takes 3 daily         . CALCIUM-VITAMIN D PO   Oral   Take 1 tablet by mouth 2 (two) times daily. 600 d2         . Chromium-Cinnamon (CINNAMON PLUS CHROMIUM PO)   Oral   Take 2 capsules by mouth daily. 600mg          . ciprofloxacin (CIPRO) 500 MG tablet   Oral   Take 500 mg by mouth 2 (two) times daily.         Marland Kitchen doxycycline (VIBRAMYCIN) 50 MG capsule   Oral   Take 2 capsules (100 mg total) by mouth 2  (two) times daily.   20 capsule   0   . FLULAVAL injection               . furosemide (LASIX) 20 MG tablet      20 mg as needed.          Marland Kitchen levofloxacin (LEVAQUIN) 750 MG tablet   Oral   Take 1 tablet (750 mg total) by mouth daily.   7 tablet   0   . losartan (COZAAR) 100 MG tablet   Oral   Take 100 mg by mouth every morning.          . meloxicam (MOBIC) 15 MG tablet   Oral   Take 7.5 mg by mouth 2 (two) times daily.          . metFORMIN (GLUCOPHAGE) 500 MG tablet   Oral   Take 500 mg by mouth 2 (two) times daily before lunch and supper.          . Misc Natural Products (GLUCOSAMINE-CHONDROITIN PLUS PO)   Oral   Take 2 tablets by mouth daily. Patient takes one in am and one in pm         . montelukast (SINGULAIR) 10 MG tablet   Oral   Take 10 mg by mouth daily.          Marland Kitchen Phenylephrine HCl (VICKS SINEX NA)   Nasal   Place into the nose at bedtime.         . sitaGLIPtin (JANUVIA) 50 MG tablet   Oral   Take 50 mg by mouth every morning.          . traMADol (ULTRAM) 50 MG tablet   Oral   Take 50 mg by mouth at bedtime as needed.          . vitamin C (ASCORBIC ACID) 500 MG tablet   Oral   Take 500 mg by mouth daily.           BP 145/71  Pulse 95  Temp(Src) 99.1 F (37.3 C) (Oral)  Resp 20  SpO2 95%  Physical Exam  Nursing note and vitals reviewed. Constitutional: She appears well-developed and well-nourished.  HENT:  Head: Normocephalic and atraumatic.  Nose: Nose normal.  Mouth/Throat: Oropharynx is clear and moist.  Eyes: Conjunctivae are normal. Right eye exhibits no discharge. Left eye exhibits no discharge.  Neck: Normal range of motion. Neck supple.  Cardiovascular: Normal rate, regular rhythm and normal heart sounds.   Pulmonary/Chest: Breath sounds normal. No respiratory  distress. She has no wheezes. She has no rales.  Patient with some pursed lip breathing. She attributes this to nasal congestion.   Abdominal: Soft.  There is no tenderness. There is no rebound and no guarding.  Musculoskeletal:  2+ pitting edema R LE to knee with associated, poorly defined, erythema, and warmth. Mild tenderness to palpation. 1+ pitting edema L ankle. Normal ROM. Normal strength.   Neurological: She is alert.  Skin: Skin is warm and dry. There is erythema.  Psychiatric: She has a normal mood and affect.    ED Course  Procedures (including critical care time)  Labs Reviewed  CBC WITH DIFFERENTIAL - Abnormal; Notable for the following:    WBC 17.3 (*)    Hemoglobin 11.5 (*)    MCV 73.5 (*)    MCH 22.7 (*)    RDW 18.7 (*)    Neutrophils Relative % 84 (*)    Neutro Abs 14.5 (*)    Lymphocytes Relative 9 (*)    Monocytes Absolute 1.2 (*)    All other components within normal limits  BASIC METABOLIC PANEL - Abnormal; Notable for the following:    Sodium 134 (*)    Glucose, Bld 173 (*)    GFR calc non Af Amer 60 (*)    GFR calc Af Amer 70 (*)    All other components within normal limits  URINALYSIS, ROUTINE W REFLEX MICROSCOPIC   Dg Chest 2 View  03/14/2013   *RADIOLOGY REPORT*  Clinical Data: Hypoxia, lower extremity edema and shortness of breath.  CHEST - 2 VIEW  Comparison: 11/12/2011  Findings: Lung volumes are low bilaterally with bibasilar atelectasis present.  There is no evidence of pulmonary edema, focal consolidation or pleural fluid.  Heart size is within normal limits.  The aorta is mildly ectatic.  The bony thorax is unremarkable.  IMPRESSION: Low lung volumes with bibasilar atelectasis.   Original Report Authenticated By: Irish Lack, M.D.     1. Obesity, Class III, BMI 40-49.9 (morbid obesity)   2. Cellulitis of right lower extremity     11:31 AM Patient seen and examined.   Vital signs reviewed and are as follows: Filed Vitals:   03/14/13 1117  BP: 145/71  Pulse: 95  Temp: 99.1 F (37.3 C)  Resp: 20   12:03 PM Patient hypoxic into upper 80's. O2 started. Will check CXR.   2:17 PM  Spoke with Dr. Joseph Art who will see/admit. Pt informed of results. Seen with Dr. Bernette Mayers.   MDM  Cellulitis in diabetic, low-grade fevers at home. Do not suspect sepsis. Also hypoxic in ED without SOB. CXR without infiltrate. Doubt PE as doppler is negative. No tachycardia.         Renne Crigler, PA-C 03/14/13 1418

## 2013-03-14 NOTE — H&P (Signed)
[facility] History and Physical  [name] RUE:[id number] DOB: Aug 12, 1941 DOA: 03/14/2013  Referring physician:  PCP: Colette Ribas, MD  Specialists:  Chief Complaint: increasing right lower extremity swelling, pain  HPI: [name] is a 72 y.o. WF PMHx DM,HTN, diverticulitis,anemia sleep apnea (CPAP setting 12), open reduction and internal fixation of right lateral tibial plateau fracture of R leg 09/2003 performed by Dr. Eugenia Mcalpine, back surgery x2. States it three-day onset of right lower extremity swelling, increasing pain, increasing erythema. States started in her knee and radiate distal to include right foot and ankle. States unable to walk due to pain. Positive increased temperature measured at 99.7, positive chills and shakes, negative nausea vomiting. States that her diabetes is well-controlled and her last A1c was 6.1%. She takes Ultram at home for pain but no other treatments prior to arrival. She is concerned that she has a blood clot. Patient has obtainedright lower extremity venous Doppler with the following results;PRELIMINARY Right lower extremity venous duplex completed. Preliminary report: Right: No evidence of DVT, superficial thrombosis, or Baker's cyst. Doppler signals are pulsatile throughout the distal thigh to the ankle consistent with CHF or fluid overload.  Review of Systems: The patient denies anorexia, fever, weight loss,, vision loss, decreased hearing, hoarseness, chest pain, syncope, dyspnea on exertion, peripheral edema, balance deficits, hemoptysis, abdominal pain, melena, hematochezia, severe indigestion/heartburn, hematuria, incontinence, genital sores, muscle weakness, transient blindness, difficulty walking, depression, unusual weight change, abnormal bleeding, enlarged lymph nodes, angioedema, and breast masses.    Past Medical History  Diagnosis Date  . Hypertension   . Diabetes mellitus   . Arthritis   . Anemia   . Diverticulitis   .  Shortness of breath     with exertion   . Sleep apnea     cpap at 10    Past Surgical History  Procedure Laterality Date  . Appendectomy    . Back surgery  1970, 1978  . Colon resection, performed by Dr. Andrey Campanile  10/09/2011    Procedure: COLON RESECTION;  Surgeon: Atilano Ina, MD;  Location: [facility] OR;  Service: General;  Laterality: N/A;  Colon Resection with colostomy  . Colostomy    . Jackson pratt    . Application of wound vac  wound vac  . Abdominal hysterectomy  1979  . Proctoscopy  06/26/2012    Procedure: PROCTOSCOPY;  Surgeon: Atilano Ina, MD,FACS;  Location: [facility] ORS;  Service: General;  Laterality: N/A;  Rigid Proctoscopy  . Colon resection  06/26/2012    Procedure: COLON RESECTION;  Surgeon: Atilano Ina, MD,FACS;  Location: [facility];  Service: General;  Laterality: N/A;  . Application of wound vac  06/26/2012    Procedure: APPLICATION OF WOUND VAC;  Surgeon: Atilano Ina, MD,FACS;  Location: [facility] ORS;  Service: General;;   Social History:  reports that she quit smoking about 17 months ago. She has never used smokeless tobacco. She reports that she does not drink alcohol or use illicit drugs. where does patient live; home husband and patient does have home health Can patient participate in ADLS; Will need assistance secondary to multiple medical problems, morbid obesity  Allergies  Allergen Reactions  . Adhesive (Tape) Hives and Rash  . Codeine Itching  . Vancomycin Rash  . Zosyn (Piperacillin Sod-Tazobactam So) Rash    Family History  Problem Relation Age of Onset  . Heart disease Father   . Heart disease Brother   diabetes  Father, mother, brother, sister HLD       mother  Prior to Admission medications   Medication Sig Start Date End Date Taking? Authorizing Provider  aspirin 81 MG tablet Take 162 mg by mouth daily.    Yes Historical Provider, MD  CALCIUM-VITAMIN D PO Take 1 tablet by mouth 2 (two) times daily. 600 d2   Yes Historical Provider, MD   Chromium-Cinnamon (CINNAMON PLUS CHROMIUM PO) Take 2 capsules by mouth daily. 600mg    Yes Historical Provider, MD  furosemide (LASIX) 20 MG tablet 20 mg as needed.  01/09/12  Yes Historical Provider, MD  losartan (COZAAR) 100 MG tablet Take 100 mg by mouth every morning.    Yes Historical Provider, MD  meloxicam (MOBIC) 15 MG tablet Take 7.5 mg by mouth 2 (two) times daily.    Yes Historical Provider, MD  metFORMIN (GLUCOPHAGE) 500 MG tablet Take 500 mg by mouth 2 (two) times daily before lunch and supper.    Yes Historical Provider, MD  Misc Natural Products (GLUCOSAMINE-CHONDROITIN PLUS PO) Take 2 tablets by mouth daily. Patient takes one in am and one in pm   Yes Historical Provider, MD  montelukast (SINGULAIR) 10 MG tablet Take 10 mg by mouth daily.    Yes Historical Provider, MD  Phenylephrine HCl (VICKS SINEX NA) Place into the nose at bedtime.   Yes Historical Provider, MD  sitaGLIPtin (JANUVIA) 50 MG tablet Take 25 mg by mouth every morning.    Yes Historical Provider, MD  traMADol (ULTRAM) 50 MG tablet Take 50 mg by mouth at bedtime as needed.  01/09/12  Yes Historical Provider, MD  vitamin C (ASCORBIC ACID) 500 MG tablet Take 500 mg by mouth daily.   Yes Historical Provider, MD   Physical Exam: Filed Vitals:   03/14/13 1117 03/14/13 1245 03/14/13 1438 03/14/13 1445  BP: 145/71 146/57 114/44 124/62  Pulse: 95 94 92 92  Temp: 99.1 F (37.3 C)     TempSrc: Oral     Resp: 20     SpO2: 95% 96% 96% 97%     General:  Alert and oriented x4  Cardiovascular: regular rhythm and rate, negative murmurs rubs or gallops.  Respiratory: clear to auscultation bilaterally although secondary to patient's morbid mobility difficult to auscultate  Abdomen: morbidly obese, nontender, nondistended, plus bowel sounds  Skin: right lower extremity swelling, erythema, extending from right knee distally to encompass right foot and ankle    Labs on Admission:  Basic Metabolic Panel:  Recent  Labs Lab 03/14/13 1206  NA 134*  K 3.8  CL 97  CO2 26  GLUCOSE 173*  BUN 17  CREATININE 0.92  CALCIUM 9.2   Liver Function Tests: No results found for this basename: AST, ALT, ALKPHOS, BILITOT, PROT, ALBUMIN,  in the last 168 hours No results found for this basename: LIPASE, AMYLASE,  in the last 168 hours No results found for this basename: AMMONIA,  in the last 168 hours CBC:  Recent Labs Lab 03/14/13 1206  WBC 17.3*  NEUTROABS 14.5*  HGB 11.5*  HCT 37.2  MCV 73.5*  PLT 271   Cardiac Enzymes: No results found for this basename: CKTOTAL, CKMB, CKMBINDEX, TROPONINI,  in the last 168 hours  BNP (last 3 results) No results found for this basename: PROBNP,  in the last 8760 hours CBG: No results found for this basename: GLUCAP,  in the last 168 hours  Radiological Exams on Admission: Dg Chest 2 View  03/14/2013   *RADIOLOGY REPORT*  Clinical Data: Hypoxia, lower extremity edema and shortness of breath.  CHEST - 2 VIEW  Comparison: 11/12/2011  Findings: Lung volumes are low bilaterally with bibasilar atelectasis present.  There is no evidence of pulmonary edema, focal consolidation or pleural fluid.  Heart size is within normal limits.  The aorta is mildly ectatic.  The bony thorax is unremarkable.  IMPRESSION: Low lung volumes with bibasilar atelectasis.   Original Report Authenticated By: Irish Lack, M.D.    EKG: N./A.  Assessment/Plan Principal Problem:   Cellulitis Active Problems:   OBSTRUCTIVE SLEEP APNEA   Hypertension   Diabetes mellitus   Obesity, Class III, BMI 40-49.9 (morbid obesity)   1. Cellulitis;continue clindamycin 900 mg IV twice a day  2. Obstructive sleep apnea; patient will be placed back on her CPAP each bedtime at a setting of 12, heat and humidifier.  3.HTN; continue home regimen 4. Diabetes; continue home regimen.    Code Status:full (must indicate code status--if unknown or must be presumed, indicate so) Family Communication:  none Disposition Plan: estimate discharge in 2-3 days after patient's white blood cell count drops and right lower extremity shows improvement  Time spent:60 minutes  Drema Dallas [facility] Pager [phone]  If 7PM-7AM, please contact night-coverage [email or web address] Password [facility] 03/14/2013, 3:44 PM

## 2013-03-14 NOTE — Progress Notes (Signed)
VASCULAR LAB PRELIMINARY  PRELIMINARY  PRELIMINARY  PRELIMINARY  Right lower extremity venous duplex completed.    Preliminary report:  Right:  No evidence of DVT, superficial thrombosis, or Baker's cyst. Doppler signals are pulsatile throughout the distal thigh to the ankle consistent with CHF or fluid overload.  [name], RVS 03/14/2013, 12:58 PM

## 2013-03-14 NOTE — ED Notes (Signed)
Reports hx of fall and knee fx/surgery in 2004. Having right knee pain for past week but became more severe last night, unable to walk. Having swelling and redness to right lower leg, reports fever/chills.

## 2013-03-14 NOTE — Progress Notes (Signed)
Pt was setup on CPAP on previous shift with a nasal mask. Pt is still on CPAP resting comfortably. Pt is on Auto-CPAP with 4 LPM oxygen bleed in. Pt vitals are stable.

## 2013-03-15 ENCOUNTER — Inpatient Hospital Stay (HOSPITAL_COMMUNITY): Payer: Medicare Other

## 2013-03-15 DIAGNOSIS — L02419 Cutaneous abscess of limb, unspecified: Principal | ICD-10-CM

## 2013-03-15 DIAGNOSIS — I10 Essential (primary) hypertension: Secondary | ICD-10-CM

## 2013-03-15 DIAGNOSIS — E119 Type 2 diabetes mellitus without complications: Secondary | ICD-10-CM

## 2013-03-15 LAB — GLUCOSE, CAPILLARY: Glucose-Capillary: 168 mg/dL — ABNORMAL HIGH (ref 70–99)

## 2013-03-15 LAB — CBC WITH DIFFERENTIAL/PLATELET
Basophils Relative: 0 % (ref 0–1)
Eosinophils Absolute: 0.1 10*3/uL (ref 0.0–0.7)
HCT: 33.2 % — ABNORMAL LOW (ref 36.0–46.0)
Hemoglobin: 10 g/dL — ABNORMAL LOW (ref 12.0–15.0)
Lymphs Abs: 3.1 10*3/uL (ref 0.7–4.0)
MCH: 22.6 pg — ABNORMAL LOW (ref 26.0–34.0)
MCHC: 30.1 g/dL (ref 30.0–36.0)
Monocytes Absolute: 1.9 10*3/uL — ABNORMAL HIGH (ref 0.1–1.0)
Monocytes Relative: 10 % (ref 3–12)
Neutro Abs: 13.3 10*3/uL — ABNORMAL HIGH (ref 1.7–7.7)
Neutrophils Relative %: 72 % (ref 43–77)
RBC: 4.42 MIL/uL (ref 3.87–5.11)

## 2013-03-15 LAB — COMPREHENSIVE METABOLIC PANEL
ALT: 16 U/L (ref 0–35)
AST: 14 U/L (ref 0–37)
CO2: 27 mEq/L (ref 19–32)
Calcium: 8.2 mg/dL — ABNORMAL LOW (ref 8.4–10.5)
Creatinine, Ser: 0.92 mg/dL (ref 0.50–1.10)
GFR calc non Af Amer: 60 mL/min — ABNORMAL LOW (ref >90)
Sodium: 132 mEq/L — ABNORMAL LOW (ref 135–145)
Total Protein: 6.5 g/dL (ref 6.0–8.3)

## 2013-03-15 LAB — HEMOGLOBIN A1C
Hgb A1c MFr Bld: 6.7 % — ABNORMAL HIGH (ref <5.7)
Mean Plasma Glucose: 146 mg/dL — ABNORMAL HIGH (ref <117)

## 2013-03-15 MED ORDER — ENOXAPARIN SODIUM 80 MG/0.8ML ~~LOC~~ SOLN
75.0000 mg | SUBCUTANEOUS | Status: DC
  Administered 2013-03-15 – 2013-03-17 (×3): 75 mg via SUBCUTANEOUS
  Filled 2013-03-15 (×5): qty 0.8

## 2013-03-15 MED ORDER — ONDANSETRON HCL 4 MG/2ML IJ SOLN
4.0000 mg | Freq: Four times a day (QID) | INTRAMUSCULAR | Status: DC | PRN
  Administered 2013-03-15 (×2): 4 mg via INTRAVENOUS
  Filled 2013-03-15: qty 2

## 2013-03-15 MED ORDER — FLEET ENEMA 7-19 GM/118ML RE ENEM
1.0000 | ENEMA | Freq: Every day | RECTAL | Status: DC | PRN
  Administered 2013-03-16: 1 via RECTAL
  Filled 2013-03-15 (×2): qty 1

## 2013-03-15 MED ORDER — POLYETHYLENE GLYCOL 3350 17 G PO PACK
17.0000 g | PACK | Freq: Two times a day (BID) | ORAL | Status: DC
  Administered 2013-03-15 – 2013-03-18 (×7): 17 g via ORAL
  Filled 2013-03-15 (×8): qty 1

## 2013-03-15 MED ORDER — IOHEXOL 300 MG/ML  SOLN
80.0000 mL | Freq: Once | INTRAMUSCULAR | Status: AC | PRN
  Administered 2013-03-15: 80 mL via INTRAVENOUS

## 2013-03-15 NOTE — Progress Notes (Signed)
PATIENT DETAILS Name: [name] Age: 72 y.o. Sex: female Date of Birth: 02-28-1941 Admit Date: 03/14/2013 Admitting Physician Drema Dallas, MD WUJ:[id number], Chancy Hurter, MD  Subjective: Admitted with a 4 days hx of right knee/leg  Pain with erythema and swelling. Was unable to bear weight on the right leg from pain. Denies any falls or trauma.  Erythema better-but pain is essentially unchanged  Assessment/Plan: Principal Problem:   Right Leg Cellulitis -afebrile, but still with significant leukocytosis, claims erythema in the leg is better, but still has pain-which seems to be out of proportion to her cellulitis-claims she cannot even bear weight-does have hardware in the leg-therefore will do a CT scan of the right leg -Doppler was neg for DVT -c/w Clindamycin-day 2-has allergy to Vanco  Active Problems: HTN -currently BP controlled without use of any anti-hypertensive medications -resume Losartan when possibel  DM -Hold Metformin and Januvia for now -CBG's stable with SSI -A1C-6.7    OBSTRUCTIVE SLEEP APNEA -CPAP QHS    Obesity, Class III, BMI 40-49.9 (morbid obesity) -counseled regarding importance of weigh loss  Disposition: Remain inpatient  DVT Prophylaxis: Prophylactic Lovenox -may need adjustment of dosing-will ask pharmacy for assistance  Code Status: Full code   Family Communication None at bedside  Procedures:  None  CONSULTS:  None   MEDICATIONS: Scheduled Meds: . clindamycin (CLEOCIN) IV  900 mg Intravenous Q12H  . enoxaparin (LOVENOX) injection  40 mg Subcutaneous Q24H  . polyethylene glycol  17 g Oral BID   Continuous Infusions:  PRN Meds:.acetaminophen, acetaminophen, ondansetron (ZOFRAN) IV, oxyCODONE-acetaminophen, sodium phosphate  Antibiotics: Anti-infectives   Start     Dose/Rate Route Frequency Ordered Stop   03/14/13 2200  clindamycin (CLEOCIN) IVPB 900 mg     900 mg 100 mL/hr over 30 Minutes Intravenous Every 12 hours  03/14/13 1635     03/14/13 1215  clindamycin (CLEOCIN) IVPB 900 mg     900 mg 100 mL/hr over 30 Minutes Intravenous  Once 03/14/13 1201 03/14/13 1413       PHYSICAL EXAM: Vital signs in last 24 hours: Filed Vitals:   03/14/13 1548 03/14/13 1637 03/14/13 2135 03/15/13 0541  BP: 116/47 134/62 96/62 125/68  Pulse: 89 87 78 72  Temp:  98.8 F (37.1 C) 98.7 F (37.1 C) 98.6 F (37 C)  TempSrc:  Oral Oral Oral  Resp:  22 22 20   Height:  5\' 5"  (1.651 m)    Weight:  151 kg (332 lb 14.3 oz)    SpO2: 95% 93% 94% 95%    Weight change:  Filed Weights   03/14/13 1637  Weight: 151 kg (332 lb 14.3 oz)   Body mass index is 55.4 kg/(m^2).   Gen Exam: Awake and alert with clear speech.   Neck: Supple, No JVD.   Chest: B/L Clear.   CVS: S1 S2 Regular, no murmurs.  Abdomen: soft, BS +, non tender, non distended.  Extremities: Right Leg-erythema well within the marked area-erythema seems to be fading-however exquisitely tender. Leg is not tense and sensation is intact Neurologic: Non Focal.   Skin: No Rash.   Wounds: N/A.    Intake/Output from previous day:  Intake/Output Summary (Last 24 hours) at 03/15/13 0858 Last data filed at 03/15/13 0837  Gross per 24 hour  Intake    650 ml  Output      0 ml  Net    650 ml     LAB RESULTS: CBC  Recent Labs Lab 03/14/13 1206  03/14/13 1933 03/15/13 0445  WBC 17.3* 19.9* 18.5*  HGB 11.5* 10.4* 10.0*  HCT 37.2 34.5* 33.2*  PLT 271 265 269  MCV 73.5* 74.2* 75.1*  MCH 22.7* 22.4* 22.6*  MCHC 30.9 30.1 30.1  RDW 18.7* 18.9* 19.0*  LYMPHSABS 1.5  --  3.1  MONOABS 1.2*  --  1.9*  EOSABS 0.1  --  0.1  BASOSABS 0.0  --  0.1    Chemistries   Recent Labs Lab 03/14/13 1206 03/14/13 1933 03/15/13 0445  NA 134*  --  132*  K 3.8  --  3.8  CL 97  --  97  CO2 26  --  27  GLUCOSE 173*  --  172*  BUN 17  --  21  CREATININE 0.92 0.92 0.92  CALCIUM 9.2  --  8.2*    CBG:  Recent Labs Lab 03/14/13 2217  GLUCAP 192*     GFR Estimated Creatinine Clearance: 81.3 ml/min (by C-G formula based on Cr of 0.92).  Coagulation profile No results found for this basename: INR, PROTIME,  in the last 168 hours  Cardiac Enzymes No results found for this basename: CK, CKMB, TROPONINI, MYOGLOBIN,  in the last 168 hours  No components found with this basename: POCBNP,  No results found for this basename: DDIMER,  in the last 72 hours  Recent Labs  03/14/13 1933  HGBA1C 6.7*   No results found for this basename: CHOL, HDL, LDLCALC, TRIG, CHOLHDL, LDLDIRECT,  in the last 72 hours No results found for this basename: TSH, T4TOTAL, FREET3, T3FREE, THYROIDAB,  in the last 72 hours No results found for this basename: VITAMINB12, FOLATE, FERRITIN, TIBC, IRON, RETICCTPCT,  in the last 72 hours No results found for this basename: LIPASE, AMYLASE,  in the last 72 hours  Urine Studies No results found for this basename: UACOL, UAPR, USPG, UPH, UTP, UGL, UKET, UBIL, UHGB, UNIT, UROB, ULEU, UEPI, UWBC, URBC, UBAC, CAST, CRYS, UCOM, BILUA,  in the last 72 hours  MICROBIOLOGY: Recent Results (from the past 240 hour(s))  MRSA PCR SCREENING     Status: None   Collection Time    03/14/13  5:11 PM      Result Value Range Status   MRSA by PCR NEGATIVE  NEGATIVE Final   Comment:            The GeneXpert MRSA Assay (FDA     approved for NASAL specimens     only), is one component of a     comprehensive MRSA colonization     surveillance program. It is not     intended to diagnose MRSA     infection nor to guide or     monitor treatment for     MRSA infections.    RADIOLOGY STUDIES/RESULTS: Dg Chest 2 View  03/14/2013   *RADIOLOGY REPORT*  Clinical Data: Hypoxia, lower extremity edema and shortness of breath.  CHEST - 2 VIEW  Comparison: 11/12/2011  Findings: Lung volumes are low bilaterally with bibasilar atelectasis present.  There is no evidence of pulmonary edema, focal consolidation or pleural fluid.  Heart size is  within normal limits.  The aorta is mildly ectatic.  The bony thorax is unremarkable.  IMPRESSION: Low lung volumes with bibasilar atelectasis.   Original Report Authenticated By: Irish Lack, M.D.    Jeoffrey Massed, MD  [facility] Pager:[phone]  If 7PM-7AM, please contact night-coverage [email or web address] Password [facility] 03/15/2013, 8:58 AM   LOS: 1 day

## 2013-03-15 NOTE — Progress Notes (Signed)
Pt gave permission to use aromatherapy on eight leg cellulitis. Made lavender compresses with 6 drops of lavender in 16 oz of water with a little witch hazel. Placed 2 x 2 compresses on right leg from knee to foot for 1 hour. Will place 1 hour on and 1 hour off during day shift. Pt tolerating well.  Peter Congo RN

## 2013-03-15 NOTE — Progress Notes (Signed)
MD, patient requesting laxative. Last BM was 20th per patient.  Please address.  Will continue to monitor.

## 2013-03-15 NOTE — Progress Notes (Addendum)
ANTICOAGULATION CONSULT NOTE - Initial Consult  Pharmacy Consult for Lovenox Indication: VTE prophylaxis  Allergies  Allergen Reactions  . Adhesive (Tape) Hives and Rash  . Codeine Itching  . Vancomycin Rash  . Zosyn (Piperacillin Sod-Tazobactam So) Rash    Patient Measurements: Height: 5\' 5"  (165.1 cm) Weight: 332 lb 14.3 oz (151 kg) IBW/kg (Calculated) : 57   Vital Signs: Temp: 98.6 F (37 C) (06/22 0541) Temp src: Oral (06/22 0541) BP: 125/68 mmHg (06/22 0541) Pulse Rate: 72 (06/22 0541)  Labs:  Recent Labs  03/14/13 1206 03/14/13 1933 03/15/13 0445  HGB 11.5* 10.4* 10.0*  HCT 37.2 34.5* 33.2*  PLT 271 265 269  CREATININE 0.92 0.92 0.92    Estimated Creatinine Clearance: 81.3 ml/min (by C-G formula based on Cr of 0.92).   Medical History: Past Medical History  Diagnosis Date  . Hypertension   . Diabetes mellitus   . Arthritis   . Anemia   . Diverticulitis   . Shortness of breath     with exertion   . Sleep apnea     cpap at 10     Assessment: Patient with BMI of 55 which qualifies her for VTE prophylaxis dosing of 0.5mg /kg subQ q24 hr. Received 40mg  subq yesterday evening at ~2000. H/H is low, platelets WNL. No bleeding noted  Goal of Therapy:  Anti-Xa level 0.6-1.2 units/ml 4hrs after LMWH dose given Monitor platelets by anticoagulation protocol: Yes   Plan:  1. Will give Lovenox 75mg  subq Q24 hr starting with tonight's dose 2. Monitor CBC q72 hrs 3. Follow up any signs/symptoms of bleeding  Emiley Digiacomo D. Shalane Florendo, PharmD Clinical Pharmacist Pager: [phone] 03/15/2013 10:12 AM

## 2013-03-16 ENCOUNTER — Inpatient Hospital Stay (HOSPITAL_COMMUNITY): Payer: Medicare Other

## 2013-03-16 LAB — CBC
HCT: 34.6 % — ABNORMAL LOW (ref 36.0–46.0)
Hemoglobin: 10.3 g/dL — ABNORMAL LOW (ref 12.0–15.0)
RBC: 4.61 MIL/uL (ref 3.87–5.11)

## 2013-03-16 LAB — GLUCOSE, CAPILLARY
Glucose-Capillary: 117 mg/dL — ABNORMAL HIGH (ref 70–99)
Glucose-Capillary: 196 mg/dL — ABNORMAL HIGH (ref 70–99)

## 2013-03-16 LAB — BASIC METABOLIC PANEL
BUN: 19 mg/dL (ref 6–23)
CO2: 25 mEq/L (ref 19–32)
Glucose, Bld: 213 mg/dL — ABNORMAL HIGH (ref 70–99)
Potassium: 4.1 mEq/L (ref 3.5–5.1)
Sodium: 131 mEq/L — ABNORMAL LOW (ref 135–145)

## 2013-03-16 MED ORDER — ONDANSETRON HCL 4 MG/2ML IJ SOLN
4.0000 mg | INTRAMUSCULAR | Status: DC | PRN
  Administered 2013-03-16: 4 mg via INTRAVENOUS
  Filled 2013-03-16: qty 2

## 2013-03-16 MED ORDER — SENNOSIDES-DOCUSATE SODIUM 8.6-50 MG PO TABS
1.0000 | ORAL_TABLET | Freq: Two times a day (BID) | ORAL | Status: DC
  Administered 2013-03-16 – 2013-03-18 (×4): 1 via ORAL
  Filled 2013-03-16 (×3): qty 1

## 2013-03-16 MED ORDER — HYDROMORPHONE HCL 2 MG PO TABS
4.0000 mg | ORAL_TABLET | Freq: Four times a day (QID) | ORAL | Status: DC | PRN
  Administered 2013-03-16 – 2013-03-17 (×3): 4 mg via ORAL
  Filled 2013-03-16 (×4): qty 2

## 2013-03-16 MED ORDER — ZOLPIDEM TARTRATE 5 MG PO TABS
5.0000 mg | ORAL_TABLET | Freq: Every evening | ORAL | Status: DC | PRN
  Administered 2013-03-16 – 2013-03-17 (×3): 5 mg via ORAL
  Filled 2013-03-16 (×3): qty 1

## 2013-03-16 MED ORDER — FUROSEMIDE 40 MG PO TABS
40.0000 mg | ORAL_TABLET | Freq: Every day | ORAL | Status: DC
  Administered 2013-03-16 – 2013-03-18 (×3): 40 mg via ORAL
  Filled 2013-03-16 (×3): qty 1

## 2013-03-16 MED ORDER — ZOLPIDEM TARTRATE 5 MG PO TABS: 10.0000 mg | ORAL_TABLET | Freq: Every evening | ORAL | Status: DC | PRN

## 2013-03-16 MED ORDER — INSULIN ASPART 100 UNIT/ML ~~LOC~~ SOLN
0.0000 [IU] | Freq: Three times a day (TID) | SUBCUTANEOUS | Status: DC
  Administered 2013-03-16 (×2): 5 [IU] via SUBCUTANEOUS
  Administered 2013-03-17 (×2): 3 [IU] via SUBCUTANEOUS
  Administered 2013-03-17 – 2013-03-18 (×2): 2 [IU] via SUBCUTANEOUS
  Administered 2013-03-18: 5 [IU] via SUBCUTANEOUS

## 2013-03-16 NOTE — Progress Notes (Signed)
Pt places self on and off CPAP. Pt has a nasal CPAP mask with settings of 12 cmH2O. RT made pt aware that if she needed anything we were here all night.

## 2013-03-16 NOTE — Evaluation (Signed)
Physical Therapy Evaluation Patient Details Name: [name] MRN: [id number] DOB: [birth year] Today's Date: 03/16/2013 Time: 4098-1191 PT Time Calculation (min): 15 min  PT Assessment / Plan / Recommendation Clinical Impression  Pt admitted with diagnosis of cellulitis.  Pt reports she was very sedentary at home, used RW and lift chair, has CNA at home to assist with ADLs and meal prep.  Pt with little motivation to participate in therapy or get out of bed.  Eval limited due to pt refusal to attempt to stand or get out of bed.    PT Assessment  Patient needs continued PT services    Follow Up Recommendations  SNF    Does the patient have the potential to tolerate intense rehabilitation      Barriers to Discharge Inaccessible home environment stairs to enter home    Equipment Recommendations       Recommendations for Other Services OT consult   Frequency Min 3X/week    Precautions / Restrictions Restrictions Weight Bearing Restrictions: No   Pertinent Vitals/Pain C/o pain with light touch to R LE, eased when not being touched      Mobility  Bed Mobility Bed Mobility: Supine to Sit;Sit to Supine Supine to Sit: 3: Mod assist Sit to Supine: 2: Max assist Details for Bed Mobility Assistance: pt asks for help before attempting to perform mobility by herself, reports she sleeps in a lift chair at home so she does not perform bed mobility or sit to stands at home Transfers Transfers: Sit to Stand Sit to Stand: 1: +2 Total assist Details for Transfer Assistance: pt reports she uses lift chair at home and that husband assists her with in/out of car Ambulation/Gait General Gait Details: pt refused gait training today due to "being too tired"    Exercises     PT Diagnosis: Difficulty walking;Generalized weakness  PT Problem List: Decreased strength;Decreased activity tolerance;Decreased balance;Decreased mobility PT Treatment Interventions: DME instruction;Gait training;Stair  training;Functional mobility training;Therapeutic activities;Patient/family education;Neuromuscular re-education;Balance training;Therapeutic exercise;Wheelchair mobility training;Modalities   PT Goals Acute Rehab PT Goals PT Goal Formulation: With patient Time For Goal Achievement: 03/23/13 Potential to Achieve Goals: Fair Pt will go Supine/Side to Sit: with min assist Pt will Transfer Bed to Chair/Chair to Bed: with min assist Pt will Ambulate: 16 - 50 feet;with min assist Pt will Go Up / Down Stairs: 3-5 stairs;with min assist  Visit Information  Last PT Received On: 03/16/13 Assistance Needed: +2    Subjective Data  Subjective: I can't do this, I'm not getting out of bed, it will make me too tired   Prior Functioning  Home Living Lives With: Spouse Available Help at Discharge: Personal care attendant;Family Type of Home: House Home Access: Stairs to enter Entrance Stairs-Rails: Can reach both Home Layout: One level Home Adaptive Equipment: Walker - rolling Additional Comments: pt has lift chair which she sleeps in Prior Function Level of Independence: Needs assistance Needs Assistance: Bathing;Dressing;Meal Prep;Light Housekeeping Able to Take Stairs?: Yes Driving: Yes Communication Communication: No difficulties    Cognition  Cognition Arousal/Alertness: Awake/alert Behavior During Therapy: WFL for tasks assessed/performed Overall Cognitive Status: Within Functional Limits for tasks assessed    Extremity/Trunk Assessment Right Lower Extremity Assessment RLE ROM/Strength/Tone: Deficits RLE ROM/Strength/Tone Deficits: grossly 3-/5 RLE Sensation:  (increased sensitivity to light touch) RLE Coordination: WFL - gross/fine motor Left Lower Extremity Assessment LLE ROM/Strength/Tone: Deficits LLE ROM/Strength/Tone Deficits: grossly 3-/5 LLE Sensation:  (hypersensitive to light touch) LLE Coordination: WFL - gross/fine motor Trunk Assessment Trunk  Assessment:   (obese)   Balance Static Sitting Balance Static Sitting - Balance Support: Feet unsupported Static Sitting - Level of Assistance: 5: Stand by assistance Dynamic Sitting Balance Dynamic Sitting - Balance Support: During functional activity Dynamic Sitting - Level of Assistance: 5: Stand by assistance  End of Session PT - End of Session Activity Tolerance: Patient limited by fatigue Patient left: in bed;with call bell/phone within reach Nurse Communication: Mobility status  GP     DONAWERTH,KAREN 03/16/2013, 9:57 AM

## 2013-03-16 NOTE — Progress Notes (Signed)
PATIENT DETAILS Name: [name] Age: 72 y.o. Sex: female Date of Birth: 1941/06/13 Admit Date: 03/14/2013 Admitting Physician Drema Dallas, MD [id number], Chancy Hurter, MD  Subjective: Better-erythema almost resolved, pain the right leg better as well  Assessment/Plan: Principal Problem:   Right Leg Cellulitis -afebrile,clinically improved, Leukocytosis down trending -erythema in the right leg has almost resolved -CT Scan shows cellulitis without any abscess -Doppler was neg for DVT -c/w Clindamycin-day 3-has allergy to Vanco  Active Problems: HTN -currently BP controlled without use of any anti-hypertensive medications -resume Losartan when possible  DM -Hold Metformin and Januvia for now -CBG's stable with SSI -A1C-6.7    OBSTRUCTIVE SLEEP APNEA -CPAP QHS    Obesity, Class III, BMI 40-49.9 (morbid obesity) -counseled regarding importance of weight loss  Disposition: Remain inpatient-get PT eval to determine disposition  DVT Prophylaxis: Prophylactic Lovenox   Code Status: Full code   Family Communication None at bedside  Procedures:  None  CONSULTS:  None   MEDICATIONS: Scheduled Meds: . clindamycin (CLEOCIN) IV  900 mg Intravenous Q12H  . enoxaparin (LOVENOX) injection  75 mg Subcutaneous Q24H  . insulin aspart  0-15 Units Subcutaneous TID WC  . polyethylene glycol  17 g Oral BID   Continuous Infusions:  PRN Meds:.acetaminophen, acetaminophen, HYDROmorphone, ondansetron (ZOFRAN) IV, sodium phosphate, zolpidem  Antibiotics: Anti-infectives   Start     Dose/Rate Route Frequency Ordered Stop   03/14/13 2200  clindamycin (CLEOCIN) IVPB 900 mg     900 mg 100 mL/hr over 30 Minutes Intravenous Every 12 hours 03/14/13 1635     03/14/13 1215  clindamycin (CLEOCIN) IVPB 900 mg     900 mg 100 mL/hr over 30 Minutes Intravenous  Once 03/14/13 1201 03/14/13 1413       PHYSICAL EXAM: Vital signs in last 24 hours: Filed Vitals:   03/15/13 1454  03/15/13 2105 03/15/13 2335 03/16/13 0459  BP: 115/50 110/71  135/66  Pulse:  66 68 99  Temp:  98.5 F (36.9 C)  99.1 F (37.3 C)  TempSrc:  Oral  Oral  Resp:  20 18 20   Height:      Weight:      SpO2:  95%  92%    Weight change:  Filed Weights   03/14/13 1637  Weight: 151 kg (332 lb 14.3 oz)   Body mass index is 55.4 kg/(m^2).   Gen Exam: Awake and alert with clear speech.   Neck: Supple, No JVD.   Chest: B/L Clear.   CVS: S1 S2 Regular, no murmurs.  Abdomen: soft, BS +, non tender, non distended.  Extremities: Right Leg-erythema well within the marked area-has almost resolved. Leg is not tense and sensation is intact. Significantly less tender as well. Neurologic: Non Focal.   Skin: No Rash.   Wounds: N/A.    Intake/Output from previous day:  Intake/Output Summary (Last 24 hours) at 03/16/13 0848 Last data filed at 03/16/13 0600  Gross per 24 hour  Intake    290 ml  Output      0 ml  Net    290 ml     LAB RESULTS: CBC  Recent Labs Lab 03/14/13 1206 03/14/13 1933 03/15/13 0445 03/16/13 0530  WBC 17.3* 19.9* 18.5* 17.7*  HGB 11.5* 10.4* 10.0* 10.3*  HCT 37.2 34.5* 33.2* 34.6*  PLT 271 265 269 299  MCV 73.5* 74.2* 75.1* 75.1*  MCH 22.7* 22.4* 22.6* 22.3*  MCHC 30.9 30.1 30.1 29.8*  RDW 18.7* 18.9* 19.0* 18.6*  LYMPHSABS 1.5  --  3.1  --   MONOABS 1.2*  --  1.9*  --   EOSABS 0.1  --  0.1  --   BASOSABS 0.0  --  0.1  --     Chemistries   Recent Labs Lab 03/14/13 1206 03/14/13 1933 03/15/13 0445 03/16/13 0530  NA 134*  --  132* 131*  K 3.8  --  3.8 4.1  CL 97  --  97 96  CO2 26  --  27 25  GLUCOSE 173*  --  172* 213*  BUN 17  --  21 19  CREATININE 0.92 0.92 0.92 0.88  CALCIUM 9.2  --  8.2* 8.0*    CBG:  Recent Labs Lab 03/14/13 2217 03/15/13 1835 03/15/13 2109 03/16/13 0744  GLUCAP 192* 154* 168* 208*    GFR Estimated Creatinine Clearance: 85 ml/min (by C-G formula based on Cr of 0.88).  Coagulation profile No results found  for this basename: INR, PROTIME,  in the last 168 hours  Cardiac Enzymes No results found for this basename: CK, CKMB, TROPONINI, MYOGLOBIN,  in the last 168 hours  No components found with this basename: POCBNP,  No results found for this basename: DDIMER,  in the last 72 hours  Recent Labs  03/14/13 1933  HGBA1C 6.7*   No results found for this basename: CHOL, HDL, LDLCALC, TRIG, CHOLHDL, LDLDIRECT,  in the last 72 hours No results found for this basename: TSH, T4TOTAL, FREET3, T3FREE, THYROIDAB,  in the last 72 hours No results found for this basename: VITAMINB12, FOLATE, FERRITIN, TIBC, IRON, RETICCTPCT,  in the last 72 hours No results found for this basename: LIPASE, AMYLASE,  in the last 72 hours  Urine Studies No results found for this basename: UACOL, UAPR, USPG, UPH, UTP, UGL, UKET, UBIL, UHGB, UNIT, UROB, ULEU, UEPI, UWBC, URBC, UBAC, CAST, CRYS, UCOM, BILUA,  in the last 72 hours  MICROBIOLOGY: Recent Results (from the past 240 hour(s))  MRSA PCR SCREENING     Status: None   Collection Time    03/14/13  5:11 PM      Result Value Range Status   MRSA by PCR NEGATIVE  NEGATIVE Final   Comment:            The GeneXpert MRSA Assay (FDA     approved for NASAL specimens     only), is one component of a     comprehensive MRSA colonization     surveillance program. It is not     intended to diagnose MRSA     infection nor to guide or     monitor treatment for     MRSA infections.    RADIOLOGY STUDIES/RESULTS: Dg Chest 2 View  03/14/2013   *RADIOLOGY REPORT*  Clinical Data: Hypoxia, lower extremity edema and shortness of breath.  CHEST - 2 VIEW  Comparison: 11/12/2011  Findings: Lung volumes are low bilaterally with bibasilar atelectasis present.  There is no evidence of pulmonary edema, focal consolidation or pleural fluid.  Heart size is within normal limits.  The aorta is mildly ectatic.  The bony thorax is unremarkable.  IMPRESSION: Low lung volumes with bibasilar  atelectasis.   Original Report Authenticated By: Irish Lack, M.D.    Jeoffrey Massed, MD  [facility] Pager:[phone]  If 7PM-7AM, please contact night-coverage [email or web address] Password [facility] 03/16/2013, 8:48 AM   LOS: 2 days

## 2013-03-16 NOTE — Progress Notes (Signed)
Inpatient Diabetes Program Recommendations  AACE/ADA: New Consensus Statement on Inpatient Glycemic Control (2013)  Target Ranges:  Prepandial:   less than 140 mg/dL      Peak postprandial:   less than 180 mg/dL (1-2 hours)      Critically ill patients:  140 - 180 mg/dL   Reason for Visit: Results for [name], [name] (MRN [id number]) as of 03/16/2013 11:48  Ref. Range 03/14/2013 22:17 03/15/2013 18:35 03/15/2013 21:09 03/16/2013 07:44 03/16/2013 11:22  Glucose-Capillary Latest Range: 70-99 mg/dL 409 (H) 811 (H) 914 (H) 208 (H) 217 (H)   Consider adding Lantus 20 units daily (while oral medications on hold in the hospital).  Will follow.

## 2013-03-17 DIAGNOSIS — G4733 Obstructive sleep apnea (adult) (pediatric): Secondary | ICD-10-CM

## 2013-03-17 LAB — GLUCOSE, CAPILLARY
Glucose-Capillary: 144 mg/dL — ABNORMAL HIGH (ref 70–99)
Glucose-Capillary: 169 mg/dL — ABNORMAL HIGH (ref 70–99)

## 2013-03-17 MED ORDER — ALBUTEROL SULFATE (5 MG/ML) 0.5% IN NEBU: 2.5000 mg | INHALATION_SOLUTION | RESPIRATORY_TRACT | Status: DC | PRN

## 2013-03-17 MED ORDER — GUAIFENESIN ER 600 MG PO TB12
600.0000 mg | ORAL_TABLET | Freq: Two times a day (BID) | ORAL | Status: DC
  Administered 2013-03-17 – 2013-03-18 (×3): 600 mg via ORAL
  Filled 2013-03-17 (×5): qty 1

## 2013-03-17 MED ORDER — SENNOSIDES-DOCUSATE SODIUM 8.6-50 MG PO TABS
ORAL_TABLET | ORAL | Status: AC
  Filled 2013-03-17: qty 1

## 2013-03-17 MED ORDER — IPRATROPIUM BROMIDE 0.02 % IN SOLN
0.5000 mg | RESPIRATORY_TRACT | Status: DC
  Administered 2013-03-17 (×2): 0.5 mg via RESPIRATORY_TRACT
  Filled 2013-03-17 (×2): qty 2.5

## 2013-03-17 MED ORDER — ALBUTEROL SULFATE (5 MG/ML) 0.5% IN NEBU
2.5000 mg | INHALATION_SOLUTION | RESPIRATORY_TRACT | Status: DC
Start: 2013-03-17 — End: 2013-03-17
  Administered 2013-03-17 (×2): 2.5 mg via RESPIRATORY_TRACT
  Filled 2013-03-17 (×2): qty 0.5

## 2013-03-17 MED ORDER — AMLODIPINE BESYLATE 10 MG PO TABS: 10.0000 mg | ORAL_TABLET | Freq: Every day | ORAL | Status: DC

## 2013-03-17 NOTE — Progress Notes (Signed)
Physical Therapy Treatment Patient Details Name: [name] MRN: [id number] DOB: 27-Apr-1941 Today's Date: 03/17/2013 Time: 4098-1191 PT Time Calculation (min): 53 min  PT Assessment / Plan / Recommendation Comments on Treatment Session  Pt making good gains with mobility, able to ambulate with min assist and RW x 35'. Pt continues with significant deconditioning and dyspnea with activity, SpO2 down to 86% on 4L [place] O2 with bed mobility and transfer to bedside commode, back up to 94% with seated rest and 4L O2. Overall pt likely approaching baseline however she is a significant risk for falls due to decreased strength, endurance, balance in standing, and need for assist during functional mobility. Pt has steps to enter home and suspect these will be very difficult for pt. Continue to recommend SNF however uncertain if pt will agree to further rehab.     Follow Up Recommendations  SNF     Does the patient have the potential to tolerate intense rehabilitation   No  Barriers to Discharge  Steps to enter      Equipment Recommendations    3n1 if pt to D/C home   Recommendations for Other Services OT consult  Frequency Min 3X/week   Plan Discharge plan remains appropriate    Precautions / Restrictions Precautions Precautions: Fall Precaution Comments: Decreased endurance Restrictions Weight Bearing Restrictions: No   Pertinent Vitals/Pain Minimal Rt. LE pain, pt reports much improved from yesterday.    Mobility  Bed Mobility Bed Mobility: Supine to Sit;Sit to Supine Supine to Sit: 4: Min assist;HOB elevated;With rails Details for Bed Mobility Assistance: Pt reporting again that she sleeps in chair and does not have to do bed mobility.  Transfers Transfers: Sit to Stand;Stand to Sit Sit to Stand: 4: Min assist;From chair/3-in-1;From bed Stand to Sit: 4: Min assist Details for Transfer Assistance: Pt becomes short of breath with transitions.  Ambulation/Gait Ambulation/Gait  Assistance: 4: Min assist Ambulation Distance (Feet): 35 Feet Assistive device: Rolling walker Ambulation/Gait Assistance Details: Fatigues quickly and needs rest breaks. Uses a wide base of support to improve stability.  Gait Pattern: Step-to pattern;Trunk flexed;Wide base of support (min foot clearance) Stairs: No    Exercises General Exercises - Lower Extremity Ankle Circles/Pumps: AROM;Both;20 reps;Seated    PT Goals Acute Rehab PT Goals PT Goal Formulation: With patient Time For Goal Achievement: 03/23/13 Potential to Achieve Goals: Fair Pt will go Supine/Side to Sit: with min assist PT Goal: Supine/Side to Sit - Progress: Met Pt will Transfer Bed to Chair/Chair to Bed: with min assist PT Transfer Goal: Bed to Chair/Chair to Bed - Progress: Met Pt will Ambulate: 16 - 50 feet;with min assist PT Goal: Ambulate - Progress: Met Pt will Go Up / Down Stairs: 3-5 stairs;with min assist PT Goal: Up/Down Stairs - Progress: Progressing toward goal  Visit Information  Last PT Received On: 03/17/13 Assistance Needed: +1    Subjective Data  Subjective: I will try to get up Patient Stated Goal: Get home soon   Cognition  Cognition Arousal/Alertness: Awake/alert Behavior During Therapy: WFL for tasks assessed/performed Overall Cognitive Status: Within Functional Limits for tasks assessed       End of Session PT - End of Session Equipment Utilized During Treatment: Gait belt;Oxygen Activity Tolerance: Patient limited by fatigue Patient left: with call bell/phone within reach;in chair   GP     Wilhemina Bonito 03/17/2013, 12:16 PM

## 2013-03-17 NOTE — Consult Note (Signed)
Physical Medicine and Rehabilitation Consult Reason for Consult: Deconditioning/cellulitis Referring Physician: [facility]   HPI: [name] is a 72 y.o. right-handed female with history of hypertension, diabetes mellitus peripheral neuropathy and morbid obesity. By report patient is sedentary at home used a rolling walker and  a lift chair as well  as a CNA at home to assist with ADLs a meal preparation. Admitted 03/14/2013 with increasing right lower extremity swelling and pain. Venous Doppler studies lower extremities negative. Chest x-ray with no evidence of pulmonary edema. Suspect cellulitis lower extremity placed on antibiotic therapy. CT scan right lower extremity shows no acute abnormalities of the bone or muscle and findings were consistent with cellulitis. Maintained on subcutaneous Lovenox for DVT prophylaxis. Physical therapy evaluation completed 03/16/2013 and ongoing. M.D. has requested physical medicine rehabilitation consult to consider inpatient rehabilitation services   Review of Systems  Respiratory: Positive for shortness of breath.   Cardiovascular: Positive for leg swelling.  Musculoskeletal: Positive for myalgias and back pain.  Neurological: Positive for weakness.  All other systems reviewed and are negative.   Past Medical History  Diagnosis Date  . Hypertension   . Diabetes mellitus   . Arthritis   . Anemia   . Diverticulitis   . Shortness of breath     with exertion   . Sleep apnea     cpap at 10    Past Surgical History  Procedure Laterality Date  . Appendectomy    . Back surgery    . Colon resection  10/09/2011    Procedure: COLON RESECTION;  Surgeon: Atilano Ina, MD;  Location: [facility] OR;  Service: General;  Laterality: N/A;  Colon Resection with colostomy  . Colostomy    . Jackson pratt    . Application of wound vac  wound vac  . Abdominal hysterectomy  1979  . Proctoscopy  06/26/2012    Procedure: PROCTOSCOPY;  Surgeon: Atilano Ina, MD,FACS;   Location: [facility] ORS;  Service: General;  Laterality: N/A;  Rigid Proctoscopy  . Colon resection  06/26/2012    Procedure: COLON RESECTION;  Surgeon: Atilano Ina, MD,FACS;  Location: [facility] ORS;  Service: General;  Laterality: N/A;  . Application of wound vac  06/26/2012    Procedure: APPLICATION OF WOUND VAC;  Surgeon: Atilano Ina, MD,FACS;  Location: [facility] ORS;  Service: General;;   Family History  Problem Relation Age of Onset  . Heart disease Father   . Heart disease Brother    Social History:  reports that she quit smoking about 17 months ago. She has never used smokeless tobacco. She reports that she does not drink alcohol or use illicit drugs. Allergies:  Allergies  Allergen Reactions  . Adhesive (Tape) Hives and Rash  . Codeine Itching  . Vancomycin Rash  . Zosyn (Piperacillin Sod-Tazobactam So) Rash   Medications Prior to Admission  Medication Sig Dispense Refill  . aspirin 81 MG tablet Take 162 mg by mouth daily.       Marland Kitchen CALCIUM-VITAMIN D PO Take 1 tablet by mouth 2 (two) times daily. 600 d2      . Chromium-Cinnamon (CINNAMON PLUS CHROMIUM PO) Take 2 capsules by mouth daily. 600mg       . furosemide (LASIX) 20 MG tablet 20 mg as needed.       Marland Kitchen losartan (COZAAR) 100 MG tablet Take 100 mg by mouth every morning.       . meloxicam (MOBIC) 15 MG tablet Take 7.5 mg by mouth 2 (two)  times daily.       . metFORMIN (GLUCOPHAGE) 500 MG tablet Take 500 mg by mouth 2 (two) times daily before lunch and supper.       . Misc Natural Products (GLUCOSAMINE-CHONDROITIN PLUS PO) Take 2 tablets by mouth daily. Patient takes one in am and one in pm      . montelukast (SINGULAIR) 10 MG tablet Take 10 mg by mouth daily.       Marland Kitchen oxymetazoline (VICKS SINEX 12 HOUR DECONGEST) 0.05 % nasal spray Place 1-3 sprays into the nose 2 (two) times daily as needed for congestion.      Marland Kitchen Phenylephrine HCl (VICKS SINEX NA) Place into the nose at bedtime.      . sitaGLIPtin (JANUVIA) 50 MG tablet Take 25 mg by mouth  every morning.       . traMADol (ULTRAM) 50 MG tablet Take 50 mg by mouth at bedtime as needed.       . vitamin C (ASCORBIC ACID) 500 MG tablet Take 500 mg by mouth daily.        Home: Home Living Lives With: Spouse Available Help at Discharge: Personal care attendant;Family Type of Home: House Home Access: Stairs to enter Entrance Stairs-Rails: Can reach both Home Layout: One level Home Adaptive Equipment: Walker - rolling Additional Comments: pt has lift chair which she sleeps in  Functional History: Prior Function Able to Take Stairs?: Yes Driving: Yes Functional Status:  Mobility: Bed Mobility Bed Mobility: Supine to Sit;Sit to Supine Supine to Sit: 3: Mod assist Sit to Supine: 2: Max assist Transfers Transfers: Sit to Stand Sit to Stand: 1: +2 Total assist Ambulation/Gait General Gait Details: pt refused gait training today due to "being too tired"    ADL:    Cognition: Cognition Overall Cognitive Status: Within Functional Limits for tasks assessed Arousal/Alertness: Awake/alert Orientation Level: Oriented X4 Cognition Arousal/Alertness: Awake/alert Behavior During Therapy: WFL for tasks assessed/performed Overall Cognitive Status: Within Functional Limits for tasks assessed  Blood pressure 138/78, pulse 71, temperature 98.5 F (36.9 C), temperature source Oral, resp. rate 20, height 5\' 5"  (1.651 m), weight 151 kg (332 lb 14.3 oz), SpO2 90.00%. Physical Exam  Vitals reviewed. Constitutional: She is oriented to person, place, and time.  72 year old white morbidly obese female  HENT:  Head: Normocephalic.  Eyes: EOM are normal.  Neck: Normal range of motion. Neck supple. No thyromegaly present.  Cardiovascular: Normal rate and regular rhythm.   Pulmonary/Chest: Effort normal and breath sounds normal.  Abdominal: Bowel sounds are normal. She exhibits no distension.  Musculoskeletal:  +1 edema lower extremities  Neurological: She is alert and oriented to  person, place, and time.  Skin:  Ischemic changes lower extremities right greater than left  Psychiatric: She has a normal mood and affect.    Results for orders placed during the hospital encounter of 03/14/13 (from the past 24 hour(s))  GLUCOSE, CAPILLARY     Status: Abnormal   Collection Time    03/16/13 11:22 AM      Result Value Range   Glucose-Capillary 217 (*) 70 - 99 mg/dL   Comment 1 Documented in Chart     Comment 2 Notify RN    GLUCOSE, CAPILLARY     Status: Abnormal   Collection Time    03/16/13  5:08 PM      Result Value Range   Glucose-Capillary 117 (*) 70 - 99 mg/dL   Comment 1 Documented in Chart     Comment 2 Notify  RN    GLUCOSE, CAPILLARY     Status: Abnormal   Collection Time    03/16/13 10:00 PM      Result Value Range   Glucose-Capillary 196 (*) 70 - 99 mg/dL  GLUCOSE, CAPILLARY     Status: Abnormal   Collection Time    03/17/13  7:23 AM      Result Value Range   Glucose-Capillary 152 (*) 70 - 99 mg/dL   Ct Tibia Fibula Right W Contrast  03/15/2013   *RADIOLOGY REPORT*  Clinical Data: Right lower extremity pain and swelling.  CT OF THE RIGHT TIBIA AND FIBULA WITH CONTRAST  Contrast: 80mL OMNIPAQUE IOHEXOL 300 MG/ML  SOLN  Comparison: None.  Findings: There is circumferential subcutaneous soft tissue edema consistent with cellulitis.  There are no abscesses.  There is no evidence of fasciitis.   The muscles and bones of the right lower leg demonstrate no acute abnormalities.  Post-traumatic arthritic changes of the right knee with a small right knee effusion and slight arthritic changes of the dome of the talus are noted.  The vessels in the right lower leg appear normal.  The hardware in the proximal tibia and demonstrates no evidence of loosening.  IMPRESSION:  1.  Circumferential subcutaneous edema in the right lower leg without evidence of abscess or deep space infection. The finding is consistent with cellulitis. 2.  No acute abnormality of the bones or  muscles.   Original Report Authenticated By: Francene Boyers, M.D.   Dg Chest Port 1 View  03/16/2013   *RADIOLOGY REPORT*  Clinical Data: Short of breath.  PORTABLE CHEST - 1 VIEW  Comparison: 03/14/2013.  Findings: Cardiomegaly and pulmonary vascular congestion.  Lung volumes appears similar to the prior exam.  No airspace disease. No pleural effusion.  IMPRESSION: Cardiomegaly and pulmonary vascular congestion.   Original Report Authenticated By: Andreas Newport, M.D.    Assessment/Plan: Diagnosis: cellulitis, morbid obesity   RECOMMENDATIONS: This patient's condition is appropriate for continued rehabilitative care in the following setting: [facility] or SNF Patient has agreed to participate in recommended program. Potentially Note that insurance prior authorization may be required for reimbursement for recommended care.  Comment: Pt nearing baseline. Home with [facility] when medically appropriate. SNF if family can't manage.    03/17/2013

## 2013-03-17 NOTE — Progress Notes (Signed)
Patient is not a candidate for inpt rehab at this time. Recommend home with [facility] or SNF if family unable to provide needed assistance at home. I have alerted RN CM, Letha Cape. [phone]

## 2013-03-17 NOTE — Clinical Documentation Improvement (Signed)
GENERIC DOCUMENTATION CLARIFICATION QUERY  THIS DOCUMENT IS NOT A PERMANENT PART OF THE MEDICAL RECORD  TO RESPOND TO THE THIS QUERY, FOLLOW THE INSTRUCTIONS BELOW:  1. If needed, update documentation for the patient's encounter via the notes activity.  2. Access this query again and click edit on the In [employer].  3. After updating, or not, click F2 to complete all highlighted (required) fields concerning your review. Select "additional documentation in the medical record" OR "no additional documentation provided".  4. Click Sign note button.  5. The deficiency will fall out of your In Basket *Please let us know if you are not able to complete this workflow by phone or e-mail (listed below).  Please update your documentation within the medical record to reflect your response to this query.                                                                                        03/17/13   To Clydia Llano,  MD  / Associates,  In a better effort to capture your patient's severity of illness, reflect appropriate length of stay and utilization of resources, a review of the patient medical record has revealed the following indicators.    Based on your clinical judgment, please clarify and document in a progress note and/or discharge summary the clinical condition associated with the following supporting information:  In responding to this query please exercise your independent judgment.  The fact that a query is asked, does not imply that any particular answer is desired or expected.  Noted patient has OSA on CPAP with a BMI of 55.4,  would either following diagnosis be appropriate? Thank you    Possible Clinical Conditions?  Obesity hyperventilation Syndrome  Other Condition  Cannot Clinically Determine    Treatment: continued home cpap order   You may use possible, probable, or suspect with inpatient documentation. possible, probable, suspected diagnoses MUST be documented at  the time of discharge  Reviewed:  no additional documentation provided  Cannot Clinically Determine  Thank Arrie Eastern  Clinical Documentation Specialist: Pager [phone] Phone  [phone]  Health Information Management [place]

## 2013-03-17 NOTE — Progress Notes (Signed)
   CARE MANAGEMENT NOTE 03/17/2013  Patient:  [name], [name]   Account Number:  [id number]  Date Initiated:  03/16/2013  Documentation initiated by:  [facility]  Subjective/Objective Assessment:   cellulitis     Action/Plan:   lives at home with husband   Anticipated DC Date:  03/18/2013   Anticipated DC Plan:  HOME W HOME HEALTH SERVICES      DC Planning Services  CM consult      [facility] Choice  HOME HEALTH   Choice offered to / List presented to:  C-1 Patient        HH arranged  HH-2 PT  HH-1 RN      [facility] agency  [facility]   Status of service:  In process, will continue to follow Medicare Important Message given?   (If response is "NO", the following Medicare IM given date fields will be blank) Date Medicare IM given:   Date Additional Medicare IM given:    Discharge Disposition:    Per UR Regulation:    If discussed at Long Length of Stay Meetings, dates discussed:    Comments:  03/17/2013 1500 NCM spoke to pt and states she does want IP rehab but not SNF. States she had aide through her LTC policy. She had aide that comes from 8-4 pm/7 days a week. States she had [facility] in the past. Will request [facility] RN/PT at dc for follow up at home if not appropriate for CIR. Isidoro Donning RN CCM Case Mgmt phone [phone]

## 2013-03-17 NOTE — Progress Notes (Signed)
PATIENT DETAILS Name: [name] Age: 72 y.o. Sex: female Date of Birth: 02/06/1941 Admit Date: 03/14/2013 Admitting Physician Drema Dallas, MD WUX:[id number], Chancy Hurter, MD  Subjective: Erythema to right leg has almost resolved, the pain in the right leg is significantly better  Assessment/Plan: Principal Problem:   Right Leg Cellulitis -afebrile,clinically improved, Leukocytosis down trending -erythema in the right leg has almost resolved, pain down significantly as well -CT Scan shows cellulitis without any abscess -Doppler was neg for DVT -c/w Clindamycin-day 4-has allergy to Vanco-will complete a total of 7-10 days of treatment and then stop  Active Problems: HTN -currently BP controlled without use of any anti-hypertensive medications -resume Losartan when possible  DM -Hold Metformin and Januvia for now -CBG's stable with SSI -A1C-6.7    OBSTRUCTIVE SLEEP APNEA -CPAP QHS    Obesity, Class III, BMI 40-49.9 (morbid obesity) -counseled regarding importance of weight loss  Disposition: Remain inpatient-SNF, patient requesting CIR eval  DVT Prophylaxis: Prophylactic Lovenox   Code Status: Full code   Family Communication None at bedside  Procedures:  None  CONSULTS:  None   MEDICATIONS: Scheduled Meds: . ipratropium  0.5 mg Nebulization Q4H   And  . albuterol  2.5 mg Nebulization Q4H  . clindamycin (CLEOCIN) IV  900 mg Intravenous Q12H  . enoxaparin (LOVENOX) injection  75 mg Subcutaneous Q24H  . furosemide  40 mg Oral Daily  . guaiFENesin  600 mg Oral BID  . insulin aspart  0-15 Units Subcutaneous TID WC  . polyethylene glycol  17 g Oral BID  . senna-docusate  1 tablet Oral BID   Continuous Infusions:  PRN Meds:.acetaminophen, acetaminophen, HYDROmorphone, ondansetron (ZOFRAN) IV, sodium phosphate, zolpidem  Antibiotics: Anti-infectives   Start     Dose/Rate Route Frequency Ordered Stop   03/14/13 2200  clindamycin (CLEOCIN) IVPB 900 mg      900 mg 100 mL/hr over 30 Minutes Intravenous Every 12 hours 03/14/13 1635     03/14/13 1215  clindamycin (CLEOCIN) IVPB 900 mg     900 mg 100 mL/hr over 30 Minutes Intravenous  Once 03/14/13 1201 03/14/13 1413       PHYSICAL EXAM: Vital signs in last 24 hours: Filed Vitals:   03/16/13 1500 03/16/13 2116 03/17/13 0321 03/17/13 0629  BP: 138/73 137/82  138/78  Pulse: 66 74  71  Temp: 97.6 F (36.4 C) 98.2 F (36.8 C) 97.9 F (36.6 C) 98.5 F (36.9 C)  TempSrc: Oral Oral  Oral  Resp: 22 19  20   Height:      Weight:      SpO2: 94% 93%  90%    Weight change:  Filed Weights   03/14/13 1637  Weight: 151 kg (332 lb 14.3 oz)   Body mass index is 55.4 kg/(m^2).   Gen Exam: Awake and alert with clear speech.   Neck: Supple, No JVD.   Chest: B/L Clear.   CVS: S1 S2 Regular, no murmurs.  Abdomen: soft, BS +, non tender, non distended.  Extremities: Right Leg-erythema well within the marked area-has almost resolved. Leg is not tense and sensation is intact. Significantly less tender as well. Neurologic: Non Focal.   Skin: No Rash.   Wounds: N/A.    Intake/Output from previous day:  Intake/Output Summary (Last 24 hours) at 03/17/13 0934 Last data filed at 03/17/13 0833  Gross per 24 hour  Intake    358 ml  Output    400 ml  Net    -42 ml  LAB RESULTS: CBC  Recent Labs Lab 03/14/13 1206 03/14/13 1933 03/15/13 0445 03/16/13 0530  WBC 17.3* 19.9* 18.5* 17.7*  HGB 11.5* 10.4* 10.0* 10.3*  HCT 37.2 34.5* 33.2* 34.6*  PLT 271 265 269 299  MCV 73.5* 74.2* 75.1* 75.1*  MCH 22.7* 22.4* 22.6* 22.3*  MCHC 30.9 30.1 30.1 29.8*  RDW 18.7* 18.9* 19.0* 18.6*  LYMPHSABS 1.5  --  3.1  --   MONOABS 1.2*  --  1.9*  --   EOSABS 0.1  --  0.1  --   BASOSABS 0.0  --  0.1  --     Chemistries   Recent Labs Lab 03/14/13 1206 03/14/13 1933 03/15/13 0445 03/16/13 0530  NA 134*  --  132* 131*  K 3.8  --  3.8 4.1  CL 97  --  97 96  CO2 26  --  27 25  GLUCOSE 173*   --  172* 213*  BUN 17  --  21 19  CREATININE 0.92 0.92 0.92 0.88  CALCIUM 9.2  --  8.2* 8.0*    CBG:  Recent Labs Lab 03/16/13 0744 03/16/13 1122 03/16/13 1708 03/16/13 2200 03/17/13 0723  GLUCAP 208* 217* 117* 196* 152*    GFR Estimated Creatinine Clearance: 85 ml/min (by C-G formula based on Cr of 0.88).  Coagulation profile No results found for this basename: INR, PROTIME,  in the last 168 hours  Cardiac Enzymes No results found for this basename: CK, CKMB, TROPONINI, MYOGLOBIN,  in the last 168 hours  No components found with this basename: POCBNP,  No results found for this basename: DDIMER,  in the last 72 hours  Recent Labs  03/14/13 1933  HGBA1C 6.7*   No results found for this basename: CHOL, HDL, LDLCALC, TRIG, CHOLHDL, LDLDIRECT,  in the last 72 hours No results found for this basename: TSH, T4TOTAL, FREET3, T3FREE, THYROIDAB,  in the last 72 hours No results found for this basename: VITAMINB12, FOLATE, FERRITIN, TIBC, IRON, RETICCTPCT,  in the last 72 hours No results found for this basename: LIPASE, AMYLASE,  in the last 72 hours  Urine Studies No results found for this basename: UACOL, UAPR, USPG, UPH, UTP, UGL, UKET, UBIL, UHGB, UNIT, UROB, ULEU, UEPI, UWBC, URBC, UBAC, CAST, CRYS, UCOM, BILUA,  in the last 72 hours  MICROBIOLOGY: Recent Results (from the past 240 hour(s))  MRSA PCR SCREENING     Status: None   Collection Time    03/14/13  5:11 PM      Result Value Range Status   MRSA by PCR NEGATIVE  NEGATIVE Final   Comment:            The GeneXpert MRSA Assay (FDA     approved for NASAL specimens     only), is one component of a     comprehensive MRSA colonization     surveillance program. It is not     intended to diagnose MRSA     infection nor to guide or     monitor treatment for     MRSA infections.    RADIOLOGY STUDIES/RESULTS: Dg Chest 2 View  03/14/2013   *RADIOLOGY REPORT*  Clinical Data: Hypoxia, lower extremity edema and  shortness of breath.  CHEST - 2 VIEW  Comparison: 11/12/2011  Findings: Lung volumes are low bilaterally with bibasilar atelectasis present.  There is no evidence of pulmonary edema, focal consolidation or pleural fluid.  Heart size is within normal limits.  The aorta is mildly ectatic.  The bony thorax is unremarkable.  IMPRESSION: Low lung volumes with bibasilar atelectasis.   Original Report Authenticated By: Irish Lack, M.D.    Jeoffrey Massed, MD  [facility] Pager:[phone]  If 7PM-7AM, please contact night-coverage [email or web address] Password [facility] 03/17/2013, 9:34 AM   LOS: 3 days

## 2013-03-18 LAB — CBC
Hemoglobin: 10.2 g/dL — ABNORMAL LOW (ref 12.0–15.0)
MCH: 22.3 pg — ABNORMAL LOW (ref 26.0–34.0)
Platelets: 306 10*3/uL (ref 150–400)
RBC: 4.57 MIL/uL (ref 3.87–5.11)
WBC: 15.5 10*3/uL — ABNORMAL HIGH (ref 4.0–10.5)

## 2013-03-18 MED ORDER — CLINDAMYCIN HCL 150 MG PO CAPS: 450.0000 mg | ORAL_CAPSULE | Freq: Four times a day (QID) | ORAL | Status: DC

## 2013-03-18 MED ORDER — SULFAMETHOXAZOLE-TRIMETHOPRIM 800-160 MG PO TABS: 1.0000 | ORAL_TABLET | Freq: Two times a day (BID) | ORAL | Status: DC

## 2013-03-18 MED ORDER — ALBUTEROL SULFATE HFA 108 (90 BASE) MCG/ACT IN AERS: 2.0000 | INHALATION_SPRAY | RESPIRATORY_TRACT | Status: DC | PRN

## 2013-03-18 MED ORDER — ALBUTEROL SULFATE HFA 108 (90 BASE) MCG/ACT IN AERS
2.0000 | INHALATION_SPRAY | Freq: Four times a day (QID) | RESPIRATORY_TRACT | Status: DC
  Administered 2013-03-18: 2 via RESPIRATORY_TRACT
  Filled 2013-03-18: qty 6.7

## 2013-03-18 NOTE — Progress Notes (Signed)
Pt. discharged to floor,verbalized understanding of discharged instruction,medication,restriction,diet and follow up appointment.Baseline Vitals sign stable,Pt comfortable,no sign and symptom of distress. 

## 2013-03-18 NOTE — Discharge Summary (Signed)
Physician Discharge Summary  [name]:[id number] DOB: 12-18-1940 DOA: 03/14/2013  PCP: Colette Ribas, MD  Admit date: 03/14/2013 Discharge date: 03/18/2013  Time spent: 45 minutes  Recommendations for Outpatient Follow-up:   Monitor CBGs  Evaluation of microcytic anemia  Mild hyponatremia at the time of d/c  Skilled Nursing rehabilitation was recommended to Ms. Vento, but she refused.  Home Health PT, RN, social worker has been ordered.   Discharge Diagnoses:  Principal Problem:   Cellulitis Active Problems:   OBSTRUCTIVE SLEEP APNEA   Hypertension   Diabetes mellitus   Obesity, Class III, BMI 40-49.9 (morbid obesity)   Discharge Condition: stable, improved.  Diet recommendation: Heart Healthy, diabetic diet.  Filed Weights   03/14/13 1637  Weight: 151 kg (332 lb 14.3 oz)    History of present illness at the time of admission:  [name] is a 72 y.o. WF PMHx DM,HTN, diverticulitis,anemia sleep apnea (CPAP setting 12), open reduction and internal fixation of right lateral tibial plateau fracture of R leg 09/2003 performed by Dr. Eugenia Mcalpine, back surgery x2. States it three-day onset of right lower extremity swelling, increasing pain, increasing erythema. States  The pain started in her knee and radiated distally to include the right foot and ankle. She states she is unable to walk due to pain. Positive increased temperature measured at 99.7, positive chills and shakes, negative nausea vomiting. States that her diabetes is well-controlled and her last A1c was 6.1%. She takes Ultram at home for pain but no other treatments prior to arrival. She is concerned that she has a blood clot. Patient has had right lower extremity venous Doppler with the following results; Preliminary report: Right: No evidence of DVT, superficial thrombosis, or Baker's cyst. Doppler signals are pulsatile throughout the distal thigh to the ankle consistent with CHF or fluid  overload.  Hospital Course:   Right Leg Cellulitis  -Afebrile,clinically improved, Leukocytosis down trending  -Erythema in the right leg has almost resolved, pain down significantly as well  -CT Scan shows cellulitis without any abscess  -Doppler was neg for DVT  -Continue with antibiotics (currently on day 5 - has allergy to Vanco) will complete a total of 10 days of treatment.  (1st 5 days clinda, then d/c'd on bactrim DS as patient refused clinda).  HTN  -BP meds held while in the hospital. -Will resume losartan at discharge as patient's hypertension is returning with the resolution of her infection.  DM  -CBG's stable with SSI as an inpatient -Restart Metformin and Januvia at discharge -A1C-6.7   OBSTRUCTIVE SLEEP APNEA  -CPAP QHS   Obesity, Class III, BMI 40-49.9 (morbid obesity)  -counseled regarding importance of weight loss    Discharge Exam: Filed Vitals:   03/17/13 2111 03/17/13 2147 03/17/13 2148 03/18/13 0545  BP: 156/68   154/73  Pulse: 77  75 66  Temp: 98.7 F (37.1 C)   98.9 F (37.2 C)  TempSrc: Oral   Oral  Resp: 20  20 18   Height:      Weight:      SpO2: 91% 92% 92% 97%    Gen Exam: Awake and alert with clear speech.  Neck: Supple, No JVD.  Chest: B/L Clear. No accessory muscle use CVS: S1 S2 Regular, no murmurs.  Abdomen: obese, soft, BS +, non tender, non distended.  Extremities: Right Leg-erythema well within the marked area-has almost resolved. Leg is not tense and sensation is intact. Significantly less tender as well.  Neurologic:  Non Focal.  Wounds: N/A.   Discharge Instructions  Discharge Orders   Future Appointments Provider Department Dept Phone   05/13/2013 11:45 AM Atilano Ina, MD [facility], [place] [phone]   Future Orders Complete By Expires     Diet - low sodium heart healthy  As directed     Increase activity slowly  As directed         Medication List    TAKE these medications       albuterol 108 (90  BASE) MCG/ACT inhaler  Commonly known as:  PROVENTIL HFA;VENTOLIN HFA  Inhale 2 puffs into the lungs every 4 (four) hours as needed for wheezing.     aspirin 81 MG tablet  Take 162 mg by mouth daily.     CALCIUM-VITAMIN D PO  Take 1 tablet by mouth 2 (two) times daily. 600 d2     CINNAMON PLUS CHROMIUM PO  Take 2 capsules by mouth daily. 600mg      clindamycin 150 MG capsule  Commonly known as:  CLEOCIN  Take 3 capsules (450 mg total) by mouth 4 (four) times daily.     furosemide 20 MG tablet  Commonly known as:  LASIX  20 mg as needed.     GLUCOSAMINE-CHONDROITIN PLUS PO  Take 2 tablets by mouth daily. Patient takes one in am and one in pm     losartan 100 MG tablet  Commonly known as:  COZAAR  Take 100 mg by mouth every morning.     meloxicam 15 MG tablet  Commonly known as:  MOBIC  Take 7.5 mg by mouth 2 (two) times daily.     metFORMIN 500 MG tablet  Commonly known as:  GLUCOPHAGE  Take 500 mg by mouth 2 (two) times daily before lunch and supper.     montelukast 10 MG tablet  Commonly known as:  SINGULAIR  Take 10 mg by mouth daily.     sitaGLIPtin 50 MG tablet  Commonly known as:  JANUVIA  Take 25 mg by mouth every morning.     traMADol 50 MG tablet  Commonly known as:  ULTRAM  Take 50 mg by mouth at bedtime as needed.     VICKS SINEX 12 HOUR DECONGEST 0.05 % nasal spray  Generic drug:  oxymetazoline  Place 1-3 sprays into the nose 2 (two) times daily as needed for congestion.     VICKS SINEX NA  Place into the nose at bedtime.     vitamin C 500 MG tablet  Commonly known as:  ASCORBIC ACID  Take 500 mg by mouth daily.       Allergies  Allergen Reactions  . Adhesive (Tape) Hives and Rash  . Codeine Itching  . Vancomycin Rash  . Zosyn (Piperacillin Sod-Tazobactam So) Rash       Follow-up Information   Follow up with Colette Ribas, MD. Schedule an appointment as soon as possible for a visit in 1 week.   Contact information:   [address] [place] [address] [phone]        The results of significant diagnostics from this hospitalization (including imaging, microbiology, ancillary and laboratory) are listed below for reference.    Significant Diagnostic Studies: Dg Chest 2 View  03/14/2013   *RADIOLOGY REPORT*  Clinical Data: Hypoxia, lower extremity edema and shortness of breath.  CHEST - 2 VIEW  Comparison: 11/12/2011  Findings: Lung volumes are low bilaterally with bibasilar atelectasis present.  There is no evidence of pulmonary edema, focal consolidation or pleural fluid.  Heart size is within normal limits.  The aorta is mildly ectatic.  The bony thorax is unremarkable.  IMPRESSION: Low lung volumes with bibasilar atelectasis.   Original Report Authenticated By: Irish Lack, M.D.   Ct Tibia Fibula Right W Contrast  03/15/2013   *RADIOLOGY REPORT*  Clinical Data: Right lower extremity pain and swelling.  CT OF THE RIGHT TIBIA AND FIBULA WITH CONTRAST  Contrast: 80mL OMNIPAQUE IOHEXOL 300 MG/ML  SOLN  Comparison: None.  Findings: There is circumferential subcutaneous soft tissue edema consistent with cellulitis.  There are no abscesses.  There is no evidence of fasciitis.   The muscles and bones of the right lower leg demonstrate no acute abnormalities.  Post-traumatic arthritic changes of the right knee with a small right knee effusion and slight arthritic changes of the dome of the talus are noted.  The vessels in the right lower leg appear normal.  The hardware in the proximal tibia and demonstrates no evidence of loosening.  IMPRESSION:  1.  Circumferential subcutaneous edema in the right lower leg without evidence of abscess or deep space infection. The finding is consistent with cellulitis. 2.  No acute abnormality of the bones or muscles.   Original Report Authenticated By: Francene Boyers, M.D.   Dg Chest Port 1 View  03/16/2013   *RADIOLOGY REPORT*  Clinical Data: Short of  breath.  PORTABLE CHEST - 1 VIEW  Comparison: 03/14/2013.  Findings: Cardiomegaly and pulmonary vascular congestion.  Lung volumes appears similar to the prior exam.  No airspace disease. No pleural effusion.  IMPRESSION: Cardiomegaly and pulmonary vascular congestion.   Original Report Authenticated By: Andreas Newport, M.D.    Microbiology: Recent Results (from the past 240 hour(s))  MRSA PCR SCREENING     Status: None   Collection Time    03/14/13  5:11 PM      Result Value Range Status   MRSA by PCR NEGATIVE  NEGATIVE Final   Comment:            The GeneXpert MRSA Assay (FDA     approved for NASAL specimens     only), is one component of a     comprehensive MRSA colonization     surveillance program. It is not     intended to diagnose MRSA     infection nor to guide or     monitor treatment for     MRSA infections.     Labs: Basic Metabolic Panel:  Recent Labs Lab 03/14/13 1206 03/14/13 1933 03/15/13 0445 03/16/13 0530  NA 134*  --  132* 131*  K 3.8  --  3.8 4.1  CL 97  --  97 96  CO2 26  --  27 25  GLUCOSE 173*  --  172* 213*  BUN 17  --  21 19  CREATININE 0.92 0.92 0.92 0.88  CALCIUM 9.2  --  8.2* 8.0*   Liver Function Tests:  Recent Labs Lab 03/15/13 0445  AST 14  ALT 16  ALKPHOS 118*  BILITOT 0.6  PROT 6.5  ALBUMIN 2.8*   CBC:  Recent Labs Lab 03/14/13 1206 03/14/13 1933 03/15/13 0445 03/16/13 0530 03/18/13 0516  WBC 17.3* 19.9* 18.5* 17.7* 15.5*  NEUTROABS 14.5*  --  13.3*  --   --   HGB 11.5* 10.4* 10.0* 10.3* 10.2*  HCT 37.2 34.5* 33.2* 34.6* 33.7*  MCV 73.5* 74.2* 75.1* 75.1* 73.7*  PLT 271  265 269 299 306   CBG:  Recent Labs Lab 03/17/13 0723 03/17/13 1220 03/17/13 1731 03/17/13 2118 03/18/13 0749  GLUCAP 152* 175* 144* 169* 147*    Signed:  Conley Canal  [facility] 03/18/2013, 12:52 PM

## 2013-03-18 NOTE — Progress Notes (Signed)
   CARE MANAGEMENT NOTE 03/18/2013  Patient:  [name], [name]   Account Number:  [id number]  Date Initiated:  03/16/2013  Documentation initiated by:  [facility]  Subjective/Objective Assessment:   cellulitis     Action/Plan:   lives at home with husband   Anticipated DC Date:  03/18/2013   Anticipated DC Plan:  HOME W HOME HEALTH SERVICES      DC Planning Services  CM consult      [facility] Choice  HOME HEALTH   Choice offered to / List presented to:  C-1 Patient        HH arranged  HH-2 PT  HH-1 RN  HH-6 SOCIAL WORKER      HH agency  [facility]   Status of service:  Completed, signed off Medicare Important Message given?   (If response is "NO", the following Medicare IM given date fields will be blank) Date Medicare IM given:   Date Additional Medicare IM given:    Discharge Disposition:  HOME W HOME HEALTH SERVICES  Per UR Regulation:    If discussed at Long Length of Stay Meetings, dates discussed:    Comments:  03/18/2013 1400 Referral to [facility] for [facility]. Appt arranged with Dr. Lamar Blinks office. Isidoro Donning RN CCM Case Mgmt phone [phone]  03/17/2013 1500 NCM spoke to pt and states she does want IP rehab but not SNF. States she had aide through her LTC policy. She had aide that comes from 8-4 pm/7 days a week. States she had [facility] in the past. Will request [facility] RN/PT at dc for follow up at home if not appropriate for CIR. Isidoro Donning RN CCM Case Mgmt phone [phone]

## 2013-03-18 NOTE — Discharge Summary (Signed)
Addendum  Patient seen and examined, chart and data base reviewed.  I agree with the above assessment and plan.  For full details please see Mrs. Algis Downs PA note.   Clint Lipps, MD [facility] Pager: [phone] 03/18/2013, 6:51 PM

## 2013-03-19 IMAGING — CT CT ABD-PELV W/ CM
2 of 5 series · 13 of 32 positions shown, 18 images · IV contrast (READICAT & 100ml omni 300)
Comparison: 10/27/2011.

CLINICAL DATA: Colostomy reversal 06/26/2012.  Wound drainage.

CT ABDOMEN AND PELVIS WITH CONTRAST
TECHNIQUE: Multidetector CT imaging of the abdomen and pelvis was
performed following the standard protocol during bolus
administration of intravenous contrast.
Contrast: 100mL OMNIPAQUE IOHEXOL 300 MG/ML  SOLN

[Series 2: routine abdomen · axial · 0.98mm/px · z∈[-419,-119]mm · 5 of 91 slices shown, 10 images]
[im 16/91  soft-tissue]
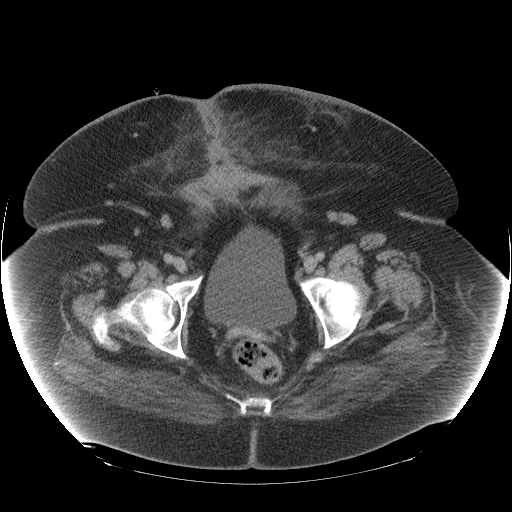
[im 16/91  bone]
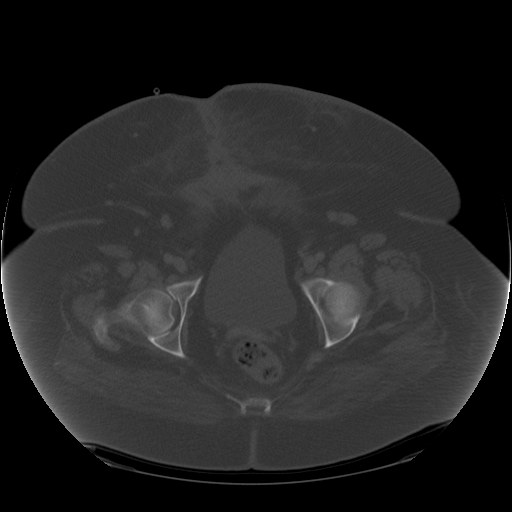
[im 31/91  soft-tissue]
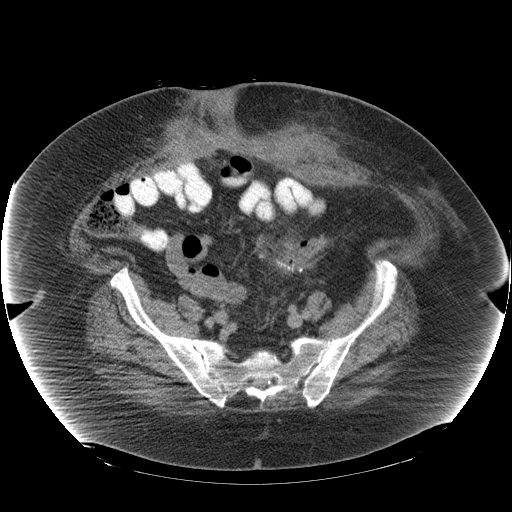
[im 31/91  lung]
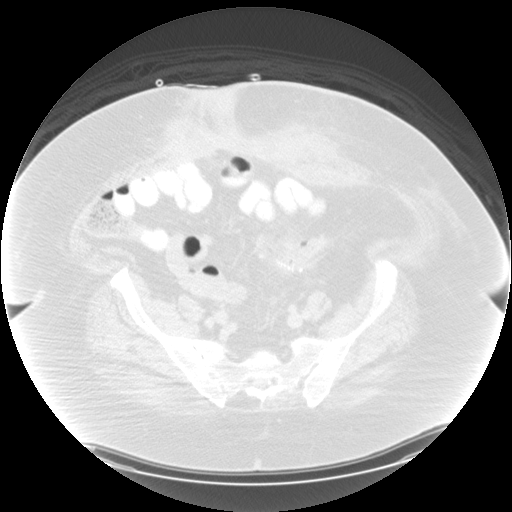
[im 46/91  soft-tissue]
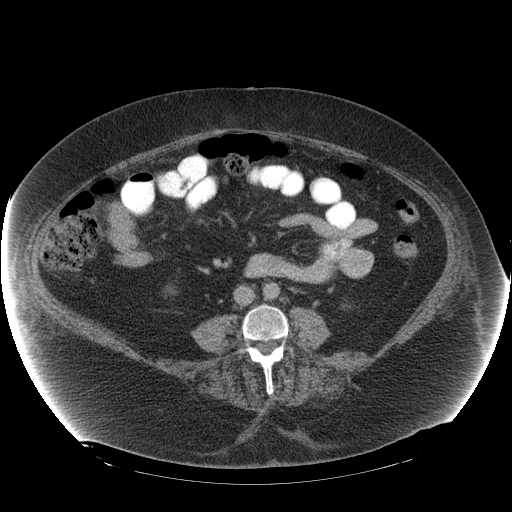
[im 46/91  lung]
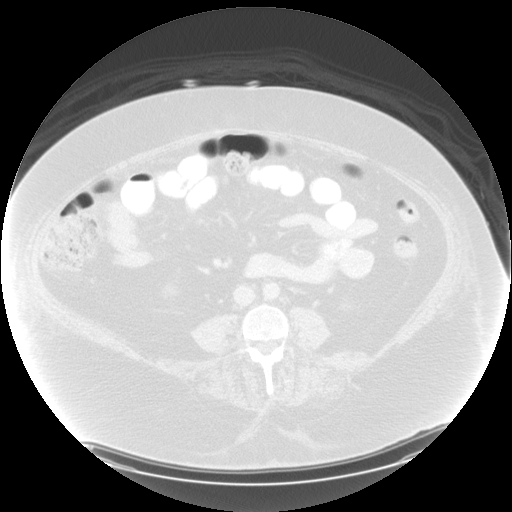
[im 61/91  soft-tissue]
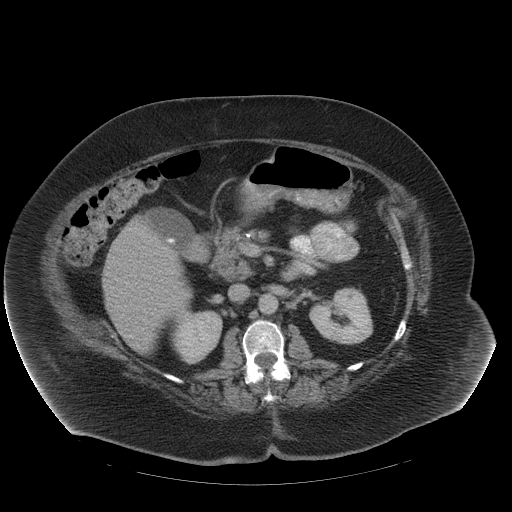
[im 61/91  lung]
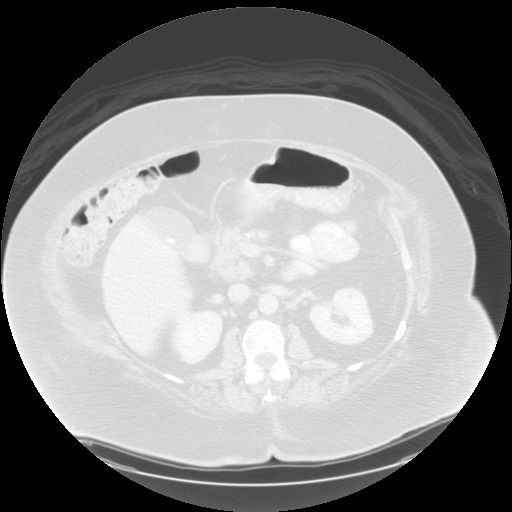
[im 76/91  soft-tissue]
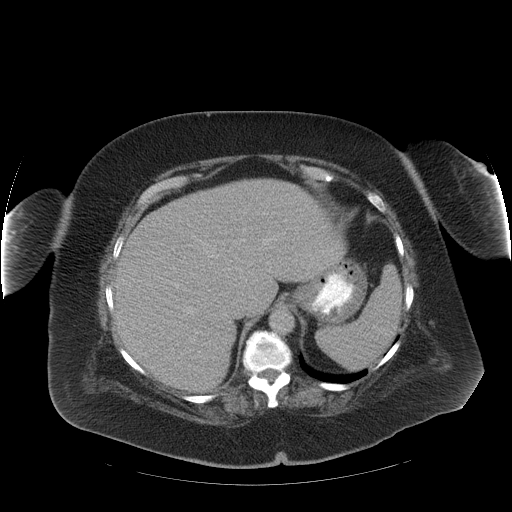
[im 76/91  lung]
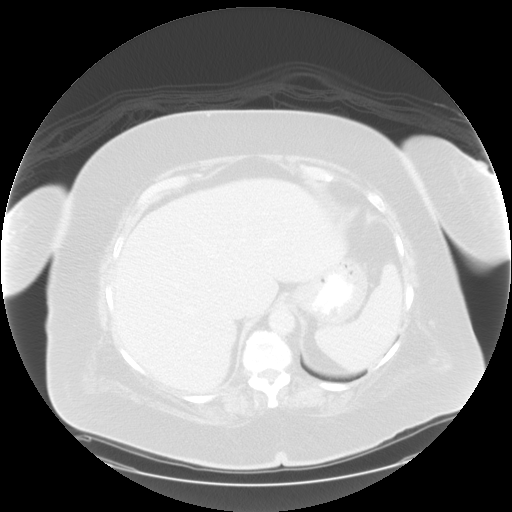

[Series 103: sagittals · sagittal · 0.98mm/px · 8 of 139 slices shown]
[im 14/139  soft-tissue]
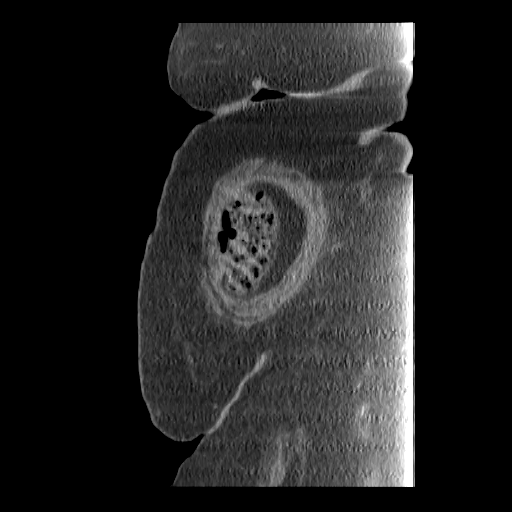
[im 28/139  soft-tissue]
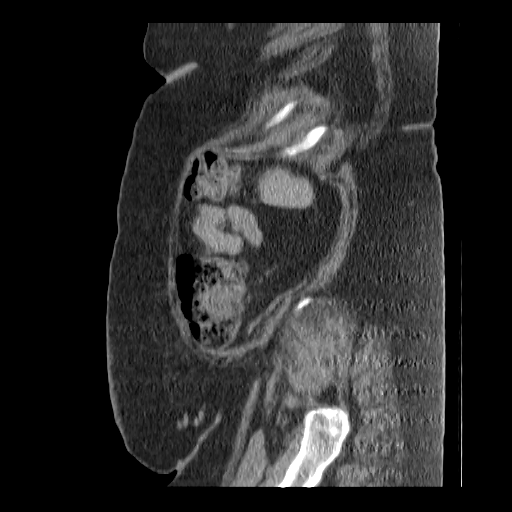
[im 42/139  soft-tissue]
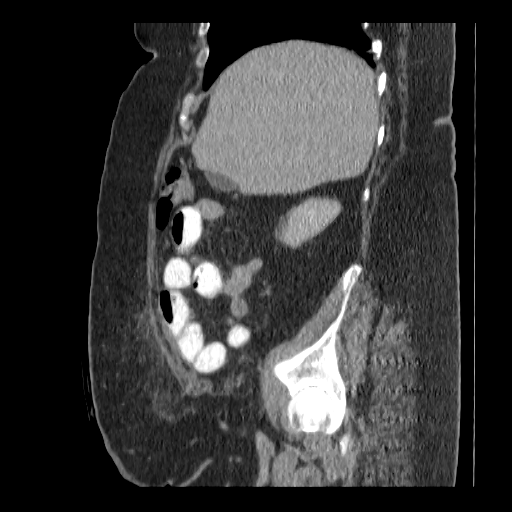
[im 56/139  soft-tissue]
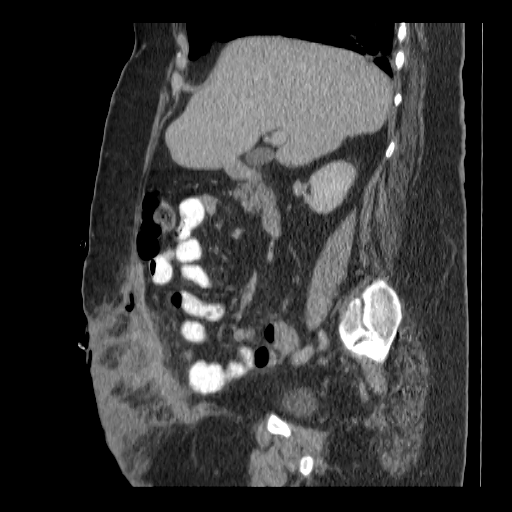
[im 83/139  soft-tissue]
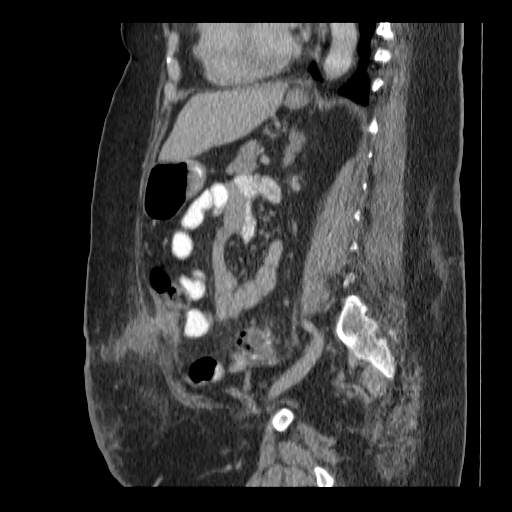
[im 97/139  soft-tissue]
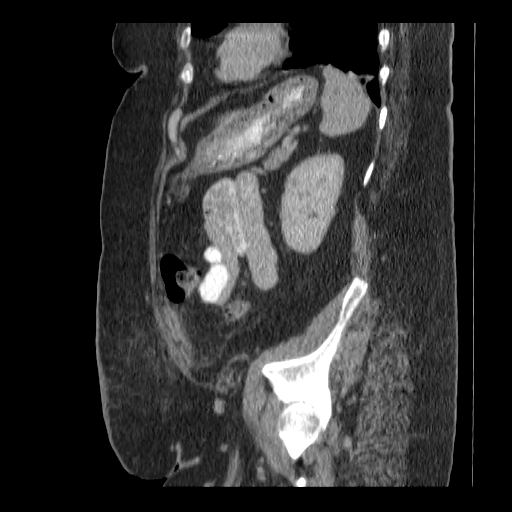
[im 111/139  soft-tissue]
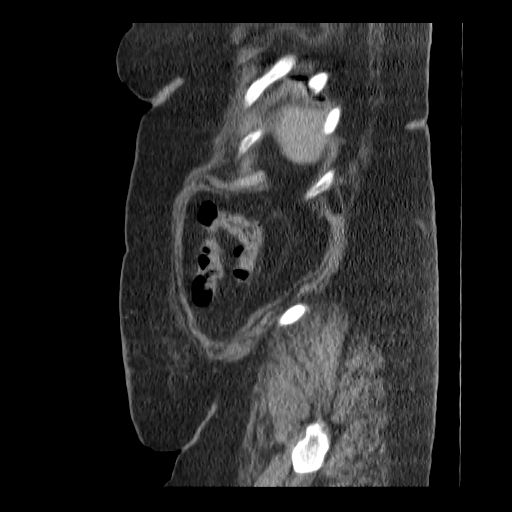
[im 125/139  soft-tissue]
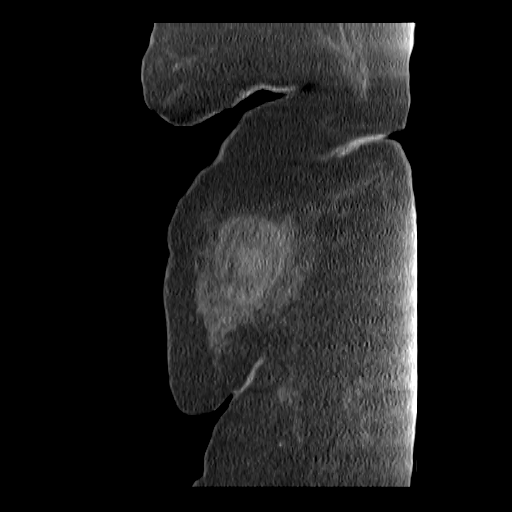

[13 of 32 positions shown; findings below may reference images not displayed]

FINDINGS: Lung bases show volume loss in the lower lobes.  Heart is
at the upper limits of normal in size.  No pericardial or pleural
effusion.

Liver is unremarkable.  A stone is seen in the gallbladder.  A
calcification is also seen in the pancreatic head, in the region of
the ampulla.  No associated biliary duct dilatation.  Right adrenal
gland is unremarkable.  A 1.2 x 1.8 cm nodule in the left adrenal
gland is stable in size. On the prior noncontrast study, the lesion
measured 0 HU.  Right kidney is unremarkable.  Sub centimeter low
attenuation lesion in the left kidney is too small to characterize.
Spleen is unremarkable.

Scattered small calcifications are seen in the pancreas.  No ductal
dilatation.  Stomach and small bowel are unremarkable.
Postoperative changes are seen in the sigmoid colon.  Colon is
otherwise unremarkable.

Edema, stranding and locules of air are seen in the ventral
abdominal wall, at the site of postoperative changes.  No
extraluminal contrast is seen in association.  Question small
organized fluid collection deep to the lower rectus abdominus
musculature (image 73), measuring 3.3 cm.  Associated locules of
air are seen anteriorly.  Again, no associated oral contrast.  No
pathologically enlarged lymph nodes.  No free fluid.  No worrisome
lytic or sclerotic lesions.
IMPRESSION: 1.  Postoperative changes involving the sigmoid colon and ventral
abdominal wall.  Associated stranding and air with possible small
organized fluid collection (abscess) deep to the inferior rectus
abdominus musculature.  If this represents an abscess, there
appears to be communication between it and the superficial ventral
abdominal wall.
2.  Cholelithiasis.
3.  Left adrenal adenoma.
4.  Chronic calcific pancreatitis.

## 2013-05-13 ENCOUNTER — Encounter (INDEPENDENT_AMBULATORY_CARE_PROVIDER_SITE_OTHER): Payer: Self-pay | Admitting: General Surgery

## 2013-05-13 ENCOUNTER — Ambulatory Visit (INDEPENDENT_AMBULATORY_CARE_PROVIDER_SITE_OTHER): Payer: Medicare Other | Admitting: General Surgery

## 2013-05-13 VITALS — BP 164/76 | HR 76 | Temp 97.4°F | Resp 16 | Ht 65.0 in | Wt 326.4 lb

## 2013-05-13 DIAGNOSIS — K432 Incisional hernia without obstruction or gangrene: Secondary | ICD-10-CM | POA: Insufficient documentation

## 2013-05-13 NOTE — Patient Instructions (Signed)
GETTING TO GOOD BOWEL HEALTH. Irregular bowel habits such as constipation and diarrhea can lead to many problems over time.  Having one soft bowel movement a day is the most important way to prevent further problems.  The anorectal canal is designed to handle stretching and feces to safely manage our ability to get rid of solid waste (feces, poop, stool) out of our body.  BUT, hard constipated stools can act like ripping concrete bricks and diarrhea can be a burning fire to this very sensitive area of our body, causing inflamed hemorrhoids, anal fissures, increasing risk is perirectal abscesses, abdominal pain/bloating, an making irritable bowel worse.     The goal: ONE SOFT BOWEL MOVEMENT A DAY!  To have soft, regular bowel movements:    Drink at least 8 tall glasses of water a day.     Take plenty of fiber.  Fiber is the undigested part of plant food that passes into the colon, acting s "natures broom" to encourage bowel motility and movement.  Fiber can absorb and hold large amounts of water. This results in a larger, bulkier stool, which is soft and easier to pass. Work gradually over several weeks up to 6 servings a day of fiber (25g a day even more if needed) in the form of: o Vegetables -- Root (potatoes, carrots, turnips), leafy green (lettuce, salad greens, celery, spinach), or cooked high residue (cabbage, broccoli, etc) o Fruit -- Fresh (unpeeled skin & pulp), Dried (prunes, apricots, cherries, etc ),  or stewed ( applesauce)  o Whole grain breads, pasta, etc (whole wheat)  o Bran cereals    Bulking Agents -- This type of water-retaining fiber generally is easily obtained each day by one of the following:  o Psyllium bran -- The psyllium plant is remarkable because its ground seeds can retain so much water. This product is available as Metamucil, Konsyl, Effersyllium, Per Diem Fiber, or the less expensive generic preparation in drug and health food stores. Although labeled a laxative, it really  is not a laxative.  o Methylcellulose -- This is another fiber derived from wood which also retains water. It is available as Citrucel. o Polyethylene Glycol - and "artificial" fiber commonly called Miralax or Glycolax.  It is helpful for people with gassy or bloated feelings with regular fiber o Flax Seed - a less gassy fiber than psyllium   No reading or other relaxing activity while on the toilet. If bowel movements take longer than 5 minutes, you are too constipated   AVOID CONSTIPATION.  High fiber and water intake usually takes care of this.  Sometimes a laxative is needed to stimulate more frequent bowel movements, but    Laxatives are not a good long-term solution as it can wear the colon out. o Osmotics (Milk of Magnesia, Fleets phosphosoda, Magnesium citrate, MiraLax, GoLytely) are safer than  o Stimulants (Senokot, Castor Oil, Dulcolax, Ex Lax)    o Do not take laxatives for more than 7days in a row.    IF SEVERELY CONSTIPATED, try a Bowel Retraining Program: o Do not use laxatives.  o Eat a diet high in roughage, such as bran cereals and leafy vegetables.  o Drink six (6) ounces of prune or apricot juice each morning.  o Eat two (2) large servings of stewed fruit each day.  o Take one (1) heaping tablespoon of a psyllium-based bulking agent twice a day. Use sugar-free sweetener when possible to avoid excessive calories.  o Eat a normal breakfast.  o   Set aside 15 minutes after breakfast to sit on the toilet, but do not strain to have a bowel movement.  o If you do not have a bowel movement by the third day, use an enema and repeat the above steps.   Ventral Hernia A ventral hernia (also called an incisional hernia) is a hernia that occurs at the site of a previous surgical cut (incision) in the abdomen. The abdominal wall spans from your lower chest down to your pelvis. If the abdominal wall is weakened from a surgical incision, a hernia can occur. A hernia is a bulge of bowel or  muscle tissue pushing out on the weakened part of the abdominal wall. Ventral hernias can get bigger from straining or lifting. Obese and older people are at higher risk for a ventral hernia. People who develop infections after surgery or require repeat incisions at the same site on the abdomen are also at increased risk. CAUSES  A ventral hernia occurs because of weakness in the abdominal wall at an incision site.  SYMPTOMS  Common symptoms include:  A visible bulge or lump on the abdominal wall.  Pain or tenderness around the lump.  Increased discomfort if you cough or make a sudden movement. If the hernia has blocked part of the intestine, a serious complication can occur (incarcerated or strangulated hernia). This can become a problem that requires emergency surgery because the blood flow to the blocked intestine may be cut off. Symptoms may include:  Feeling sick to your stomach (nauseous).  Throwing up (vomiting).  Stomach swelling (distention) or bloating.  Fever.  Rapid heartbeat. DIAGNOSIS  Your caregiver will take a medical history and perform a physical exam. Various tests may be ordered, such as:  Blood tests.  Urine tests.  Ultrasonography.  X-rays.  Computed tomography (CT). TREATMENT  Watchful waiting may be all that is needed for a smaller hernia that does not cause symptoms. Your caregiver may recommend the use of a supportive belt (truss) that helps to keep the abdominal wall intact. For larger hernias or those that cause pain, surgery to repair the hernia is usually recommended. If a hernia becomes strangulated, emergency surgery needs to be done right away. HOME CARE INSTRUCTIONS  Avoid putting pressure or strain on the abdominal area.  Avoid heavy lifting.  Use good body positioning for physical tasks. Ask your caregiver about proper body positioning.  Use a supportive belt as directed by your caregiver.  Maintain a healthy weight.  Eat foods that  are high in fiber, such as whole grains, fruits, and vegetables. Fiber helps prevent difficult bowel movements (constipation).  Drink enough fluids to keep your urine clear or pale yellow.  Follow up with your caregiver as directed. SEEK MEDICAL CARE IF:   Your hernia seems to be getting larger or more painful. SEEK IMMEDIATE MEDICAL CARE IF:   You have abdominal pain that is sudden and sharp.  Your pain becomes severe.  You have repeated vomiting.  You are sweating a lot.  You notice a rapid heartbeat.  You develop a fever. MAKE SURE YOU:   Understand these instructions.  Will watch your condition.  Will get help right away if you are not doing well or get worse. Document Released: 08/27/2012 Document Reviewed: 08/15/2012 [facility] Patient Information 2014 [place], [place].

## 2013-05-13 NOTE — Progress Notes (Signed)
Subjective:     Patient ID: [name], female   DOB: 1941-07-19, 72 y.o.   MRN: [id number]  HPI 72 year old morbidly obese Caucasian female comes in for long-term followup after ongoing colostomy reversal. I last saw her a few months ago. She had developed a surgical site wound infection. Since she was last seen she states that the wound has completely closed. There has been no new sinuses that have opened up. She denies any fevers or chills. She denies any abdominal pain. She states that she's still taking MiraLAX. She states that she's tried to stop taking the MiraLAX she became constipated. She did have a recent hospitalization in June for lower extremity cellulitis.  Review of Systems     Objective:   Physical Exam BP 164/76  Pulse 76  Temp(Src) 97.4 F (36.3 C) (Temporal)  Resp 16  Ht 5\' 5"  (1.651 m)  Wt 326 lb 6.4 oz (148.054 kg)  BMI 54.32 kg/m2 Alert, nad Obese, soft, nt, nd. Well healed midline incision by secondary intention. About a 2.5-3cm incisional hernia to left of midline in upper abdomen. No cellulitis No LE edema    Assessment:     Status post colostomy reversal Incisional hernia Morbid obesity     Plan:     It appears that her midline incision has completely healed. As noticed at her last appointment she has developed an incisional hernia. She has not had any issues with incarceration or strangulation. We rediscussed the symptoms of incarceration and strangulation. Currently I advised against hernia repair. The patient is not interested in hernia repair either. I explained to the patient that at her current weight that she would be at significant risk for recurrence. We discussed the importance of ongoing exercise and caloric restriction. [facility] Doing [facility] aerobics. We also discussed drinking plenty of water as well as eating a high fiber diet. We discussed fiber supplementation with Benefiber Metamucil. She was given fiber  Agricultural engineer. Followup as needed  Mary Sella. Andrey Campanile, MD, FACS General, Bariatric, & Minimally Invasive Surgery [facility], [place]

## 2013-07-16 ENCOUNTER — Encounter (INDEPENDENT_AMBULATORY_CARE_PROVIDER_SITE_OTHER): Payer: Self-pay | Admitting: General Surgery

## 2013-07-16 ENCOUNTER — Encounter (INDEPENDENT_AMBULATORY_CARE_PROVIDER_SITE_OTHER): Payer: Self-pay

## 2013-07-16 ENCOUNTER — Ambulatory Visit (INDEPENDENT_AMBULATORY_CARE_PROVIDER_SITE_OTHER): Payer: Medicare Other | Admitting: General Surgery

## 2013-07-16 VITALS — BP 128/68 | HR 80 | Resp 20 | Ht 65.0 in | Wt 336.0 lb

## 2013-07-16 DIAGNOSIS — T8189XD Other complications of procedures, not elsewhere classified, subsequent encounter: Secondary | ICD-10-CM

## 2013-07-16 DIAGNOSIS — T8131XA Disruption of external operation (surgical) wound, not elsewhere classified, initial encounter: Secondary | ICD-10-CM

## 2013-07-16 MED ORDER — DOXYCYCLINE HYCLATE 50 MG PO CAPS: 50.0000 mg | ORAL_CAPSULE | Freq: Two times a day (BID) | ORAL | Status: DC

## 2013-07-16 NOTE — Progress Notes (Signed)
Subjective:     Patient ID: [name], female   DOB: Oct 19, 1940, 72 y.o.   MRN: [id number]  HPI 72 year old morbidly obese Caucasian female comes in because of new spontaneous drainage from her midline incision from her colostomy takedown for about a year ago. I last saw her in the office on August 20. At that time her incision has completely closed and she had no longer had any additional areas of spontaneous skin separation. She states that she awoke this morning with a large amount of blood-tinged drainage on her nightgown. One aspect of her incision had split open. She denies any fevers. She reports some subjective chills. She denies any nausea or vomiting. She denies any abdominal pain. She denies any diarrhea or constipation. He states that she's has been trying to exercise although unsuccessfully. She has gained 10 pounds since I last saw her in August  PMHx, PSHx, SOCHx, FAMHx, ALL reviewed and unchanged  Review of Systems 8 point review of systems is negative except for what is mentioned in the history of present illness    Objective:   Physical Exam  Abdominal:    Has 1cm x 1cm opening. Mild blanching erythema for about 1.5 in to l/r of opening. +incisional hernia   BP 128/68  Pulse 80  Resp 20  Ht 5\' 5"  (1.651 m)  Wt 336 lb (152.409 kg)  BMI 55.91 kg/m2 Morbidly obese No apparent distress No edema, jaundice, rash Obese abdomen, nontender, nondistended.  Alert, appropriate    Assessment:     S/p colostomy takedown Wound dehiscence Morbid obesity BMI 55.9     Plan:     Unfortunately it appears that she is a new area skin opening. There is some mild cellulitis. Therefore I recommend placing her on an antibiotic. She was given doxycycline for 7 days. I encouraged her husband to continue daily dressing changes like he started doing this morning. He is to wait the wound with iodoform gauze and covered with a bandadge. I know this is frustrating to the patient have ongoing  intermittent episodes of this where the skin will spontaneously open and drain. I really don't think going back to the operating room and reopening her midline is an option since we would have to repair the ventral incisional hernia at the same time. With her current body habitus she would be guaranteed a hernia recurrence. Right now I recommended ongoing symptomatic local control. We discussed her ongoing weight gain. We discussed the importance of weight loss. I offered to refer her back to the nutritionist but she declined. We discussed medication regimens for weight loss as well as surgical intervention. She states that she is interested in medication intervention; however, I informed her I would have to do some research given her comorbidities before prescribing. Followup 4-6 weeks  Mary Sella. Andrey Campanile, MD, FACS General, Bariatric, & Minimally Invasive Surgery [facility], [place]

## 2013-07-16 NOTE — Patient Instructions (Signed)
Pick up prescription at pharmacy Continue current wound care

## 2013-08-27 ENCOUNTER — Ambulatory Visit (INDEPENDENT_AMBULATORY_CARE_PROVIDER_SITE_OTHER): Payer: Medicare Other | Admitting: General Surgery

## 2013-08-27 ENCOUNTER — Encounter (INDEPENDENT_AMBULATORY_CARE_PROVIDER_SITE_OTHER): Payer: Self-pay | Admitting: General Surgery

## 2013-08-27 VITALS — BP 152/82 | HR 75

## 2013-08-27 DIAGNOSIS — Z5189 Encounter for other specified aftercare: Secondary | ICD-10-CM

## 2013-08-27 DIAGNOSIS — T8189XD Other complications of procedures, not elsewhere classified, subsequent encounter: Secondary | ICD-10-CM

## 2013-08-27 NOTE — Patient Instructions (Signed)
Call with questions Work on food choices

## 2013-08-27 NOTE — Progress Notes (Signed)
Subjective:     Patient ID: [name], female   DOB: [birth year], 72 y.o.   MRN: [id number]  HPI 72 year old morbidly obese Caucasian female comes in for long-term followup regarding an intermittently draining midline incision from her colostomy takedown from September 2013. The midline incision will spontaneously open and one or 2 places and drain fluid. I last saw her in the office in October 23 when the wound reopened in one place. She states that she is doing well. She states that it closed back up. She denies any fever or chills. She denies any abdominal pain. She reports daily bowel movements. She is accompanied by her husband  PMHx, PSHx, SOCHx, FAMHx, ALL reviewed and unchanged  Review of Systems 6 point review of systems was performed and all are negative except for what is mentioned in the history of present illness    Objective:   Physical Exam BP 152/82  Pulse 75 Alert, no apparent distress, morbidly obese Abdomen-soft, nontender, morbidly obese, large pannus. Midline incision is completely healed with a reducible nontender upper midline incisional hernia. No cellulitis, fluctuance or induration    Assessment:     Status post colostomy reversal with intermittently draining postoperative incision Ventral incisional hernia Morbid obesity     Plan:     There is no sign of current infection. I explained to the patient and her husband that it is possible for the incision to open back up again at some point in the future. In order to prevent this from reoccurring we would have to open up the midline incision, wash it out, repair her ventral hernia, and let it heal by secondary intention. However with her current weight her chance of recurrent hernia formation is extremely high; therefore, I recommended no surgery at this point. I recognize and acknowledged with the patient while this is a nuisance and inconvenience it is no life threatening. We Discussed the signs and symptoms of  incarceration and strangulation. I believe it is low likelihood since the defect is of generous size. Followup as needed. We discussed the importance of activity as well as proper food choices and weight loss.  Mary Sella. Andrey Campanile, MD, FACS General, Bariatric, & Minimally Invasive Surgery [facility], [place]

## 2014-03-01 ENCOUNTER — Ambulatory Visit (INDEPENDENT_AMBULATORY_CARE_PROVIDER_SITE_OTHER): Payer: Medicare Other | Admitting: General Surgery

## 2014-03-01 ENCOUNTER — Encounter (INDEPENDENT_AMBULATORY_CARE_PROVIDER_SITE_OTHER): Payer: Self-pay | Admitting: General Surgery

## 2014-03-01 VITALS — BP 132/78 | HR 84 | Temp 97.1°F | Ht 65.0 in | Wt 325.0 lb

## 2014-03-01 DIAGNOSIS — IMO0002 Reserved for concepts with insufficient information to code with codable children: Secondary | ICD-10-CM

## 2014-03-01 DIAGNOSIS — T8189XA Other complications of procedures, not elsewhere classified, initial encounter: Secondary | ICD-10-CM

## 2014-03-01 DIAGNOSIS — S31109A Unspecified open wound of abdominal wall, unspecified quadrant without penetration into peritoneal cavity, initial encounter: Secondary | ICD-10-CM

## 2014-03-01 MED ORDER — DOXYCYCLINE HYCLATE 50 MG PO CAPS: 100.0000 mg | ORAL_CAPSULE | Freq: Two times a day (BID) | ORAL | Status: DC

## 2014-03-01 NOTE — Progress Notes (Addendum)
Subjective:     Patient ID: [name], female   DOB: 03/13/1941, 73 y.o.   MRN: [id number]  HPI The patient comes in with a draining midline wound in the lower portion of her abdomen. She is morbidly obese. She does not have a fever and although she's had some chills she has not demonstrated a fever.  This is been going on for the past 24-48 hours. She was told by Dr. [name] that this was a possibility in the future.  Review of Systems Chills but no fevers. Large amount of purulent drainage from her lower midline wound.    Objective:   Physical Exam The patient has about a 3-4 midline central necrotic area through which a Q-tip can be pulled for approximately 6 inches. Coming out of the was a dark brown slightly foul-smelling fluid which appeared to be old blood possibly infected. This area was prepped with Betadine prior to being probed. A culture was sent.    Assessment:     Chronically infected and draining midline wound.         Plan:     Start the patient on doxycycline to be taken twice a day. Have her followup with Dr. Greer Pickerel for continued wound management. Currently she is just to keep it covered with sterile dressing whenever he gets saturated and redress as needed. This can be with 4 x 4's for an ABD pad.

## 2014-03-02 ENCOUNTER — Ambulatory Visit (INDEPENDENT_AMBULATORY_CARE_PROVIDER_SITE_OTHER): Payer: Medicare Other

## 2014-03-02 VITALS — BP 151/79 | HR 67 | Resp 19 | Ht 65.0 in | Wt 325.0 lb

## 2014-03-02 DIAGNOSIS — E1142 Type 2 diabetes mellitus with diabetic polyneuropathy: Secondary | ICD-10-CM

## 2014-03-02 DIAGNOSIS — L608 Other nail disorders: Secondary | ICD-10-CM

## 2014-03-02 DIAGNOSIS — E1140 Type 2 diabetes mellitus with diabetic neuropathy, unspecified: Secondary | ICD-10-CM

## 2014-03-02 DIAGNOSIS — E1149 Type 2 diabetes mellitus with other diabetic neurological complication: Secondary | ICD-10-CM

## 2014-03-02 NOTE — Progress Notes (Signed)
   Subjective:    Patient ID: [name], female    DOB: Jul 30, 1941, 73 y.o.   MRN: [id number]  HPI Comments: N debridement L 1 - 10 toenails D and O long-term C elongated, thick toenails A diabetes, difficult to cut T home pedicure  Diabetes      Review of Systems  Gastrointestinal:       Incision infection from reversal of the colostomy.  All other systems reviewed and are negative.      Objective:   Physical Exam Lower extremity objective findings as follows vascular status is intact with pedal pulses palpable DP +2/4 bilateral PT plus one over 4 bilateral there is minimal edema noted no rubor pallor or varicosities noted. Neurologically epicritic and proprioceptive sensations intact bilateral there is some decreased sensation plantar arch heel and midfoot on the right foot more so than left left foot has intact sensation in all areas dorsal foot and in the digits also mid arch a decreased sensation is noted. No history of open wounds ulcerations or difficulties there is an area of abrasion or irritation of the first metatarsal base dorsal medial on the right foot secondary certain shoewear patient wearing nylons with a pair slip on type shoes recommendations this time for lace up or Oxford type shoes with a Velcro releases and cotton or Kerlix socks at all times avoid going barefoot or flimsy shoes or flip-flops her shoes without appropriate socks liner.       Assessment & Plan:  Assessment this time diabetes with neuropathy this and gait abnormalities patient walks with the assistance of a walker since surgery several years ago for diverticulitis. Patient continues to have open wounds about wounds for previous surgeries and complications however is for his feet go basket status is intact minimal decreased epicritic sensation noted nails thickened yellow brittle criptotic incurvated 1 through 5 bilateral are debrided at this time and the presence of diabetes and complications  return to 3 months for continued palliative care in the future recommended repeat shoes and socks at all times  Harriet Masson DPM

## 2014-03-02 NOTE — Patient Instructions (Signed)
Diabetes and Foot Care Diabetes may cause you to have problems because of poor blood supply (circulation) to your feet and legs. This may cause the skin on your feet to become thinner, break easier, and heal more slowly. Your skin may become dry, and the skin may peel and crack. You may also have nerve damage in your legs and feet causing decreased feeling in them. You may not notice minor injuries to your feet that could lead to infections or more serious problems. Taking care of your feet is one of the most important things you can do for yourself.  HOME CARE INSTRUCTIONS  Wear shoes at all times, even in the house. Do not go barefoot. Bare feet are easily injured.  Check your feet daily for blisters, cuts, and redness. If you cannot see the bottom of your feet, use a mirror or ask someone for help.  Wash your feet with warm water (do not use hot water) and mild soap. Then pat your feet and the areas between your toes until they are completely dry. Do not soak your feet as this can dry your skin.  Apply a moisturizing lotion or petroleum jelly (that does not contain alcohol and is unscented) to the skin on your feet and to dry, brittle toenails. Do not apply lotion between your toes.  Trim your toenails straight across. Do not dig under them or around the cuticle. File the edges of your nails with an emery board or nail file.  Do not cut corns or calluses or try to remove them with medicine.  Wear clean socks or stockings every day. Make sure they are not too tight. Do not wear knee-high stockings since they may decrease blood flow to your legs.  Wear shoes that fit properly and have enough cushioning. To break in new shoes, wear them for just a few hours a day. This prevents you from injuring your feet. Always look in your shoes before you put them on to be sure there are no objects inside.  Do not cross your legs. This may decrease the blood flow to your feet.  If you find a minor scrape,  cut, or break in the skin on your feet, keep it and the skin around it clean and dry. These areas may be cleansed with mild soap and water. Do not cleanse the area with peroxide, alcohol, or iodine.  When you remove an adhesive bandage, be sure not to damage the skin around it.  If you have a wound, look at it several times a day to make sure it is healing.  Do not use heating pads or hot water bottles. They may burn your skin. If you have lost feeling in your feet or legs, you may not know it is happening until it is too late.  Make sure your health care provider performs a complete foot exam at least annually or more often if you have foot problems. Report any cuts, sores, or bruises to your health care provider immediately. SEEK MEDICAL CARE IF:   You have an injury that is not healing.  You have cuts or breaks in the skin.  You have an ingrown nail.  You notice redness on your legs or feet.  You feel burning or tingling in your legs or feet.  You have pain or cramps in your legs and feet.  Your legs or feet are numb.  Your feet always feel cold. SEEK IMMEDIATE MEDICAL CARE IF:   There is increasing redness,   swelling, or pain in or around a wound.  There is a red line that goes up your leg.  Pus is coming from a wound.  You develop a fever or as directed by your health care provider.  You notice a bad smell coming from an ulcer or wound. Document Released: 09/07/2000 Document Revised: 05/13/2013 Document Reviewed: 02/17/2013 [employer] Patient Information 2014 [employer].  

## 2014-03-03 ENCOUNTER — Telehealth (INDEPENDENT_AMBULATORY_CARE_PROVIDER_SITE_OTHER): Payer: Self-pay

## 2014-03-03 NOTE — Telephone Encounter (Signed)
Script for surgical supplies faxed to [facility].

## 2014-03-06 LAB — WOUND CULTURE: Gram Stain: NONE SEEN

## 2014-03-12 ENCOUNTER — Telehealth (INDEPENDENT_AMBULATORY_CARE_PROVIDER_SITE_OTHER): Payer: Self-pay

## 2014-03-12 ENCOUNTER — Other Ambulatory Visit (INDEPENDENT_AMBULATORY_CARE_PROVIDER_SITE_OTHER): Payer: Self-pay

## 2014-03-12 NOTE — Telephone Encounter (Signed)
Spoke with Dr [name] he wants pt to have another round of abx. Rx for Doxycycline 100mg  BID #14 (7 days worth) called into [facility]. Informed pt that Rx was called into. Pt verbalized understanding.

## 2014-03-12 NOTE — Telephone Encounter (Signed)
Pt s/p colostomy reversal with open abdominal wound by Dr [name]. Pt was seen in urg office on 03/01/14 with abdominal wound draining. Pt was given Doxycycline to take for 10 days. Pt finished this up yesterday and she is calling today to inform us that wound is still draining and is red around the area. Pt wanted to know if Dr [name] wanted to call her in another round of abx. Informed pt that I would contact Dr [name] and I would get back in touch with her with his response. Pt verbalized understanding.

## 2014-03-17 ENCOUNTER — Ambulatory Visit (INDEPENDENT_AMBULATORY_CARE_PROVIDER_SITE_OTHER): Payer: Medicare Other | Admitting: General Surgery

## 2014-03-17 ENCOUNTER — Encounter (INDEPENDENT_AMBULATORY_CARE_PROVIDER_SITE_OTHER): Payer: Self-pay | Admitting: General Surgery

## 2014-03-17 VITALS — BP 133/84 | HR 77 | Temp 98.6°F | Resp 18

## 2014-03-17 DIAGNOSIS — Z5189 Encounter for other specified aftercare: Secondary | ICD-10-CM

## 2014-03-17 DIAGNOSIS — T8189XD Other complications of procedures, not elsewhere classified, subsequent encounter: Secondary | ICD-10-CM

## 2014-03-17 DIAGNOSIS — IMO0002 Reserved for concepts with insufficient information to code with codable children: Secondary | ICD-10-CM

## 2014-03-17 MED ORDER — "GAUZE DRESSING 4""X4"" PADS": 2.0000 | MEDICATED_PAD | Freq: Every day | Status: DC

## 2014-03-17 NOTE — Patient Instructions (Signed)
Change outer gauze daily or as needed Wick wound with packing strip and change daily, cover with dry gauze and paper tape

## 2014-03-19 NOTE — Progress Notes (Signed)
Subjective:     Patient ID: AZARIA STEGMAN, female   DOB: [birth year], 73 y.o.   MRN: [id number]  HPI 73 year old morbidly obese Caucasian female comes in for wound check. She has a history of intermittent drainage from her midline from her colostomy takedown. As a result of her wound VAC sponge not being adequately packed down to the level of her fascia. She had not had a problem since December of 2014. Apparently she had recurrent drainage recently and Wynetta Fines one of my partners in urgent office. He placed her on antibiotics. She denies any fevers or chills. She reports a good appetite. They're currently not packing the wound. It is covering with a dry gauze. She continues to have intermittent drainage however not as much and so was the other week. She reports no significant pain.  Review of Systems     Objective:   Physical Exam BP 133/84  Pulse 77  Temp(Src) 98.6 F (37 C) (Temporal)  Resp 18 Alert, no apparent distress, nontoxic Morbidly obese Abdomen-soft, nontender, nondistended. Midline scar healed by secondary intention. Upper midline ventral incisional hernia. Open wound in the lower midline. No cellulitis on the skin. Length of skin incision that is open is about 3 cm x 1.5 cm. It does track deep about 7 cm. No significant drainage.    Assessment:     Draining postoperative wound     Plan:     I do not think she needs any additional antibiotics. The culture did come back as MRSA. I've asked them to pack the wound with packing strip in order to keep the skin separated and try to obliterate the subcutaneous dead space. Unfortunately I don't have many other options for her. She has ventral hernias. I'm afraid if we open up the wound completely and wash it we will have to deal with her hernias. Given her obesity, inability to lose weight despite numerous counseling sessions, and infected wound, I think wound washout and hernia repair would have very high risk of infection and recurrence. My  recommendation was to continue to deal with the wound as needed. Followup 8 weeks  Leighton Ruff. [name], MD, FACS General, Bariatric, & Minimally Invasive Surgery [facility], [place]

## 2014-03-20 DIAGNOSIS — D473 Essential (hemorrhagic) thrombocythemia: Secondary | ICD-10-CM | POA: Insufficient documentation

## 2014-03-20 DIAGNOSIS — D75839 Thrombocytosis, unspecified: Secondary | ICD-10-CM | POA: Insufficient documentation

## 2014-03-22 ENCOUNTER — Encounter (HOSPITAL_COMMUNITY): Payer: Self-pay

## 2014-03-22 ENCOUNTER — Encounter (HOSPITAL_COMMUNITY): Payer: Medicare Other | Attending: Hematology and Oncology

## 2014-03-22 VITALS — BP 128/80 | HR 75 | Temp 97.7°F | Resp 22 | Wt 325.0 lb

## 2014-03-22 DIAGNOSIS — Z87891 Personal history of nicotine dependence: Secondary | ICD-10-CM | POA: Insufficient documentation

## 2014-03-22 DIAGNOSIS — T8131XA Disruption of external operation (surgical) wound, not elsewhere classified, initial encounter: Secondary | ICD-10-CM | POA: Insufficient documentation

## 2014-03-22 DIAGNOSIS — Y838 Other surgical procedures as the cause of abnormal reaction of the patient, or of later complication, without mention of misadventure at the time of the procedure: Secondary | ICD-10-CM | POA: Insufficient documentation

## 2014-03-22 DIAGNOSIS — T8149XA Infection following a procedure, other surgical site, initial encounter: Secondary | ICD-10-CM

## 2014-03-22 DIAGNOSIS — M1710 Unilateral primary osteoarthritis, unspecified knee: Secondary | ICD-10-CM | POA: Insufficient documentation

## 2014-03-22 DIAGNOSIS — IMO0001 Reserved for inherently not codable concepts without codable children: Secondary | ICD-10-CM

## 2014-03-22 DIAGNOSIS — T814XXS Infection following a procedure, sequela: Secondary | ICD-10-CM

## 2014-03-22 DIAGNOSIS — B9562 Methicillin resistant Staphylococcus aureus infection as the cause of diseases classified elsewhere: Secondary | ICD-10-CM | POA: Insufficient documentation

## 2014-03-22 DIAGNOSIS — J984 Other disorders of lung: Secondary | ICD-10-CM | POA: Insufficient documentation

## 2014-03-22 DIAGNOSIS — D75839 Thrombocytosis, unspecified: Secondary | ICD-10-CM

## 2014-03-22 DIAGNOSIS — D473 Essential (hemorrhagic) thrombocythemia: Secondary | ICD-10-CM | POA: Insufficient documentation

## 2014-03-22 DIAGNOSIS — E119 Type 2 diabetes mellitus without complications: Secondary | ICD-10-CM | POA: Insufficient documentation

## 2014-03-22 DIAGNOSIS — A4902 Methicillin resistant Staphylococcus aureus infection, unspecified site: Secondary | ICD-10-CM | POA: Insufficient documentation

## 2014-03-22 DIAGNOSIS — I10 Essential (primary) hypertension: Secondary | ICD-10-CM | POA: Insufficient documentation

## 2014-03-22 LAB — COMPREHENSIVE METABOLIC PANEL
ALK PHOS: 85 U/L (ref 39–117)
ALT: 16 U/L (ref 0–35)
AST: 14 U/L (ref 0–37)
Albumin: 3.4 g/dL — ABNORMAL LOW (ref 3.5–5.2)
BUN: 18 mg/dL (ref 6–23)
CALCIUM: 9.2 mg/dL (ref 8.4–10.5)
CO2: 31 meq/L (ref 19–32)
Chloride: 96 mEq/L (ref 96–112)
Creatinine, Ser: 1.05 mg/dL (ref 0.50–1.10)
GFR calc Af Amer: 59 mL/min — ABNORMAL LOW (ref >90)
GFR, EST NON AFRICAN AMERICAN: 51 mL/min — AB (ref >90)
Glucose, Bld: 116 mg/dL — ABNORMAL HIGH (ref 70–99)
POTASSIUM: 4.1 meq/L (ref 3.7–5.3)
SODIUM: 140 meq/L (ref 137–147)
TOTAL PROTEIN: 7.1 g/dL (ref 6.0–8.3)
Total Bilirubin: 0.5 mg/dL (ref 0.3–1.2)

## 2014-03-22 LAB — CBC WITH DIFFERENTIAL/PLATELET
BASOS PCT: 1 % (ref 0–1)
Basophils Absolute: 0.1 10*3/uL (ref 0.0–0.1)
EOS ABS: 1.1 10*3/uL — AB (ref 0.0–0.7)
Eosinophils Relative: 10 % — ABNORMAL HIGH (ref 0–5)
HEMATOCRIT: 37.7 % (ref 36.0–46.0)
HEMOGLOBIN: 11.3 g/dL — AB (ref 12.0–15.0)
LYMPHS PCT: 30 % (ref 12–46)
Lymphs Abs: 3.2 10*3/uL (ref 0.7–4.0)
MCH: 22.4 pg — ABNORMAL LOW (ref 26.0–34.0)
MCHC: 30 g/dL (ref 30.0–36.0)
MCV: 74.7 fL — ABNORMAL LOW (ref 78.0–100.0)
MONOS PCT: 7 % (ref 3–12)
Monocytes Absolute: 0.8 10*3/uL (ref 0.1–1.0)
NEUTROS PCT: 52 % (ref 43–77)
Neutro Abs: 5.6 10*3/uL (ref 1.7–7.7)
Platelets: 365 10*3/uL (ref 150–400)
RBC: 5.05 MIL/uL (ref 3.87–5.11)
RDW: 17.8 % — ABNORMAL HIGH (ref 11.5–15.5)
WBC: 10.8 10*3/uL — AB (ref 4.0–10.5)

## 2014-03-22 LAB — LACTATE DEHYDROGENASE: LDH: 208 U/L (ref 94–250)

## 2014-03-22 NOTE — Patient Instructions (Signed)
[facility] Discharge Instructions  RECOMMENDATIONS MADE BY THE CONSULTANT AND ANY TEST RESULTS WILL BE SENT TO YOUR REFERRING PHYSICIAN.  We will see you in 2 weeks for repeat lab work and a [facility] appointment. Please call for any questions or concerns.   Thank you for choosing [facility] to provide your oncology and hematology care.  To afford each patient quality time with our providers, please arrive at least 15 minutes before your scheduled appointment time.  With your help, our goal is to use those 15 minutes to complete the necessary work-up to ensure our physicians have the information they need to help with your evaluation and healthcare recommendations.    Effective January 1st, 2014, we ask that you re-schedule your appointment with our physicians should you arrive 10 or more minutes late for your appointment.  We strive to give you quality time with our providers, and arriving late affects you and other patients whose appointments are after yours.    Again, thank you for choosing [facility].  Our hope is that these requests will decrease the amount of time that you wait before being seen by our physicians.       _____________________________________________________________  Should you have questions after your visit to [facility], please contact our office at [phone] between the hours of 8:30 a.m. and 4:30 p.m.  Voicemails left after 4:30 p.m. will not be returned until the following business day.  For prescription refill requests, have your pharmacy contact our office with your prescription refill request.    _______________________________________________________________  We hope that we have given you very good care.  You may receive a patient satisfaction survey in the mail, please complete it and return it as soon as possible.  We value your  feedback!  _______________________________________________________________  Have you asked about our STAR program?  STAR stands for Survivorship Training and Rehabilitation, and this is a nationally recognized cancer care program that focuses on survivorship and rehabilitation.  Cancer and cancer treatments may cause problems, such as, pain, making you feel tired and keeping you from doing the things that you need or want to do. Cancer rehabilitation can help. Our goal is to reduce these troubling effects and help you have the best quality of life possible.  You may receive a survey from a nurse that asks questions about your current state of health.  Based on the survey results, all eligible patients will be referred to the [facility] program for an evaluation so we can better serve you!  A frequently asked questions sheet is available upon request.

## 2014-03-22 NOTE — Progress Notes (Signed)
[facility] A. [facility]et Glasgow, M.D.  NEW PATIENT EVALUATION   Name: [name] Date: 03/23/2014 MRN: [id number] DOB: 31-Dec-1940  PCP: [name], MD   REFERRING PHYSICIAN: Halford Chessman, MD  REASON FOR REFERRAL: Thrombocytosis     HISTORY OF PRESENT ILLNESS:[name] is a 73 y.o. female who is referred by her family physician because of thrombocytosis. The patient denies any increasing fatigue or evidence of bleeding such as melena, hematochezia, hematuria, vaginal bleeding, hemoptysis, or epistaxis. She also denies any fever, night sweats, and has had a drainage wound on the abdominal wall after closure of a colostomy which was constructed in the summer of 2013 because of ruptured diverticulitis. The colostomy was taken down in September 2013 but she subsequently developed a dehiscence with chronic drainage and currently is off antibiotics for the past 5 days. She was found to have an elevated platelet count and for that reason was referred for hematologic evaluation.   PAST MEDICAL HISTORY:  has a past medical history of Hypertension; Diabetes mellitus; Arthritis; Anemia; Diverticulitis; Shortness of breath; and Sleep apnea.     PAST SURGICAL HISTORY: Past Surgical History  Procedure Laterality Date  . Appendectomy    . Back surgery    . Colon resection  10/09/2011    Procedure: COLON RESECTION;  Surgeon: Gayland Curry, MD;  Location: [facility];  Service: General;  Laterality: N/A;  Colon Resection with colostomy  . Colostomy    . Jackson pratt    . Application of wound vac  wound vac  . Abdominal hysterectomy  1979  . Proctoscopy  06/26/2012    Procedure: PROCTOSCOPY;  Surgeon: Gayland Curry, MD,FACS;  Location: [facility];  Service: General;  Laterality: N/A;  Rigid Proctoscopy  . Colon resection  06/26/2012    Procedure: COLON RESECTION;  Surgeon: Gayland Curry, MD,FACS;  Location: [facility];  Service: General;  Laterality: N/A;   . Application of wound vac  06/26/2012    Procedure: APPLICATION OF WOUND VAC;  Surgeon: Gayland Curry, MD,FACS;  Location: [facility];  Service: General;;     CURRENT MEDICATIONS: has a current medication list which includes the following prescription(s): albuterol, aspirin, calcium-vitamin d, chromium-cinnamon, furosemide, gauze dressing, losartan, meloxicam, metformin, misc natural products, montelukast, oxymetazoline, sitagliptin, tramadol, vivelle-dot, and doxycycline.   ALLERGIES: Adhesive; Vancomycin; and Zosyn   SOCIAL HISTORY:  reports that she quit smoking about 2 years ago. She has never used smokeless tobacco. She reports that she does not drink alcohol or use illicit drugs.   FAMILY HISTORY: family history includes Diabetes in her brother, father, mother, and sister; Heart disease in her brother and father; Hypertension in her father and mother.    REVIEW OF SYSTEMS:  Other than that discussed above is noncontributory.    PHYSICAL EXAM:  weight is 325 lb (147.419 kg). Her oral temperature is 97.7 F (36.5 C). Her blood pressure is 128/80 and her pulse is 75. Her respiration is 22 and oxygen saturation is 92%.    GENERAL:alert, no distress and comfortable. Morbidly obese. SKIN: skin color, texture, turgor are normal, no rashes or significant lesions EYES: normal, Conjunctiva are pink and non-injected, sclera clear OROPHARYNX:no exudate, no erythema and lips, buccal mucosa, and tongue normal  NECK: supple, thyroid normal size, non-tender, without nodularity CHEST: Corpulent chest wall with no breast masses. LYMPH:  no palpable lymphadenopathy in the cervical, axillary or inguinal LUNGS: clear  to auscultation and percussion with normal breathing effort HEART: regular rate & rhythm and no murmurs ABDOMEN: Massive abdomen with midline surgical scar and left lateral small ventral hernia. An area of non-closure is evident in the lower abdominal incision without significant  drainage at this time, hemorrhage, or purulence. MUSCULOSKELETALl:no cyanosis of digits, no clubbing. Bilateral +2 pitting edema. Decreased range of motion right knee  NEURO: alert & oriented x 3 with fluent speech, no focal motor/sensory deficits    LABORATORY DATA:   03/03/2014: WBC 11,000, hemoglobin 0.1, platelets 626,000 with 6% eosinophils.   Office Visit on 03/22/2014  Component Date Value Ref Range Status  . WBC 03/22/2014 10.8* 4.0 - 10.5 K/uL Final  . RBC 03/22/2014 5.05  3.87 - 5.11 MIL/uL Final  . Hemoglobin 03/22/2014 11.3* 12.0 - 15.0 g/dL Final  . HCT 03/22/2014 37.7  36.0 - 46.0 % Final  . MCV 03/22/2014 74.7* 78.0 - 100.0 fL Final  . MCH 03/22/2014 22.4* 26.0 - 34.0 pg Final  . MCHC 03/22/2014 30.0  30.0 - 36.0 g/dL Final  . RDW 03/22/2014 17.8* 11.5 - 15.5 % Final  . Platelets 03/22/2014 365  150 - 400 K/uL Final  . Neutrophils Relative % 03/22/2014 52  43 - 77 % Final  . Lymphocytes Relative 03/22/2014 30  12 - 46 % Final  . Monocytes Relative 03/22/2014 7  3 - 12 % Final  . Eosinophils Relative 03/22/2014 10* 0 - 5 % Final  . Basophils Relative 03/22/2014 1  0 - 1 % Final  . Neutro Abs 03/22/2014 5.6  1.7 - 7.7 K/uL Final  . Lymphs Abs 03/22/2014 3.2  0.7 - 4.0 K/uL Final  . Monocytes Absolute 03/22/2014 0.8  0.1 - 1.0 K/uL Final  . Eosinophils Absolute 03/22/2014 1.1* 0.0 - 0.7 K/uL Final  . Basophils Absolute 03/22/2014 0.1  0.0 - 0.1 K/uL Final  . RBC Morphology 03/22/2014 POLYCHROMASIA PRESENT   Final  . Smear Review 03/22/2014 LARGE PLATELETS PRESENT   Final  . Sodium 03/22/2014 140  137 - 147 mEq/L Final  . Potassium 03/22/2014 4.1  3.7 - 5.3 mEq/L Final  . Chloride 03/22/2014 96  96 - 112 mEq/L Final  . CO2 03/22/2014 31  19 - 32 mEq/L Final  . Glucose, Bld 03/22/2014 116* 70 - 99 mg/dL Final  . BUN 03/22/2014 18  6 - 23 mg/dL Final  . Creatinine, Ser 03/22/2014 1.05  0.50 - 1.10 mg/dL Final  . Calcium 03/22/2014 9.2  8.4 - 10.5 mg/dL Final  . Total  Protein 03/22/2014 7.1  6.0 - 8.3 g/dL Final  . Albumin 03/22/2014 3.4* 3.5 - 5.2 g/dL Final  . AST 03/22/2014 14  0 - 37 U/L Final  . ALT 03/22/2014 16  0 - 35 U/L Final  . Alkaline Phosphatase 03/22/2014 85  39 - 117 U/L Final  . Total Bilirubin 03/22/2014 0.5  0.3 - 1.2 mg/dL Final  . GFR calc non Af Amer 03/22/2014 51* >90 mL/min Final  . GFR calc Af Amer 03/22/2014 59* >90 mL/min Final   Comment: (NOTE)                          The eGFR has been calculated using the CKD EPI equation.                          This calculation has not been validated in all clinical situations.  eGFR's persistently <90 mL/min signify possible Chronic Kidney                          Disease.  Marland Kitchen LDH 03/22/2014 208  94 - 250 U/L Final  . Ferritin 03/22/2014 11  10 - 291 ng/mL Final   Performed at [facility] Visit on 03/01/2014  Component Date Value Ref Range Status  . Culture 03/01/2014    Final                   Value:Moderate STREPTOCOCCUS GROUP C                         Abundant METHICILLIN RESISTANT STAPHYLOCOCCUS AUREUS  . Gram Stain 03/01/2014 Rare   Final  . Gram Stain 03/01/2014 WBC present-both PMN and Mononuclear   Final  . Gram Stain 03/01/2014 No Squamous Epithelial Cells Seen   Final  . Gram Stain 03/01/2014 Rare Gram Positive Cocci In Pairs   Final  . Organism ID, Bacteria 03/01/2014 STREPTOCOCCUS GROUP C   Final  . Organism ID, Bacteria 03/01/2014 Abundant METHICILLIN RESISTANT STAPHYLOCOCCUS AUREUS   Final   Comment: Rifampin and Gentamicin should not be used as                          single drugs for treatment of Staph infections.                          This organism DOES NOT demonstrate inducible                          Clindamycin resistance in vitro.    Urinalysis    Component Value Date/Time   COLORURINE YELLOW 03/14/2013 1430   APPEARANCEUR CLEAR 03/14/2013 1430   LABSPEC 1.019 03/14/2013 1430   PHURINE 5.5 03/14/2013 1430    GLUCOSEU NEGATIVE 03/14/2013 1430   HGBUR NEGATIVE 03/14/2013 1430   BILIRUBINUR NEGATIVE 03/14/2013 1430   KETONESUR NEGATIVE 03/14/2013 1430   PROTEINUR NEGATIVE 03/14/2013 1430   UROBILINOGEN 0.2 03/14/2013 1430   NITRITE NEGATIVE 03/14/2013 1430   LEUKOCYTESUR NEGATIVE 03/14/2013 1430      '@RADIOGRAPHY' : No results found.  PATHOLOGY: Peripheral smear failed to reveal evidence of premature forms.   IMPRESSION:  #1. Reactive thrombocytosis. #2. 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 #3. Morbid obesity. #4. 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 #5. Diabetes mellitus, type II, non-insulin requiring. #6. Hypertension, on treatment. #7. Hyperactive airway disease.   PLAN:  #1. Stool for fecal occult blood. #2. BCR-ABL and JAK-2 determination. #3. Followup in 2 weeks with CBC.  I appreciate the opportunity of sharing in her care.   Doroteo Bradford, MD 03/23/2014 6:39 AM   DISCLAIMER:  This note was dictated with voice recognition softwre.  Similar sounding words can inadvertently be transcribed inaccurately and may not be corrected upon review.

## 2014-03-22 NOTE — Progress Notes (Signed)
[name]'s reason for visit today are for labs as scheduled per MD orders.  Venipuncture performed with a 21 gauge butterfly needle to L Antecubital.  [name] tolerated venipuncture well and without incident; questions were answered and patient was discharged.

## 2014-03-23 LAB — FERRITIN: Ferritin: 11 ng/mL (ref 10–291)

## 2014-03-24 LAB — BCR/ABL GENE REARRANGEMENT QNT, PCR
BCR ABL1 / ABL1 IS: 0 %
BCR ABL1/ABL1: 0 %

## 2014-03-24 LAB — P190 BCR-ABL 1: P190 BCR ABL1: NOT DETECTED

## 2014-03-24 LAB — P210 BCR-ABL 1: P210 BCR ABL1: NOT DETECTED

## 2014-03-26 LAB — JAK2 GENOTYPR: JAK2 GENOTYPR: NOT DETECTED

## 2014-03-29 ENCOUNTER — Encounter (HOSPITAL_COMMUNITY): Payer: Medicare Other | Attending: Hematology and Oncology

## 2014-03-29 DIAGNOSIS — D509 Iron deficiency anemia, unspecified: Secondary | ICD-10-CM | POA: Diagnosis present

## 2014-03-29 DIAGNOSIS — D473 Essential (hemorrhagic) thrombocythemia: Secondary | ICD-10-CM | POA: Diagnosis not present

## 2014-03-29 DIAGNOSIS — Y838 Other surgical procedures as the cause of abnormal reaction of the patient, or of later complication, without mention of misadventure at the time of the procedure: Secondary | ICD-10-CM | POA: Insufficient documentation

## 2014-03-29 DIAGNOSIS — D75839 Thrombocytosis, unspecified: Secondary | ICD-10-CM

## 2014-03-29 DIAGNOSIS — D500 Iron deficiency anemia secondary to blood loss (chronic): Secondary | ICD-10-CM | POA: Diagnosis present

## 2014-03-29 LAB — OCCULT BLOOD X 1 CARD TO LAB, STOOL
FECAL OCCULT BLD: NEGATIVE
Fecal Occult Bld: NEGATIVE

## 2014-03-29 NOTE — Progress Notes (Signed)
LABS FOR OCCULT CARDSX2.

## 2014-04-07 ENCOUNTER — Encounter (HOSPITAL_BASED_OUTPATIENT_CLINIC_OR_DEPARTMENT_OTHER): Payer: Medicare Other

## 2014-04-07 ENCOUNTER — Encounter (HOSPITAL_COMMUNITY): Payer: Self-pay

## 2014-04-07 VITALS — BP 139/69 | HR 72 | Temp 97.8°F | Resp 20

## 2014-04-07 DIAGNOSIS — E611 Iron deficiency: Secondary | ICD-10-CM

## 2014-04-07 DIAGNOSIS — D473 Essential (hemorrhagic) thrombocythemia: Secondary | ICD-10-CM | POA: Diagnosis not present

## 2014-04-07 DIAGNOSIS — D500 Iron deficiency anemia secondary to blood loss (chronic): Secondary | ICD-10-CM

## 2014-04-07 DIAGNOSIS — D75839 Thrombocytosis, unspecified: Secondary | ICD-10-CM

## 2014-04-07 LAB — CBC WITH DIFFERENTIAL/PLATELET
BASOS PCT: 1 % (ref 0–1)
Basophils Absolute: 0.1 10*3/uL (ref 0.0–0.1)
EOS ABS: 0.6 10*3/uL (ref 0.0–0.7)
EOS PCT: 4 % (ref 0–5)
HEMATOCRIT: 36.9 % (ref 36.0–46.0)
Hemoglobin: 11.1 g/dL — ABNORMAL LOW (ref 12.0–15.0)
Lymphocytes Relative: 25 % (ref 12–46)
Lymphs Abs: 3.6 10*3/uL (ref 0.7–4.0)
MCH: 22.3 pg — ABNORMAL LOW (ref 26.0–34.0)
MCHC: 30.1 g/dL (ref 30.0–36.0)
MCV: 74.1 fL — ABNORMAL LOW (ref 78.0–100.0)
MONO ABS: 0.9 10*3/uL (ref 0.1–1.0)
MONOS PCT: 7 % (ref 3–12)
NEUTROS PCT: 64 % (ref 43–77)
Neutro Abs: 9.2 10*3/uL — ABNORMAL HIGH (ref 1.7–7.7)
Platelets: ADEQUATE 10*3/uL (ref 150–400)
RBC: 4.98 MIL/uL (ref 3.87–5.11)
RDW: 18.1 % — ABNORMAL HIGH (ref 11.5–15.5)
WBC: 14.4 10*3/uL — ABNORMAL HIGH (ref 4.0–10.5)

## 2014-04-07 NOTE — Progress Notes (Signed)
LABS FOR CBCD 

## 2014-04-07 NOTE — Patient Instructions (Addendum)
[facility] Discharge Instructions  RECOMMENDATIONS MADE BY THE CONSULTANT AND ANY TEST RESULTS WILL BE SENT TO YOUR REFERRING PHYSICIAN.  We will see you on July 20th and then again on July 27th. We will see you in 2 months for repeat labs and a doctor's appointment.    Thank you for choosing [facility] to provide your oncology and hematology care.  To afford each patient quality time with our providers, please arrive at least 15 minutes before your scheduled appointment time.  With your help, our goal is to use those 15 minutes to complete the necessary work-up to ensure our physicians have the information they need to help with your evaluation and healthcare recommendations.    Effective January 1st, 2014, we ask that you re-schedule your appointment with our physicians should you arrive 10 or more minutes late for your appointment.  We strive to give you quality time with our providers, and arriving late affects you and other patients whose appointments are after yours.    Again, thank you for choosing [facility].  Our hope is that these requests will decrease the amount of time that you wait before being seen by our physicians.       _____________________________________________________________  Should you have questions after your visit to [facility], please contact our office at [phone] between the hours of 8:30 a.m. and 4:30 p.m.  Voicemails left after 4:30 p.m. will not be returned until the following business day.  For prescription refill requests, have your pharmacy contact our office with your prescription refill request.    _______________________________________________________________  We hope that we have given you very good care.  You may receive a patient satisfaction survey in the mail, please complete it and return it as soon as possible.  We value your  feedback!  _______________________________________________________________  Have you asked about our STAR program?  STAR stands for Survivorship Training and Rehabilitation, and this is a nationally recognized cancer care program that focuses on survivorship and rehabilitation.  Cancer and cancer treatments may cause problems, such as, pain, making you feel tired and keeping you from doing the things that you need or want to do. Cancer rehabilitation can help. Our goal is to reduce these troubling effects and help you have the best quality of life possible.  You may receive a survey from a nurse that asks questions about your current state of health.  Based on the survey results, all eligible patients will be referred to the [facility] program for an evaluation so we can better serve you!  A frequently asked questions sheet is available upon request.

## 2014-04-07 NOTE — Progress Notes (Signed)
[facility]  OFFICE PROGRESS NOTE  [name], MD [address] [place] [address]  DIAGNOSIS: Thrombocytosis, reactive - Plan: CBC with Differential, Ferritin  Iron deficiency - Plan: CBC with Differential, Ferritin  Anemia due to chronic blood loss - Plan: CBC with Differential, Ferritin  Chief Complaint  Patient presents with  . Reactive thrombocytosis  . Deficiency due to chronic blood loss    CURRENT THERAPY: Followup after completion workup for thrombocytosis.  INTERVAL HISTORY: [name] 73 y.o. female returns for followup after additional workup for thrombocytosis.  Fetal occult blood testing x2 was negative. Evaluation of JAK-2 and BCR-ABL were both negative. Ferritin was found to be low at 11. No new complaints today abdominal viscera training. She denies any fever but does have a slight cough for which she is taking Levaquin and has 3 more days of treatment left. She denies any melena, hematochezia, hematuria, epistaxis, or hemoptysis. Urine is normal color. She does crave ice.  MEDICAL HISTORY: Past Medical History  Diagnosis Date  . Hypertension   . Diabetes mellitus   . Arthritis   . Anemia   . Diverticulitis   . Shortness of breath     with exertion   . Sleep apnea     cpap at 10     INTERIM HISTORY: has OBSTRUCTIVE SLEEP APNEA; Diverticulitis of colon with perforation sigmoid colectomy and colostomy 1/15; Hypertension; Diabetes mellitus; Obesity, Class III, BMI 40-49.9 (morbid obesity); S/P colon resection; Draining postoperative wound; Cellulitis; Incisional hernia without mention of obstruction or gangrene; Thrombocytosis; and Postoperative MRSA infection of lower abdominal wound on her problem list.    ALLERGIES:  is allergic to adhesive; vancomycin; and zosyn.  MEDICATIONS: has a current medication list which includes the following prescription(s): albuterol, aspirin, calcium-vitamin d,  chromium-cinnamon, furosemide, gauze dressing, levofloxacin, losartan, meloxicam, metformin, misc natural products, montelukast, oxymetazoline, sitagliptin, tramadol, vivelle-dot, and doxycycline.  SURGICAL HISTORY:  Past Surgical History  Procedure Laterality Date  . Appendectomy    . Back surgery    . Colon resection  10/09/2011    Procedure: COLON RESECTION;  Surgeon: Gayland Curry, MD;  Location: [facility];  Service: General;  Laterality: N/A;  Colon Resection with colostomy  . Colostomy    . Jackson pratt    . Application of wound vac  wound vac  . Abdominal hysterectomy  1979  . Proctoscopy  06/26/2012    Procedure: PROCTOSCOPY;  Surgeon: Gayland Curry, MD,FACS;  Location: [facility];  Service: General;  Laterality: N/A;  Rigid Proctoscopy  . Colon resection  06/26/2012    Procedure: COLON RESECTION;  Surgeon: Gayland Curry, MD,FACS;  Location: [facility];  Service: General;  Laterality: N/A;  . Application of wound vac  06/26/2012    Procedure: APPLICATION OF WOUND VAC;  Surgeon: Gayland Curry, MD,FACS;  Location: [facility];  Service: General;;    FAMILY HISTORY: family history includes Diabetes in her brother, father, mother, and sister; Heart disease in her brother and father; Hypertension in her father and mother.  SOCIAL HISTORY:  reports that she quit smoking about 2 years ago. She has never used smokeless tobacco. She reports that she does not drink alcohol or use illicit drugs.  REVIEW OF SYSTEMS:  Other than that discussed above is noncontributory.  PHYSICAL EXAMINATION: ECOG PERFORMANCE STATUS: 1 - Symptomatic but completely ambulatory  Blood pressure 139/69, pulse 72, temperature 97.8 F (36.6 C), temperature source  Oral, resp. rate 20, SpO2 96.00%.  GENERAL:alert, no distress and comfortable. Morbidly obese. SKIN: skin color, texture, turgor are normal, no rashes or significant lesions EYES: PERLA; Conjunctiva are pink and non-injected, sclera clear SINUSES: No redness or  tenderness over maxillary or ethmoid sinuses OROPHARYNX:no exudate, no erythema on lips, buccal mucosa, or tongue. NECK: supple, thyroid normal size, non-tender, without nodularity. No masses CHEST: Normal AP diameter with no breast masses. LYMPH:  no palpable lymphadenopathy in the cervical, axillary or inguinal LUNGS: clear to auscultation and percussion with normal breathing effort HEART: regular rate & rhythm and no murmurs. ABDOMEN: Obese and distended with dressing in  place. MUSCULOSKELETAL:no cyanosis of digits and no clubbing. Range of motion normal.  NEURO: alert & oriented x 3 with fluent speech, no focal motor/sensory deficits   LABORATORY DATA: Lab on 04/07/2014  Component Date Value Ref Range Status  . WBC 04/07/2014 14.4* 4.0 - 10.5 K/uL Final  . RBC 04/07/2014 4.98  3.87 - 5.11 MIL/uL Final  . Hemoglobin 04/07/2014 11.1* 12.0 - 15.0 g/dL Final  . HCT 04/07/2014 36.9  36.0 - 46.0 % Final  . MCV 04/07/2014 74.1* 78.0 - 100.0 fL Final  . MCH 04/07/2014 22.3* 26.0 - 34.0 pg Final  . MCHC 04/07/2014 30.1  30.0 - 36.0 g/dL Final  . RDW 04/07/2014 18.1* 11.5 - 15.5 % Final  . Platelets 04/07/2014 PLATELET CLUMPS NOTED ON SMEAR, COUNT APPEARS ADEQUATE  150 - 400 K/uL Final  . Neutrophils Relative % 04/07/2014 64  43 - 77 % Final  . Neutro Abs 04/07/2014 9.2* 1.7 - 7.7 K/uL Final  . Lymphocytes Relative 04/07/2014 25  12 - 46 % Final  . Lymphs Abs 04/07/2014 3.6  0.7 - 4.0 K/uL Final  . Monocytes Relative 04/07/2014 7  3 - 12 % Final  . Monocytes Absolute 04/07/2014 0.9  0.1 - 1.0 K/uL Final  . Eosinophils Relative 04/07/2014 4  0 - 5 % Final  . Eosinophils Absolute 04/07/2014 0.6  0.0 - 0.7 K/uL Final  . Basophils Relative 04/07/2014 1  0 - 1 % Final  . Basophils Absolute 04/07/2014 0.1  0.0 - 0.1 K/uL Final  Lab on 03/29/2014  Component Date Value Ref Range Status  . Fecal Occult Bld 03/29/2014 NEGATIVE  NEGATIVE Final  . Fecal Occult Bld 03/29/2014 NEGATIVE  NEGATIVE  Final  Office Visit on 03/22/2014  Component Date Value Ref Range Status  . WBC 03/22/2014 10.8* 4.0 - 10.5 K/uL Final  . RBC 03/22/2014 5.05  3.87 - 5.11 MIL/uL Final  . Hemoglobin 03/22/2014 11.3* 12.0 - 15.0 g/dL Final  . HCT 03/22/2014 37.7  36.0 - 46.0 % Final  . MCV 03/22/2014 74.7* 78.0 - 100.0 fL Final  . MCH 03/22/2014 22.4* 26.0 - 34.0 pg Final  . MCHC 03/22/2014 30.0  30.0 - 36.0 g/dL Final  . RDW 03/22/2014 17.8* 11.5 - 15.5 % Final  . Platelets 03/22/2014 365  150 - 400 K/uL Final  . Neutrophils Relative % 03/22/2014 52  43 - 77 % Final  . Lymphocytes Relative 03/22/2014 30  12 - 46 % Final  . Monocytes Relative 03/22/2014 7  3 - 12 % Final  . Eosinophils Relative 03/22/2014 10* 0 - 5 % Final  . Basophils Relative 03/22/2014 1  0 - 1 % Final  . Neutro Abs 03/22/2014 5.6  1.7 - 7.7 K/uL Final  . Lymphs Abs 03/22/2014 3.2  0.7 - 4.0 K/uL Final  . Monocytes Absolute 03/22/2014 0.8  0.1 - 1.0 K/uL Final  . Eosinophils Absolute 03/22/2014 1.1* 0.0 - 0.7 K/uL Final  . Basophils Absolute 03/22/2014 0.1  0.0 - 0.1 K/uL Final  . RBC Morphology 03/22/2014 POLYCHROMASIA PRESENT   Final  . Smear Review 03/22/2014 LARGE PLATELETS PRESENT   Final  . Sodium 03/22/2014 140  137 - 147 mEq/L Final  . Potassium 03/22/2014 4.1  3.7 - 5.3 mEq/L Final  . Chloride 03/22/2014 96  96 - 112 mEq/L Final  . CO2 03/22/2014 31  19 - 32 mEq/L Final  . Glucose, Bld 03/22/2014 116* 70 - 99 mg/dL Final  . BUN 03/22/2014 18  6 - 23 mg/dL Final  . Creatinine, Ser 03/22/2014 1.05  0.50 - 1.10 mg/dL Final  . Calcium 03/22/2014 9.2  8.4 - 10.5 mg/dL Final  . Total Protein 03/22/2014 7.1  6.0 - 8.3 g/dL Final  . Albumin 03/22/2014 3.4* 3.5 - 5.2 g/dL Final  . AST 03/22/2014 14  0 - 37 U/L Final  . ALT 03/22/2014 16  0 - 35 U/L Final  . Alkaline Phosphatase 03/22/2014 85  39 - 117 U/L Final  . Total Bilirubin 03/22/2014 0.5  0.3 - 1.2 mg/dL Final  . GFR calc non Af Amer 03/22/2014 51* >90 mL/min Final  .  GFR calc Af Amer 03/22/2014 59* >90 mL/min Final   Comment: (NOTE)                          The eGFR has been calculated using the CKD EPI equation.                          This calculation has not been validated in all clinical situations.                          eGFR's persistently <90 mL/min signify possible Chronic Kidney                          Disease.  Marland Kitchen LDH 03/22/2014 208  94 - 250 U/L Final  . Ferritin 03/22/2014 11  10 - 291 ng/mL Final   Performed at [facility]  . BCR ABL1 / ABL1 03/22/2014 0.000   Final  . BCR ABL1 / ABL1 IS 03/22/2014 0.000   Final  . Interpretation - BCRQ 03/22/2014 REPORT   Final   Comment: (NOTE)                          The P190 and P210 BCR-ABL1 fusion transcripts are NOT                          detected.                          Reverse transcription real-time PCR is performed for                          the P190 and P210 BCR-ABL1 transcripts associated with                          the t(9;22) chromosomal translocation. For P190,  results are expressed as a percent ratio of BCR-ABL1                          to the ABL1 transcript, and further adjusted to the                          international scale (IS) for P210.                          Assay sensitivity is dependent on RNA quality and                          sample cellularity but is usually at least 4-logs                          below BCR-ABL1 baseline transcript levels. Reference                          range is 0.000% BCR-ABL1/ABL1.                          This test was developed and its performance                          characteristics have been determined by [employer], [place], [place].                          Performance characteristics refer to the                          analytical performance of the test.                                                                               Yvetta Coder, M.D., Ph.D                          Medical Director, Molecular Oncology                          Performed at [facility]  . JAK2 GenotypR 03/22/2014 Not Detected   Final   Comment: (NOTE)                                   ** Normal Reference Range: Not Detected **                          Clinical Utility:  The somatic acquired mutation affecting Janus Tyrosine Kinase 2 (JAK2                          V617F) in exon 14 is associated with myeloproliferative disorders                          (MPD).  JAK2 V617F has been found to be the most common molecular                          abnormality in patients with Polycythemia Vera (PV, >90%) or Essential                          Thombocythemia (ET, 35% - 70%).  The lowest frequency is found in IMF                          patients (chronic Idiopathic Myelofibrosis, 50%).  The presence of the                          JAK2 mutation causes activation of molecular signals that lead to                          proliferation of hematopoietic precursors outside of their normal                          pathways including erythropoietin-independent erythroid colony growth                          in most patients with PV and some patients with ET.  The JAK2 mutation                          is considered the main oncogenic event responsible for PV development                          but its precise role in ET and IMF remains questionable and may                          suggest the requirement of other genetic events to induce these                          pathologies.  The absence of JAK2 V617F does not exclude other                          changes, including in the exon 12.                          Test Methodology:                          Patient DNA is assayed using allele specific PCR technology from                          Qiagen and is tested using the Roche Light Cycler Real Time  PCR                           analyzer. This assay is reported as detected when >5% of cells show                          the presence of the JAK2 V617F mutation.                          This test was performed using a kit that has not been cleared or                          approved by the FDA.  The analytical performance characteristics of                          this test have been determined by [facility].  This                          test may not be used for diagnostic, prognostic or monitoring purposes                          without confirmation by other medically established means.                          Performed at [facility]  . P190 BCR ABL1 03/22/2014 Not Detected   Final   Performed at [facility]  . P210 BCR ABL1 03/22/2014 Not Detected   Final   Performed at [facility]: No new pathology.  Urinalysis    Component Value Date/Time   COLORURINE YELLOW 03/14/2013 1430   APPEARANCEUR CLEAR 03/14/2013 1430   LABSPEC 1.019 03/14/2013 1430   PHURINE 5.5 03/14/2013 1430   GLUCOSEU NEGATIVE 03/14/2013 1430   HGBUR NEGATIVE 03/14/2013 1430   BILIRUBINUR NEGATIVE 03/14/2013 1430   KETONESUR NEGATIVE 03/14/2013 1430   PROTEINUR NEGATIVE 03/14/2013 1430   UROBILINOGEN 0.2 03/14/2013 1430   NITRITE NEGATIVE 03/14/2013 1430   LEUKOCYTESUR NEGATIVE 03/14/2013 1430    RADIOGRAPHIC STUDIES: No results found.  ASSESSMENT:  #1. Chronic blood loss with iron deficiency, etiology obscure with reactive thrombocytosis. #2. 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  #3. Morbid obesity.  #4. 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  #5. Diabetes mellitus, type II, non-insulin requiring.  #6. Hypertension, on treatment.  #7. Hyperactive airway disease    PLAN:  #1. Intravenous Feraheme on 04/12/2014 and 04/19/2014. #2. Followup in 2 months with CBC and ferritin.   All questions were answered. The patient knows to call the clinic with any  problems, questions or concerns. We can certainly see the patient much sooner if necessary.   I spent 25 minutes counseling the patient face to face. The total time spent in the appointment was 30 minutes.    Doroteo Bradford, MD 04/07/2014 2:43 PM  DISCLAIMER:  This note was dictated with voice recognition software.  Similar sounding words can inadvertently be transcribed inaccurately and may not be corrected upon review.

## 2014-04-12 ENCOUNTER — Encounter (HOSPITAL_BASED_OUTPATIENT_CLINIC_OR_DEPARTMENT_OTHER): Payer: Medicare Other

## 2014-04-12 VITALS — BP 140/60 | HR 72 | Temp 97.7°F | Resp 20

## 2014-04-12 DIAGNOSIS — D500 Iron deficiency anemia secondary to blood loss (chronic): Secondary | ICD-10-CM

## 2014-04-12 MED ORDER — SODIUM CHLORIDE 0.9 % IV SOLN
Freq: Once | INTRAVENOUS | Status: AC
  Administered 2014-04-12: 13:00:00 via INTRAVENOUS

## 2014-04-12 MED ORDER — SODIUM CHLORIDE 0.9 % IV SOLN
510.0000 mg | Freq: Once | INTRAVENOUS | Status: AC
  Administered 2014-04-12: 510 mg via INTRAVENOUS
  Filled 2014-04-12: qty 17

## 2014-04-12 MED ORDER — SODIUM CHLORIDE 0.9 % IJ SOLN
10.0000 mL | INTRAMUSCULAR | Status: DC | PRN
  Administered 2014-04-12: 10 mL

## 2014-04-19 ENCOUNTER — Encounter (HOSPITAL_BASED_OUTPATIENT_CLINIC_OR_DEPARTMENT_OTHER): Payer: Medicare Other

## 2014-04-19 VITALS — BP 134/64 | HR 77 | Temp 98.3°F | Resp 20

## 2014-04-19 DIAGNOSIS — D500 Iron deficiency anemia secondary to blood loss (chronic): Secondary | ICD-10-CM

## 2014-04-19 MED ORDER — SODIUM CHLORIDE 0.9 % IV SOLN
510.0000 mg | Freq: Once | INTRAVENOUS | Status: AC
  Administered 2014-04-19: 510 mg via INTRAVENOUS
  Filled 2014-04-19: qty 17

## 2014-04-19 MED ORDER — SODIUM CHLORIDE 0.9 % IJ SOLN: 10.0000 mL | INTRAMUSCULAR | Status: DC | PRN

## 2014-04-19 MED ORDER — SODIUM CHLORIDE 0.9 % IV SOLN
Freq: Once | INTRAVENOUS | Status: AC
  Administered 2014-04-19: 13:00:00 via INTRAVENOUS

## 2014-04-19 NOTE — Progress Notes (Signed)
Patient tolerated infusion well.  Escorted out in wheelchair.

## 2014-05-19 ENCOUNTER — Ambulatory Visit (INDEPENDENT_AMBULATORY_CARE_PROVIDER_SITE_OTHER): Payer: Medicare Other | Admitting: General Surgery

## 2014-05-19 ENCOUNTER — Encounter (INDEPENDENT_AMBULATORY_CARE_PROVIDER_SITE_OTHER): Payer: Self-pay | Admitting: General Surgery

## 2014-05-19 VITALS — BP 158/80 | HR 81 | Temp 97.2°F | Ht 65.0 in | Wt 323.0 lb

## 2014-05-19 DIAGNOSIS — IMO0002 Reserved for concepts with insufficient information to code with codable children: Secondary | ICD-10-CM

## 2014-05-19 DIAGNOSIS — T8189XD Other complications of procedures, not elsewhere classified, subsequent encounter: Secondary | ICD-10-CM

## 2014-05-19 DIAGNOSIS — Z5189 Encounter for other specified aftercare: Secondary | ICD-10-CM

## 2014-05-20 NOTE — Progress Notes (Signed)
Subjective:     Patient ID: [name], female   DOB: 07/18/1941, 73 y.o.   MRN: [id number]  HPI 73 year old morbidly obese Caucasian female comes in for long-term followup regarding her intermittent draining from her midline incision. She states things are going well. However she was recently diagnosed with a pneumonia and is on Levaquin. She states that her husband has continued to do daily packing strips to the open sinus in her lower abdomen in her lower pannus. There has been mainly just blood-tinged serosanguineous drainage. No purulent drainage. She had a little bit of left-sided abdominal pain last week. She reports daily bowel movements.  Review of Systems     Objective:   Physical Exam BP 158/80  Pulse 81  Temp(Src) 97.2 F (36.2 C)  Ht 5\' 5"  (1.651 m)  Wt 323 lb (146.512 kg)  BMI 53.75 kg/m2 mmorbidly obese Caucasian female in no apparent distress Abdomen-soft, nontender, nondistended. Midline incision with the old ostomy scar in the left midabdomen. In the lower pannus as there is a small opening in the skin incision of about 1-1/2 cm. It does track about 4 cm deep. There is no surrounding cellulitis    Assessment:     Chronic midline intermittent draining wound     Plan:     There is no cellulitis. I don't get any purulent drainage. I explained to the patient's husband that they'll require less and less packing. There is really no good other option. More than likely a recurrence some point in the future. Followup in 8-10 weeks  Leighton Ruff. [name], MD, FACS General, Bariatric, & Minimally Invasive Surgery [facility], [place]

## 2014-05-25 DIAGNOSIS — J40 Bronchitis, not specified as acute or chronic: Secondary | ICD-10-CM

## 2014-05-25 HISTORY — DX: Bronchitis, not specified as acute or chronic: J40

## 2014-06-01 ENCOUNTER — Ambulatory Visit (INDEPENDENT_AMBULATORY_CARE_PROVIDER_SITE_OTHER): Payer: Medicare Other

## 2014-06-01 DIAGNOSIS — E1140 Type 2 diabetes mellitus with diabetic neuropathy, unspecified: Secondary | ICD-10-CM

## 2014-06-01 DIAGNOSIS — L608 Other nail disorders: Secondary | ICD-10-CM

## 2014-06-01 DIAGNOSIS — E1142 Type 2 diabetes mellitus with diabetic polyneuropathy: Secondary | ICD-10-CM

## 2014-06-01 DIAGNOSIS — E1149 Type 2 diabetes mellitus with other diabetic neurological complication: Secondary | ICD-10-CM

## 2014-06-01 NOTE — Progress Notes (Signed)
   Subjective:    Patient ID: [name], female    DOB: 1940/10/19, 73 y.o.   MRN: [id number]  HPI Comments: Pt presents for debridement of 10 toenails.     Review of Systems no new findings or systemic changes noted     Objective:   Physical Exam Lower extremity objective findings as follows pedal pulses are palpable DP +2/4 bilateral PT one over 4 bilateral capillary fill time 3 seconds all digits epicritic and proprioceptive sensations decreased Semmes Weinstein to forefoot heel and arch. There is normal plantar response DTRs not listed neurologically skin color pigment normal hair growth absent nails thick brittle criptotic incurvated friable 1 through 5 bilateral no open wounds or ulcers no secondary infections both hallux nails tender to ingrown medial border and proptosis the painful and symptomatic in the nail folds patient wearing appropriate shoes no open wounds or ulcers noted no signs of infection       Assessment & Plan:  Assessment this time diabetes with history of neuropathy. Thick brittle dystrophic criptotic nails debridement presence of diabetes and complications return for future palliative nail care as needed patient walks with the assistance of a walker in no ulcers no secondary infection is noted recheck in 3 months  Harriet Masson DPM

## 2014-06-01 NOTE — Patient Instructions (Signed)
Diabetes and Foot Care Diabetes may cause you to have problems because of poor blood supply (circulation) to your feet and legs. This may cause the skin on your feet to become thinner, break easier, and heal more slowly. Your skin may become dry, and the skin may peel and crack. You may also have nerve damage in your legs and feet causing decreased feeling in them. You may not notice minor injuries to your feet that could lead to infections or more serious problems. Taking care of your feet is one of the most important things you can do for yourself.  HOME CARE INSTRUCTIONS  Wear shoes at all times, even in the house. Do not go barefoot. Bare feet are easily injured.  Check your feet daily for blisters, cuts, and redness. If you cannot see the bottom of your feet, use a mirror or ask someone for help.  Wash your feet with warm water (do not use hot water) and mild soap. Then pat your feet and the areas between your toes until they are completely dry. Do not soak your feet as this can dry your skin.  Apply a moisturizing lotion or petroleum jelly (that does not contain alcohol and is unscented) to the skin on your feet and to dry, brittle toenails. Do not apply lotion between your toes.  Trim your toenails straight across. Do not dig under them or around the cuticle. File the edges of your nails with an emery board or nail file.  Do not cut corns or calluses or try to remove them with medicine.  Wear clean socks or stockings every day. Make sure they are not too tight. Do not wear knee-high stockings since they may decrease blood flow to your legs.  Wear shoes that fit properly and have enough cushioning. To break in new shoes, wear them for just a few hours a day. This prevents you from injuring your feet. Always look in your shoes before you put them on to be sure there are no objects inside.  Do not cross your legs. This may decrease the blood flow to your feet.  If you find a minor scrape,  cut, or break in the skin on your feet, keep it and the skin around it clean and dry. These areas may be cleansed with mild soap and water. Do not cleanse the area with peroxide, alcohol, or iodine.  When you remove an adhesive bandage, be sure not to damage the skin around it.  If you have a wound, look at it several times a day to make sure it is healing.  Do not use heating pads or hot water bottles. They may burn your skin. If you have lost feeling in your feet or legs, you may not know it is happening until it is too late.  Make sure your health care provider performs a complete foot exam at least annually or more often if you have foot problems. Report any cuts, sores, or bruises to your health care provider immediately. SEEK MEDICAL CARE IF:   You have an injury that is not healing.  You have cuts or breaks in the skin.  You have an ingrown nail.  You notice redness on your legs or feet.  You feel burning or tingling in your legs or feet.  You have pain or cramps in your legs and feet.  Your legs or feet are numb.  Your feet always feel cold. SEEK IMMEDIATE MEDICAL CARE IF:   There is increasing redness,   swelling, or pain in or around a wound.  There is a red line that goes up your leg.  Pus is coming from a wound.  You develop a fever or as directed by your health care provider.  You notice a bad smell coming from an ulcer or wound. Document Released: 09/07/2000 Document Revised: 05/13/2013 Document Reviewed: 02/17/2013 ExitCare Patient Information 2015 [employer]. This information is not intended to replace advice given to you by your health care provider. Make sure you discuss any questions you have with your health care provider.  

## 2014-06-05 ENCOUNTER — Inpatient Hospital Stay (HOSPITAL_COMMUNITY)
Admission: EM | Admit: 2014-06-05 | Discharge: 2014-06-08 | DRG: 190 | Disposition: A | Discharge status: Home / Self Care | Payer: Medicare Other | Attending: Internal Medicine | Admitting: Internal Medicine

## 2014-06-05 ENCOUNTER — Emergency Department (HOSPITAL_COMMUNITY): Payer: Medicare Other

## 2014-06-05 DIAGNOSIS — R0902 Hypoxemia: Secondary | ICD-10-CM | POA: Diagnosis present

## 2014-06-05 DIAGNOSIS — J449 Chronic obstructive pulmonary disease, unspecified: Secondary | ICD-10-CM | POA: Diagnosis present

## 2014-06-05 DIAGNOSIS — G4733 Obstructive sleep apnea (adult) (pediatric): Secondary | ICD-10-CM | POA: Diagnosis present

## 2014-06-05 DIAGNOSIS — M129 Arthropathy, unspecified: Secondary | ICD-10-CM | POA: Diagnosis present

## 2014-06-05 DIAGNOSIS — Z7982 Long term (current) use of aspirin: Secondary | ICD-10-CM | POA: Diagnosis not present

## 2014-06-05 DIAGNOSIS — E875 Hyperkalemia: Secondary | ICD-10-CM | POA: Diagnosis present

## 2014-06-05 DIAGNOSIS — T380X5A Adverse effect of glucocorticoids and synthetic analogues, initial encounter: Secondary | ICD-10-CM | POA: Diagnosis present

## 2014-06-05 DIAGNOSIS — E119 Type 2 diabetes mellitus without complications: Secondary | ICD-10-CM | POA: Diagnosis present

## 2014-06-05 DIAGNOSIS — J441 Chronic obstructive pulmonary disease with (acute) exacerbation: Secondary | ICD-10-CM | POA: Diagnosis not present

## 2014-06-05 DIAGNOSIS — J9600 Acute respiratory failure, unspecified whether with hypoxia or hypercapnia: Secondary | ICD-10-CM | POA: Diagnosis present

## 2014-06-05 DIAGNOSIS — Z6841 Body Mass Index (BMI) 40.0 and over, adult: Secondary | ICD-10-CM | POA: Diagnosis not present

## 2014-06-05 DIAGNOSIS — Z79899 Other long term (current) drug therapy: Secondary | ICD-10-CM | POA: Diagnosis not present

## 2014-06-05 DIAGNOSIS — I10 Essential (primary) hypertension: Secondary | ICD-10-CM | POA: Diagnosis present

## 2014-06-05 DIAGNOSIS — Z87891 Personal history of nicotine dependence: Secondary | ICD-10-CM | POA: Diagnosis not present

## 2014-06-05 DIAGNOSIS — R0602 Shortness of breath: Secondary | ICD-10-CM | POA: Diagnosis not present

## 2014-06-05 LAB — BASIC METABOLIC PANEL
ANION GAP: 15 (ref 5–15)
BUN: 19 mg/dL (ref 6–23)
CALCIUM: 9.9 mg/dL (ref 8.4–10.5)
CHLORIDE: 98 meq/L (ref 96–112)
CO2: 28 mEq/L (ref 19–32)
CREATININE: 0.88 mg/dL (ref 0.50–1.10)
GFR, EST AFRICAN AMERICAN: 73 mL/min — AB (ref >90)
GFR, EST NON AFRICAN AMERICAN: 63 mL/min — AB (ref >90)
Glucose, Bld: 136 mg/dL — ABNORMAL HIGH (ref 70–99)
Potassium: 4.4 mEq/L (ref 3.7–5.3)
Sodium: 141 mEq/L (ref 137–147)

## 2014-06-05 LAB — CBC
HCT: 45.3 % (ref 36.0–46.0)
Hemoglobin: 14.1 g/dL (ref 12.0–15.0)
MCH: 26.5 pg (ref 26.0–34.0)
MCHC: 31.1 g/dL (ref 30.0–36.0)
MCV: 85.2 fL (ref 78.0–100.0)
Platelets: 276 10*3/uL (ref 150–400)
RBC: 5.32 MIL/uL — ABNORMAL HIGH (ref 3.87–5.11)
RDW: 21.7 % — ABNORMAL HIGH (ref 11.5–15.5)
WBC: 12.6 10*3/uL — ABNORMAL HIGH (ref 4.0–10.5)

## 2014-06-05 LAB — I-STAT TROPONIN, ED: Troponin i, poc: 0 ng/mL (ref 0.00–0.08)

## 2014-06-05 LAB — D-DIMER, QUANTITATIVE: D-Dimer, Quant: 0.72 ug/mL-FEU — ABNORMAL HIGH (ref 0.00–0.48)

## 2014-06-05 LAB — PRO B NATRIURETIC PEPTIDE: PRO B NATRI PEPTIDE: 64.6 pg/mL (ref 0–125)

## 2014-06-05 MED ORDER — ALUM & MAG HYDROXIDE-SIMETH 200-200-20 MG/5ML PO SUSP
30.0000 mL | Freq: Four times a day (QID) | ORAL | Status: DC | PRN
  Administered 2014-06-07 – 2014-06-08 (×2): 30 mL via ORAL
  Filled 2014-06-05 (×3): qty 30

## 2014-06-05 MED ORDER — FUROSEMIDE 20 MG PO TABS
20.0000 mg | ORAL_TABLET | ORAL | Status: DC | PRN
  Filled 2014-06-05: qty 1

## 2014-06-05 MED ORDER — DIPHENHYDRAMINE HCL 25 MG PO CAPS
25.0000 mg | ORAL_CAPSULE | Freq: Once | ORAL | Status: AC
  Administered 2014-06-05: 25 mg via ORAL
  Filled 2014-06-05: qty 1

## 2014-06-05 MED ORDER — ACETAMINOPHEN 325 MG PO TABS
650.0000 mg | ORAL_TABLET | Freq: Four times a day (QID) | ORAL | Status: DC | PRN
  Administered 2014-06-06: 650 mg via ORAL
  Filled 2014-06-05: qty 2

## 2014-06-05 MED ORDER — ALBUTEROL SULFATE (2.5 MG/3ML) 0.083% IN NEBU: 2.5000 mg | INHALATION_SOLUTION | Freq: Four times a day (QID) | RESPIRATORY_TRACT | Status: DC

## 2014-06-05 MED ORDER — ONDANSETRON HCL 4 MG PO TABS: 4.0000 mg | ORAL_TABLET | Freq: Four times a day (QID) | ORAL | Status: DC | PRN

## 2014-06-05 MED ORDER — ASPIRIN 81 MG PO CHEW
162.0000 mg | CHEWABLE_TABLET | Freq: Every day | ORAL | Status: DC
  Administered 2014-06-06 – 2014-06-08 (×3): 162 mg via ORAL
  Filled 2014-06-05 (×3): qty 2

## 2014-06-05 MED ORDER — METFORMIN HCL 500 MG PO TABS
1000.0000 mg | ORAL_TABLET | Freq: Two times a day (BID) | ORAL | Status: DC
  Administered 2014-06-06 – 2014-06-08 (×5): 1000 mg via ORAL
  Filled 2014-06-05 (×7): qty 2

## 2014-06-05 MED ORDER — METHYLPREDNISOLONE SODIUM SUCC 125 MG IJ SOLR
125.0000 mg | Freq: Once | INTRAMUSCULAR | Status: AC
  Administered 2014-06-05: 125 mg via INTRAVENOUS
  Filled 2014-06-05: qty 2

## 2014-06-05 MED ORDER — GUAIFENESIN-DM 100-10 MG/5ML PO SYRP
5.0000 mL | ORAL_SOLUTION | ORAL | Status: DC | PRN
  Administered 2014-06-07 – 2014-06-08 (×3): 5 mL via ORAL
  Filled 2014-06-05 (×3): qty 10

## 2014-06-05 MED ORDER — LOSARTAN POTASSIUM 50 MG PO TABS
100.0000 mg | ORAL_TABLET | Freq: Every morning | ORAL | Status: DC
  Administered 2014-06-06 – 2014-06-08 (×3): 100 mg via ORAL
  Filled 2014-06-05 (×3): qty 2

## 2014-06-05 MED ORDER — ONDANSETRON HCL 4 MG/2ML IJ SOLN
4.0000 mg | Freq: Four times a day (QID) | INTRAMUSCULAR | Status: DC | PRN
  Administered 2014-06-07: 4 mg via INTRAVENOUS
  Filled 2014-06-05: qty 2

## 2014-06-05 MED ORDER — CETYLPYRIDINIUM CHLORIDE 0.05 % MT LIQD
7.0000 mL | Freq: Two times a day (BID) | OROMUCOSAL | Status: DC
  Administered 2014-06-06 – 2014-06-08 (×5): 7 mL via OROMUCOSAL

## 2014-06-05 MED ORDER — ENOXAPARIN SODIUM 40 MG/0.4ML ~~LOC~~ SOLN
40.0000 mg | SUBCUTANEOUS | Status: DC
  Administered 2014-06-06 – 2014-06-08 (×3): 40 mg via SUBCUTANEOUS
  Filled 2014-06-05 (×3): qty 0.4

## 2014-06-05 MED ORDER — ESTRADIOL 0.05 MG/24HR TD PTWK
0.0500 mg | MEDICATED_PATCH | TRANSDERMAL | Status: DC
Start: 2014-06-09 — End: 2014-06-07

## 2014-06-05 MED ORDER — IPRATROPIUM BROMIDE 0.02 % IN SOLN
0.5000 mg | Freq: Once | RESPIRATORY_TRACT | Status: AC
  Administered 2014-06-05: 0.5 mg via RESPIRATORY_TRACT
  Filled 2014-06-05: qty 2.5

## 2014-06-05 MED ORDER — DOCUSATE SODIUM 100 MG PO CAPS
100.0000 mg | ORAL_CAPSULE | Freq: Every day | ORAL | Status: DC | PRN
  Filled 2014-06-05: qty 1

## 2014-06-05 MED ORDER — IPRATROPIUM BROMIDE 0.02 % IN SOLN: 0.5000 mg | Freq: Four times a day (QID) | RESPIRATORY_TRACT | Status: DC

## 2014-06-05 MED ORDER — LINAGLIPTIN 5 MG PO TABS
5.0000 mg | ORAL_TABLET | Freq: Every day | ORAL | Status: DC
  Administered 2014-06-06 – 2014-06-08 (×3): 5 mg via ORAL
  Filled 2014-06-05 (×3): qty 1

## 2014-06-05 MED ORDER — TRAMADOL HCL 50 MG PO TABS
50.0000 mg | ORAL_TABLET | Freq: Every evening | ORAL | Status: DC | PRN
  Administered 2014-06-06 – 2014-06-07 (×2): 50 mg via ORAL
  Filled 2014-06-05 (×2): qty 1

## 2014-06-05 MED ORDER — ALBUTEROL (5 MG/ML) CONTINUOUS INHALATION SOLN
10.0000 mg/h | INHALATION_SOLUTION | Freq: Once | RESPIRATORY_TRACT | Status: AC
  Administered 2014-06-05: 10 mg/h via RESPIRATORY_TRACT
  Filled 2014-06-05: qty 20

## 2014-06-05 MED ORDER — ACETAMINOPHEN 650 MG RE SUPP: 650.0000 mg | Freq: Four times a day (QID) | RECTAL | Status: DC | PRN

## 2014-06-05 MED ORDER — METHYLPREDNISOLONE SODIUM SUCC 125 MG IJ SOLR
60.0000 mg | Freq: Two times a day (BID) | INTRAMUSCULAR | Status: DC
  Administered 2014-06-06: 60 mg via INTRAVENOUS
  Filled 2014-06-05 (×2): qty 0.96

## 2014-06-05 MED ORDER — MONTELUKAST SODIUM 10 MG PO TABS
10.0000 mg | ORAL_TABLET | Freq: Every day | ORAL | Status: DC
  Administered 2014-06-06 – 2014-06-07 (×2): 10 mg via ORAL
  Filled 2014-06-05 (×4): qty 1

## 2014-06-05 MED ORDER — MELOXICAM 15 MG PO TABS
15.0000 mg | ORAL_TABLET | Freq: Two times a day (BID) | ORAL | Status: DC
  Administered 2014-06-05 – 2014-06-08 (×6): 15 mg via ORAL
  Filled 2014-06-05 (×7): qty 1

## 2014-06-05 MED ORDER — ALBUTEROL SULFATE (2.5 MG/3ML) 0.083% IN NEBU: 2.5000 mg | INHALATION_SOLUTION | RESPIRATORY_TRACT | Status: DC | PRN

## 2014-06-05 NOTE — ED Notes (Signed)
She c/o increase in shortness of breath after MN today. She states she has an impending app't. With Dr. Melvyn Novas (Pulmonology).  She states she has dealt with waxing and waning shortness of breath with a few steroid dosepacks and antibiotics prescribed since July of this year.  She is mildly short of breath and in no distress.  Monitor shows nsr without ectopy.

## 2014-06-05 NOTE — ED Notes (Signed)
Pt taken off O2 for a trial period.  As long as pt did not speak O2 sats stayed between 88-91% on RA however when pt speaks O2 drops to 80-83% RA.  2L via [place] applied and pt's O2 sat is currently at 92%.

## 2014-06-05 NOTE — ED Provider Notes (Signed)
CSN: [id number]     Arrival date & time 06/05/14  1707 History   First MD Initiated Contact with Patient 06/05/14 1727     Chief Complaint  Patient presents with  . Shortness of Breath   Patient is a 73 y.o. female presenting with shortness of breath. The history is provided by the patient.  Shortness of Breath Severity:  Moderate Onset quality:  Gradual (off an on since July) Timing:  Intermittent Progression:  Worsening (Today her symptoms became much worse.  She was trying to take a nap when she used her c pap, laid back and became very short of breath.  It woke her and She had to pull her mask off.  ) Context: not activity, not fumes and not smoke exposure   Relieved by:  Position changes and sitting up Associated symptoms: no abdominal pain, no chest pain, no cough, no fever, no rash, no sputum production and no wheezing   Risk factors: obesity    patient's symptoms initially started in July she saw her primary doctor a couple of times where she was treated with steroid dose packs and antibiotics. The patient would have a brief interval improvement in her symptoms but then returned after about 4 or 5 days.  She is scheduled to see a pulmonologist this coming week for COPD. Patient states she was never told she had COPD before.  She does have a history of smoking but quit many years ago.  Past Medical History  Diagnosis Date  . Hypertension   . Diabetes mellitus   . Arthritis   . Anemia   . Diverticulitis   . Shortness of breath     with exertion   . Sleep apnea     cpap at 10    Past Surgical History  Procedure Laterality Date  . Appendectomy    . Back surgery    . Colon resection  10/09/2011    Procedure: COLON RESECTION;  Surgeon: Gayland Curry, MD;  Location: [place];  Service: General;  Laterality: N/A;  Colon Resection with colostomy  . Colostomy    . Jackson pratt    . Application of wound vac  wound vac  . Abdominal hysterectomy  1979  . Proctoscopy  06/26/2012   Procedure: PROCTOSCOPY;  Surgeon: Gayland Curry, MD,FACS;  Location: [facility];  Service: General;  Laterality: N/A;  Rigid Proctoscopy  . Colon resection  06/26/2012    Procedure: COLON RESECTION;  Surgeon: Gayland Curry, MD,FACS;  Location: [facility];  Service: General;  Laterality: N/A;  . Application of wound vac  06/26/2012    Procedure: APPLICATION OF WOUND VAC;  Surgeon: Gayland Curry, MD,FACS;  Location: [facility];  Service: General;;   Family History  Problem Relation Age of Onset  . Heart disease Father   . Diabetes Father   . Hypertension Father   . Heart disease Brother   . Diabetes Brother   . Diabetes Mother   . Hypertension Mother   . Diabetes Sister    History  Substance Use Topics  . Smoking status: Former Smoker    Quit date: 09/25/2011  . Smokeless tobacco: Never Used  . Alcohol Use: No   OB History   Grav Para Term Preterm Abortions TAB SAB Ect Mult Living                 Review of Systems  Constitutional: Negative for fever.  Respiratory: Positive for shortness of breath. Negative for cough, sputum  production and wheezing.   Cardiovascular: Negative for chest pain.  Gastrointestinal: Negative for abdominal pain.  Skin: Negative for rash.  All other systems reviewed and are negative.     Allergies  Adhesive; Vancomycin; and Zosyn  Home Medications   Prior to Admission medications   Medication Sig Start Date End Date Taking? Authorizing Provider  albuterol (PROVENTIL HFA;VENTOLIN HFA) 108 (90 BASE) MCG/ACT inhaler Inhale 2 puffs into the lungs every 4 (four) hours as needed for wheezing. 03/18/13  Yes Bobby Rumpf York, PA-C  aspirin 81 MG tablet Take 162 mg by mouth daily.    Yes Historical Provider, MD  CALCIUM-VITAMIN D PO Take 1 tablet by mouth 2 (two) times daily. 600 d2   Yes Historical Provider, MD  cholecalciferol (VITAMIN D) 1000 UNITS tablet Take 1,000 Units by mouth daily.   Yes Historical Provider, MD  Chromium-Cinnamon (CINNAMON PLUS CHROMIUM PO)  Take 3 capsules by mouth daily. 600mg    Yes Historical Provider, MD  furosemide (LASIX) 20 MG tablet Take 20 mg by mouth as needed for fluid or edema.  01/09/12  Yes Historical Provider, MD  losartan (COZAAR) 100 MG tablet Take 100 mg by mouth every morning.    Yes Historical Provider, MD  meloxicam (MOBIC) 15 MG tablet Take 15 mg by mouth 2 (two) times daily.    Yes Historical Provider, MD  metFORMIN (GLUCOPHAGE) 500 MG tablet Take 1,000 mg by mouth 2 (two) times daily with a meal.    Yes Historical Provider, MD  Misc Natural Products (GLUCOSAMINE-CHONDROITIN PLUS PO) Take 2 tablets by mouth daily. Patient takes one in am and one in pm   Yes Historical Provider, MD  montelukast (SINGULAIR) 10 MG tablet Take 10 mg by mouth daily.    Yes Historical Provider, MD  sitaGLIPtin (JANUVIA) 50 MG tablet Take 50 mg by mouth every morning.    Yes Historical Provider, MD  traMADol (ULTRAM) 50 MG tablet Take 50 mg by mouth at bedtime as needed for moderate pain.  01/09/12  Yes Historical Provider, MD  VIVELLE-DOT 0.05 MG/24HR patch Place 1 patch onto the skin 2 (two) times a week.  07/17/13  Yes Historical Provider, MD   BP 127/58  Pulse 72  Temp(Src) 97.4 F (36.3 C) (Oral)  Resp 10  SpO2 92% Physical Exam  Nursing note and vitals reviewed. Constitutional: She appears well-developed and well-nourished. No distress.  Morbidly obese  HENT:  Head: Normocephalic and atraumatic.  Right Ear: External ear normal.  Left Ear: External ear normal.  Eyes: Conjunctivae are normal. Right eye exhibits no discharge. Left eye exhibits no discharge. No scleral icterus.  Neck: Neck supple. No tracheal deviation present.  Cardiovascular: Normal rate, regular rhythm and intact distal pulses.   Pulmonary/Chest: Effort normal and breath sounds normal. No stridor. No respiratory distress. She has no wheezes. She has no rales.  Occasional pursed lip breathing  Abdominal: Soft. Bowel sounds are normal. She exhibits no  distension. There is no tenderness. There is no rebound and no guarding.  Small chronic healing wound lower abdomen distal aspect of a well-healed surgical scar  Musculoskeletal: She exhibits no edema and no tenderness.  Neurological: She is alert. She has normal strength. No cranial nerve deficit (no facial droop, extraocular movements intact, no slurred speech) or sensory deficit. She exhibits normal muscle tone. She displays no seizure activity. Coordination normal.  Skin: Skin is warm and dry. No rash noted.  Psychiatric: She has a normal mood and affect.  ED Course  Procedures (including critical care time) Labs Review Labs Reviewed  BASIC METABOLIC PANEL - Abnormal; Notable for the following:    Glucose, Bld 136 (*)    GFR calc non Af Amer 63 (*)    GFR calc Af Amer 73 (*)    All other components within normal limits  CBC - Abnormal; Notable for the following:    WBC 12.6 (*)    RBC 5.32 (*)    RDW 21.7 (*)    All other components within normal limits  D-DIMER, QUANTITATIVE - Abnormal; Notable for the following:    D-Dimer, Quant 0.72 (*)    All other components within normal limits  PRO B NATRIURETIC PEPTIDE  I-STAT TROPOININ, ED    Imaging Review Dg Chest 2 View  06/05/2014   CLINICAL DATA:  Shortness of breath and cough.  EXAM: CHEST  2 VIEW  COMPARISON:  03/16/2013  FINDINGS: The heart is enlarged but stable. There is tortuosity calcification of the thoracic aorta. There are chronic bronchitic type interstitial lung changes but no acute overlying pulmonary process. No pleural effusion. The bony thorax is intact.  IMPRESSION: Stable cardiac enlargement and chronic bronchitic changes but no definite acute pulmonary findings.   Electronically Signed   By: Kalman Jewels M.D.   On: 06/05/2014 18:58     EKG Interpretation   Date/Time:  Saturday June 05 2014 17:14:42 EDT Ventricular Rate:  79 PR Interval:  210 QRS Duration: 98 QT Interval:  408 QTC Calculation:  468 R Axis:   108 Text Interpretation:  Sinus rhythm Atrial premature complex Anteroseptal  infarct, age indeterminate nonspecific t wave changes since last tracing  Confirmed by Kylieann Eagles  MD-J, Kenyon Eshleman ([id number]) on 06/05/2014 5:28:33 PM     Medications  methylPREDNISolone sodium succinate (SOLU-MEDROL) 125 mg/2 mL injection 125 mg (not administered)  albuterol (PROVENTIL,VENTOLIN) solution continuous neb (10 mg/hr Nebulization Given 06/05/14 1940)  ipratropium (ATROVENT) nebulizer solution 0.5 mg (0.5 mg Nebulization Given 06/05/14 1940)    MDM   Final diagnoses:  COPD exacerbation  Hypoxia    On repeat exam pt did have some wheezing.  Given an hour long neb and steroids.  Pt continues with pursed lip breathing.  When oxygen is taken off her saturation drops in the 80s.  Pt is not on home o2 although she had been in the past.  Age adjusted d dimer level is wnl.  No pna on CXR.   Doubt CHF.  Will consult with hospitalist regarding admission for copd exacerbation.    Dorie Rank, MD 06/05/14 2120

## 2014-06-05 NOTE — H&P (Signed)
History and Physical  MEKIYAH GLADWELL NWG:[id number] DOB: [birth year] DOA: 06/05/2014  Referring physician: Dr Tomi Bamberger, ED physician PCP: [name], MD   Chief Complaint: Shortness of breath  HPI: [name] is a 73 y.o. female  With a history of diabetes, COPD, obstructive sleep apnea, obesity class III who presents to the hospital with shortness of breath that started approximately 6 weeks ago. She has been on steroids and Levaquin twice during that time period, which relieved her shortness of breath for approximately a week each time.  Her shortness of breath has become worse over the past several days. She does admit to a cough with white sputum production which is more than normal. She denies peripheral edema, leg pain, chest pain.    ER course: In the ER, she was treated with Solu-Medrol and nebulizer treatments. She becomes hypoxic when the oxygen is removed.   Review of Systems:   Pt denies any fevers, chills, nausea, vomiting, chest pain, leg pain, swelling, peripheral edema.  Review of systems are otherwise negative  Past Medical History  Diagnosis Date  . Hypertension   . Diabetes mellitus   . Arthritis   . Anemia   . Diverticulitis   . Shortness of breath     with exertion   . Sleep apnea     cpap at 10    Past Surgical History  Procedure Laterality Date  . Appendectomy    . Back surgery    . Colon resection  10/09/2011    Procedure: COLON RESECTION;  Surgeon: Gayland Curry, MD;  Location: [place];  Service: General;  Laterality: N/A;  Colon Resection with colostomy  . Colostomy    . Jackson pratt    . Application of wound vac  wound vac  . Abdominal hysterectomy  1979  . Proctoscopy  06/26/2012    Procedure: PROCTOSCOPY;  Surgeon: Gayland Curry, MD,FACS;  Location: [facility];  Service: General;  Laterality: N/A;  Rigid Proctoscopy  . Colon resection  06/26/2012    Procedure: COLON RESECTION;  Surgeon: Gayland Curry, MD,FACS;  Location: [facility];  Service: General;   Laterality: N/A;  . Application of wound vac  06/26/2012    Procedure: APPLICATION OF WOUND VAC;  Surgeon: Gayland Curry, MD,FACS;  Location: [facility];  Service: General;;   Social History:  reports that she quit smoking about 2 years ago. She has never used smokeless tobacco. She reports that she does not drink alcohol or use illicit drugs. Patient lives at home & is able to participate in activities of daily living   Allergies  Allergen Reactions  . Adhesive [Tape] Hives and Rash  . Vancomycin Rash  . Zosyn [Piperacillin Sod-Tazobactam So] Rash    Family History  Problem Relation Age of Onset  . Heart disease Father   . Diabetes Father   . Hypertension Father   . Heart disease Brother   . Diabetes Brother   . Diabetes Mother   . Hypertension Mother   . Diabetes Sister       Prior to Admission medications   Medication Sig Start Date End Date Taking? Authorizing Provider  albuterol (PROVENTIL HFA;VENTOLIN HFA) 108 (90 BASE) MCG/ACT inhaler Inhale 2 puffs into the lungs every 4 (four) hours as needed for wheezing. 03/18/13  Yes Bobby Rumpf York, PA-C  aspirin 81 MG tablet Take 162 mg by mouth daily.    Yes Historical Provider, MD  CALCIUM-VITAMIN D PO Take 1 tablet by mouth  2 (two) times daily. 600 d2   Yes Historical Provider, MD  cholecalciferol (VITAMIN D) 1000 UNITS tablet Take 1,000 Units by mouth daily.   Yes Historical Provider, MD  Chromium-Cinnamon (CINNAMON PLUS CHROMIUM PO) Take 3 capsules by mouth daily. 600mg    Yes Historical Provider, MD  furosemide (LASIX) 20 MG tablet Take 20 mg by mouth as needed for fluid or edema.  01/09/12  Yes Historical Provider, MD  losartan (COZAAR) 100 MG tablet Take 100 mg by mouth every morning.    Yes Historical Provider, MD  meloxicam (MOBIC) 15 MG tablet Take 15 mg by mouth 2 (two) times daily.    Yes Historical Provider, MD  metFORMIN (GLUCOPHAGE) 500 MG tablet Take 1,000 mg by mouth 2 (two) times daily with a meal.    Yes Historical  Provider, MD  Misc Natural Products (GLUCOSAMINE-CHONDROITIN PLUS PO) Take 2 tablets by mouth daily. Patient takes one in am and one in pm   Yes Historical Provider, MD  montelukast (SINGULAIR) 10 MG tablet Take 10 mg by mouth daily.    Yes Historical Provider, MD  sitaGLIPtin (JANUVIA) 50 MG tablet Take 50 mg by mouth every morning.    Yes Historical Provider, MD  traMADol (ULTRAM) 50 MG tablet Take 50 mg by mouth at bedtime as needed for moderate pain.  01/09/12  Yes Historical Provider, MD  VIVELLE-DOT 0.05 MG/24HR patch Place 1 patch onto the skin 2 (two) times a week.  07/17/13  Yes Historical Provider, MD    Physical Exam: BP 136/52  Pulse 94  Temp(Src) 97.4 F (36.3 C) (Oral)  Resp 21  SpO2 93%  General: Elderly Caucasian female. Awake and alert and oriented x3. No acute cardiopulmonary distress. She is able to be full sentences without shortness of breath. Eyes: Pupils equal, round, reactive to light. Extraocular muscles are intact. Sclerae anicteric and noninjected.  ENT: External auditory canals are patent and tympanic membranes reflect a good cone of light. Moist mucosal membranes. No mucosal lesions.  Neck: Neck supple without lymphadenopathy. No carotid bruits. No masses palpated.  Cardiovascular: Regular rate with normal S1-S2 sounds. No murmurs, rubs, gallops auscultated. No JVD.  Respiratory: Lung exhalation phase with markedly decreased breath sounds throughout. There is moderate wheezing throughout. No rales auscultated.  Abdomen: Soft, nontender, nondistended. Hyperactive bowel sounds. No masses or hepatosplenomegaly  Skin: Dry, warm to touch. 2+ dorsalis pedis and radial pulses. Musculoskeletal: No calf or leg pain. All major joints not erythematous nontender.  Psychiatric: Intact judgment and insight.  Neurologic: No focal neurological deficits. Cranial nerves II through XII are grossly intact.           Labs on Admission:  Basic Metabolic Panel:  Recent  Labs Lab 06/05/14 1822  NA 141  K 4.4  CL 98  CO2 28  GLUCOSE 136*  BUN 19  CREATININE 0.88  CALCIUM 9.9   Liver Function Tests: No results found for this basename: AST, ALT, ALKPHOS, BILITOT, PROT, ALBUMIN,  in the last 168 hours No results found for this basename: LIPASE, AMYLASE,  in the last 168 hours No results found for this basename: AMMONIA,  in the last 168 hours CBC:  Recent Labs Lab 06/05/14 1822  WBC 12.6*  HGB 14.1  HCT 45.3  MCV 85.2  PLT 276   Cardiac Enzymes: No results found for this basename: CKTOTAL, CKMB, CKMBINDEX, TROPONINI,  in the last 168 hours  BNP (last 3 results)  Recent Labs  06/05/14 1823  PROBNP 64.6  CBG: No results found for this basename: GLUCAP,  in the last 168 hours  Radiological Exams on Admission: Dg Chest 2 View  06/05/2014   CLINICAL DATA:  Shortness of breath and cough.  EXAM: CHEST  2 VIEW  COMPARISON:  03/16/2013  FINDINGS: The heart is enlarged but stable. There is tortuosity calcification of the thoracic aorta. There are chronic bronchitic type interstitial lung changes but no acute overlying pulmonary process. No pleural effusion. The bony thorax is intact.  IMPRESSION: Stable cardiac enlargement and chronic bronchitic changes but no definite acute pulmonary findings.   Electronically Signed   By: Kalman Jewels M.D.   On: 06/05/2014 18:58    EKG: Independently reviewed. No ST changes  Assessment/Plan Present on Admission:  . COPD exacerbation . Hypoxia  #1 hypoxia #2 COPD exacerbation We'll admit the patient to [facility]. Continue on oxygen therapy with nebulizer treatments and IV steroids. I don't feel that antibiotics are warranted, given that the patient is not having sputum production and x-ray showing no acute infection. Likely, the patient will need to be placed on inhaled controller medication in order to prevent further exacerbations.  #3 obstructive sleep apnea Consult respiratory therapy for CPAP  #4  diabetes Continue metformin and Januvia  #5 hypertension controlled Continue home medications   DVT prophylaxis: Lovenox  Consultants: None  Code Status: Full  Family Communication: None   Disposition Plan: Home following stabilization  Time spent: 60 minutes  Loma Boston, DO [facility] Pager [phone]  **Disclaimer: This note may have been dictated with voice recognition software. Similar sounding words can inadvertently be transcribed and this note may contain transcription errors which may not have been corrected upon publication of note.**

## 2014-06-05 NOTE — ED Notes (Signed)
Bed: RESB Expected date:  Expected time:  Means of arrival:  Comments: SHOB  

## 2014-06-06 ENCOUNTER — Encounter (HOSPITAL_COMMUNITY): Payer: Self-pay | Admitting: *Deleted

## 2014-06-06 DIAGNOSIS — I10 Essential (primary) hypertension: Secondary | ICD-10-CM

## 2014-06-06 DIAGNOSIS — R0902 Hypoxemia: Secondary | ICD-10-CM

## 2014-06-06 DIAGNOSIS — G4733 Obstructive sleep apnea (adult) (pediatric): Secondary | ICD-10-CM

## 2014-06-06 DIAGNOSIS — J441 Chronic obstructive pulmonary disease with (acute) exacerbation: Principal | ICD-10-CM

## 2014-06-06 LAB — BASIC METABOLIC PANEL
Anion gap: 17 — ABNORMAL HIGH (ref 5–15)
BUN: 22 mg/dL (ref 6–23)
CALCIUM: 9.4 mg/dL (ref 8.4–10.5)
CO2: 26 mEq/L (ref 19–32)
CREATININE: 0.94 mg/dL (ref 0.50–1.10)
Chloride: 97 mEq/L (ref 96–112)
GFR calc non Af Amer: 58 mL/min — ABNORMAL LOW (ref >90)
GFR, EST AFRICAN AMERICAN: 68 mL/min — AB (ref >90)
Glucose, Bld: 288 mg/dL — ABNORMAL HIGH (ref 70–99)
Potassium: 4.8 mEq/L (ref 3.7–5.3)
Sodium: 140 mEq/L (ref 137–147)

## 2014-06-06 LAB — GLUCOSE, CAPILLARY
GLUCOSE-CAPILLARY: 184 mg/dL — AB (ref 70–99)
GLUCOSE-CAPILLARY: 275 mg/dL — AB (ref 70–99)
Glucose-Capillary: 258 mg/dL — ABNORMAL HIGH (ref 70–99)
Glucose-Capillary: 212 mg/dL — ABNORMAL HIGH (ref 70–99)
Glucose-Capillary: 243 mg/dL — ABNORMAL HIGH (ref 70–99)

## 2014-06-06 LAB — CBC
HEMATOCRIT: 42.4 % (ref 36.0–46.0)
Hemoglobin: 13.1 g/dL (ref 12.0–15.0)
MCH: 26.4 pg (ref 26.0–34.0)
MCHC: 30.9 g/dL (ref 30.0–36.0)
MCV: 85.3 fL (ref 78.0–100.0)
Platelets: 279 10*3/uL (ref 150–400)
RBC: 4.97 MIL/uL (ref 3.87–5.11)
RDW: 21.8 % — AB (ref 11.5–15.5)
WBC: 13.3 10*3/uL — ABNORMAL HIGH (ref 4.0–10.5)

## 2014-06-06 LAB — MRSA PCR SCREENING: MRSA BY PCR: NEGATIVE

## 2014-06-06 MED ORDER — FLUTICASONE PROPIONATE HFA 110 MCG/ACT IN AERO
2.0000 | INHALATION_SPRAY | Freq: Two times a day (BID) | RESPIRATORY_TRACT | Status: DC
  Administered 2014-06-06 – 2014-06-08 (×5): 2 via RESPIRATORY_TRACT
  Filled 2014-06-06: qty 12

## 2014-06-06 MED ORDER — METHYLPREDNISOLONE SODIUM SUCC 125 MG IJ SOLR
60.0000 mg | Freq: Four times a day (QID) | INTRAMUSCULAR | Status: DC
  Administered 2014-06-06 – 2014-06-07 (×4): 60 mg via INTRAVENOUS
  Filled 2014-06-06 (×7): qty 0.96

## 2014-06-06 MED ORDER — IPRATROPIUM-ALBUTEROL 0.5-2.5 (3) MG/3ML IN SOLN
3.0000 mL | Freq: Four times a day (QID) | RESPIRATORY_TRACT | Status: DC
  Administered 2014-06-06 – 2014-06-08 (×10): 3 mL via RESPIRATORY_TRACT
  Filled 2014-06-06 (×10): qty 3

## 2014-06-06 MED ORDER — INSULIN ASPART 100 UNIT/ML ~~LOC~~ SOLN
0.0000 [IU] | Freq: Every day | SUBCUTANEOUS | Status: DC
  Administered 2014-06-06: 3 [IU] via SUBCUTANEOUS
  Administered 2014-06-07: 2 [IU] via SUBCUTANEOUS

## 2014-06-06 MED ORDER — METFORMIN HCL 500 MG PO TABS
1000.0000 mg | ORAL_TABLET | Freq: Once | ORAL | Status: AC
  Administered 2014-06-06: 1000 mg via ORAL
  Filled 2014-06-06: qty 2

## 2014-06-06 MED ORDER — LEVOFLOXACIN 500 MG PO TABS
500.0000 mg | ORAL_TABLET | Freq: Every day | ORAL | Status: DC
  Administered 2014-06-06 – 2014-06-08 (×3): 500 mg via ORAL
  Filled 2014-06-06 (×3): qty 1

## 2014-06-06 MED ORDER — ALPRAZOLAM 0.5 MG PO TABS
0.5000 mg | ORAL_TABLET | Freq: Once | ORAL | Status: AC
  Administered 2014-06-06: 0.5 mg via ORAL
  Filled 2014-06-06: qty 1

## 2014-06-06 MED ORDER — INSULIN ASPART 100 UNIT/ML ~~LOC~~ SOLN
0.0000 [IU] | Freq: Three times a day (TID) | SUBCUTANEOUS | Status: DC
  Administered 2014-06-06 (×2): 7 [IU] via SUBCUTANEOUS
  Administered 2014-06-07: 15 [IU] via SUBCUTANEOUS
  Administered 2014-06-07: 11 [IU] via SUBCUTANEOUS
  Administered 2014-06-07 – 2014-06-08 (×2): 7 [IU] via SUBCUTANEOUS
  Administered 2014-06-08: 4 [IU] via SUBCUTANEOUS

## 2014-06-06 NOTE — Progress Notes (Signed)
[facility] PROGRESS NOTE  [name] AST:[id number] DOB: November 18, 1940 DOA: 06/05/2014  PCP: [name], MD  Brief HPI: 73yo with PMH as below presented with shortness of breath, She was noted to have COPD exacerbation. She is supposed to see Dr. Melvyn Novas in near future.  Past medical history:  Past Medical History  Diagnosis Date  . Hypertension   . Diabetes mellitus   . Arthritis   . Anemia   . Diverticulitis   . Shortness of breath     with exertion   . Sleep apnea     cpap at 10     Consultants: None  Procedures: None  Antibiotics: Levaquin 9/13-->  Subjective: Patient feels well. Still short of breath. Has a cough. Denies chest pain.  Objective: Vital Signs  Filed Vitals:   06/05/14 2323 06/05/14 2325 06/06/14 0613 06/06/14 0907  BP: 95/48  104/55   Pulse: 81  83   Temp: 97.6 F (36.4 C)  97.7 F (36.5 C)   TempSrc: Oral  Oral   Resp:   16   Height:  5\' 5"  (1.651 m)    Weight:  146.512 kg (323 lb)    SpO2: 94%  93% 90%   No intake or output data in the 24 hours ending 06/06/14 1035 Filed Weights   06/05/14 2325  Weight: 146.512 kg (323 lb)    General appearance: alert, cooperative, appears stated age, no distress and morbidly obese Resp: coarse breath sounds without wheezing. No crackles. Cardio: regular rate and rhythm, S1, S2 normal, no murmur, click, rub or gallop GI: obese, NT, Chronic wound in midline. No erythema. BS present. Extremities: extremities normal, atraumatic, no cyanosis or edema Neurologic: No focal deficits.  Lab Results:  Basic Metabolic Panel:  Recent Labs Lab 06/05/14 1822 06/06/14 0530  NA 141 140  K 4.4 4.8  CL 98 97  CO2 28 26  GLUCOSE 136* 288*  BUN 19 22  CREATININE 0.88 0.94  CALCIUM 9.9 9.4   CBC:  Recent Labs Lab 06/05/14 1822 06/06/14 0530  WBC 12.6* 13.3*  HGB 14.1 13.1  HCT 45.3 42.4  MCV 85.2 85.3  PLT 276 279   BNP (last 3 results)  Recent Labs  06/05/14 1823  PROBNP  64.6   CBG:  Recent Labs Lab 06/05/14 2353 06/06/14 0737  GLUCAP 184* 258*    Recent Results (from the past 240 hour(s))  MRSA PCR SCREENING     Status: None   Collection Time    06/06/14 12:12 AM      Result Value Ref Range Status   MRSA by PCR NEGATIVE  NEGATIVE Final   Comment:            The GeneXpert MRSA Assay (FDA     approved for NASAL specimens     only), is one component of a     comprehensive MRSA colonization     surveillance program. It is not     intended to diagnose MRSA     infection nor to guide or     monitor treatment for     MRSA infections.      Studies/Results: Dg Chest 2 View  06/05/2014   CLINICAL DATA:  Shortness of breath and cough.  EXAM: CHEST  2 VIEW  COMPARISON:  03/16/2013  FINDINGS: The heart is enlarged but stable. There is tortuosity calcification of the thoracic aorta. There are chronic bronchitic type interstitial lung changes but no acute overlying pulmonary process. No  pleural effusion. The bony thorax is intact.  IMPRESSION: Stable cardiac enlargement and chronic bronchitic changes but no definite acute pulmonary findings.   Electronically Signed   By: Kalman Jewels M.D.   On: 06/05/2014 18:58    Medications:  Scheduled: . antiseptic oral rinse  7 mL Mouth Rinse BID  . aspirin  162 mg Oral Daily  . enoxaparin (LOVENOX) injection  40 mg Subcutaneous Q24H  . [START ON 06/09/2014] estradiol  0.05 mg Transdermal Weekly  . fluticasone  2 puff Inhalation BID  . ipratropium-albuterol  3 mL Nebulization Q6H  . levofloxacin  500 mg Oral Daily  . linagliptin  5 mg Oral Daily  . losartan  100 mg Oral q morning - 10a  . meloxicam  15 mg Oral BID  . metFORMIN  1,000 mg Oral BID WC  . methylPREDNISolone (SOLU-MEDROL) injection  60 mg Intravenous Q6H  . montelukast  10 mg Oral QHS   Continuous:  QIW:[id number], acetaminophen, albuterol, alum & mag hydroxide-simeth, docusate sodium, furosemide, guaiFENesin-dextromethorphan, ondansetron  (ZOFRAN) IV, ondansetron, traMADol  Assessment/Plan:  Active Problems:   COPD exacerbation   Hypoxia    Acute COPD exacerbation with Hypoxia Sats dropped to low 80's in ED. She is receiving steroids, nebs. Will add antibiotics. Continue on oxygen therapy. Add inhaled steroids. Might need home oxygen. Will recheck once she is improved.  History of Obstructive Sleep Apnea  Continue CPAP  Diabetes Mellitus Type 2 Continue metformin and Januvia. SSI.  Essential Hypertension Continue home medications.   Morbid Obesity Needs OP weight loss counseling  Chronic Abdominal Wound Stable. Recently seen at surgery clinic. No active infection.   DVT Prophylaxis: Enoxaparin    Code Status: Full Code  Family Communication: Discussed with patient.  Disposition Plan: Not ready for discharge    LOS: 1 day   [facility] Pager [phone] 06/06/2014, 10:35 AM  If 8PM-8AM, please contact night-coverage at [email or web address], password [email or web address]   Disclaimer: This note was dictated with voice recognition software. Similar sounding words can inadvertently be transcribed and may not be corrected upon review.

## 2014-06-06 NOTE — Progress Notes (Signed)
Pt needs carb mod diet. Will alert MD.

## 2014-06-06 NOTE — Progress Notes (Signed)
Pt stated that she would self administer CPAP when ready for bed.  Current settings are Auto CPAP 12-20 CMH20 with 2 LPM O2 bleed in via Nasal mask.  Pt to notify RN to call RT if any issues arise.  RT to monitor and assess as needed.

## 2014-06-06 NOTE — Progress Notes (Signed)
Pt placed on Auto CPAP 12-20 CMh20 with 2 LPM o2 bleed in via nasal mask.  Pt tolerating well at this time, RT to monitor and assess as needed.

## 2014-06-06 NOTE — Progress Notes (Signed)
RT setup Auto CPAP 8-20 CMH20 with 2 LPM O2 bleed in via Nasal mask.  Pt eating at this time, will notify RN to call RT when ready.  RT to monitor and assess as needed.

## 2014-06-07 LAB — CBC
HEMATOCRIT: 41.6 % (ref 36.0–46.0)
HEMOGLOBIN: 12.8 g/dL (ref 12.0–15.0)
MCH: 25.8 pg — ABNORMAL LOW (ref 26.0–34.0)
MCHC: 30.8 g/dL (ref 30.0–36.0)
MCV: 83.9 fL (ref 78.0–100.0)
Platelets: 296 10*3/uL (ref 150–400)
RBC: 4.96 MIL/uL (ref 3.87–5.11)
RDW: 21.8 % — ABNORMAL HIGH (ref 11.5–15.5)
WBC: 18 10*3/uL — AB (ref 4.0–10.5)

## 2014-06-07 LAB — BASIC METABOLIC PANEL
Anion gap: 13 (ref 5–15)
BUN: 34 mg/dL — AB (ref 6–23)
CHLORIDE: 97 meq/L (ref 96–112)
CO2: 26 meq/L (ref 19–32)
CREATININE: 0.97 mg/dL (ref 0.50–1.10)
Calcium: 8.6 mg/dL (ref 8.4–10.5)
GFR calc Af Amer: 65 mL/min — ABNORMAL LOW (ref >90)
GFR calc non Af Amer: 56 mL/min — ABNORMAL LOW (ref >90)
GLUCOSE: 299 mg/dL — AB (ref 70–99)
Potassium: 5.1 mEq/L (ref 3.7–5.3)
Sodium: 136 mEq/L — ABNORMAL LOW (ref 137–147)

## 2014-06-07 LAB — GLUCOSE, CAPILLARY
GLUCOSE-CAPILLARY: 306 mg/dL — AB (ref 70–99)
GLUCOSE-CAPILLARY: 238 mg/dL — AB (ref 70–99)
Glucose-Capillary: 271 mg/dL — ABNORMAL HIGH (ref 70–99)

## 2014-06-07 LAB — HEMOGLOBIN A1C
Hgb A1c MFr Bld: 6.9 % — ABNORMAL HIGH (ref <5.7)
Mean Plasma Glucose: 151 mg/dL — ABNORMAL HIGH (ref <117)

## 2014-06-07 MED ORDER — INSULIN DETEMIR 100 UNIT/ML ~~LOC~~ SOLN
15.0000 [IU] | Freq: Every day | SUBCUTANEOUS | Status: DC
  Administered 2014-06-07 – 2014-06-08 (×2): 15 [IU] via SUBCUTANEOUS
  Filled 2014-06-07 (×2): qty 0.15

## 2014-06-07 MED ORDER — ESTRADIOL 0.05 MG/24HR TD PTWK: 0.0500 mg | MEDICATED_PATCH | TRANSDERMAL | Status: DC

## 2014-06-07 MED ORDER — PANTOPRAZOLE SODIUM 40 MG PO TBEC
40.0000 mg | DELAYED_RELEASE_TABLET | Freq: Every day | ORAL | Status: DC
  Administered 2014-06-07 – 2014-06-08 (×2): 40 mg via ORAL
  Filled 2014-06-07 (×2): qty 1

## 2014-06-07 MED ORDER — ESTRADIOL 0.05 MG/24HR TD PTWK
0.0500 mg | MEDICATED_PATCH | TRANSDERMAL | Status: DC
  Administered 2014-06-07: 0.05 mg via TRANSDERMAL
  Filled 2014-06-07: qty 1

## 2014-06-07 MED ORDER — DIPHENHYDRAMINE HCL 25 MG PO CAPS
25.0000 mg | ORAL_CAPSULE | Freq: Once | ORAL | Status: AC
  Administered 2014-06-07: 25 mg via ORAL
  Filled 2014-06-07: qty 1

## 2014-06-07 MED ORDER — ALUM & MAG HYDROXIDE-SIMETH 200-200-20 MG/5ML PO SUSP
30.0000 mL | Freq: Once | ORAL | Status: AC
  Administered 2014-06-07: 30 mL via ORAL

## 2014-06-07 MED ORDER — METHYLPREDNISOLONE SODIUM SUCC 125 MG IJ SOLR
60.0000 mg | Freq: Two times a day (BID) | INTRAMUSCULAR | Status: DC
  Administered 2014-06-07: 60 mg via INTRAVENOUS
  Administered 2014-06-08: 11:00:00 via INTRAVENOUS
  Filled 2014-06-07 (×3): qty 0.96

## 2014-06-07 NOTE — Progress Notes (Signed)
[facility] PROGRESS NOTE  [name] HQI:[id number] DOB: 18-May-1941 DOA: 06/05/2014  PCP: [name], MD  Brief HPI: 73yo with PMH as below presented with shortness of breath, She was noted to have COPD exacerbation. She is supposed to see Dr. Melvyn Novas in near future.  Past medical history:  Past Medical History  Diagnosis Date  . Hypertension   . Diabetes mellitus   . Arthritis   . Anemia   . Diverticulitis   . Shortness of breath     with exertion   . Sleep apnea     cpap at 10     Consultants: None  Procedures: None  Antibiotics: Levaquin 9/13-->  Subjective: Patient feels better wrt to breathing. Has some nausea this morning. Denies chest pain.   Objective: Vital Signs  Filed Vitals:   06/06/14 2043 06/06/14 2108 06/07/14 0520 06/07/14 0721  BP:  112/44 113/46   Pulse:  90 86   Temp:  97.6 F (36.4 C) 97.7 F (36.5 C)   TempSrc:  Oral Oral   Resp:  19 20   Height:      Weight:      SpO2: 92% 93% 94% 94%    Intake/Output Summary (Last 24 hours) at 06/07/14 1145 Last data filed at 06/07/14 1324  Gross per 24 hour  Intake    600 ml  Output      0 ml  Net    600 ml   Filed Weights   06/05/14 2325  Weight: 146.512 kg (323 lb)    General appearance: alert, cooperative, appears stated age, no distress and morbidly obese Resp: Improved air entry bilaterally. coarse breath sounds without wheezing. No crackles. Cardio: regular rate and rhythm, S1, S2 normal, no murmur, click, rub or gallop GI: mildly tender in epigastrium. obese, Chronic wound in midline. No erythema. BS present. Extremities: extremities normal, atraumatic, no cyanosis or edema Neurologic: No focal deficits.  Lab Results:  Basic Metabolic Panel:  Recent Labs Lab 06/05/14 1822 06/06/14 0530 06/07/14 0500  NA 141 140 136*  K 4.4 4.8 5.1  CL 98 97 97  CO2 28 26 26   GLUCOSE 136* 288* 299*  BUN 19 22 34*  CREATININE 0.88 0.94 0.97  CALCIUM 9.9 9.4 8.6    CBC:  Recent Labs Lab 06/05/14 1822 06/06/14 0530 06/07/14 0500  WBC 12.6* 13.3* 18.0*  HGB 14.1 13.1 12.8  HCT 45.3 42.4 41.6  MCV 85.2 85.3 83.9  PLT 276 279 296   BNP (last 3 results)  Recent Labs  06/05/14 1823  PROBNP 64.6   CBG:  Recent Labs Lab 06/06/14 1133 06/06/14 1631 06/06/14 2106 06/07/14 0741 06/07/14 1130  GLUCAP 212* 243* 275* 271* 306*    Recent Results (from the past 240 hour(s))  MRSA PCR SCREENING     Status: None   Collection Time    06/06/14 12:12 AM      Result Value Ref Range Status   MRSA by PCR NEGATIVE  NEGATIVE Final   Comment:            The GeneXpert MRSA Assay (FDA     approved for NASAL specimens     only), is one component of a     comprehensive MRSA colonization     surveillance program. It is not     intended to diagnose MRSA     infection nor to guide or     monitor treatment for     MRSA infections.  Studies/Results: Dg Chest 2 View  06/05/2014   CLINICAL DATA:  Shortness of breath and cough.  EXAM: CHEST  2 VIEW  COMPARISON:  03/16/2013  FINDINGS: The heart is enlarged but stable. There is tortuosity calcification of the thoracic aorta. There are chronic bronchitic type interstitial lung changes but no acute overlying pulmonary process. No pleural effusion. The bony thorax is intact.  IMPRESSION: Stable cardiac enlargement and chronic bronchitic changes but no definite acute pulmonary findings.   Electronically Signed   By: Kalman Jewels M.D.   On: 06/05/2014 18:58    Medications:  Scheduled: . alum & mag hydroxide-simeth  30 mL Oral Once  . antiseptic oral rinse  7 mL Mouth Rinse BID  . aspirin  162 mg Oral Daily  . enoxaparin (LOVENOX) injection  40 mg Subcutaneous Q24H  . [START ON 06/09/2014] estradiol  0.05 mg Transdermal Weekly  . fluticasone  2 puff Inhalation BID  . insulin aspart  0-20 Units Subcutaneous TID WC  . insulin aspart  0-5 Units Subcutaneous QHS  . insulin detemir  15 Units  Subcutaneous Daily  . ipratropium-albuterol  3 mL Nebulization Q6H  . levofloxacin  500 mg Oral Daily  . linagliptin  5 mg Oral Daily  . losartan  100 mg Oral q morning - 10a  . meloxicam  15 mg Oral BID  . metFORMIN  1,000 mg Oral BID WC  . methylPREDNISolone (SOLU-MEDROL) injection  60 mg Intravenous Q12H  . montelukast  10 mg Oral QHS  . pantoprazole  40 mg Oral Q1200   Continuous:  KDX:[id number], acetaminophen, albuterol, alum & mag hydroxide-simeth, docusate sodium, furosemide, guaiFENesin-dextromethorphan, ondansetron (ZOFRAN) IV, ondansetron, traMADol  Assessment/Plan:  Active Problems:   COPD exacerbation   Hypoxia    Acute COPD exacerbation/Acute respiratory failure with Hypoxia Sats dropped to low 80's in ED. She is receiving steroids, nebs, antibiotics. Continue on oxygen therapy. Added inhaled steroids. Sats dropped again with ambulation. She will need home oxygen. Taper steroids. PPI and maalox for gastric irritation.  History of Obstructive Sleep Apnea  Continue CPAP  Diabetes Mellitus Type 2 Continue metformin and Januvia. SSI. Add Levemir due to hyperglycemia from steroids. Should improve as steroid is tapered.  Essential Hypertension Continue home medications.   Morbid Obesity Needs OP weight loss counseling  Chronic Abdominal Wound Stable. Recently seen at surgery clinic. No active infection.  Repeat labs in Am for high normal K level. Not on K supplementation.  DVT Prophylaxis: Enoxaparin    Code Status: Full Code  Family Communication: Discussed with patient.  Disposition Plan: Not ready for discharge    LOS: 2 days   [facility] Pager [phone] 06/07/2014, 11:45 AM  If 8PM-8AM, please contact night-coverage at [email or web address], password [email or web address]   Disclaimer: This note was dictated with voice recognition software. Similar sounding words can inadvertently be transcribed and may not be corrected upon review.

## 2014-06-07 NOTE — Progress Notes (Signed)
[facility]  [facility] is providing the following services: Home 02.  Patient already has a Cpap with [place]  If patient discharges after hours, please call [phone].   Linward Headland 06/07/2014, 3:48 PM

## 2014-06-07 NOTE — Clinical Documentation Improvement (Signed)
Presents with COPD Exacerbation, Morbid Obesity with BMI of 53.9; Hypoxia documented.   Respiratory Rates ranging from 10 to 21  O2 Sats- 86% in room air up to 94% on CPAP with 2L FiO2  Was in room air when she presented, placed on 2L FiO2 via nasal cannula and then progressed to CPAP  Please provide a diagnosis associated with the above data and treatment provided if clinically significant.  Acute Respiratory Failure Acute on Chronic Respiratory Failure Chronic Respiratory Failure Other Condition  Thank You, Zoila Shutter ,RN Clinical Documentation Specialist:  [place] [employer]

## 2014-06-07 NOTE — Evaluation (Signed)
Occupational Therapy Evaluation Patient Details Name: [name] MRN: [id number] DOB: [birth year] Today's Date: 06/07/2014    History of Present Illness Pt is a 73 year old female with history of diabetes, COPD, obstructive sleep apnea, and obesitywho presents to the hospital with shortness of breath.  Pt also reports abdominal surgery with chronic wound for which spouse provides dressing changes.   Clinical Impression   Pt has A at home as needed.  Pt has all DME and AE. Pt able to verbalize energy conservation strategies related to ADL activity    Follow Up Recommendations  No OT follow up    Equipment Recommendations  None recommended by OT       Precautions / Restrictions Precautions Precautions: None Precaution Comments: oxygen      Mobility Bed Mobility               General bed mobility comments: pt up in recliner on arrival  Transfers Overall transfer level: Needs assistance Equipment used: Rolling walker (2 wheeled) Transfers: Sit to/from Stand Sit to Stand: Supervision                   ADL Overall ADL's : Needs assistance/impaired     Grooming: Set up;Sitting   Upper Body Bathing: Supervision/ safety;Sitting   Lower Body Bathing: Sit to/from stand;Minimal assistance   Upper Body Dressing : Sitting;Set up   Lower Body Dressing: Minimal assistance       Toileting- Clothing Manipulation and Hygiene: Minimal assistance;Sit to/from Nurse, children's Details (indicate cue type and reason): pt has a tub bench and helper is with her all the time Functional mobility during ADLs: Supervision/safety General ADL Comments: Pt has a helper 8 hours a day who helps with bathing, dressing, and other ADL activity.  Husband helps with pt activity when helper is not there.  Pt has all needed DME at home as well AE.               Pertinent Vitals/Pain Pain Assessment: No/denies pain     Hand Dominance     Extremity/Trunk  Assessment Upper Extremity Assessment Upper Extremity Assessment: Overall WFL for tasks assessed   Lower Extremity Assessment Lower Extremity Assessment: Overall WFL for tasks assessed       Communication Communication Communication: No difficulties   Cognition Arousal/Alertness: Awake/alert Behavior During Therapy: WFL for tasks assessed/performed Overall Cognitive Status: Within Functional Limits for tasks assessed                           Shoulder Instructions      Home Living Family/patient expects to be discharged to:: Private residence Living Arrangements: Spouse/significant other Available Help at Discharge: Family;Personal care attendant Type of Home: House       Home Layout: One level     Bathroom Shower/Tub: Teacher, early years/pre: Handicapped height     Home Equipment: Environmental consultant - 2 wheels;Wheelchair - manual;Tub bench          Prior Functioning/Environment Level of Independence: Independent with assistive device(s)        Comments: pt reports household ambulation distances only due to SOB and chronic knee pain             OT Goals(Current goals can be found in the care plan section) Acute Rehab OT Goals Patient Stated Goal: get home  OT Goal Formulation: With patient  OT Frequency:  End of Session Nurse Communication: Mobility status  Activity Tolerance: Patient tolerated treatment well Patient left: in chair   Time: 4888-9169 OT Time Calculation (min): 16 min Charges:  OT General Charges $OT Visit: 1 Procedure OT Evaluation $Initial OT Evaluation Tier I: 1 Procedure OT Treatments $Self Care/Home Management : 8-22 mins G-Codes:    Payton Mccallum D 2014-06-09, 12:15 PM

## 2014-06-07 NOTE — Progress Notes (Signed)
RT placed patient on CPAP. CPAP is on 12 CMH2O. Sterile water added to water chamber for humidification. RT will monitor and assess as needed.

## 2014-06-07 NOTE — Evaluation (Signed)
Physical Therapy One Time Evaluation Patient Details Name: [name] MRN: [id number] DOB: 13-Jun-1941 Today's Date: 06/07/2014   History of Present Illness  Pt is a 73 year old female with history of diabetes, COPD, obstructive sleep apnea, and obesitywho presents to the hospital with shortness of breath.  Pt also reports abdominal surgery with chronic wound for which spouse provides dressing changes.  Clinical Impression  Patient evaluated by Physical Therapy with no further acute PT needs identified. All education has been completed and the patient has no further questions.  Pt reports being at baseline in regards to mobility.  Discussed f/u PT either HHPT or outpatient however pt reports she has tried both and did not feel they helped.  Discussed current oxygen requirements and pt hopeful to return to no oxygen needs. PT is signing off. Thank you for this referral.  SATURATION QUALIFICATIONS: (This note is used to comply with regulatory documentation for home oxygen)  Patient Saturations on Room Air at Rest = 97%  Patient Saturations on Room Air while Ambulating = 79%  Patient Saturations on 2 Liters of oxygen while Ambulating = 90-95%  Please briefly explain why patient needs home oxygen: to improve oxygen saturation with physical activity/mobility.      Follow Up Recommendations Home health PT;Outpatient PT    Equipment Recommendations  None recommended by PT    Recommendations for Other Services       Precautions / Restrictions Precautions Precautions: None Restrictions Weight Bearing Restrictions: No      Mobility  Bed Mobility               General bed mobility comments: pt up in recliner on arrival  Transfers Overall transfer level: Needs assistance Equipment used: Rolling walker (2 wheeled) Transfers: Sit to/from Stand Sit to Stand: Supervision            Ambulation/Gait Ambulation/Gait assistance: Supervision Ambulation Distance (Feet): 80  Feet Assistive device: Rolling walker (2 wheeled) Gait Pattern/deviations: Trunk flexed;Step-through pattern     General Gait Details: pt reports dyspnea on exertion, SpO2 down to 79% room air so reapplied 2L O2 [place], pt reports she has not ambulated this far in a few months and at her baseline in regards to mobility  Stairs            Wheelchair Mobility    Modified Rankin (Stroke Patients Only)       Balance                                             Pertinent Vitals/Pain Pain Assessment: No/denies pain    Home Living Family/patient expects to be discharged to:: Private residence Living Arrangements: Spouse/significant other Available Help at Discharge: Family;Personal care attendant Type of Home: House       Home Layout: One level Home Equipment: Environmental consultant - 2 wheels;Wheelchair - manual      Prior Function Level of Independence: Independent with assistive device(s)         Comments: pt reports household ambulation distances only due to SOB and chronic knee pain     Hand Dominance        Extremity/Trunk Assessment               Lower Extremity Assessment: Overall WFL for tasks assessed         Communication   Communication: No difficulties  Cognition  Arousal/Alertness: Awake/alert Behavior During Therapy: WFL for tasks assessed/performed Overall Cognitive Status: Within Functional Limits for tasks assessed                      General Comments      Exercises        Assessment/Plan    PT Assessment All further PT needs can be met in the next venue of care  PT Diagnosis Difficulty walking   PT Problem List Decreased activity tolerance;Decreased mobility;Cardiopulmonary status limiting activity;Obesity  PT Treatment Interventions     PT Goals (Current goals can be found in the Care Plan section) Acute Rehab PT Goals PT Goal Formulation: No goals set, d/c therapy    Frequency     Barriers to discharge         Co-evaluation               End of Session Equipment Utilized During Treatment: Oxygen Activity Tolerance: Other (comment) (limited by DOE) Patient left: in chair;with call bell/phone within reach Nurse Communication: Mobility status         Time: 7356-7014 PT Time Calculation (min): 16 min   Charges:   PT Evaluation $Initial PT Evaluation Tier I: 1 Procedure PT Treatments $Gait Training: 8-22 mins   PT G Codes:          Favor Kreh,KATHrine E 06/07/2014, 11:21 AM Carmelia Bake, PT, DPT 06/07/2014 Pager: [phone]

## 2014-06-07 NOTE — Progress Notes (Signed)
Inpatient Diabetes Program Recommendations  AACE/ADA: New Consensus Statement on Inpatient Glycemic Control (2013)  Target Ranges:  Prepandial:   less than 140 mg/dL      Peak postprandial:   less than 180 mg/dL (1-2 hours)      Critically ill patients:  140 - 180 mg/dL   Reason for Visit: Hyperglycemia  Diabetes history: DM2 Outpatient Diabetes medications: Januvia 50 mg QD, metformin 1000 mg bid Current orders for Inpatient glycemic control: Novolog resistant tidwc and hs, metformin 1000 mg bid, tradjenta 5 mg QD On Solumedrol 60 mg Q6H.  Hyperglycemia in the setting of steroids. ?glucose control at home.  Need HgbA1C to assess glycemic control PTA. Eating 100%.  Inpatient Diabetes Program Recommendations Insulin - Basal: If am FBS >180 mg/dL, will need to begin basal insulin - Levemir 15 units QHS Insulin - Meal Coverage: Add Novolog 6 units tidwc for meal coverage insulin HgbA1C: Check HgbA1c to assess glycemic control prior to hospitalization  Note: Will continue to follow. Thank you. Lorenda Peck, RD, LDN, CDE Inpatient Diabetes Coordinator [phone]

## 2014-06-08 ENCOUNTER — Other Ambulatory Visit (HOSPITAL_COMMUNITY): Payer: Medicare Other

## 2014-06-08 ENCOUNTER — Ambulatory Visit (HOSPITAL_COMMUNITY): Payer: Medicare Other

## 2014-06-08 LAB — GLUCOSE, CAPILLARY
GLUCOSE-CAPILLARY: 205 mg/dL — AB (ref 70–99)
GLUCOSE-CAPILLARY: 190 mg/dL — AB (ref 70–99)
Glucose-Capillary: 256 mg/dL — ABNORMAL HIGH (ref 70–99)

## 2014-06-08 LAB — BASIC METABOLIC PANEL
Anion gap: 9 (ref 5–15)
BUN: 39 mg/dL — ABNORMAL HIGH (ref 6–23)
CHLORIDE: 102 meq/L (ref 96–112)
CO2: 29 mEq/L (ref 19–32)
Calcium: 8.4 mg/dL (ref 8.4–10.5)
Creatinine, Ser: 0.9 mg/dL (ref 0.50–1.10)
GFR calc non Af Amer: 61 mL/min — ABNORMAL LOW (ref >90)
GFR, EST AFRICAN AMERICAN: 71 mL/min — AB (ref >90)
Glucose, Bld: 285 mg/dL — ABNORMAL HIGH (ref 70–99)
POTASSIUM: 5.8 meq/L — AB (ref 3.7–5.3)
Sodium: 140 mEq/L (ref 137–147)

## 2014-06-08 LAB — CBC
HCT: 41.9 % (ref 36.0–46.0)
Hemoglobin: 12.9 g/dL (ref 12.0–15.0)
MCH: 26.1 pg (ref 26.0–34.0)
MCHC: 30.8 g/dL (ref 30.0–36.0)
MCV: 84.8 fL (ref 78.0–100.0)
PLATELETS: 304 10*3/uL (ref 150–400)
RBC: 4.94 MIL/uL (ref 3.87–5.11)
RDW: 21.6 % — ABNORMAL HIGH (ref 11.5–15.5)
WBC: 18.4 10*3/uL — AB (ref 4.0–10.5)

## 2014-06-08 MED ORDER — GUAIFENESIN-DM 100-10 MG/5ML PO SYRP: 5.0000 mL | ORAL_SOLUTION | ORAL | Status: DC | PRN

## 2014-06-08 MED ORDER — LEVOFLOXACIN 500 MG PO TABS: 500.0000 mg | ORAL_TABLET | Freq: Every day | ORAL | Status: DC

## 2014-06-08 MED ORDER — IPRATROPIUM-ALBUTEROL 0.5-2.5 (3) MG/3ML IN SOLN: RESPIRATORY_TRACT | Status: DC

## 2014-06-08 MED ORDER — FLUTICASONE PROPIONATE HFA 110 MCG/ACT IN AERO: 2.0000 | INHALATION_SPRAY | Freq: Two times a day (BID) | RESPIRATORY_TRACT | Status: DC

## 2014-06-08 MED ORDER — SODIUM POLYSTYRENE SULFONATE 15 GM/60ML PO SUSP: 30.0000 g | Freq: Once | ORAL | Status: DC

## 2014-06-08 MED ORDER — SODIUM POLYSTYRENE SULFONATE 15 GM/60ML PO SUSP
30.0000 g | ORAL | Status: AC
  Administered 2014-06-08: 30 g via ORAL
  Filled 2014-06-08: qty 120

## 2014-06-08 MED ORDER — PANTOPRAZOLE SODIUM 40 MG PO TBEC: 40.0000 mg | DELAYED_RELEASE_TABLET | Freq: Every day | ORAL | Status: DC

## 2014-06-08 MED ORDER — PREDNISONE 20 MG PO TABS: ORAL_TABLET | ORAL | Status: DC

## 2014-06-08 NOTE — Progress Notes (Signed)
Inpatient Diabetes Program Recommendations  AACE/ADA: New Consensus Statement on Inpatient Glycemic Control (2013)  Target Ranges:  Prepandial:   less than 140 mg/dL      Peak postprandial:   less than 180 mg/dL (1-2 hours)      Critically ill patients:  140 - 180 mg/dL   Reason for Visit: Hyperglycemia  Results for [name], [name] (MRN [id number]) as of 06/08/2014 09:11  Ref. Range 06/07/2014 07:41 06/07/2014 11:30 06/07/2014 16:24 06/07/2014 21:42 06/08/2014 07:36  Glucose-Capillary Latest Range: 70-99 mg/dL 271 (H) 306 (H) 238 (H) 256 (H) 205 (H)  Results for [name], [name] (MRN [id number]) as of 06/08/2014 09:11  Ref. Range 06/07/2014 05:00  Hemoglobin A1C Latest Range: <5.7 % 6.9 (H)   Continues with hyperglycemia. HgbA1C reveals adequate glycemic control at home.  Recommendations: Increase Levemir to 20 units QD Add Novolog 6 units tidwc for meal coverage insulin  Will continue to follow. Thank you. Lorenda Peck, RD, LDN, CDE Inpatient Diabetes Coordinator [phone]

## 2014-06-08 NOTE — Care Management Note (Unsigned)
    Page 1 of 1   06/09/2014     11:21:53 AM CARE MANAGEMENT NOTE 06/09/2014  Patient:  [name], [name]   Account Number:  [id number]  Date Initiated:  06/08/2014  Documentation initiated by:  Allene Dillon  Subjective/Objective Assessment:   73 year old female admitted with COPD exacerbation.     Action/Plan:   From home.   Anticipated DC Date:  06/08/2014   Anticipated DC Plan:  [place]  CM consult      Choice offered to / List presented to:     DME arranged  [employer]  NEBULIZER MACHINE      DME agency  [place].        Status of service:  Completed, signed off Medicare Important Message given?  YES (If response is "NO", the following Medicare IM given date fields will be blank) Date Medicare IM given:  06/08/2014 Medicare IM given by:  [facility] Date Additional Medicare IM given:   Additional Medicare IM given by:    Discharge Disposition:    Per UR Regulation:    If discussed at Long Length of Stay Meetings, dates discussed:    Comments:  06/09/14 Allene Dillon RN BSN [phone] Pt called and stated she did not get a nebulizer machine delivered. I reviewed her orders and discovered a nebulizer machine wasn't ordered. I entered an order in [employer] and made [place] aware of the order. They will call her to schedule delivery.I have called pt and updated her.  06/08/14 Allene Dillon RN BSN [phone] [facility] made aware of need of oxygen hookup to her current CPAP machine.

## 2014-06-08 NOTE — Progress Notes (Signed)
Patient stable upon discharge. Patient educated and verbalized understanding of all discharge instructions.

## 2014-06-08 NOTE — Discharge Instructions (Signed)

## 2014-06-08 NOTE — Discharge Summary (Signed)
[facility]  Physician Discharge Summary   Patient ID: [name] MRN: [id number] DOB/AGE: 1941/06/02 73 y.o.  Admit date: 06/05/2014 Discharge date: 06/08/2014  PCP: [name], MD  DISCHARGE DIAGNOSES:  Active Problems:   COPD exacerbation   Hypoxia   RECOMMENDATIONS FOR OUTPATIENT FOLLOW UP: 1. Close follow up with PCP for BMET due to hyperkalemia 2. Home oxygen arranged 3. Cozaar held due to hyperkalemia  DISCHARGE CONDITION: fair  Diet recommendation: Mod Carb  Filed Weights   06/05/14 2325  Weight: 146.512 kg (323 lb)    INITIAL HISTORY: 72yo with PMH as below presented with shortness of breath, She was noted to have COPD exacerbation. She is supposed to see Dr. Melvyn Novas in near future.  Past medical history:  Past Medical History   Diagnosis  Date   .  Hypertension    .  Diabetes mellitus    .  Arthritis    .  Anemia    .  Diverticulitis    .  Shortness of breath      with exertion   .  Sleep apnea      cpap at 10     Consultations:  None  Procedures:  None  HOSPITAL COURSE:   Acute COPD exacerbation/Acute respiratory failure with Hypoxia  She was admitted and started on steroids, nebs, antibiotics. She improved slowly. She was saturating on low 80's in the ED. She was placed on oxygen therapy. Added inhaled steroids. Sats dropped again with ambulation. She will need home oxygen which has been arranged. Taper steroids at home. PPI for gastric irritation. This is better.  History of Obstructive Sleep Apnea  Continue CPAP   Diabetes Mellitus Type 2  Continue metformin and Januvia. SSI. Add Levemir in hospital due to hyperglycemia from steroids. Should improve as steroid is tapered. No need for insulin at home.  Essential Hypertension  Holding Cozaar due to hyperkalemia. Asked her to follow with her PCP soon.  Morbid Obesity  Needs OP weight loss counseling   Chronic Abdominal Wound  Stable. Recently seen at surgery clinic.  No active infection.   Hyperkalemia This was noted today. She is not on any potassium supplements. She does not have renal failure. The only medications that can cause this is the Cozaar. She was taken off of it. She was given kayexalate. She will need close follow up with her PCP for repeat K levels and consideration of alternative BP agents. She is asymptomatic otherwise.   Her Potassium should decrease with kayexalate and stopping of Cozaar. She will follow up closely with her PCP. She can be discharged today. I called her back and told her to have her blood work done Wednesday at her PCP's office.   PERTINENT LABS:  The results of significant diagnostics from this hospitalization (including imaging, microbiology, ancillary and laboratory) are listed below for reference.    Microbiology: Recent Results (from the past 240 hour(s))  MRSA PCR SCREENING     Status: None   Collection Time    06/06/14 12:12 AM      Result Value Ref Range Status   MRSA by PCR NEGATIVE  NEGATIVE Final   Comment:            The GeneXpert MRSA Assay (FDA     approved for NASAL specimens     only), is one component of a     comprehensive MRSA colonization     surveillance program. It is not     intended  to diagnose MRSA     infection nor to guide or     monitor treatment for     MRSA infections.     Labs: Basic Metabolic Panel:  Recent Labs Lab 06/05/14 1822 06/06/14 0530 06/07/14 0500 06/08/14 0449  NA 141 140 136* 140  K 4.4 4.8 5.1 5.8*  CL 98 97 97 102  CO2 28 26 26 29   GLUCOSE 136* 288* 299* 285*  BUN 19 22 34* 39*  CREATININE 0.88 0.94 0.97 0.90  CALCIUM 9.9 9.4 8.6 8.4   CBC:  Recent Labs Lab 06/05/14 1822 06/06/14 0530 06/07/14 0500 06/08/14 0449  WBC 12.6* 13.3* 18.0* 18.4*  HGB 14.1 13.1 12.8 12.9  HCT 45.3 42.4 41.6 41.9  MCV 85.2 85.3 83.9 84.8  PLT 276 279 296 304   BNP: BNP (last 3 results)  Recent Labs  06/05/14 1823  PROBNP 64.6   CBG:  Recent Labs Lab  06/07/14 1130 06/07/14 1624 06/07/14 2142 06/08/14 0736 06/08/14 1132  GLUCAP 306* 238* 256* 205* 190*     IMAGING STUDIES Dg Chest 2 View  06/05/2014   CLINICAL DATA:  Shortness of breath and cough.  EXAM: CHEST  2 VIEW  COMPARISON:  03/16/2013  FINDINGS: The heart is enlarged but stable. There is tortuosity calcification of the thoracic aorta. There are chronic bronchitic type interstitial lung changes but no acute overlying pulmonary process. No pleural effusion. The bony thorax is intact.  IMPRESSION: Stable cardiac enlargement and chronic bronchitic changes but no definite acute pulmonary findings.   Electronically Signed   By: Kalman Jewels M.D.   On: 06/05/2014 18:58    DISCHARGE EXAMINATION: Filed Vitals:   06/07/14 2121 06/08/14 0602 06/08/14 0745 06/08/14 0850  BP: 135/62 110/57  143/68  Pulse: 87 85  87  Temp: 97.5 F (36.4 C) 97.5 F (36.4 C)  97.5 F (36.4 C)  TempSrc: Oral Oral  Oral  Resp: 20 20  19   Height:      Weight:      SpO2: 95% 94% 98% 95%   General appearance: alert, cooperative, appears stated age, morbidly obese and no distress Resp: clear to auscultation bilaterally Cardio: regular rate and rhythm, S1, S2 normal, no murmur, click, rub or gallop GI: soft, non-tender; bowel sounds normal; no masses,  no organomegaly  DISPOSITION: Home  Discharge Instructions   Call MD for:  difficulty breathing, headache or visual disturbances    Complete by:  As directed      Call MD for:  extreme fatigue    Complete by:  As directed      Call MD for:  persistant dizziness or light-headedness    Complete by:  As directed      Call MD for:  temperature >100.4    Complete by:  As directed      Diet Carb Modified    Complete by:  As directed      Discharge instructions    Complete by:  As directed   Please follow up with your PCP. You will need Pulmonary Function test as outpatient. Please have your PCP check your potassium level at follow up as it was high  here. That is the reason the Losartan (Cozaar) has been stopped.     Increase activity slowly    Complete by:  As directed            ALLERGIES:  Allergies  Allergen Reactions  . Adhesive [Tape] Hives and Rash  . Vancomycin Rash  .  Zosyn [Piperacillin Sod-Tazobactam So] Rash    Current Discharge Medication List    START taking these medications   Details  fluticasone (FLOVENT HFA) 110 MCG/ACT inhaler Inhale 2 puffs into the lungs 2 (two) times daily. Qty: 1 Inhaler, Refills: 12    guaiFENesin-dextromethorphan (ROBITUSSIN DM) 100-10 MG/5ML syrup Take 5 mLs by mouth every 4 (four) hours as needed for cough. Qty: 118 mL, Refills: 0    ipratropium-albuterol (DUONEB) 0.5-2.5 (3) MG/3ML SOLN Take 4 times daily for 2 days and then as needed for wheezing, shortness of breath. Qty: 360 mL, Refills: 3    levofloxacin (LEVAQUIN) 500 MG tablet Take 1 tablet (500 mg total) by mouth daily. For 4 more days Qty: 4 tablet, Refills: 0    pantoprazole (PROTONIX) 40 MG tablet Take 1 tablet (40 mg total) by mouth daily. Qty: 30 tablet, Refills: 0    predniSONE (DELTASONE) 20 MG tablet Take 3 tablets once daily for 4 days, then take 2 tablets once daily for 4 days, then take 1 tablet once daily for 4 days, then STOP. Qty: 24 tablet, Refills: 0      CONTINUE these medications which have NOT CHANGED   Details  albuterol (PROVENTIL HFA;VENTOLIN HFA) 108 (90 BASE) MCG/ACT inhaler Inhale 2 puffs into the lungs every 4 (four) hours as needed for wheezing. Qty: 1 Inhaler, Refills: 3    aspirin 81 MG tablet Take 162 mg by mouth daily.     CALCIUM-VITAMIN D PO Take 1 tablet by mouth 2 (two) times daily. 600 d2    cholecalciferol (VITAMIN D) 1000 UNITS tablet Take 1,000 Units by mouth daily.    Chromium-Cinnamon (CINNAMON PLUS CHROMIUM PO) Take 3 capsules by mouth daily. 600mg     furosemide (LASIX) 20 MG tablet Take 20 mg by mouth as needed for fluid or edema.     meloxicam (MOBIC) 15 MG tablet  Take 15 mg by mouth 2 (two) times daily.     metFORMIN (GLUCOPHAGE) 500 MG tablet Take 1,000 mg by mouth 2 (two) times daily with a meal.     Misc Natural Products (GLUCOSAMINE-CHONDROITIN PLUS PO) Take 2 tablets by mouth daily. Patient takes one in am and one in pm    montelukast (SINGULAIR) 10 MG tablet Take 10 mg by mouth daily.     sitaGLIPtin (JANUVIA) 50 MG tablet Take 50 mg by mouth every morning.     traMADol (ULTRAM) 50 MG tablet Take 50 mg by mouth at bedtime as needed for moderate pain.     VIVELLE-DOT 0.05 MG/24HR patch Place 1 patch onto the skin 2 (two) times a week.       STOP taking these medications     losartan (COZAAR) 100 MG tablet        Follow-up Information   Follow up with [name], MD. Schedule an appointment as soon as possible for a visit in 4 days. (post hospitalization follow up. Patient will need a potassium level checked at follow up. Cozaar has been held for hyperkalemia.)    Specialty:  Family Medicine   Contact information:   [place] [id number] [phone]       TOTAL DISCHARGE TIME: 35 mins  [facility] Pager [phone]  06/08/2014, 11:42 AM  Disclaimer: This note was dictated with voice recognition software. Similar sounding words can inadvertently be transcribed and may not be corrected upon review.

## 2014-06-16 ENCOUNTER — Institutional Professional Consult (permissible substitution): Payer: Medicare Other | Admitting: Internal Medicine

## 2014-06-17 ENCOUNTER — Institutional Professional Consult (permissible substitution): Payer: Medicare Other | Admitting: Internal Medicine

## 2014-06-18 ENCOUNTER — Encounter (HOSPITAL_COMMUNITY): Payer: Self-pay | Admitting: Hematology and Oncology

## 2014-06-22 ENCOUNTER — Encounter (HOSPITAL_COMMUNITY): Payer: Medicare Other | Attending: Hematology and Oncology

## 2014-06-22 ENCOUNTER — Encounter (HOSPITAL_COMMUNITY): Payer: Self-pay

## 2014-06-22 ENCOUNTER — Encounter (HOSPITAL_COMMUNITY): Payer: Medicare Other

## 2014-06-22 VITALS — BP 128/73 | HR 80 | Temp 97.7°F | Resp 22

## 2014-06-22 DIAGNOSIS — D509 Iron deficiency anemia, unspecified: Secondary | ICD-10-CM | POA: Insufficient documentation

## 2014-06-22 DIAGNOSIS — G4733 Obstructive sleep apnea (adult) (pediatric): Secondary | ICD-10-CM

## 2014-06-22 DIAGNOSIS — B9562 Methicillin resistant Staphylococcus aureus infection as the cause of diseases classified elsewhere: Secondary | ICD-10-CM

## 2014-06-22 DIAGNOSIS — Z23 Encounter for immunization: Secondary | ICD-10-CM

## 2014-06-22 DIAGNOSIS — IMO0001 Reserved for inherently not codable concepts without codable children: Secondary | ICD-10-CM

## 2014-06-22 DIAGNOSIS — D473 Essential (hemorrhagic) thrombocythemia: Secondary | ICD-10-CM

## 2014-06-22 DIAGNOSIS — D75839 Thrombocytosis, unspecified: Secondary | ICD-10-CM

## 2014-06-22 DIAGNOSIS — T814XXS Infection following a procedure, sequela: Secondary | ICD-10-CM

## 2014-06-22 DIAGNOSIS — E611 Iron deficiency: Secondary | ICD-10-CM

## 2014-06-22 DIAGNOSIS — E119 Type 2 diabetes mellitus without complications: Secondary | ICD-10-CM

## 2014-06-22 DIAGNOSIS — D500 Iron deficiency anemia secondary to blood loss (chronic): Secondary | ICD-10-CM

## 2014-06-22 DIAGNOSIS — I10 Essential (primary) hypertension: Secondary | ICD-10-CM

## 2014-06-22 MED ORDER — INFLUENZA VAC SPLIT QUAD 0.5 ML IM SUSY
0.5000 mL | PREFILLED_SYRINGE | Freq: Once | INTRAMUSCULAR | Status: AC
  Administered 2014-06-22: 0.5 mL via INTRAMUSCULAR
  Filled 2014-06-22: qty 0.5

## 2014-06-22 NOTE — Progress Notes (Signed)
[name] presents today for injection per MD orders. Flu vaccine administered IM in left Upper Arm. Administration without incident. Patient tolerated well.

## 2014-06-22 NOTE — Patient Instructions (Signed)
[facility] Discharge Instructions  RECOMMENDATIONS MADE BY THE CONSULTANT AND ANY TEST RESULTS WILL BE SENT TO YOUR REFERRING PHYSICIAN.  EXAM FINDINGS BY THE PHYSICIAN TODAY AND SIGNS OR SYMPTOMS TO REPORT TO CLINIC OR PRIMARY PHYSICIAN: Exam and findings as discussed by Dr. Barnet Glasgow.  Your labs were stable from your hospitalization and we do not need to recheck today.  Report increased fatigue or other concerns.  MEDICATIONS PRESCRIBED:  none  INSTRUCTIONS/FOLLOW-UP: Follow-up in 4 months with labs and office visit.  Thank you for choosing [facility] to provide your oncology and hematology care.  To afford each patient quality time with our providers, please arrive at least 15 minutes before your scheduled appointment time.  With your help, our goal is to use those 15 minutes to complete the necessary work-up to ensure our physicians have the information they need to help with your evaluation and healthcare recommendations.    Effective January 1st, 2014, we ask that you re-schedule your appointment with our physicians should you arrive 10 or more minutes late for your appointment.  We strive to give you quality time with our providers, and arriving late affects you and other patients whose appointments are after yours.    Again, thank you for choosing [facility].  Our hope is that these requests will decrease the amount of time that you wait before being seen by our physicians.       _____________________________________________________________  Should you have questions after your visit to [facility], please contact our office at [phone] between the hours of 8:30 a.m. and 4:30 p.m.  Voicemails left after 4:30 p.m. will not be returned until the following business day.  For prescription refill requests, have your pharmacy contact our office with your prescription refill request.     _______________________________________________________________  We hope that we have given you very good care.  You may receive a patient satisfaction survey in the mail, please complete it and return it as soon as possible.  We value your feedback!  _______________________________________________________________  Have you asked about our STAR program?  STAR stands for Survivorship Training and Rehabilitation, and this is a nationally recognized cancer care program that focuses on survivorship and rehabilitation.  Cancer and cancer treatments may cause problems, such as, pain, making you feel tired and keeping you from doing the things that you need or want to do. Cancer rehabilitation can help. Our goal is to reduce these troubling effects and help you have the best quality of life possible.  You may receive a survey from a nurse that asks questions about your current state of health.  Based on the survey results, all eligible patients will be referred to the [facility] program for an evaluation so we can better serve you!  A frequently asked questions sheet is available upon request.

## 2014-06-22 NOTE — Progress Notes (Signed)
[facility]  OFFICE PROGRESS NOTE  [name], MD [place] [address]  DIAGNOSIS: Iron deficiency anemia due to chronic blood loss  Postoperative MRSA infection of lower abdominal wound, sequela  OBSTRUCTIVE SLEEP APNEA  Thrombocytosis, reactive  Chief Complaint  Patient presents with  . thrombocytosis, reactIve  . Iron deficiency    Feraheme 04/18/2014    CURRENT THERAPY: Feraheme on 04/18/2014  INTERVAL HISTORY: [name] 73 y.o. female returns for followup of iron deficiency with reactive thrombocytosis, etiology of iron deficiency obscure. Ferritin on 03/22/2014 was 11 so Feraheme at 110 mg was given intravenously on 04/12/2014 and 04/19/2014. On 06/05/2014 platelet count was 276,000 with hemoglobin 14.1 She was hospitalized for 3 days with acute bronchitis. Appetite is good with no nausea, vomiting, or change in her abdominal wound. Her husband packs a daily with sterile gauze. She denies any melena, hematochezia, hematuria, vaginal bleeding, epistaxis, or hemoptysis. Denies any worsening lower 70 swelling or redness, chest pain, PND, orthopnea, or palpitations. She continues to use CPAP.  MEDICAL HISTORY: Past Medical History  Diagnosis Date  . Hypertension   . Diabetes mellitus   . Arthritis   . Anemia   . Diverticulitis   . Shortness of breath     with exertion   . Sleep apnea     cpap at 10   . 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    ALLERGIES:  is allergic to adhesive; vancomycin; and zosyn.  MEDICATIONS: has a current medication list which includes the following prescription(s): albuterol, aspirin, calcium-vitamin d, cholecalciferol, chromium-cinnamon, fluticasone, furosemide, guaifenesin-dextromethorphan, ipratropium-albuterol, meloxicam, metformin, misc natural products, montelukast, sitagliptin, tramadol, vivelle-dot, levofloxacin, pantoprazole, and prednisone.  SURGICAL HISTORY:  Past Surgical History  Procedure Laterality Date  . Appendectomy    . Back surgery    . Colon resection  10/09/2011    Procedure: COLON RESECTION;  Surgeon: Gayland Curry, MD;  Location: [facility];  Service: General;  Laterality: N/A;  Colon Resection with colostomy  . Colostomy    . Jackson pratt    . Application of wound vac  wound vac  . Abdominal hysterectomy  1979  . Proctoscopy  06/26/2012    Procedure: PROCTOSCOPY;  Surgeon: Gayland Curry, MD,FACS;  Location: [facility];  Service: General;  Laterality: N/A;  Rigid Proctoscopy  . Colon resection  06/26/2012    Procedure: COLON RESECTION;  Surgeon: Gayland Curry, MD,FACS;  Location: [facility];  Service: General;  Laterality: N/A;  . Application of wound vac  06/26/2012    Procedure: APPLICATION OF WOUND VAC;  Surgeon: Gayland Curry, MD,FACS;  Location: [facility];  Service: General;;    FAMILY HISTORY: family history includes Diabetes in her brother, father, mother, and sister; Heart disease in her brother and father; Hypertension in her father and mother.  SOCIAL HISTORY:  reports that she quit smoking about 2 years ago. She has never used smokeless tobacco. She reports that she does not drink alcohol or use illicit drugs.  REVIEW OF SYSTEMS:  Other than that discussed  above is noncontributory.  PHYSICAL EXAMINATION: ECOG PERFORMANCE STATUS: 1 - Symptomatic but completely ambulatory  Blood pressure 128/73, pulse 80, temperature 97.7 F (36.5 C), temperature source Oral,  resp. rate 22, weight 0 lb (0 kg), SpO2 92.00%.  GENERAL:alert, no distress and comfortable. Morbidly obese. SKIN: skin color, texture, turgor are normal, no rashes or significant lesions EYES: PERLA; Conjunctiva are pink and non-injected, sclera clear SINUSES: No redness or tenderness over maxillary or ethmoid sinuses OROPHARYNX:no exudate, no erythema on lips, buccal mucosa, or tongue. NECK: supple, thyroid normal size, non-tender, without nodularity. No masses CHEST: Normal AP diameter with no breast masses. LYMPH:  no palpable lymphadenopathy in the cervical, axillary or inguinal LUNGS: clear to auscultation and percussion with normal breathing effort HEART: regular rate & rhythm and no murmurs. ABDOMEN:abdomen soft, non-tender and normal bowel sounds. Massively obese with no appreciation of organomegaly. Surgical wound is packed and cover. MUSCULOSKELETAL:no cyanosis of digits and no clubbing. Range of motion normal.  NEURO: alert & oriented x 3 with fluent speech, no focal motor/sensory deficits   LABORATORY DATA: Admission on 06/05/2014, Discharged on 06/08/2014  Component Date Value Ref Range Status  . Sodium 06/05/2014 141  137 - 147 mEq/L Final  . Potassium 06/05/2014 4.4  3.7 - 5.3 mEq/L Final  . Chloride 06/05/2014 98  96 - 112 mEq/L Final  . CO2 06/05/2014 28  19 - 32 mEq/L Final  . Glucose, Bld 06/05/2014 136* 70 - 99 mg/dL Final  . BUN 06/05/2014 19  6 - 23 mg/dL Final  . Creatinine, Ser 06/05/2014 0.88  0.50 - 1.10 mg/dL Final  . Calcium 06/05/2014 9.9  8.4 - 10.5 mg/dL Final  . GFR calc non Af Amer 06/05/2014 63* >90 mL/min Final  . GFR calc Af Amer 06/05/2014 73* >90 mL/min Final   Comment: (NOTE)                          The eGFR has been calculated using the CKD EPI equation.                          This calculation has not been validated in all clinical situations.                          eGFR's persistently <90 mL/min signify possible Chronic Kidney                           Disease.  . Anion gap 06/05/2014 15  5 - 15 Final  . WBC 06/05/2014 12.6* 4.0 - 10.5 K/uL Final  . RBC 06/05/2014 5.32* 3.87 - 5.11 MIL/uL Final  . Hemoglobin 06/05/2014 14.1  12.0 - 15.0 g/dL Final  . HCT 06/05/2014 45.3  36.0 - 46.0 % Final  . MCV 06/05/2014 85.2  78.0 - 100.0 fL Final  . MCH 06/05/2014 26.5  26.0 - 34.0 pg Final  . MCHC 06/05/2014 31.1  30.0 - 36.0 g/dL Final  . RDW 06/05/2014 21.7* 11.5 - 15.5 % Final  . Platelets 06/05/2014 276  150 - 400 K/uL Final  . Pro B Natriuretic peptide (BNP) 06/05/2014 64.6  0 - 125 pg/mL Final  . Troponin i, poc 06/05/2014 0.00  0.00 - 0.08 ng/mL Final  . Comment 3 06/05/2014          Final   Comment: Due to the  release kinetics of cTnI,                          a negative result within the first hours                          of the onset of symptoms does not rule out                          myocardial infarction with certainty.                          If myocardial infarction is still suspected,                          repeat the test at appropriate intervals.  Marland Kitchen D-Dimer, Quant 06/05/2014 0.72* 0.00 - 0.48 ug/mL-FEU Final   Comment:                                 AT THE INHOUSE ESTABLISHED CUTOFF                          VALUE OF 0.48 ug/mL FEU,                          THIS ASSAY HAS BEEN DOCUMENTED                          IN THE LITERATURE TO HAVE                          A SENSITIVITY AND NEGATIVE                          PREDICTIVE VALUE OF AT LEAST                          98 TO 99%.  THE TEST RESULT                          SHOULD BE CORRELATED WITH                          AN ASSESSMENT OF THE CLINICAL                          PROBABILITY OF DVT / VTE.  Marland Kitchen Sodium 06/06/2014 140  137 - 147 mEq/L Final  . Potassium 06/06/2014 4.8  3.7 - 5.3 mEq/L Final  . Chloride 06/06/2014 97  96 - 112 mEq/L Final  . CO2 06/06/2014 26  19 - 32 mEq/L Final  . Glucose, Bld 06/06/2014 288* 70 - 99 mg/dL Final  . BUN  06/06/2014 22  6 - 23 mg/dL Final  . Creatinine, Ser 06/06/2014 0.94  0.50 - 1.10 mg/dL Final  . Calcium 06/06/2014 9.4  8.4 - 10.5 mg/dL Final  . GFR calc non Af Amer 06/06/2014 58* >90 mL/min Final  . GFR calc Af Amer 06/06/2014 68* >90 mL/min Final   Comment: (NOTE)  The eGFR has been calculated using the CKD EPI equation.                          This calculation has not been validated in all clinical situations.                          eGFR's persistently <90 mL/min signify possible Chronic Kidney                          Disease.  . Anion gap 06/06/2014 17* 5 - 15 Final  . WBC 06/06/2014 13.3* 4.0 - 10.5 K/uL Final  . RBC 06/06/2014 4.97  3.87 - 5.11 MIL/uL Final  . Hemoglobin 06/06/2014 13.1  12.0 - 15.0 g/dL Final  . HCT 06/06/2014 42.4  36.0 - 46.0 % Final  . MCV 06/06/2014 85.3  78.0 - 100.0 fL Final  . MCH 06/06/2014 26.4  26.0 - 34.0 pg Final  . MCHC 06/06/2014 30.9  30.0 - 36.0 g/dL Final  . RDW 06/06/2014 21.8* 11.5 - 15.5 % Final  . Platelets 06/06/2014 279  150 - 400 K/uL Final  . MRSA by PCR 06/06/2014 NEGATIVE  NEGATIVE Final   Comment:                                 The GeneXpert MRSA Assay (FDA                          approved for NASAL specimens                          only), is one component of a                          comprehensive MRSA colonization                          surveillance program. It is not                          intended to diagnose MRSA                          infection nor to guide or                          monitor treatment for                          MRSA infections.  . Glucose-Capillary 06/05/2014 184* 70 - 99 mg/dL Final  . Glucose-Capillary 06/06/2014 258* 70 - 99 mg/dL Final  . Glucose-Capillary 06/06/2014 212* 70 - 99 mg/dL Final  . Glucose-Capillary 06/06/2014 243* 70 - 99 mg/dL Final  . WBC 06/07/2014 18.0* 4.0 - 10.5 K/uL Final  . RBC 06/07/2014 4.96  3.87 - 5.11 MIL/uL Final  . Hemoglobin  06/07/2014 12.8  12.0 - 15.0 g/dL Final  . HCT 06/07/2014 41.6  36.0 - 46.0 % Final  . MCV 06/07/2014 83.9  78.0 - 100.0 fL Final  . MCH 06/07/2014 25.8* 26.0 - 34.0 pg Final  .  MCHC 06/07/2014 30.8  30.0 - 36.0 g/dL Final  . RDW 06/07/2014 21.8* 11.5 - 15.5 % Final  . Platelets 06/07/2014 296  150 - 400 K/uL Final  . Sodium 06/07/2014 136* 137 - 147 mEq/L Final  . Potassium 06/07/2014 5.1  3.7 - 5.3 mEq/L Final  . Chloride 06/07/2014 97  96 - 112 mEq/L Final  . CO2 06/07/2014 26  19 - 32 mEq/L Final  . Glucose, Bld 06/07/2014 299* 70 - 99 mg/dL Final  . BUN 06/07/2014 34* 6 - 23 mg/dL Final   DELTA CHECK NOTED  . Creatinine, Ser 06/07/2014 0.97  0.50 - 1.10 mg/dL Final  . Calcium 06/07/2014 8.6  8.4 - 10.5 mg/dL Final  . GFR calc non Af Amer 06/07/2014 56* >90 mL/min Final  . GFR calc Af Amer 06/07/2014 65* >90 mL/min Final   Comment: (NOTE)                          The eGFR has been calculated using the CKD EPI equation.                          This calculation has not been validated in all clinical situations.                          eGFR's persistently <90 mL/min signify possible Chronic Kidney                          Disease.  . Anion gap 06/07/2014 13  5 - 15 Final  . Hemoglobin A1C 06/07/2014 6.9* <5.7 % Final   Comment: (NOTE)                                                                                                                         According to the ADA Clinical Practice Recommendations for 2011, when                          HbA1c is used as a screening test:                           >=6.5%   Diagnostic of Diabetes Mellitus                                    (if abnormal result is confirmed)                          5.7-6.4%   Increased risk of developing Diabetes Mellitus                          References:Diagnosis and Classification of  Diabetes Mellitus,Diabetes                          [id number],[id number](KWIOX 1):S62-S69 and Standards of Medical Care in                                   Diabetes - 2011,Diabetes [id number],2992,42 (Suppl 1):S11-S61.  . Mean Plasma Glucose 06/07/2014 151* <117 mg/dL Final   Performed at [facility]  . Glucose-Capillary 06/06/2014 275* 70 - 99 mg/dL Final  . Glucose-Capillary 06/07/2014 271* 70 - 99 mg/dL Final  . Glucose-Capillary 06/07/2014 306* 70 - 99 mg/dL Final  . Glucose-Capillary 06/07/2014 238* 70 - 99 mg/dL Final  . WBC 06/08/2014 18.4* 4.0 - 10.5 K/uL Final  . RBC 06/08/2014 4.94  3.87 - 5.11 MIL/uL Final  . Hemoglobin 06/08/2014 12.9  12.0 - 15.0 g/dL Final  . HCT 06/08/2014 41.9  36.0 - 46.0 % Final  . MCV 06/08/2014 84.8  78.0 - 100.0 fL Final  . MCH 06/08/2014 26.1  26.0 - 34.0 pg Final  . MCHC 06/08/2014 30.8  30.0 - 36.0 g/dL Final  . RDW 06/08/2014 21.6* 11.5 - 15.5 % Final  . Platelets 06/08/2014 304  150 - 400 K/uL Final  . Sodium 06/08/2014 140  137 - 147 mEq/L Final  . Potassium 06/08/2014 5.8* 3.7 - 5.3 mEq/L Final  . Chloride 06/08/2014 102  96 - 112 mEq/L Final  . CO2 06/08/2014 29  19 - 32 mEq/L Final  . Glucose, Bld 06/08/2014 285* 70 - 99 mg/dL Final  . BUN 06/08/2014 39* 6 - 23 mg/dL Final  . Creatinine, Ser 06/08/2014 0.90  0.50 - 1.10 mg/dL Final  . Calcium 06/08/2014 8.4  8.4 - 10.5 mg/dL Final  . GFR calc non Af Amer 06/08/2014 61* >90 mL/min Final  . GFR calc Af Amer 06/08/2014 71* >90 mL/min Final   Comment: (NOTE)                          The eGFR has been calculated using the CKD EPI equation.                          This calculation has not been validated in all clinical situations.                          eGFR's persistently <90 mL/min signify possible Chronic Kidney                          Disease.  . Anion gap 06/08/2014 9  5 - 15 Final  . Glucose-Capillary 06/07/2014 256* 70 - 99 mg/dL Final  . Glucose-Capillary 06/08/2014 205* 70 - 99 mg/dL Final  . Glucose-Capillary 06/08/2014 190* 70 - 99 mg/dL Final  . Comment 1 06/08/2014 Notify RN   Final     PATHOLOGY: No new pathology.  Urinalysis    Component Value Date/Time   COLORURINE YELLOW 03/14/2013 1430   APPEARANCEUR CLEAR 03/14/2013 1430   LABSPEC 1.019 03/14/2013 1430   PHURINE 5.5 03/14/2013 1430   GLUCOSEU NEGATIVE 03/14/2013 1430   HGBUR NEGATIVE 03/14/2013 1430   BILIRUBINUR NEGATIVE 03/14/2013 1430   KETONESUR NEGATIVE 03/14/2013 1430   PROTEINUR NEGATIVE 03/14/2013 1430   UROBILINOGEN 0.2 03/14/2013 1430  NITRITE NEGATIVE 03/14/2013 1430   LEUKOCYTESUR NEGATIVE 03/14/2013 1430    RADIOGRAPHIC STUDIES: Dg Chest 2 View  06/05/2014   CLINICAL DATA:  Shortness of breath and cough.  EXAM: CHEST  2 VIEW  COMPARISON:  03/16/2013  FINDINGS: The heart is enlarged but stable. There is tortuosity calcification of the thoracic aorta. There are chronic bronchitic type interstitial lung changes but no acute overlying pulmonary process. No pleural effusion. The bony thorax is intact.  IMPRESSION: Stable cardiac enlargement and chronic bronchitic changes but no definite acute pulmonary findings.   Electronically Signed   By: Kalman Jewels M.D.   On: 06/05/2014 18:58    ASSESSMENT:  #1. Chronic blood loss with iron deficiency, etiology obscure with reactive thrombocytosis, status post iron therapy, tolerated well with normalization of hemoglobin.  #2. 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  #3. Morbid obesity.  #4. 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  #5. Diabetes mellitus, type II, non-insulin requiring.  #6. Hypertension, on treatment.  #7. Hyperactive airway disease    PLAN:  #1. Influenza virus vaccine today. #2. Followup in 4 months with CBC and ferritin.   All questions were answered. The patient knows to call the clinic with any problems, questions or concerns. We can certainly see the patient much sooner if necessary.   I spent 25 minutes counseling the patient face to face. The total time spent in the appointment was 30 minutes.    Doroteo Bradford, MD 06/22/2014 7:47 PM  DISCLAIMER:  This note was dictated with voice recognition software.  Similar sounding words can inadvertently be transcribed inaccurately and may not be corrected upon review.

## 2014-06-24 DIAGNOSIS — L03119 Cellulitis of unspecified part of limb: Secondary | ICD-10-CM

## 2014-06-24 HISTORY — DX: Cellulitis of unspecified part of limb: L03.119

## 2014-06-25 ENCOUNTER — Emergency Department (HOSPITAL_COMMUNITY)
Admission: EM | Admit: 2014-06-25 | Discharge: 2014-06-25 | Disposition: A | Discharge status: Home / Self Care | Payer: Medicare Other | Attending: Emergency Medicine | Admitting: Emergency Medicine

## 2014-06-25 ENCOUNTER — Encounter (HOSPITAL_COMMUNITY): Payer: Self-pay | Admitting: Emergency Medicine

## 2014-06-25 DIAGNOSIS — Z79899 Other long term (current) drug therapy: Secondary | ICD-10-CM | POA: Insufficient documentation

## 2014-06-25 DIAGNOSIS — E119 Type 2 diabetes mellitus without complications: Secondary | ICD-10-CM | POA: Insufficient documentation

## 2014-06-25 DIAGNOSIS — Z7951 Long term (current) use of inhaled steroids: Secondary | ICD-10-CM | POA: Diagnosis not present

## 2014-06-25 DIAGNOSIS — Z87442 Personal history of urinary calculi: Secondary | ICD-10-CM | POA: Diagnosis not present

## 2014-06-25 DIAGNOSIS — Z8709 Personal history of other diseases of the respiratory system: Secondary | ICD-10-CM | POA: Insufficient documentation

## 2014-06-25 DIAGNOSIS — G4730 Sleep apnea, unspecified: Secondary | ICD-10-CM | POA: Insufficient documentation

## 2014-06-25 DIAGNOSIS — M1990 Unspecified osteoarthritis, unspecified site: Secondary | ICD-10-CM | POA: Insufficient documentation

## 2014-06-25 DIAGNOSIS — Z862 Personal history of diseases of the blood and blood-forming organs and certain disorders involving the immune mechanism: Secondary | ICD-10-CM | POA: Insufficient documentation

## 2014-06-25 DIAGNOSIS — Z9981 Dependence on supplemental oxygen: Secondary | ICD-10-CM | POA: Insufficient documentation

## 2014-06-25 DIAGNOSIS — I10 Essential (primary) hypertension: Secondary | ICD-10-CM | POA: Diagnosis not present

## 2014-06-25 DIAGNOSIS — R739 Hyperglycemia, unspecified: Secondary | ICD-10-CM

## 2014-06-25 DIAGNOSIS — Z7982 Long term (current) use of aspirin: Secondary | ICD-10-CM | POA: Insufficient documentation

## 2014-06-25 DIAGNOSIS — R1110 Vomiting, unspecified: Secondary | ICD-10-CM | POA: Diagnosis not present

## 2014-06-25 LAB — CBC WITH DIFFERENTIAL/PLATELET
BASOS PCT: 0 % (ref 0–1)
Basophils Absolute: 0 10*3/uL (ref 0.0–0.1)
EOS PCT: 5 % (ref 0–5)
Eosinophils Absolute: 0.5 10*3/uL (ref 0.0–0.7)
HCT: 40.3 % (ref 36.0–46.0)
HEMOGLOBIN: 12.9 g/dL (ref 12.0–15.0)
LYMPHS ABS: 1.7 10*3/uL (ref 0.7–4.0)
Lymphocytes Relative: 16 % (ref 12–46)
MCH: 26.9 pg (ref 26.0–34.0)
MCHC: 32 g/dL (ref 30.0–36.0)
MCV: 84 fL (ref 78.0–100.0)
MONOS PCT: 7 % (ref 3–12)
Monocytes Absolute: 0.7 10*3/uL (ref 0.1–1.0)
NEUTROS PCT: 72 % (ref 43–77)
Neutro Abs: 7.4 10*3/uL (ref 1.7–7.7)
PLATELETS: 271 10*3/uL (ref 150–400)
RBC: 4.8 MIL/uL (ref 3.87–5.11)
RDW: 19.8 % — ABNORMAL HIGH (ref 11.5–15.5)
WBC: 10.3 10*3/uL (ref 4.0–10.5)

## 2014-06-25 LAB — COMPREHENSIVE METABOLIC PANEL
ALBUMIN: 3.1 g/dL — AB (ref 3.5–5.2)
ALT: 57 U/L — ABNORMAL HIGH (ref 0–35)
ANION GAP: 14 (ref 5–15)
AST: 22 U/L (ref 0–37)
Alkaline Phosphatase: 94 U/L (ref 39–117)
BUN: 16 mg/dL (ref 6–23)
CALCIUM: 8.8 mg/dL (ref 8.4–10.5)
CO2: 28 mEq/L (ref 19–32)
CREATININE: 0.89 mg/dL (ref 0.50–1.10)
Chloride: 98 mEq/L (ref 96–112)
GFR calc non Af Amer: 62 mL/min — ABNORMAL LOW (ref >90)
GFR, EST AFRICAN AMERICAN: 72 mL/min — AB (ref >90)
GLUCOSE: 236 mg/dL — AB (ref 70–99)
Potassium: 4.6 mEq/L (ref 3.7–5.3)
Sodium: 140 mEq/L (ref 137–147)
TOTAL PROTEIN: 6.9 g/dL (ref 6.0–8.3)
Total Bilirubin: 0.5 mg/dL (ref 0.3–1.2)

## 2014-06-25 LAB — CBG MONITORING, ED: GLUCOSE-CAPILLARY: 225 mg/dL — AB (ref 70–99)

## 2014-06-25 LAB — URINALYSIS, ROUTINE W REFLEX MICROSCOPIC
Bilirubin Urine: NEGATIVE
Glucose, UA: NEGATIVE mg/dL
Hgb urine dipstick: NEGATIVE
Ketones, ur: NEGATIVE mg/dL
LEUKOCYTES UA: NEGATIVE
NITRITE: NEGATIVE
PROTEIN: NEGATIVE mg/dL
SPECIFIC GRAVITY, URINE: 1.02 (ref 1.005–1.030)
UROBILINOGEN UA: 0.2 mg/dL (ref 0.0–1.0)
pH: 5.5 (ref 5.0–8.0)

## 2014-06-25 NOTE — Discharge Instructions (Signed)
As discussed, your evaluation today has been largely reassuring.  But, it is important that you monitor your condition carefully, and do not hesitate to return to the ED if you develop new, or concerning changes in your condition.  Otherwise, please follow-up with your physician for appropriate ongoing care.    Hyperglycemia Hyperglycemia occurs when the glucose (sugar) in your blood is too high. Hyperglycemia can happen for many reasons, but it most often happens to people who do not know they have diabetes or are not managing their diabetes properly.  CAUSES  Whether you have diabetes or not, there are other causes of hyperglycemia. Hyperglycemia can occur when you have diabetes, but it can also occur in other situations that you might not be as aware of, such as: Diabetes  If you have diabetes and are having problems controlling your blood glucose, hyperglycemia could occur because of some of the following reasons:  Not following your meal plan.  Not taking your diabetes medications or not taking it properly.  Exercising less or doing less activity than you normally do.  Being sick. Pre-diabetes  This cannot be ignored. Before people develop Type 2 diabetes, they almost always have "pre-diabetes." This is when your blood glucose levels are higher than normal, but not yet high enough to be diagnosed as diabetes. Research has shown that some long-term damage to the body, especially the heart and circulatory system, may already be occurring during pre-diabetes. If you take action to manage your blood glucose when you have pre-diabetes, you may delay or prevent Type 2 diabetes from developing. Stress  If you have diabetes, you may be "diet" controlled or on oral medications or insulin to control your diabetes. However, you may find that your blood glucose is higher than usual in the hospital whether you have diabetes or not. This is often referred to as "stress hyperglycemia." Stress can  elevate your blood glucose. This happens because of hormones put out by the body during times of stress. If stress has been the cause of your high blood glucose, it can be followed regularly by your caregiver. That way he/she can make sure your hyperglycemia does not continue to get worse or progress to diabetes. Steroids  Steroids are medications that act on the infection fighting system (immune system) to block inflammation or infection. One side effect can be a rise in blood glucose. Most people can produce enough extra insulin to allow for this rise, but for those who cannot, steroids make blood glucose levels go even higher. It is not unusual for steroid treatments to "uncover" diabetes that is developing. It is not always possible to determine if the hyperglycemia will go away after the steroids are stopped. A special blood test called an A1c is sometimes done to determine if your blood glucose was elevated before the steroids were started. SYMPTOMS  Thirsty.  Frequent urination.  Dry mouth.  Blurred vision.  Tired or fatigue.  Weakness.  Sleepy.  Tingling in feet or leg. DIAGNOSIS  Diagnosis is made by monitoring blood glucose in one or all of the following ways:  A1c test. This is a chemical found in your blood.  Fingerstick blood glucose monitoring.  Laboratory results. TREATMENT  First, knowing the cause of the hyperglycemia is important before the hyperglycemia can be treated. Treatment may include, but is not be limited to:  Education.  Change or adjustment in medications.  Change or adjustment in meal plan.  Treatment for an illness, infection, etc.  More frequent  blood glucose monitoring.  Change in exercise plan.  Decreasing or stopping steroids.  Lifestyle changes. HOME CARE INSTRUCTIONS   Test your blood glucose as directed.  Exercise regularly. Your caregiver will give you instructions about exercise. Pre-diabetes or diabetes which comes on with  stress is helped by exercising.  Eat wholesome, balanced meals. Eat often and at regular, fixed times. Your caregiver or nutritionist will give you a meal plan to guide your sugar intake.  Being at an ideal weight is important. If needed, losing as little as 10 to 15 pounds may help improve blood glucose levels. SEEK MEDICAL CARE IF:   You have questions about medicine, activity, or diet.  You continue to have symptoms (problems such as increased thirst, urination, or weight gain). SEEK IMMEDIATE MEDICAL CARE IF:   You are vomiting or have diarrhea.  Your breath smells fruity.  You are breathing faster or slower.  You are very sleepy or incoherent.  You have numbness, tingling, or pain in your feet or hands.  You have chest pain.  Your symptoms get worse even though you have been following your caregiver's orders.  If you have any other questions or concerns. Document Released: 03/06/2001 Document Revised: 12/03/2011 Document Reviewed: 01/07/2012 [facility] Patient Information 2015 [place], [place]. This information is not intended to replace advice given to you by your health care provider. Make sure you discuss any questions you have with your health care provider.

## 2014-06-25 NOTE — ED Notes (Signed)
Pt alert & oriented x4, stable gait. Patient given discharge instructions, paperwork & prescription(s). Patient  instructed to stop at the registration desk to finish any additional paperwork. Patient verbalized understanding. Pt left department w/ no further questions. 

## 2014-06-25 NOTE — ED Provider Notes (Signed)
CSN: [id number]     Arrival date & time 06/25/14  1411 History   First MD Initiated Contact with Patient 06/25/14 1748     Chief Complaint  Patient presents with  . Hyperglycemia     (Consider location/radiation/quality/duration/timing/severity/associated sxs/prior Treatment) HPI Patient presents with concern of nausea, hyperglycemia. Patient is hospitalization, prolonged course of steroids. She notes this during that time she's developed hyperglycemia, which has not improved in spite of stopping her steroids. She currently denies no chest pain, dyspnea, syncope, fever, vomiting, diarrhea.  When she does endorse mild nausea. She states that in spite of taking her typical medication, glucose is still clinically elevated, 300 roughly.  Past Medical History  Diagnosis Date  . Hypertension   . Diabetes mellitus   . Arthritis   . Anemia   . Diverticulitis   . Shortness of breath     with exertion   . Sleep apnea     cpap at 10   . Bronchitis 05/2014   Past Surgical History  Procedure Laterality Date  . Appendectomy    . Back surgery    . Colon resection  10/09/2011    Procedure: COLON RESECTION;  Surgeon: Gayland Curry, MD;  Location: [place];  Service: General;  Laterality: N/A;  Colon Resection with colostomy  . Colostomy    . Jackson pratt    . Application of wound vac  wound vac  . Abdominal hysterectomy  1979  . Proctoscopy  06/26/2012    Procedure: PROCTOSCOPY;  Surgeon: Gayland Curry, MD,FACS;  Location: [facility];  Service: General;  Laterality: N/A;  Rigid Proctoscopy  . Colon resection  06/26/2012    Procedure: COLON RESECTION;  Surgeon: Gayland Curry, MD,FACS;  Location: [facility];  Service: General;  Laterality: N/A;  . Application of wound vac  06/26/2012    Procedure: APPLICATION OF WOUND VAC;  Surgeon: Gayland Curry, MD,FACS;  Location: [facility];  Service: General;;   Family History  Problem Relation Age of Onset  . Heart disease Father   . Diabetes Father   .  Hypertension Father   . Heart disease Brother   . Diabetes Brother   . Diabetes Mother   . Hypertension Mother   . Diabetes Sister    History  Substance Use Topics  . Smoking status: Former Smoker    Quit date: 09/25/2011  . Smokeless tobacco: Never Used  . Alcohol Use: No   OB History   Grav Para Term Preterm Abortions TAB SAB Ect Mult Living                 Review of Systems  Constitutional:       Per HPI, otherwise negative  HENT:       Per HPI, otherwise negative  Respiratory:       Per HPI, otherwise negative  Cardiovascular:       Per HPI, otherwise negative  Gastrointestinal: Positive for nausea. Negative for vomiting.  Endocrine:       Negative aside from HPI  Genitourinary:       Neg aside from HPI   Musculoskeletal:       Per HPI, otherwise negative  Skin: Negative.   Neurological: Negative for syncope.      Allergies  Adhesive; Vancomycin; and Zosyn  Home Medications   Prior to Admission medications   Medication Sig Start Date End Date Taking? Authorizing Provider  albuterol (PROVENTIL HFA;VENTOLIN HFA) 108 (90 BASE) MCG/ACT inhaler Inhale 2 puffs into  the lungs every 4 (four) hours as needed for wheezing. 03/18/13   Melton Alar, PA-C  aspirin 81 MG tablet Take 162 mg by mouth daily.     Historical Provider, MD  CALCIUM-VITAMIN D PO Take 1 tablet by mouth 2 (two) times daily. 600 d2    Historical Provider, MD  cholecalciferol (VITAMIN D) 1000 UNITS tablet Take 1,000 Units by mouth daily.    Historical Provider, MD  Chromium-Cinnamon (CINNAMON PLUS CHROMIUM PO) Take 3 capsules by mouth daily. 600mg     Historical Provider, MD  fluticasone (FLOVENT HFA) 110 MCG/ACT inhaler Inhale 2 puffs into the lungs 2 (two) times daily. 06/08/14   Bonnielee Haff, MD  furosemide (LASIX) 20 MG tablet Take 20 mg by mouth as needed for fluid or edema.  01/09/12   Historical Provider, MD  guaiFENesin-dextromethorphan (ROBITUSSIN DM) 100-10 MG/5ML syrup Take 5 mLs by mouth  every 4 (four) hours as needed for cough. 06/08/14   Bonnielee Haff, MD  ipratropium-albuterol (DUONEB) 0.5-2.5 (3) MG/3ML SOLN Take 4 times daily for 2 days and then as needed for wheezing, shortness of breath. 06/08/14   Bonnielee Haff, MD  levofloxacin (LEVAQUIN) 500 MG tablet Take 1 tablet (500 mg total) by mouth daily. For 4 more days 06/08/14   Bonnielee Haff, MD  meloxicam (MOBIC) 15 MG tablet Take 15 mg by mouth 2 (two) times daily.     Historical Provider, MD  metFORMIN (GLUCOPHAGE) 500 MG tablet Take 1,000 mg by mouth 2 (two) times daily with a meal.     Historical Provider, MD  Misc Natural Products (GLUCOSAMINE-CHONDROITIN PLUS PO) Take 2 tablets by mouth daily. Patient takes one in am and one in pm    Historical Provider, MD  montelukast (SINGULAIR) 10 MG tablet Take 10 mg by mouth daily.     Historical Provider, MD  pantoprazole (PROTONIX) 40 MG tablet Take 1 tablet (40 mg total) by mouth daily. 06/08/14   Bonnielee Haff, MD  predniSONE (DELTASONE) 20 MG tablet Take 3 tablets once daily for 4 days, then take 2 tablets once daily for 4 days, then take 1 tablet once daily for 4 days, then STOP. 06/08/14   Bonnielee Haff, MD  sitaGLIPtin (JANUVIA) 50 MG tablet Take 50 mg by mouth every morning.     Historical Provider, MD  traMADol (ULTRAM) 50 MG tablet Take 50 mg by mouth at bedtime as needed for moderate pain.  01/09/12   Historical Provider, MD  VIVELLE-DOT 0.05 MG/24HR patch Place 1 patch onto the skin 2 (two) times a week.  07/17/13   Historical Provider, MD   BP 129/44  Pulse 88  Temp(Src) 97.9 F (36.6 C) (Oral)  Resp 16  Ht 5\' 5"  (1.651 m)  SpO2 92% Physical Exam  Nursing note and vitals reviewed. Constitutional: She is oriented to person, place, and time. She appears well-developed and well-nourished. No distress.  Morbidly obese female resting in a wheelchair  HENT:  Head: Normocephalic and atraumatic.  Eyes: Conjunctivae and EOM are normal.  Cardiovascular: Normal rate and  regular rhythm.   Pulmonary/Chest: Effort normal and breath sounds normal. No stridor. No respiratory distress.  Abdominal: She exhibits no distension.  Musculoskeletal: She exhibits no edema.  Neurological: She is alert and oriented to person, place, and time. No cranial nerve deficit.  Skin: Skin is warm and dry.  Psychiatric: She has a normal mood and affect.    ED Course  Procedures (including critical care time) Labs Review Labs Reviewed  CBC  WITH DIFFERENTIAL - Abnormal; Notable for the following:    RDW 19.8 (*)    All other components within normal limits  COMPREHENSIVE METABOLIC PANEL - Abnormal; Notable for the following:    Glucose, Bld 236 (*)    Albumin 3.1 (*)    ALT 57 (*)    GFR calc non Af Amer 62 (*)    GFR calc Af Amer 72 (*)    All other components within normal limits  CBG MONITORING, ED - Abnormal; Notable for the following:    Glucose-Capillary 225 (*)    All other components within normal limits  URINALYSIS, ROUTINE W REFLEX MICROSCOPIC   I reviewed the patient's labs, with her and her husband.  MDM   Patient presents with concern nausea, hyperglycemia.  Patient is awake, alert, in no distress.  Patient's recent use of steroids is likely contributory to her hyperglycemia, and is currently no evidence for decompensation, anion gap, distress. Patient was encouraged to continue her home medication, will follow up with primary care in 3 days   Carmin Muskrat, MD 06/25/14 1849

## 2014-06-25 NOTE — ED Notes (Addendum)
Recent adm for bronchitis, and has been on prednisone, CBG elevated today,   225 at triage, Told by Dr Cornelia Copa office to come to ER  Due to elevated cbg  Pt is on 2 L of 02,

## 2014-07-08 ENCOUNTER — Encounter (INDEPENDENT_AMBULATORY_CARE_PROVIDER_SITE_OTHER): Payer: Medicare Other | Admitting: General Surgery

## 2014-07-20 ENCOUNTER — Encounter: Payer: Self-pay | Admitting: Internal Medicine

## 2014-08-13 ENCOUNTER — Ambulatory Visit (HOSPITAL_COMMUNITY)
Admission: RE | Admit: 2014-08-13 | Discharge: 2014-08-13 | Disposition: A | Discharge status: Home / Self Care | Payer: Medicare Other | Source: Ambulatory Visit | Attending: Family Medicine | Admitting: Family Medicine

## 2014-08-13 ENCOUNTER — Encounter (HOSPITAL_COMMUNITY): Payer: Self-pay | Admitting: Physical Therapy

## 2014-08-13 DIAGNOSIS — L03116 Cellulitis of left lower limb: Secondary | ICD-10-CM | POA: Diagnosis present

## 2014-08-13 DIAGNOSIS — L03115 Cellulitis of right lower limb: Secondary | ICD-10-CM

## 2014-08-13 DIAGNOSIS — M7989 Other specified soft tissue disorders: Secondary | ICD-10-CM | POA: Insufficient documentation

## 2014-08-13 NOTE — Therapy (Addendum)
Physical Therapy Evaluation  Patient Details  Name: [name] MRN: [id number] Date of Birth: 04-Jan-1941  Encounter Date: 08/13/2014      PT End of Session - 08/13/14 1243    Visit Number 1   Number of Visits 4   Date for PT Re-Evaluation 09/12/14   Authorization Type [employer] medicare    PT Start Time 6270   PT Stop Time 3500   PT Time Calculation (min) 50 min   Activity Tolerance Patient tolerated treatment well      Past Medical History  Diagnosis Date  . Hypertension   . Diabetes mellitus   . Arthritis   . Anemia   . Diverticulitis   . Shortness of breath     with exertion   . Sleep apnea     cpap at 10   . Bronchitis 05/2014    Past Surgical History  Procedure Laterality Date  . Appendectomy    . Back surgery    . Colon resection  10/09/2011    Procedure: COLON RESECTION;  Surgeon: Gayland Curry, MD;  Location: [place];  Service: General;  Laterality: N/A;  Colon Resection with colostomy  . Colostomy    . Jackson pratt    . Application of wound vac  wound vac  . Abdominal hysterectomy  1979  . Proctoscopy  06/26/2012    Procedure: PROCTOSCOPY;  Surgeon: Gayland Curry, MD,FACS;  Location: [facility];  Service: General;  Laterality: N/A;  Rigid Proctoscopy  . Colon resection  06/26/2012    Procedure: COLON RESECTION;  Surgeon: Gayland Curry, MD,FACS;  Location: [facility];  Service: General;  Laterality: N/A;  . Application of wound vac  06/26/2012    Procedure: APPLICATION OF WOUND VAC;  Surgeon: Gayland Curry, MD,FACS;  Location: [facility];  Service: General;;    There were no vitals taken for this visit.  Visit Diagnosis:  Cellulitis of leg, left  Cellulitis of leg, right Onset: 06/03/2014 Medical diagnosis:  Cellulitis with edema Precautions: cellulitis  Balance screen:  No falls pt is basically wheelchair bound, obese  Prior function:  Needs assist in all ADL's      Subjective Assessment - 08/13/14 1232    Symptoms Pt states that her legs look much  better now.  She began to have swelling in her legs two months ago.  She has been placed on mutipele antibiotics and the last one has seemed to help.  Pt states that she was referred here to put something on her leg for about a week to assist with decreasing the swelling.  Pt states that she is not interested in recieving compression garments even though pt admits that she has cellulits periodically    Pertinent History COPD, diabetci   How long can you sit comfortably? no problem   How long can you stand comfortably? limited due to COPD and morbidity   How long can you walk comfortably? limited due to COPD and morbidity    Currently in Pain? No/denies  did but pain has decreased significantly               PT Education - 08/13/14 1604    Education provided Yes   Education Details Pt instructed if dressing becomes uncomfortable in any way to discontinue.               Plan - 08/13/14 1244    Clinical Impression Statement Pt is a 73 yo female with B LE cellulites secondary  to Erysipelas.  Pt states that this has occured before and she has had to be hospitalized with IV antibiotics.  This last bout began about two months ago.  She has been placed on oral antibiotics and the swelling is much better according to the patient but it just will not totally go away.  Therefore she has been referred to therapy.   The therapist discussed compression stockings to the pt but the patient is opposed to this line of treatment.  We will use multicompression layer bandaging continue to educate pt and if needed perform manual techniques to achieve the goal of decreased swelling.     Pt will benefit from skilled therapeutic intervention in order to improve on the following deficits Difficulty walking;Other (comment)   Rehab Potential Good   PT Frequency 1x / week   PT Treatment/Interventions ADLs/Self Care Home Management;Patient/family education;Manual techniques   PT Next Visit Plan remeasure LE, if  pt tolerated profore-lite may go to profore, may use manual techniqus to decrease swelling.    Consulted and Agree with Plan of Care Patient          G-Codes - 09-09-2014 1608    Functional Assessment Tool Used clinical judgement    Functional Limitation Other PT primary   Other PT Primary Current Status (U7253) At least 20 percent but less than 40 percent impaired, limited or restricted   Other PT Primary Goal Status (G6440) At least 1 percent but less than 20 percent impaired, limited or restricted      Problem List Patient Active Problem List   Diagnosis Date Noted  . COPD exacerbation 06/05/2014  . Hypoxia 06/05/2014  . Iron deficiency anemia due to chronic blood loss 04/12/2014  . Postoperative MRSA infection of lower abdominal wound 03/22/2014  . Thrombocytosis 03/20/2014  . Incisional hernia without mention of obstruction or gangrene 05/13/2013  . Cellulitis 03/14/2013  . Draining postoperative wound 01/27/2013  . S/P colon resection 06/27/2012  . Obesity, Class III, BMI 40-49.9 (morbid obesity) 10/18/2011  . Diverticulitis of colon with perforation sigmoid colectomy and colostomy 1/15 10/09/2011  . Hypertension 10/09/2011  . Diabetes mellitus 10/09/2011  . OBSTRUCTIVE SLEEP APNEA 03/10/2008      09/09/14 09-09-2014   RT LT  MCP 21.60 21.80  ankle(smallest) 25 25.3  calf (largest) 43.60 42.00  knee  41.50 42.20                                                              Sum of squares 4714.77 4660.17  Total Volume 1500.7584 3474.2595           Wound Therapy - 09-09-14 1236    Prior Treatments IV antibiotic    Pain Assessment No/denies pain   Evaluation and Treatment Procedures Explained to Patient/Family Yes   Evaluation and Treatment Procedures agreed to   Wound Properties Date First Assessed: 2014/09/09 Time First Assessed: 1150 Wound Type: Other (Comment) Location: Leg Location Orientation: Left Present on Admission: Yes   Dressing Type  Compression wrap   Dressing Changed New   Dressing Status Clean   Dressing Change Frequency --  once a week   % Wound base Red or Granulating 95%   Wound Length (cm) 1 cm   Wound Width (cm) 1 cm   Wound Depth (cm)  0.1 cm   Drainage Amount None   Treatment Cleansed   Wound Therapy - Clinical Statement Pt has had cellulitis for two months now.  She is interested in temporay compression bandaging but is not interested in compression stockings and is adament about this despite therapist explaining that it wound decrease her risk of cellulitis.  Pt has a small healed area.  Pt will benefit from skilled therapy for one time a week to assist in decreasing swelling    Wound Therapy - Functional Problem List swelling increases the difficulty of walking.    Wound Therapy - Frequency --  one time/week   Wound Therapy - Current Recommendations PT   Wound Plan compression bandaging, keep an eye on wound on Lt LE    Dressing  profore lite   Improve Drainage Characteristics --  LTG:  4weeks:  Pt volume to be decrease by 20% B   Patient/Family will be able to   STG 1 week: verbalize the importance of compression on LE    Additional Wound Therapy Goal  LTG: 4 weeks Pt to state that she is able to walk with greater ease (20%)        Azucena Freed PT/CLT [phone]   08/13/2014, 4:09 PM

## 2014-08-17 ENCOUNTER — Ambulatory Visit (HOSPITAL_COMMUNITY)
Admission: RE | Admit: 2014-08-17 | Discharge: 2014-08-17 | Disposition: A | Discharge status: Home / Self Care | Payer: Medicare Other | Source: Ambulatory Visit | Attending: Family Medicine | Admitting: Family Medicine

## 2014-08-17 DIAGNOSIS — L03116 Cellulitis of left lower limb: Secondary | ICD-10-CM | POA: Diagnosis not present

## 2014-08-17 DIAGNOSIS — L03115 Cellulitis of right lower limb: Secondary | ICD-10-CM

## 2014-08-17 NOTE — Therapy (Signed)
Wound Care Therapy  Patient Details  Name: [name] MRN: [id number] Date of Birth: 04-07-1941  Encounter Date: 08/17/2014      PT End of Session - 08/17/14 1212    Visit Number 2   Number of Visits 4   Date for PT Re-Evaluation 09/12/14   Authorization Type united [employer] medicare    PT Start Time 1105   PT Stop Time 1145   PT Time Calculation (min) 40 min   Activity Tolerance Patient tolerated treatment well      Past Medical History  Diagnosis Date  . Hypertension   . Diabetes mellitus   . Arthritis   . Anemia   . Diverticulitis   . Shortness of breath     with exertion   . Sleep apnea     cpap at 10   . Bronchitis 05/2014    Past Surgical History  Procedure Laterality Date  . Appendectomy    . Back surgery    . Colon resection  10/09/2011    Procedure: COLON RESECTION;  Surgeon: Gayland Curry, MD;  Location: [place];  Service: General;  Laterality: N/A;  Colon Resection with colostomy  . Colostomy    . Jackson pratt    . Application of wound vac  wound vac  . Abdominal hysterectomy  1979  . Proctoscopy  06/26/2012    Procedure: PROCTOSCOPY;  Surgeon: Gayland Curry, MD,FACS;  Location: [facility];  Service: General;  Laterality: N/A;  Rigid Proctoscopy  . Colon resection  06/26/2012    Procedure: COLON RESECTION;  Surgeon: Gayland Curry, MD,FACS;  Location: [facility];  Service: General;  Laterality: N/A;  . Application of wound vac  06/26/2012    Procedure: APPLICATION OF WOUND VAC;  Surgeon: Gayland Curry, MD,FACS;  Location: [facility];  Service: General;;    There were no vitals taken for this visit.  Visit Diagnosis:  Cellulitis of leg, left  Cellulitis of leg, right      Subjective Assessment - 08/17/14 1206    Symptoms Pt states that her legs feelmuch better now, thoguh she didn't like the compression bandages.  She began to have swelling in her legs two months ago.  She has been placed on mutipele antibiotics and the last one has seemed to help.  Pt states  that she was referred here to put something on her leg for about a week to assist with decreasing the swelling.  Pt states that she is still not interested in recieving compression garments even though pt admits that she has cellulits periodically and probably needs them. Patient  was agreeable to try full profore this session.        Date 08/13/2014 08/13/2014 08/17/2014 24-Nov   right left Right Left  MTP 21.60 21.80 22 22.2  Ankle (smallest) 25 25.3 24.40 24.20  Calf (largest) 43.60 42.00 41.50 44.40  knee 41.50 42.20 43.00 43.00                                                                                            Sum of squares 4714.77 4660.17 4650.61 4898.84  Total  Volume 2130.8657 8469.6295 2841.3244 1559.35         PT Education - 08/17/14 1210    Education Details Pt instructed if dressing becomes uncomfortable in any way to discontinue. instructed in improatnce of proper compression dressigns to decrease swelling and to take dressings off if numbness, pain, or discoloration occurs.              Plan - 08/17/14 1214    Clinical Impression Statement The therapist again discussed compression stockings to the pt but the patient is opposed to this line of treatment due to difficulty placing stocking on. Progressed from profore light to profore for  multicompression layer bandaging and continued to educate pt on importance of compression to improve circulation.     PT Next Visit Plan remeasure LE, and continue profore per tolerance , may use manual techniqus to decrease swelling.        Problem List Patient Active Problem List   Diagnosis Date Noted  . COPD exacerbation 06/05/2014  . Hypoxia 06/05/2014  . Iron deficiency anemia due to chronic blood loss 04/12/2014  . Postoperative MRSA infection of lower abdominal wound 03/22/2014  . Thrombocytosis 03/20/2014  . Incisional hernia without mention of obstruction or gangrene 05/13/2013  . Cellulitis  03/14/2013  . Draining postoperative wound 01/27/2013  . S/P colon resection 06/27/2012  . Obesity, Class III, BMI 40-49.9 (morbid obesity) 10/18/2011  . Diverticulitis of colon with perforation sigmoid colectomy and colostomy 1/15 10/09/2011  . Hypertension 10/09/2011  . Diabetes mellitus 10/09/2011  . OBSTRUCTIVE SLEEP APNEA 03/10/2008         Wound Therapy - 08/17/14 1255    Prior Treatments IV antibiotic    Evaluation and Treatment Procedures Explained to Patient/Family Yes   Evaluation and Treatment Procedures agreed to   Wound Properties Date First Assessed: 08/13/14 Time First Assessed: 1150 Wound Type: Other (Comment) Location: Leg Location Orientation: Left Present on Admission: Yes   Dressing Type Compression wrap   Dressing Changed New   Dressing Status Clean   Dressing Change Frequency --  once a week   % Wound base Red or Granulating 95%   Wound Length (cm) 1 cm   Wound Width (cm) 1 cm   Wound Depth (cm) 0.1 cm   Drainage Amount None   Treatment Cleansed   Wound Therapy - Clinical Statement Pt has had cellulitis for two months now.  She is interested in temporay compression bandaging but is not interested in compression stockings and is adament about this despite therapist explaining that it wound decrease her risk of cellulitis.  Pt has a small healed area on Lt anterior shin.  Pt will benefit from skilled therapy for one time a week to assist in decreasing swelling    Wound Therapy - Functional Problem List swelling increases the difficulty of walking.    Wound Therapy - Frequency --  one time/week   Wound Therapy - Current Recommendations PT   Wound Plan compression bandaging, keep an eye on wound on Lt LE    Dressing  profore lite   Improve Drainage Characteristics --  LTG:  4weeks:  Pt volume to be decrease by 20% B   Patient/Family will be able to   STG 1 week: verbalize the importance of compression on LE    Additional Wound Therapy Goal  LTG: 4 weeks Pt to  state that she is able to walk with greater ease (20%)      Devona Konig PT DPT [phone]

## 2014-08-23 ENCOUNTER — Ambulatory Visit (HOSPITAL_COMMUNITY)
Admission: RE | Admit: 2014-08-23 | Discharge: 2014-08-23 | Disposition: A | Discharge status: Home / Self Care | Payer: Medicare Other | Source: Ambulatory Visit | Attending: Family Medicine | Admitting: Family Medicine

## 2014-08-23 DIAGNOSIS — L03116 Cellulitis of left lower limb: Secondary | ICD-10-CM

## 2014-08-23 DIAGNOSIS — L03115 Cellulitis of right lower limb: Secondary | ICD-10-CM

## 2014-08-23 NOTE — Addendum Note (Signed)
Encounter addended by: Leeroy Cha, PT on: 08/23/2014 11:02 AM<BR>     Documentation filed: Clinical Notes

## 2014-08-23 NOTE — Therapy (Signed)
Physical Therapy Treatment  Patient Details  Name: [name] MRN: [id number] Date of Birth: 03/22/1941  Encounter Date: 08/23/2014      PT End of Session - 08/23/14 1212    Visit Number 3   Number of Visits 6   Date for PT Re-Evaluation 09/12/14   Authorization Type united healthcare medicare    PT Start Time 1105   PT Stop Time 1150   PT Time Calculation (min) 45 min   Activity Tolerance Patient tolerated treatment well      Past Medical History  Diagnosis Date  . Hypertension   . Diabetes mellitus   . Arthritis   . Anemia   . Diverticulitis   . Shortness of breath     with exertion   . Sleep apnea     cpap at 10   . Bronchitis 05/2014    Past Surgical History  Procedure Laterality Date  . Appendectomy    . Back surgery    . Colon resection  10/09/2011    Procedure: COLON RESECTION;  Surgeon: Gayland Curry, MD;  Location: [place];  Service: General;  Laterality: N/A;  Colon Resection with colostomy  . Colostomy    . Jackson pratt    . Application of wound vac  wound vac  . Abdominal hysterectomy  1979  . Proctoscopy  06/26/2012    Procedure: PROCTOSCOPY;  Surgeon: Gayland Curry, MD,FACS;  [facility];  Service: General;  Laterality: N/A;  Rigid Proctoscopy  . Colon resection  06/26/2012    Procedure: COLON RESECTION;  Surgeon: Gayland Curry, MD,FACS;  [facility];  Service: General;  Laterality: N/A;  . Application of wound vac  06/26/2012    Procedure: APPLICATION OF WOUND VAC;  Surgeon: Gayland Curry, MD,FACS;  [facility];  Service: General;;    There were no vitals taken for this visit.  Visit Diagnosis:  Cellulitis of leg, left  Cellulitis of leg, right      Subjective Assessment - 08/23/14 1206    Symptoms Pt states that the bandages slid down her legs almost immediately states that she has been trying to pull them up.  Pt continues to state that she will not wear compression garments.   Therapist showed pt juxta lite and pt states that  she would be agreeable to wear these as they would be easier to put on.    Currently in Pain? No/denies            [facility] Adult PT Treatment/Exercise - 08/23/14 1209    Manual Therapy   Manual Therapy Edema management   Edema Management retro massage from ankle to knee as well as lateral thigh.  Followed by application of multilayer compression bandage system,(profore).          PT Education - 08/23/14 1212    Education provided Yes   Education Details The importance of compression in keeping pt edema under control.            PT Short Term Goals - 08/23/14 1223    PT SHORT TERM GOAL #1   Title Pt to verbalize the importance of using compression to manage edema   Time 2   Period Weeks   Status Achieved          PT Long Term Goals - 08/23/14 1223    PT LONG TERM GOAL #1   Title Pt to verbalize that it is 20% easier to walk    Time 4  Period Weeks   PT LONG TERM GOAL #2   Title volume decreaed by 20%   Time 4   Period Weeks   Status New          Plan - 08/23/14 1213    Clinical Impression Statement Pt has agreed to juxta lite for management of her edema.  Pt has signs and sx of lymphedema but was not interested in short stretch or compression after manual but now has agreed.  Prescription has been faxed to MD once signed will need to be forwarded to [facility].  Pt is still insistent that her legs are never swollen except after an episode of cellulitis.  She does ot believe that the reason she had cellulitis is secondary to swelling.  Pt is unable to lie down secondary to severe COPD as well as obesity therfore manual is limited but will add in abdominal while sitting and educate pt on importance of diaphragmic breathing.     Rehab Potential Good   PT Frequency 2x / week  increase to twice a week now that pt will wear compression    PT Duration 4 weeks   PT Treatment/Interventions ADLs/Self Care Home Management;Patient/family education;Manual techniques    PT Next Visit Plan remeasure LE, and continue profore use manual techniqus to decrease swelling .    Consulted and Agree with Plan of Care Patient        Problem List Patient Active Problem List   Diagnosis Date Noted  . COPD exacerbation 06/05/2014  . Hypoxia 06/05/2014  . Iron deficiency anemia due to chronic blood loss 04/12/2014  . Postoperative MRSA infection of lower abdominal wound 03/22/2014  . Thrombocytosis 03/20/2014  . Incisional hernia without mention of obstruction or gangrene 05/13/2013  . Cellulitis 03/14/2013  . Draining postoperative wound 01/27/2013  . S/P colon resection 06/27/2012  . Obesity, Class III, BMI 40-49.9 (morbid obesity) 10/18/2011  . Diverticulitis of colon with perforation sigmoid colectomy and colostomy 1/15 10/09/2011  . Hypertension 10/09/2011  . Diabetes mellitus 10/09/2011  . OBSTRUCTIVE SLEEP APNEA 03/10/2008            Wound Therapy    Wound Properties Date First Assessed: 08/13/14 Time First Assessed: 1150 Wound Type: Other (Comment) Location: Leg Location Orientation: Left Present on Admission: Yes         Azucena Freed PT/CLT [phone]  08/23/2014, 12:25 PM

## 2014-08-24 ENCOUNTER — Telehealth (HOSPITAL_COMMUNITY): Payer: Self-pay | Admitting: Physical Therapy

## 2014-08-24 NOTE — Addendum Note (Signed)
Encounter addended by: Leeroy Cha, PT on: 08/24/2014  2:37 PM<BR>     Documentation filed: Letters

## 2014-08-26 ENCOUNTER — Ambulatory Visit (HOSPITAL_COMMUNITY)
Admission: RE | Admit: 2014-08-26 | Discharge: 2014-08-26 | Disposition: A | Discharge status: Home / Self Care | Payer: Medicare Other | Source: Ambulatory Visit | Attending: Family Medicine | Admitting: Family Medicine

## 2014-08-26 DIAGNOSIS — L03116 Cellulitis of left lower limb: Secondary | ICD-10-CM | POA: Diagnosis present

## 2014-08-26 DIAGNOSIS — M7989 Other specified soft tissue disorders: Secondary | ICD-10-CM | POA: Diagnosis not present

## 2014-08-26 DIAGNOSIS — L03115 Cellulitis of right lower limb: Secondary | ICD-10-CM

## 2014-08-26 NOTE — Therapy (Signed)
[facility] [address] [place], [place], [address] Phone: [phone]   Fax:  [phone]  Wound Care Therapy  Patient Details  Name: [name] MRN: [id number] Date of Birth: May 09, 1941  Encounter Date: 08/26/2014      PT End of Session - 08/26/14 1619    Visit Number 4   Number of Visits 6   Authorization Type united healthcare medicare    PT Start Time 1400   PT Stop Time 3536   PT Time Calculation (min) 45 min      Past Medical History  Diagnosis Date  . Hypertension   . Diabetes mellitus   . Arthritis   . Anemia   . Diverticulitis   . Shortness of breath     with exertion   . Sleep apnea     cpap at 10   . Bronchitis 05/2014    Past Surgical History  Procedure Laterality Date  . Appendectomy    . Back surgery    . Colon resection  10/09/2011    Procedure: COLON RESECTION;  Surgeon: Gayland Curry, MD;  Location: [place];  Service: General;  Laterality: N/A;  Colon Resection with colostomy  . Colostomy    . Jackson pratt    . Application of wound vac  wound vac  . Abdominal hysterectomy  1979  . Proctoscopy  06/26/2012    Procedure: PROCTOSCOPY;  Surgeon: Gayland Curry, MD,FACS;  [facility];  Service: General;  Laterality: N/A;  Rigid Proctoscopy  . Colon resection  06/26/2012    Procedure: COLON RESECTION;  Surgeon: Gayland Curry, MD,FACS;  [facility];  Service: General;  Laterality: N/A;  . Application of wound vac  06/26/2012    Procedure: APPLICATION OF WOUND VAC;  Surgeon: Gayland Curry, MD,FACS;  [facility];  Service: General;;    There were no vitals taken for this visit.  Visit Diagnosis:  Cellulitis of leg, left  Cellulitis of leg, right Pt denies leg pain but does have OA of knee and states pain is about a 3/10/   08/13/2014 08/13/2014 08/26/2014 08/26/2014   RT LT    MCP 21.60 21.80 21.9 22.3  ankle(smallest) 25 25.3 22.50 23.30  calf (largest) 43.60 42.00 35.30 42.00  knee  41.50 42.20 43.30  44.00                                                                                            Sum of squares 4714.77 4660.17 4106.84 4740.18  Total Volume 1500.7584 1443.1540 1307.2482 0867.6195         Wound Therapy - 08/26/14 1614    Subjective Pt states that the bandages stayed up better this time.  Pt states she has been contacted by [facility] and they are going to fit her for compression garment next week    Prior Treatments IV antibiotic    Evaluation and Treatment Procedures Explained to Patient/Family Yes   Evaluation and Treatment Procedures agreed to   Wound Properties Date First Assessed: 08/13/14 Time First Assessed: 1150 Wound Type: Other (Comment) Location: Leg Location Orientation: Left  Present on Admission: Yes   Dressing Type Compression wrap   Dressing Status Clean   Dressing Change Frequency --  once a week   % Wound base Red or Granulating 100%   Wound Length (cm) 0 cm   Wound Width (cm) 0 cm   Wound Depth (cm) 0 cm   Drainage Amount None   Treatment Cleansed;Other (Comment)  manual decongestive techniques. Including supraclavicular, deep and superficial abdominal, and anterior LE.(done while patient is sitting in chair).    Wound Therapy - Clinical Statement Pt has agreed to juxta lite compression as she feels she will be able to put them on with the help of her husband.  Pt Rt LE has lost 200 CC of fluid with Lt not losing any.  We will continue manual techniques and compression bandaging until pt receives the compression garment to reduce risk of cellulitis and decrease edema.     Wound Therapy - Functional Problem List swelling increases the difficulty of walking.    Wound Therapy - Frequency --  one time/week   Wound Therapy - Current Recommendations PT   Wound Plan Pt to recieve manual decongestive techniques to decrease edema followed by profore until pt recieves juxtalite garment.    Dressing  profore   Improve Drainage Characteristics  --  LTG:  4weeks:  Pt volume to be decrease by 20% B   Patient/Family will be able to   STG 1 week: verbalize the importance of compression on LE    Patient/Family Instruction Goal - Progress Met   Additional Wound Therapy Goal  LTG: 4 weeks Pt to state that she is able to walk with greater ease (20%)   Additional Wound Therapy Goal - Progress Progressing toward goal         Problem List Patient Active Problem List   Diagnosis Date Noted  . COPD exacerbation 06/05/2014  . Hypoxia 06/05/2014  . Iron deficiency anemia due to chronic blood loss 04/12/2014  . Postoperative MRSA infection of lower abdominal wound 03/22/2014  . Thrombocytosis 03/20/2014  . Incisional hernia without mention of obstruction or gangrene 05/13/2013  . Cellulitis 03/14/2013  . Draining postoperative wound 01/27/2013  . S/P colon resection 06/27/2012  . Obesity, Class III, BMI 40-49.9 (morbid obesity) 10/18/2011  . Diverticulitis of colon with perforation sigmoid colectomy and colostomy 1/15 10/09/2011  . Hypertension 10/09/2011  . Diabetes mellitus 10/09/2011  . OBSTRUCTIVE SLEEP APNEA 03/10/2008   Azucena Freed PT/CLT [phone]  08/26/2014, 4:21 PM

## 2014-08-31 ENCOUNTER — Ambulatory Visit: Payer: Medicare Other

## 2014-09-02 ENCOUNTER — Ambulatory Visit (HOSPITAL_COMMUNITY): Payer: Medicare Other | Admitting: Physical Therapy

## 2014-09-02 ENCOUNTER — Ambulatory Visit (HOSPITAL_COMMUNITY)
Admission: RE | Admit: 2014-09-02 | Discharge: 2014-09-02 | Disposition: A | Discharge status: Home / Self Care | Payer: Medicare Other | Source: Ambulatory Visit | Attending: Family Medicine | Admitting: Family Medicine

## 2014-09-02 DIAGNOSIS — L03115 Cellulitis of right lower limb: Secondary | ICD-10-CM

## 2014-09-02 DIAGNOSIS — L03116 Cellulitis of left lower limb: Secondary | ICD-10-CM

## 2014-09-02 NOTE — Therapy (Signed)
[facility] [address] [place], [place], [address] Phone: [phone]   Fax:  [phone]  Physical Therapy Treatment  Patient Details  Name: [name] MRN: [id number] Date of Birth: Dec 16, 1940  Encounter Date: 09/02/2014      PT End of Session - 09/02/14 1803    Visit Number 5   Number of Visits 6   Authorization Type united healthcare medicare    PT Start Time 1520   PT Stop Time 1600   PT Time Calculation (min) 40 min      Past Medical History  Diagnosis Date  . Hypertension   . Diabetes mellitus   . Arthritis   . Anemia   . Diverticulitis   . Shortness of breath     with exertion   . Sleep apnea     cpap at 10   . Bronchitis 05/2014    Past Surgical History  Procedure Laterality Date  . Appendectomy    . Back surgery    . Colon resection  10/09/2011    Procedure: COLON RESECTION;  Surgeon: Gayland Curry, MD;  Location: [place];  Service: General;  Laterality: N/A;  Colon Resection with colostomy  . Colostomy    . Jackson pratt    . Application of wound vac  wound vac  . Abdominal hysterectomy  1979  . Proctoscopy  06/26/2012    Procedure: PROCTOSCOPY;  Surgeon: Gayland Curry, MD,FACS;  [facility];  Service: General;  Laterality: N/A;  Rigid Proctoscopy  . Colon resection  06/26/2012    Procedure: COLON RESECTION;  Surgeon: Gayland Curry, MD,FACS;  [facility];  Service: General;  Laterality: N/A;  . Application of wound vac  06/26/2012    Procedure: APPLICATION OF WOUND VAC;  Surgeon: Gayland Curry, MD,FACS;  [facility];  Service: General;;    There were no vitals taken for this visit.  Visit Diagnosis:  Cellulitis of leg, left  Cellulitis of leg, right      Subjective Assessment - 09/02/14 1800    Symptoms Pt states the bandages get tight.  Pt states she never lays down, sits and sleeps in a chair.  Pt reports no pain currently.   Currently in Pain? No/denies            [facility] Adult PT  Treatment/Exercise - 09/02/14 0001    Manual Therapy   Manual Therapy Edema management   Edema Management retro massage from ankle to knee as well as lateral thigh. Followed by application of multilayer compression bandage system,(profore).          PT Education - 09/02/14 1801    Education provided Yes   Education Details How seated position worsens LE edema, lymphedema and compression role.    Person(s) Educated Patient;Spouse   Methods Explanation;Demonstration   Comprehension Verbalized understanding          PT Short Term Goals - 09/02/14 1802    PT SHORT TERM GOAL #1   Title Pt to verbalize the importance of using compression to manage edema   Time 2   Period Weeks   Status Achieved          PT Long Term Goals - 09/02/14 1803    PT LONG TERM GOAL #1   Title Pt to verbalize that it is 20% easier to walk    Time 4   Period Weeks   Status On-going   PT LONG TERM GOAL #2   Title  volume decreaed by 20%   Time 4   Period Weeks   Status Deferred  not measured          Plan - 09/02/14 1805    Clinical Impression Statement Pt with juxtafit thigh high compression garments on order.  Explained importance of increasing her walking and decreasing the amount of time spent seated as it interferes with inguinal nodes and lymph flow.  Discussed completing manual lymph drainage in supine or sidelying position, however patient refused to assume these positions.  Had to complete in seated position.  completed profore wrapping to bilateral LE's following manual lymph drainage.    PT Frequency --  increase to twice a week now that pt will wear compression    PT Next Visit Plan Re-evaluate next visit.          Problem List Patient Active Problem List   Diagnosis Date Noted  . COPD exacerbation 06/05/2014  . Hypoxia 06/05/2014  . Iron deficiency anemia due to chronic blood loss 04/12/2014  . Postoperative MRSA infection of lower abdominal wound 03/22/2014  .  Thrombocytosis 03/20/2014  . Incisional hernia without mention of obstruction or gangrene 05/13/2013  . Cellulitis 03/14/2013  . Draining postoperative wound 01/27/2013  . S/P colon resection 06/27/2012  . Obesity, Class III, BMI 40-49.9 (morbid obesity) 10/18/2011  . Diverticulitis of colon with perforation sigmoid colectomy and colostomy 1/15 10/09/2011  . Hypertension 10/09/2011  . Diabetes mellitus 10/09/2011  . OBSTRUCTIVE SLEEP APNEA 03/10/2008    Teena Irani, PTA/CLT [phone] 09/02/2014, 6:10 PM

## 2014-09-06 ENCOUNTER — Ambulatory Visit (HOSPITAL_COMMUNITY)
Admission: RE | Admit: 2014-09-06 | Discharge: 2014-09-06 | Disposition: A | Discharge status: Home / Self Care | Payer: Medicare Other | Source: Ambulatory Visit | Attending: Family Medicine | Admitting: Family Medicine

## 2014-09-06 DIAGNOSIS — L03115 Cellulitis of right lower limb: Secondary | ICD-10-CM

## 2014-09-06 DIAGNOSIS — L03116 Cellulitis of left lower limb: Secondary | ICD-10-CM

## 2014-09-06 DIAGNOSIS — R609 Edema, unspecified: Secondary | ICD-10-CM

## 2014-09-06 NOTE — Therapy (Signed)
[facility] [address] [place], [place], [address] Phone: [phone]   Fax:  [phone]  Wound Care Therapy  Patient Details  Name: [name] MRN: [id number] Date of Birth: 1941-03-31  Encounter Date: 09/06/2014    Past Medical History  Diagnosis Date  . Hypertension   . Diabetes mellitus   . Arthritis   . Anemia   . Diverticulitis   . Shortness of breath     with exertion   . Sleep apnea     cpap at 10   . Bronchitis 05/2014    Past Surgical History  Procedure Laterality Date  . Appendectomy    . Back surgery    . Colon resection  10/09/2011    Procedure: COLON RESECTION;  Surgeon: Gayland Curry, MD;  Location: [place];  Service: General;  Laterality: N/A;  Colon Resection with colostomy  . Colostomy    . Jackson pratt    . Application of wound vac  wound vac  . Abdominal hysterectomy  1979  . Proctoscopy  06/26/2012    Procedure: PROCTOSCOPY;  Surgeon: Gayland Curry, MD,FACS;  [facility];  Service: General;  Laterality: N/A;  Rigid Proctoscopy  . Colon resection  06/26/2012    Procedure: COLON RESECTION;  Surgeon: Gayland Curry, MD,FACS;  [facility];  Service: General;  Laterality: N/A;  . Application of wound vac  06/26/2012    Procedure: APPLICATION OF WOUND VAC;  Surgeon: Gayland Curry, MD,FACS;  [facility];  Service: General;;    There were no vitals taken for this visit.  Visit Diagnosis:  Cellulitis of leg, left  Cellulitis of leg, right  Edema   08/13/2014 08/13/2014 08/26/2014 08/26/2014 09/06/2014 11/07/2013  RT LT   RT LT  21.60 21.80 21.9 22.3 22.3 22.3  25 25.3 22.50 23.30 23.20 23.20  43.60 42.00 35.30 42.00 33.80 37.00  41.50 42.20 43.30 44.00 44.00 44.50                                                                                                           4714.77 4660.17 4106.84 4740.18 4113.97 4384.78  1500.7584 1483.3787 1307.2482 5176.1607 1309.5178 1395.7193          Wound Therapy - 09/06/14 1440    Subjective Pt states the bandages get tight.  States she is suppose to aquire her juxtalite this weelk   Prior Treatments IV antibiotic    Evaluation and Treatment Procedures Explained to Patient/Family Yes   Evaluation and Treatment Procedures agreed to   Wound Properties Date First Assessed: 08/13/14 Time First Assessed: 1150 Wound Type: Other (Comment) Location: Leg Location Orientation: Left Present on Admission: Yes   Dressing Type Compression wrap   Dressing Status Clean   Dressing Change Frequency --  once a week   % Wound base Red or Granulating 100%   Wound Length (cm) 0 cm   Wound Width (cm) 0 cm   Wound Depth (cm) 0 cm   Drainage Amount None   Treatment Cleansed;Other (Comment)  manual decongestive techniques to decrease edema    Wound Therapy - Clinical Statement Pt LE edema has decreased significantly.  Pt dressed with Profore lite instead of profore to improve pt comfort since edema is now in the control state and not in decreasing.  Pt has had a decrease of 190 cc of fluid from the Rt LE and 88 cc of fluid from the LT>    Wound Therapy - Functional Problem List swelling increases the difficulty of walking.    Wound Therapy - Frequency --  one time/week   Wound Therapy - Current Recommendations PT   Wound Plan Pt to recieve manual decongestive techniques to decrease edema followed by profore until pt recieves juxtalite garment.    Dressing  profore   Manual Therapy manual decongestive; supraclavicular, deep and superficial abdominal as well as routuing fluid using anterior inguinal/axillary anastomosisi.    Improve Drainage Characteristics --  LTG:  4weeks:  Pt volume to be decrease by 20% B   Patient/Family will be able to   STG 1 week: verbalize the importance of compression on LE    Patient/Family Instruction Goal - Progress Met   Additional Wound Therapy Goal  LTG: 4 weeks Pt to state that she is able to walk with greater ease (20%)   NEW Goal as of 09/06/2014-pt with husband assist to be able to don juxtalite on LE =not met    Additional Wound Therapy Goal - Progress Met     Pt instructed that if she and her husband feel comfortable donning and doffing juxtalite after she receives them she can cancel her appointments.  If she feels that they need more one on one instruction she can come Thursday for self care in donning and doffing juxtalite.    Problem List Patient Active Problem List   Diagnosis Date Noted  . COPD exacerbation 06/05/2014  . Hypoxia 06/05/2014  . Iron deficiency anemia due to chronic blood loss 04/12/2014  . Postoperative MRSA infection of lower abdominal wound 03/22/2014  . Thrombocytosis 03/20/2014  . Incisional hernia without mention of obstruction or gangrene 05/13/2013  . Cellulitis 03/14/2013  . Draining postoperative wound 01/27/2013  . S/P colon resection 06/27/2012  . Obesity, Class III, BMI 40-49.9 (morbid obesity) 10/18/2011  . Diverticulitis of colon with perforation sigmoid colectomy and colostomy 1/15 10/09/2011  . Hypertension 10/09/2011  . Diabetes mellitus 10/09/2011  . OBSTRUCTIVE SLEEP APNEA 03/10/2008  Azucena Freed PT/CLT [phone]  09/06/2014, 2:47 PM

## 2014-09-08 ENCOUNTER — Ambulatory Visit (HOSPITAL_COMMUNITY)
Admission: RE | Admit: 2014-09-08 | Discharge: 2014-09-08 | Disposition: A | Discharge status: Home / Self Care | Payer: Medicare Other | Source: Ambulatory Visit | Attending: Family Medicine | Admitting: Family Medicine

## 2014-09-08 DIAGNOSIS — L03116 Cellulitis of left lower limb: Secondary | ICD-10-CM | POA: Diagnosis not present

## 2014-09-08 DIAGNOSIS — R609 Edema, unspecified: Secondary | ICD-10-CM

## 2014-09-08 DIAGNOSIS — L03115 Cellulitis of right lower limb: Secondary | ICD-10-CM

## 2014-09-08 NOTE — Therapy (Addendum)
[facility] [address] [place], [place], [address] Phone: [phone]   Fax:  [phone]  Physical Therapy Treatment  Patient Details  Name: [name] MRN: [id number] Date of Birth: August 15, 1941  Encounter Date: 09/08/2014      PT End of Session - 09/08/14 1657    Visit Number 7   Number of Visits 10   Authorization Type united healthcare medicare    Authorization - Visit Number 7   Authorization - Number of Visits 10   PT Start Time 1300   PT Stop Time 1345   PT Time Calculation (min) 45 min      Past Medical History  Diagnosis Date  . Hypertension   . Diabetes mellitus   . Arthritis   . Anemia   . Diverticulitis   . Shortness of breath     with exertion   . Sleep apnea     cpap at 10   . Bronchitis 05/2014    Past Surgical History  Procedure Laterality Date  . Appendectomy    . Back surgery    . Colon resection  10/09/2011    Procedure: COLON RESECTION;  Surgeon: Gayland Curry, MD;  Location: [place];  Service: General;  Laterality: N/A;  Colon Resection with colostomy  . Colostomy    . Jackson pratt    . Application of wound vac  wound vac  . Abdominal hysterectomy  1979  . Proctoscopy  06/26/2012    Procedure: PROCTOSCOPY;  Surgeon: Gayland Curry, MD,FACS;  [facility];  Service: General;  Laterality: N/A;  Rigid Proctoscopy  . Colon resection  06/26/2012    Procedure: COLON RESECTION;  Surgeon: Gayland Curry, MD,FACS;  [facility];  Service: General;  Laterality: N/A;  . Application of wound vac  06/26/2012    Procedure: APPLICATION OF WOUND VAC;  Surgeon: Gayland Curry, MD,FACS;  [facility];  Service: General;;    There were no vitals taken for this visit.  Visit Diagnosis:  No diagnosis found.      Subjective Assessment - 09/08/14 1655    Symptoms Pt states she still has not received her juxtaFIT garments.  States she hopes to get them by Christmas.  Currently without pain, however bandages have  slipped down slightly bilatearlly.   Currently in Pain? No/denies            [facility] Adult PT Treatment/Exercise - 09/08/14 1654    Manual Therapy   Manual Therapy Edema management   Edema Management Manual lymph drainage for bilateral LE's in seated position followed by application of profore lite MT to knee bilaterally                Plan - 09/08/14 1657    Clinical Impression Statement Pt with swelling above line of profore 2 dressings and noted swelling in her toes.  Mentioned to patient possiblity of bandaging toes if continue to swell.  Pt once again encouraged to walk and get out of chair as much as possible.  Continued with profore lite dressings for compression.   PT Frequency --  increase to twice a week now that pt will wear compression    PT Next Visit Plan Continue MLD and compression bandaging 1X week until patient receives her juxtaFIT garments.        [id number] CI [id number] CJ        Problem List Patient Active Problem List   Diagnosis Date Noted  . COPD  exacerbation 06/05/2014  . Hypoxia 06/05/2014  . Iron deficiency anemia due to chronic blood loss 04/12/2014  . Postoperative MRSA infection of lower abdominal wound 03/22/2014  . Thrombocytosis 03/20/2014  . Incisional hernia without mention of obstruction or gangrene 05/13/2013  . Cellulitis 03/14/2013  . Draining postoperative wound 01/27/2013  . S/P colon resection 06/27/2012  . Obesity, Class III, BMI 40-49.9 (morbid obesity) 10/18/2011  . Diverticulitis of colon with perforation sigmoid colectomy and colostomy 1/15 10/09/2011  . Hypertension 10/09/2011  . Diabetes mellitus 10/09/2011  . OBSTRUCTIVE SLEEP APNEA 03/10/2008    Teena Irani, PTA/CLT [phone] 09/08/2014, 5:01 PM   PHYSICAL THERAPY DISCHARGE SUMMARY  Visits from Start of Care: 7  Current functional level related to goals / functional outcomes: Unknown pt did not return for appointment   Remaining deficits: unknown    Education / Equipment: Education on lymphedema  Plan: Patient agrees to discharge.  Patient goals were partially met. Patient is being discharged due to not returning since the last visit.  ?????     Rayetta Humphrey, [place] CLT [phone]

## 2014-09-10 ENCOUNTER — Telehealth (HOSPITAL_COMMUNITY): Payer: Self-pay | Admitting: Physical Therapy

## 2014-09-10 NOTE — Telephone Encounter (Signed)
Patient has rec'd her garments and will not be returning to PT

## 2014-09-14 ENCOUNTER — Ambulatory Visit (HOSPITAL_COMMUNITY): Payer: Medicare Other | Admitting: Physical Therapy

## 2014-10-21 ENCOUNTER — Other Ambulatory Visit (HOSPITAL_COMMUNITY): Payer: Self-pay

## 2014-10-21 DIAGNOSIS — D500 Iron deficiency anemia secondary to blood loss (chronic): Secondary | ICD-10-CM

## 2014-10-22 ENCOUNTER — Encounter (HOSPITAL_COMMUNITY): Payer: Self-pay | Admitting: Hematology & Oncology

## 2014-10-22 ENCOUNTER — Encounter (HOSPITAL_BASED_OUTPATIENT_CLINIC_OR_DEPARTMENT_OTHER): Payer: 59

## 2014-10-22 ENCOUNTER — Encounter (HOSPITAL_COMMUNITY): Payer: 59 | Attending: Hematology & Oncology | Admitting: Hematology & Oncology

## 2014-10-22 VITALS — BP 140/67 | HR 77 | Temp 97.9°F | Resp 20

## 2014-10-22 DIAGNOSIS — D500 Iron deficiency anemia secondary to blood loss (chronic): Secondary | ICD-10-CM

## 2014-10-22 DIAGNOSIS — D72829 Elevated white blood cell count, unspecified: Secondary | ICD-10-CM

## 2014-10-22 DIAGNOSIS — Z8614 Personal history of Methicillin resistant Staphylococcus aureus infection: Secondary | ICD-10-CM | POA: Insufficient documentation

## 2014-10-22 DIAGNOSIS — Z87891 Personal history of nicotine dependence: Secondary | ICD-10-CM | POA: Diagnosis not present

## 2014-10-22 LAB — CBC WITH DIFFERENTIAL/PLATELET
BASOS PCT: 0 % (ref 0–1)
Basophils Absolute: 0.1 10*3/uL (ref 0.0–0.1)
EOS PCT: 4 % (ref 0–5)
Eosinophils Absolute: 0.5 10*3/uL (ref 0.0–0.7)
HCT: 38.6 % (ref 36.0–46.0)
HEMOGLOBIN: 10.8 g/dL — AB (ref 12.0–15.0)
LYMPHS PCT: 25 % (ref 12–46)
Lymphs Abs: 3.1 10*3/uL (ref 0.7–4.0)
MCH: 20.9 pg — ABNORMAL LOW (ref 26.0–34.0)
MCHC: 28 g/dL — ABNORMAL LOW (ref 30.0–36.0)
MCV: 74.8 fL — ABNORMAL LOW (ref 78.0–100.0)
MONOS PCT: 7 % (ref 3–12)
Monocytes Absolute: 0.9 10*3/uL (ref 0.1–1.0)
Neutro Abs: 8 10*3/uL — ABNORMAL HIGH (ref 1.7–7.7)
Neutrophils Relative %: 64 % (ref 43–77)
PLATELETS: 393 10*3/uL (ref 150–400)
RBC: 5.16 MIL/uL — ABNORMAL HIGH (ref 3.87–5.11)
RDW: 17.7 % — ABNORMAL HIGH (ref 11.5–15.5)
WBC: 12.4 10*3/uL — ABNORMAL HIGH (ref 4.0–10.5)

## 2014-10-22 LAB — FERRITIN: FERRITIN: 12 ng/mL (ref 10–291)

## 2014-10-22 NOTE — Progress Notes (Signed)
LABS FOR [facility],FERR  

## 2014-10-22 NOTE — Patient Instructions (Signed)
[place] at [facility]  Discharge Instructions:  Please call prior to your next follow-up with any problems or concerns We will bring you in for iron starting next Friday, we will let you know how much you will need _______________________________________________________________  Thank you for choosing [place] at [facility] to provide your oncology and hematology care.  To afford each patient quality time with our providers, please arrive at least 15 minutes before your scheduled appointment.  You need to re-schedule your appointment if you arrive 10 or more minutes late.  We strive to give you quality time with our providers, and arriving late affects you and other patients whose appointments are after yours.  Also, if you no show three or more times for appointments you may be dismissed from the clinic.  Again, thank you for choosing [place] at [place] hope is that these requests will allow you access to exceptional care and in a timely manner. _______________________________________________________________  If you have questions after your visit, please contact our office at [phone] between the hours of 8:30 a.m. and 5:00 p.m. Voicemails left after 4:30 p.m. will not be returned until the following business day. _______________________________________________________________  For prescription refill requests, have your pharmacy contact our office. _______________________________________________________________  Recommendations made by the consultant and any test results will be sent to your referring physician. _______________________________________________________________

## 2014-10-22 NOTE — Progress Notes (Signed)
[name], MD [place] [address]    DIAGNOSIS:  Iron deficiency anemia due to chronic blood loss Ferritin on 03/22/2014 at 11 ng/ml Colonoscopy 09/07/2009 with severe diverticulosis in the sigmoid to descending, sessile polyp  Postoperative MRSA infection of lower abdominal wound, sequela Exploratory laparotomy, sigmoid colectomy with end colostomy with Dr. [name], secondary to a perforated sigmoid diverticulitis.  OBSTRUCTIVE SLEEP APNEA  Thrombocytosis, reactive   CURRENT THERAPY: Faraheme IV last given 04/19/2014  INTERVAL HISTORY: [name] 74 y.o. female returns for follow-up of her iron deficiency.  She states she was never afraid of colonoscopies before but she is now because of problems she had after a surgery in 2013.  She denies any BRBPR or black, tarry stool. No hematuria.   MEDICAL HISTORY: Past Medical History  Diagnosis Date  . Hypertension   . Diabetes mellitus   . Arthritis   . Anemia   . Diverticulitis   . Shortness of breath     with exertion   . Sleep apnea     cpap at 10   . Bronchitis 05/2014  . Cellulitis of lower extremity 06/2014    bilateral    has OBSTRUCTIVE SLEEP APNEA; Diverticulitis of colon with perforation sigmoid colectomy and colostomy 1/15; Hypertension; Diabetes mellitus; Obesity, Class III, BMI 40-49.9 (morbid obesity); S/P colon resection; Draining postoperative wound; Cellulitis; Incisional hernia without mention of obstruction or gangrene; Thrombocytosis; Postoperative MRSA infection of lower abdominal wound; Iron deficiency anemia due to chronic blood loss; COPD exacerbation; Hypoxia; and Leukocytosis on her problem list.     is allergic to adhesive; vancomycin; and zosyn.  [name] does not currently have medications on file.  SURGICAL HISTORY: Past Surgical History  Procedure Laterality Date  . Appendectomy    . Back surgery    . Colon resection  10/09/2011    Procedure: COLON  RESECTION;  Surgeon: Gayland Curry, MD;  Location: [facility];  Service: General;  Laterality: N/A;  Colon Resection with colostomy  . Colostomy    . Jackson pratt    . Application of wound vac  wound vac  . Abdominal hysterectomy  1979  . Proctoscopy  06/26/2012    Procedure: PROCTOSCOPY;  Surgeon: Gayland Curry, MD,FACS;  Location: [facility];  Service: General;  Laterality: N/A;  Rigid Proctoscopy  . Colon resection  06/26/2012    Procedure: COLON RESECTION;  Surgeon: Gayland Curry, MD,FACS;  Location: [facility];  Service: General;  Laterality: N/A;  . Application of wound vac  06/26/2012    Procedure: APPLICATION OF WOUND VAC;  Surgeon: Gayland Curry, MD,FACS;  Location: [facility];  Service: General;;    SOCIAL HISTORY: History   Social History  . Marital Status: Married    Spouse Name: N/A  . Number of Children: N/A  . Years of Education: N/A   Occupational History  . Not on file.   Social History Main Topics  . Smoking status: Former Smoker    Quit date: 09/25/2011  . Smokeless tobacco: Never Used  . Alcohol Use: No  . Drug Use: No  . Sexual Activity: Not Currently    Birth Control/ Protection: Surgical   Other Topics Concern  . Not on file   Social History Narrative  No children   FAMILY HISTORY: Family History  Problem Relation Age of Onset  . Heart disease Father   . Diabetes Father   . Hypertension Father   . Heart disease  Brother   . Diabetes Brother   . Diabetes Mother   . Hypertension Mother   . Diabetes Sister     Review of Systems  Constitutional: Negative.   HENT: Negative.   Eyes:       Wears glasses  Respiratory: Negative.        SOB with exertion  Cardiovascular: Negative.   Gastrointestinal: Negative.        Takes miralax and "metamucil" supplement daily  Genitourinary: Negative.        Nocturia  Musculoskeletal:       Bad right knee Uses walker at home  Skin: Negative.   Neurological: Negative.   Endo/Heme/Allergies: Negative.     Psychiatric/Behavioral: Negative.     PHYSICAL EXAMINATION  ECOG PERFORMANCE STATUS: 1 - Symptomatic but completely ambulatory  Filed Vitals:   10/22/14 1336  BP: 140/67  Pulse: 77  Temp: 97.9 F (36.6 C)  Resp: 20    Physical Exam  Constitutional: She is oriented to person, place, and time and well-developed, well-nourished, and in no distress.  HENT:  Head: Normocephalic and atraumatic.  Nose: Nose normal.  Mouth/Throat: Oropharynx is clear and moist. No oropharyngeal exudate.  Eyes: Conjunctivae and EOM are normal. Pupils are equal, round, and reactive to light. Right eye exhibits no discharge. Left eye exhibits no discharge. No scleral icterus.  Neck: Normal range of motion. Neck supple. No tracheal deviation present. No thyromegaly present.  Cardiovascular: Normal rate, regular rhythm and normal heart sounds.  Exam reveals no gallop and no friction rub.   No murmur heard. Pulmonary/Chest: Effort normal and breath sounds normal. She has no wheezes. She has no rales.  Abdominal: Soft. Bowel sounds are normal. She exhibits no distension and no mass. There is no tenderness. There is no rebound and no guarding.  Musculoskeletal: Normal range of motion. She exhibits no edema.  Mild limp in gait secondary to "bad knee"  Lymphadenopathy:    She has no cervical adenopathy.  Neurological: She is alert and oriented to person, place, and time. She has normal reflexes. No cranial nerve deficit. Gait normal. Coordination normal.  Skin: Skin is warm and dry. No rash noted.  Psychiatric: Mood, memory, affect and judgment normal.  Nursing note and vitals reviewed.   LABORATORY DATA:  CBC    Component Value Date/Time   WBC 12.4* 10/22/2014 1317   RBC 5.16* 10/22/2014 1317   HGB 10.8* 10/22/2014 1317   HCT 38.6 10/22/2014 1317   PLT 393 10/22/2014 1317   MCV 74.8* 10/22/2014 1317   MCH 20.9* 10/22/2014 1317   MCHC 28.0* 10/22/2014 1317   RDW 17.7* 10/22/2014 1317   LYMPHSABS  3.1 10/22/2014 1317   MONOABS 0.9 10/22/2014 1317   EOSABS 0.5 10/22/2014 1317   BASOSABS 0.1 10/22/2014 1317   CMP     Component Value Date/Time   NA 140 06/25/2014 1450   K 4.6 06/25/2014 1450   CL 98 06/25/2014 1450   CO2 28 06/25/2014 1450   GLUCOSE 236* 06/25/2014 1450   BUN 16 06/25/2014 1450   CREATININE 0.89 06/25/2014 1450   CALCIUM 8.8 06/25/2014 1450   PROT 6.9 06/25/2014 1450   ALBUMIN 3.1* 06/25/2014 1450   AST 22 06/25/2014 1450   ALT 57* 06/25/2014 1450   ALKPHOS 94 06/25/2014 1450   BILITOT 0.5 06/25/2014 1450   GFRNONAA 62* 06/25/2014 1450   GFRAA 72* 06/25/2014 1450     ASSESSMENT and THERAPY PLAN:    Iron deficiency anemia due to  chronic blood loss Pleasant 74 year old female with recurrent and persistent iron deficiency anemia. Serum ferritin today is quite low, she has microcytosis and anemia. I have recommended additional iron replacement and she is agreeable. I will calculate her total iron deficit and we will set her up for IV iron replacement as soon as she is able. She is willing to consider going back to gastroenterology for consideration of additional evaluation of her recurrent iron deficiency anemia.   Leukocytosis She has persistent leukocytosis. I discussed with her today obtaining flow cytometry on her peripheral blood to rule out the possibility of any monoclonal white cell population. I spent time explaining the rationale behind this test to the patient. She agrees with the test and therefore we will make sure we add this on to her next visit as well.  It has been sometime since she has had a O12 and folic acid level checked. Given ongoing problems with anemia I recommended that adding them on to her next lab as well.   All questions were answered. The patient knows to call the clinic with any problems, questions or concerns. We can certainly see the patient much sooner if necessary.  Molli Hazard 11/06/2014

## 2014-10-23 ENCOUNTER — Other Ambulatory Visit (HOSPITAL_COMMUNITY): Payer: Self-pay | Admitting: Oncology

## 2014-10-23 DIAGNOSIS — D500 Iron deficiency anemia secondary to blood loss (chronic): Secondary | ICD-10-CM

## 2014-10-25 ENCOUNTER — Other Ambulatory Visit (HOSPITAL_COMMUNITY): Payer: Self-pay | Admitting: Hematology & Oncology

## 2014-10-26 ENCOUNTER — Encounter: Payer: Self-pay | Admitting: Internal Medicine

## 2014-10-27 ENCOUNTER — Encounter (HOSPITAL_COMMUNITY): Payer: 59 | Attending: Hematology & Oncology

## 2014-10-27 ENCOUNTER — Encounter (HOSPITAL_BASED_OUTPATIENT_CLINIC_OR_DEPARTMENT_OTHER): Payer: 59

## 2014-10-27 DIAGNOSIS — D500 Iron deficiency anemia secondary to blood loss (chronic): Secondary | ICD-10-CM | POA: Diagnosis not present

## 2014-10-27 DIAGNOSIS — Z8614 Personal history of Methicillin resistant Staphylococcus aureus infection: Secondary | ICD-10-CM | POA: Diagnosis not present

## 2014-10-27 DIAGNOSIS — Z87891 Personal history of nicotine dependence: Secondary | ICD-10-CM | POA: Diagnosis not present

## 2014-10-27 MED ORDER — SODIUM CHLORIDE 0.9 % IV SOLN
510.0000 mg | Freq: Once | INTRAVENOUS | Status: AC
  Administered 2014-10-27: 510 mg via INTRAVENOUS
  Filled 2014-10-27: qty 17

## 2014-10-27 MED ORDER — SODIUM CHLORIDE 0.9 % IV SOLN
INTRAVENOUS | Status: DC
  Administered 2014-10-27: 14:00:00 via INTRAVENOUS

## 2014-10-27 NOTE — Progress Notes (Signed)
Tolerated iron infusion well. 

## 2014-10-27 NOTE — Patient Instructions (Signed)
[place] at [facility] Discharge Instructions  RECOMMENDATIONS MADE BY THE CONSULTANT AND ANY TEST RESULTS WILL BE SENT TO YOUR REFERRING PHYSICIAN.  Today you received Feraheme 510mg .   Please call us if you have any problems or questions.    Thank you for choosing [place] at [facility] to provide your oncology and hematology care.  To afford each patient quality time with our provider, please arrive at least 15 minutes before your scheduled appointment time.    You need to re-schedule your appointment should you arrive 10 or more minutes late.  We strive to give you quality time with our providers, and arriving late affects you and other patients whose appointments are after yours.  Also, if you no show three or more times for appointments you may be dismissed from the clinic at the providers discretion.     Again, thank you for choosing [facility].  Our hope is that these requests will decrease the amount of time that you wait before being seen by our physicians.       _____________________________________________________________  Should you have questions after your visit to [facility], please contact our office at [phone] between the hours of 8:30 a.m. and 4:30 p.m.  Voicemails left after 4:30 p.m. will not be returned until the following business day.  For prescription refill requests, have your pharmacy contact our office.

## 2014-10-27 NOTE — Progress Notes (Signed)
[name] presented for [employer]. Labs per MD order drawn via Peripheral Line 23 gauge needle inserted in right antecubital. Good blood return present. Procedure without incident. Needle removed intact. Patient tolerated procedure well.

## 2014-10-28 ENCOUNTER — Telehealth (HOSPITAL_COMMUNITY): Payer: Self-pay | Admitting: *Deleted

## 2014-10-29 ENCOUNTER — Telehealth (HOSPITAL_COMMUNITY): Payer: Self-pay

## 2014-10-29 NOTE — Telephone Encounter (Signed)
Patient called 10/28/14 requesting to speak to the MD.  Dr.Penland requested that nursing staff return the call to the patient.  Call was returned and the patient stated she wanted the doctor to know that her "incision ruptured open yesterday".  Draining yellow and she vomited x3 last night, and has just not felt well today. Patient has recurrent wound to the ab. Area.  The patient stated she had notified her surgeon (Dr.Wilson) and he wanted to see her today but she did not feel "well enough" to see him today.  He was prescribing an antibiotic today for her to take.  Patient stated she just wanted to make our doctor aware.

## 2014-11-03 ENCOUNTER — Encounter (HOSPITAL_BASED_OUTPATIENT_CLINIC_OR_DEPARTMENT_OTHER): Payer: 59

## 2014-11-03 ENCOUNTER — Encounter (HOSPITAL_COMMUNITY): Payer: Self-pay

## 2014-11-03 DIAGNOSIS — D500 Iron deficiency anemia secondary to blood loss (chronic): Secondary | ICD-10-CM

## 2014-11-03 MED ORDER — SODIUM CHLORIDE 0.9 % IV SOLN
INTRAVENOUS | Status: DC
  Administered 2014-11-03: 14:00:00 via INTRAVENOUS

## 2014-11-03 MED ORDER — SODIUM CHLORIDE 0.9 % IV SOLN
510.0000 mg | Freq: Once | INTRAVENOUS | Status: AC
  Administered 2014-11-03: 510 mg via INTRAVENOUS
  Filled 2014-11-03: qty 17

## 2014-11-03 NOTE — Progress Notes (Signed)
Patient tolerated infusion well.

## 2014-11-03 NOTE — Patient Instructions (Signed)
[facility] at [facility]  Discharge Instructions:  You had an iron transfusion today.  Follow up as scheduled.  Call the clinic if you have any questions or concerns _______________________________________________________________  Thank you for choosing [facility] at [facility] to provide your oncology and hematology care.  To afford each patient quality time with our providers, please arrive at least 15 minutes before your scheduled appointment.  You need to re-schedule your appointment if you arrive 10 or more minutes late.  We strive to give you quality time with our providers, and arriving late affects you and other patients whose appointments are after yours.  Also, if you no show three or more times for appointments you may be dismissed from the clinic.  Again, thank you for choosing [facility] at [facility]. Our hope is that these requests will allow you access to exceptional care and in a timely manner. _______________________________________________________________  If you have questions after your visit, please contact our office at [phone] between the hours of 8:30 a.m. and 5:00 p.m. Voicemails left after 4:30 p.m. will not be returned until the following business day. _______________________________________________________________  For prescription refill requests, have your pharmacy contact our office. _______________________________________________________________  Recommendations made by the consultant and any test results will be sent to your referring physician. _______________________________________________________________ 

## 2014-11-06 DIAGNOSIS — D72829 Elevated white blood cell count, unspecified: Secondary | ICD-10-CM | POA: Insufficient documentation

## 2014-11-06 NOTE — Assessment & Plan Note (Signed)
Pleasant 74 year old female with recurrent and persistent iron deficiency anemia. Serum ferritin today is quite low, she has microcytosis and anemia. I have recommended additional iron replacement and she is agreeable. I will calculate her total iron deficit and we will set her up for IV iron replacement as soon as she is able. She is willing to consider going back to gastroenterology for consideration of additional evaluation of her recurrent iron deficiency anemia.

## 2014-11-06 NOTE — Assessment & Plan Note (Signed)
She has persistent leukocytosis. I discussed with her today obtaining flow cytometry on her peripheral blood to rule out the possibility of any monoclonal white cell population. I spent time explaining the rationale behind this test to the patient. She agrees with the test and therefore we will make sure we add this on to her next visit as well.  It has been sometime since she has had a R15 and folic acid level checked. Given ongoing problems with anemia I recommended that adding them on to her next lab as well.

## 2014-12-10 ENCOUNTER — Encounter: Payer: Self-pay | Admitting: *Deleted

## 2014-12-16 ENCOUNTER — Ambulatory Visit: Payer: Medicare Other | Admitting: Internal Medicine

## 2014-12-27 ENCOUNTER — Other Ambulatory Visit (INDEPENDENT_AMBULATORY_CARE_PROVIDER_SITE_OTHER): Payer: Medicare Other

## 2014-12-27 ENCOUNTER — Encounter: Payer: Self-pay | Admitting: Internal Medicine

## 2014-12-27 ENCOUNTER — Ambulatory Visit (INDEPENDENT_AMBULATORY_CARE_PROVIDER_SITE_OTHER): Payer: Medicare Other | Admitting: Internal Medicine

## 2014-12-27 VITALS — BP 126/64 | HR 76 | Ht 65.0 in | Wt 325.0 lb

## 2014-12-27 DIAGNOSIS — D509 Iron deficiency anemia, unspecified: Secondary | ICD-10-CM | POA: Diagnosis not present

## 2014-12-27 DIAGNOSIS — Z1211 Encounter for screening for malignant neoplasm of colon: Secondary | ICD-10-CM | POA: Diagnosis not present

## 2014-12-27 LAB — IGA: IGA: 270 mg/dL (ref 68–378)

## 2014-12-27 NOTE — Progress Notes (Signed)
Patient ID: [name], female   DOB: [birth year], 74 y.o.   MRN: [id number] HPI: [name] is a 74 year old female with past medical history of adenomatous colon polyps, diverticulitis status post perforation with sigmoid resection and colostomy formation status post colostomy takedown with reanastomosis 2013, obesity, sleep apnea, hypertension and diabetes who is seen in consultation at the request of Dr. Hilma Favors for iron deficiency anemia. She is also been seen by hematology and received IV iron on 2 separate occasions. She reports a somewhat long-standing history of iron deficiency and recalls prior to developing diverticulitis which became complicated she was put on oral iron which causes severe constipation. She denies current GI complaint but was recently told by primary care that her iron levels were low as were her blood counts. This led to repeat GI evaluation.  She denies abdominal pain. Reports good appetite. No dysphagia or odynophagia. No nausea or vomiting. No early satiety. She reports bowel movements have been regular for her without blood or melena in her stool. She denies issues with diarrhea or constipation. She usually uses MiraLAX on most days to avoid constipation. She says she was told to avoid constipation after her colon surgery.  She does occasionally have drainage from her midline surgical scar after colon surgery. This is intermittent. She reports the colonic surgery was very difficult for her to recover from and she would like to avoid repeat colonoscopy if at all possible.  Past Medical History  Diagnosis Date  . Hypertension   . Diabetes mellitus   . Arthritis   . Anemia   . Diverticulitis   . Shortness of breath     with exertion   . Sleep apnea     cpap at 10   . Bronchitis 05/2014  . Cellulitis of lower extremity 06/2014    bilateral  . Tubular adenoma of colon   . Gallstone   . MRSA (methicillin resistant Staphylococcus aureus)   . COPD (chronic obstructive  pulmonary disease)   . Obesity     Past Surgical History  Procedure Laterality Date  . Appendectomy    . Back surgery    . Colon resection  10/09/2011    Procedure: COLON RESECTION;  Surgeon: Gayland Curry, MD;  Location: [facility];  Service: General;  Laterality: N/A;  Colon Resection with colostomy  . Jackson pratt    . Application of wound vac  wound vac  . Abdominal hysterectomy  1979  . Proctoscopy  06/26/2012    Procedure: PROCTOSCOPY;  Surgeon: Gayland Curry, MD,FACS;  Location: [facility];  Service: General;  Laterality: N/A;  Rigid Proctoscopy  . Colon resection  06/26/2012    Procedure: COLON RESECTION;  Surgeon: Gayland Curry, MD,FACS;  Location: [facility];  Service: General;  Laterality: N/A;  . Application of wound vac  06/26/2012    Procedure: APPLICATION OF WOUND VAC;  Surgeon: Gayland Curry, MD,FACS;  Location: [facility];  Service: General;;  . Abdominal hysterectomy  1978    Outpatient Prescriptions Prior to Visit  Medication Sig Dispense Refill  . albuterol (PROVENTIL HFA;VENTOLIN HFA) 108 (90 BASE) MCG/ACT inhaler Inhale 2 puffs into the lungs every 4 (four) hours as needed for wheezing. 1 Inhaler 3  . aspirin 81 MG tablet Take 162 mg by mouth daily. Take 2 tablets, by mouth, every day    . Calcium Carb-Cholecalciferol (CALCIUM 600 + D PO) Take 2 tablets by mouth daily.    . fluticasone (FLOVENT HFA) 110 MCG/ACT inhaler  Inhale 2 puffs into the lungs 2 (two) times daily. (Patient taking differently: Inhale 2 puffs into the lungs as needed. ) 1 Inhaler 12  . Glucosamine-Chondroitin-Vit D3 1500-1200-800 MG-MG-UNIT PACK Take 2 tablets by mouth daily.    Marland Kitchen guaiFENesin-dextromethorphan (ROBITUSSIN DM) 100-10 MG/5ML syrup Take 5 mLs by mouth every 4 (four) hours as needed for cough. 118 mL 0  . ipratropium-albuterol (DUONEB) 0.5-2.5 (3) MG/3ML SOLN Take 4 times daily for 2 days and then as needed for wheezing, shortness of breath. 360 mL 3  . meloxicam (MOBIC) 7.5 MG tablet Take 7.5 mg by  mouth 2 (two) times daily.    . metFORMIN (GLUCOPHAGE) 500 MG tablet Take 1,000 mg by mouth 2 (two) times daily with a meal.     . montelukast (SINGULAIR) 10 MG tablet Take 10 mg by mouth daily.     . traMADol (ULTRAM) 50 MG tablet Take 50 mg by mouth at bedtime as needed for moderate pain.     Marland Kitchen VIVELLE-DOT 0.05 MG/24HR patch Place 1 patch onto the skin 2 (two) times a week.     . Chromium-Cinnamon (CINNAMON PLUS CHROMIUM PO) Take 3 capsules by mouth daily. 600mg     . furosemide (LASIX) 20 MG tablet Take 40 mg by mouth daily.     . pantoprazole (PROTONIX) 40 MG tablet Take 1 tablet (40 mg total) by mouth daily. 30 tablet 0  . sitaGLIPtin (JANUVIA) 50 MG tablet Take 50 mg by mouth every morning.      No facility-administered medications prior to visit.    Allergies  Allergen Reactions  . Adhesive [Tape] Hives and Rash  . Vancomycin Rash  . Zosyn [Piperacillin Sod-Tazobactam So] Rash    Family History  Problem Relation Age of Onset  . Heart disease Father   . Diabetes Father   . Hypertension Father   . Heart disease Brother   . Diabetes Brother   . Diabetes Mother   . Hypertension Mother   . Diabetes Sister   . Colon cancer Neg Hx   . Colon polyps Neg Hx   . Esophageal cancer Neg Hx   . Kidney disease Neg Hx   . Gallbladder disease Neg Hx     History  Substance Use Topics  . Smoking status: Former Smoker    Quit date: 09/24/2010  . Smokeless tobacco: Never Used  . Alcohol Use: No    ROS: As per history of present illness, otherwise negative  BP 126/64 mmHg  Pulse 76  Ht 5\' 5"  (1.651 m)  Wt 325 lb (147.419 kg)  BMI 54.08 kg/m2 Constitutional: Well-developed and well-nourished. No distress. HEENT: Normocephalic and atraumatic. Oropharynx is clear and moist. No oropharyngeal exudate. Conjunctivae are normal.  No scleral icterus. Neck: Neck supple. Trachea midline. Cardiovascular: Normal rate, regular rhythm and intact distal pulses. No M/R/G Pulmonary/chest:  Effort normal and breath sounds normal. No wheezing, rales or rhonchi. Abdominal: Soft, nontender, nondistended. Bowel sounds active throughout. There are no masses palpable. No hepatosplenomegaly. Extremities: no clubbing, cyanosis, or edema Lymphadenopathy: No cervical adenopathy noted. Neurological: Alert and oriented to person place and time. Skin: Skin is warm and dry. No rashes noted. Psychiatric: Normal mood and affect. Behavior is normal.  RELEVANT LABS AND IMAGING: CBC    Component Value Date/Time   WBC 12.4* 10/22/2014 1317   RBC 5.16* 10/22/2014 1317   HGB 10.8* 10/22/2014 1317   HCT 38.6 10/22/2014 1317   PLT 393 10/22/2014 1317   MCV 74.8* 10/22/2014 1317  MCH 20.9* 10/22/2014 1317   MCHC 28.0* 10/22/2014 1317   RDW 17.7* 10/22/2014 1317   LYMPHSABS 3.1 10/22/2014 1317   MONOABS 0.9 10/22/2014 1317   EOSABS 0.5 10/22/2014 1317   BASOSABS 0.1 10/22/2014 1317    CMP     Component Value Date/Time   NA 140 06/25/2014 1450   K 4.6 06/25/2014 1450   CL 98 06/25/2014 1450   CO2 28 06/25/2014 1450   GLUCOSE 236* 06/25/2014 1450   BUN 16 06/25/2014 1450   CREATININE 0.89 06/25/2014 1450   CALCIUM 8.8 06/25/2014 1450   PROT 6.9 06/25/2014 1450   ALBUMIN 3.1* 06/25/2014 1450   AST 22 06/25/2014 1450   ALT 57* 06/25/2014 1450   ALKPHOS 94 06/25/2014 1450   BILITOT 0.5 06/25/2014 1450   GFRNONAA 62* 06/25/2014 1450   GFRAA 72* 06/25/2014 1450   Iron/TIBC/Ferritin/ %Sat    Component Value Date/Time   FERRITIN 12 10/22/2014 1317   Colon Surgery 07/06/2012 -- reviewed pathology results diverticulosis and anastomotic rings, no evidence of malignancy  Colonoscopy 09/07/2009 - Dr. Sharlett Iles -- severe diverticulosis in the sigmoid to descending colon. Sessile descending colon polyp removed. Otherwise normal exam. Pathology = tubular adenoma  ASSESSMENT/PLAN: 75 year old female with past medical history of adenomatous colon polyps, diverticulitis status post  perforation with sigmoid resection and colostomy formation status post colostomy takedown with reanastomosis 2013, obesity, sleep apnea, hypertension and diabetes who is seen in consultation at the request of Dr. Hilma Favors for iron deficiency anemia.  1. IDA, hx of complicated diverticulitis status post sigmoid resection with ostomy placement status post takedown, history of colon polyps -- she has developed microcytic anemia with low iron counts consistent with iron deficiency. We discussed how evaluation of this issue normally involves upper endoscopy and colonoscopy. Due to her history of complicated diverticulitis and issues with wound healing after surgical resection of the left colon she is very very hesitant to have repeat colonoscopy. She would like to exhaust other options first if at all possible. We had a long discussion about this, specifically related to Cologuard. This test can very effectively exclude polyps and tumors but does not help with bleeding lesion such as angiodysplasias, however the latter is very unlikely to be life-threatening. She would prefer a less invasive tests first, but would agree to colonoscopy if the less invasive test was positive. Thus Cologuard will be ordered today. If positive, colonoscopy will be recommended. Continue IV iron supplementation through hematology on an as-needed basis. Celiac panel today. I have recommended upper endoscopy to rule out upper GI pathology and after discussion of the risks and benefits she is agreeable to proceed. This will be performed in approximate 6 weeks to allow time for Cologuard results so that colonoscopy can be added on the same day if necessary.   PN:TIRW Golding, Juliaetta Mantee [place], [place] [id number]

## 2014-12-27 NOTE — Patient Instructions (Signed)
You have been scheduled for an endoscopy. Please follow written instructions given to you at your visit today. If you use inhalers (even only as needed), please bring them with you on the day of your procedure. Your physician has requested that you go to www.startemmi.com and enter the access code given to you at your visit today. This web site gives a general overview about your procedure. However, you should still follow specific instructions given to you by our office regarding your preparation for the procedure.  We have sent your information to [facility] regarding your cologuard test. If you have not heard from them within 1 week, please call our office at [phone].  Your physician has requested that you go to the basement for the following lab work before leaving today: IgA, TtG

## 2014-12-29 LAB — TISSUE TRANSGLUTAMINASE, IGA: Tissue Transglutaminase Ab, IgA: 1 U/mL (ref <4)

## 2014-12-30 ENCOUNTER — Telehealth: Payer: Self-pay | Admitting: Internal Medicine

## 2014-12-30 NOTE — Telephone Encounter (Signed)
Pt calling for lab results. Please advise. 

## 2015-01-11 LAB — COLOGUARD: Cologuard: NEGATIVE

## 2015-01-19 ENCOUNTER — Telehealth: Payer: Self-pay | Admitting: *Deleted

## 2015-01-19 NOTE — Telephone Encounter (Signed)
Dr Hilarie Fredrickson has received patient's Cologuard results from [employer]. Results are negative. He states, "great news. No need for colonoscopy. EGD is pending." I have contacted patient to advise of Dr Vena Rua interpretation. She verbalizes understanding.

## 2015-01-21 ENCOUNTER — Encounter (HOSPITAL_COMMUNITY): Payer: 59 | Attending: Hematology & Oncology

## 2015-01-21 ENCOUNTER — Ambulatory Visit (HOSPITAL_COMMUNITY): Payer: Self-pay | Admitting: Hematology & Oncology

## 2015-01-21 ENCOUNTER — Encounter (HOSPITAL_BASED_OUTPATIENT_CLINIC_OR_DEPARTMENT_OTHER): Payer: 59 | Admitting: Oncology

## 2015-01-21 ENCOUNTER — Encounter (HOSPITAL_COMMUNITY): Payer: Self-pay | Admitting: Oncology

## 2015-01-21 VITALS — BP 124/44 | HR 84 | Temp 97.7°F | Resp 20

## 2015-01-21 DIAGNOSIS — D75839 Thrombocytosis, unspecified: Secondary | ICD-10-CM

## 2015-01-21 DIAGNOSIS — D72829 Elevated white blood cell count, unspecified: Secondary | ICD-10-CM

## 2015-01-21 DIAGNOSIS — D500 Iron deficiency anemia secondary to blood loss (chronic): Secondary | ICD-10-CM | POA: Insufficient documentation

## 2015-01-21 DIAGNOSIS — D473 Essential (hemorrhagic) thrombocythemia: Secondary | ICD-10-CM

## 2015-01-21 DIAGNOSIS — Z87891 Personal history of nicotine dependence: Secondary | ICD-10-CM | POA: Insufficient documentation

## 2015-01-21 DIAGNOSIS — Z8614 Personal history of Methicillin resistant Staphylococcus aureus infection: Secondary | ICD-10-CM | POA: Insufficient documentation

## 2015-01-21 LAB — COMPREHENSIVE METABOLIC PANEL
ALT: 18 U/L (ref 0–35)
ANION GAP: 11 (ref 5–15)
AST: 16 U/L (ref 0–37)
Albumin: 3.5 g/dL (ref 3.5–5.2)
Alkaline Phosphatase: 79 U/L (ref 39–117)
BUN: 20 mg/dL (ref 6–23)
CO2: 30 mmol/L (ref 19–32)
CREATININE: 0.83 mg/dL (ref 0.50–1.10)
Calcium: 9.1 mg/dL (ref 8.4–10.5)
Chloride: 98 mmol/L (ref 96–112)
GFR calc Af Amer: 78 mL/min — ABNORMAL LOW (ref >90)
GFR calc non Af Amer: 67 mL/min — ABNORMAL LOW (ref >90)
Glucose, Bld: 149 mg/dL — ABNORMAL HIGH (ref 70–99)
Potassium: 3.4 mmol/L — ABNORMAL LOW (ref 3.5–5.1)
Sodium: 139 mmol/L (ref 135–145)
Total Bilirubin: 0.8 mg/dL (ref 0.3–1.2)
Total Protein: 7.2 g/dL (ref 6.0–8.3)

## 2015-01-21 LAB — CBC WITH DIFFERENTIAL/PLATELET
BASOS ABS: 0.1 10*3/uL (ref 0.0–0.1)
Basophils Relative: 1 % (ref 0–1)
EOS ABS: 0.6 10*3/uL (ref 0.0–0.7)
Eosinophils Relative: 4 % (ref 0–5)
HEMATOCRIT: 45.7 % (ref 36.0–46.0)
Hemoglobin: 14 g/dL (ref 12.0–15.0)
LYMPHS PCT: 21 % (ref 12–46)
Lymphs Abs: 2.8 10*3/uL (ref 0.7–4.0)
MCH: 25.9 pg — ABNORMAL LOW (ref 26.0–34.0)
MCHC: 30.6 g/dL (ref 30.0–36.0)
MCV: 84.5 fL (ref 78.0–100.0)
MONO ABS: 1 10*3/uL (ref 0.1–1.0)
Monocytes Relative: 7 % (ref 3–12)
Neutro Abs: 8.8 10*3/uL — ABNORMAL HIGH (ref 1.7–7.7)
Neutrophils Relative %: 67 % (ref 43–77)
Platelets: 293 10*3/uL (ref 150–400)
RBC: 5.41 MIL/uL — ABNORMAL HIGH (ref 3.87–5.11)
RDW: 18.7 % — ABNORMAL HIGH (ref 11.5–15.5)
WBC: 13.3 10*3/uL — ABNORMAL HIGH (ref 4.0–10.5)

## 2015-01-21 NOTE — Assessment & Plan Note (Signed)
Reactive

## 2015-01-21 NOTE — Assessment & Plan Note (Signed)
Peripheral Blood Flow Cytometry - NO MONOCLONAL B-CELL POPULATION OR ABNORMAL T-CELL PHENOTYPE IDENTIFIED. Susanne Greenhouse MD Pathologist, Electronic Signature (Case signed 10/28/2014)

## 2015-01-21 NOTE — Patient Instructions (Signed)
[place] at [facility] Discharge Instructions  RECOMMENDATIONS MADE BY THE CONSULTANT AND ANY TEST RESULTS WILL BE SENT TO YOUR REFERRING PHYSICIAN.  Exam and discussion with Robynn Pane, PA-C. Return in 3 months for lab work and Dr. Whitney Muse. May return sooner than 3 months for labs if you feel is needed.  Just call and let us know. Call for any new or worsening symptoms, questions, or concerns.   Thank you for choosing [place] at [facility] to provide your oncology and hematology care.  To afford each patient quality time with our provider, please arrive at least 15 minutes before your scheduled appointment time.    You need to re-schedule your appointment should you arrive 10 or more minutes late.  We strive to give you quality time with our providers, and arriving late affects you and other patients whose appointments are after yours.  Also, if you no show three or more times for appointments you may be dismissed from the clinic at the providers discretion.     Again, thank you for choosing [facility].  Our hope is that these requests will decrease the amount of time that you wait before being seen by our physicians.       _____________________________________________________________  Should you have questions after your visit to [facility], please contact our office at [phone] between the hours of 8:30 a.m. and 4:30 p.m.  Voicemails left after 4:30 p.m. will not be returned until the following business day.  For prescription refill requests, have your pharmacy contact our office.

## 2015-01-21 NOTE — Assessment & Plan Note (Addendum)
S/P IV Feraheme on 10/27/2014 and 11/03/2014.  Presently undergoing GI evaluation.  Labs today: CBC diff, CMET, anemia panel  Labs in 3 months: CBC diff, iron/TIBC, ferritin  Return in 3 months for follow-up.  She knows to call us in the interim if she needs labs performed sooner than planned.

## 2015-01-21 NOTE — Progress Notes (Signed)
[name], [name] [id number]  Iron deficiency anemia due to chronic blood loss - Plan: CBC with Differential, Iron and TIBC, Ferritin  Thrombocytosis - Plan: CBC with Differential  Leukocytosis - Plan: CBC with Differential  CURRENT THERAPY: Faraheme IV last given 10/27/2014 and 11/03/2014.  INTERVAL HISTORY: [name] 74 y.o. female returns for followup of iron deficiency anemia.  I personally reviewed and went over laboratory results with the patient.  The results are noted within this dictation.  Chart reviewed.  She was seen by GI, Dr. Hilarie Fredrickson, earlier this months.  His assessment/plan follows: IDA, hx of complicated diverticulitis status post sigmoid resection with ostomy placement status post takedown, history of colon polyps -- she has developed microcytic anemia with low iron counts consistent with iron deficiency. We discussed how evaluation of this issue normally involves upper endoscopy and colonoscopy. Due to her history of complicated diverticulitis and issues with wound healing after surgical resection of the left colon she is very very hesitant to have repeat colonoscopy. She would like to exhaust other options first if at all possible. We had a long discussion about this, specifically related to Cologuard. This test can very effectively exclude polyps and tumors but does not help with bleeding lesion such as angiodysplasias, however the latter is very unlikely to be life-threatening. She would prefer a less invasive tests first, but would agree to colonoscopy if the less invasive test was positive. Thus Cologuard will be ordered today. If positive, colonoscopy will be recommended. Continue IV iron supplementation through hematology on an as-needed basis. Celiac panel today. I have recommended upper endoscopy to rule out upper GI pathology and after discussion of the risks and benefits she is agreeable to proceed. This will be  performed in approximate 6 weeks to allow time for Cologuard results so that colonoscopy can be added on the same day if necessary.  I appreciate GI recommendations and participation in this pleasant lady's care and work-up.  I provided education regarding IDA: Iron deficiency anemia is the most common anemia.  Beside playing a critical role as an oxygen carrier in the heme group of hemoglobin, iron is found in many key proteins in the cells, such as cytochromes and myoglobin, so it is not unexpected that a lack of iron has effects other than anemia.  Three studies have focused on nonanemic iron deficiency leading to fatigue.  Two studies showed that oral iron supplementation reduces fatigue, with no significant change in hemoglobin levels, in women with a ferritin level of less than 50 ng/mL, and a third study showed a lessening of fatigue with parental iron administration in women with a ferritin level of 15 ng/mL or less or an iron saturation of 20% or less.   Owing to obligate iron loss through menses, women are at greater risk for iron deficiency than men.  Iron loss in all women averages 1-3 ng per day, and dietary intake is often inadequate to maintain a positive iron balance.  A 1967 study showed that 25% of healthy, college-age women had no bone marrow iron stores and that another 33% had low stores.  Pregnancy adds to demands for iron, with requirements increasing to 6 ng per day by the end of pregnancy.  Athletes are another group at risk for iron deficiency.  Gastrointestinal tract blood is the source of iron loss, and exercise-induced hemolysis leads to urinary iron losses.  Decreased absorption of iron has also  been implicated as a cause of iron deficiency, because of levels of hepcidin are often elevated in athletes owing to training-induced inflammation.    Obesity and its surgical treatment are also at risk factors for iron deficiency.  Obese patients are often iron-deficient, with  increased hepcidin level being implicated in decreased absorption.  After bariatric surgery, the incidence of iron deficiency can be as high as 50%.  Because the main site of iron absorption is the duodenum, surgeries that involve bypassing this part of the bowel are associated with an increased incidence of iron deficiency.  However, iron deficiency is seen as a sequela of most types of bariatric surgery.    -NEJM Volume 371, No 14, pg 1325-1326   She notes that she is fatigued.  Her diastolic blood pressure is noted to be 44 which is a little lower than typical.  She does not have a BP medication on our medication list.  She notes that she sees her primary care provider next week and she is encouraged to address that with him/her.    Hematologically, otherwise, she denies any complaints and ROS questioning is negative.  Past Medical History  Diagnosis Date  . Hypertension   . Diabetes mellitus   . Arthritis   . Anemia   . Diverticulitis   . Shortness of breath     with exertion   . Sleep apnea     cpap at 10   . Bronchitis 05/2014  . Cellulitis of lower extremity 06/2014    bilateral  . Tubular adenoma of colon   . Gallstone   . MRSA (methicillin resistant Staphylococcus aureus)   . COPD (chronic obstructive pulmonary disease)   . Obesity     has OBSTRUCTIVE SLEEP APNEA; Diverticulitis of colon with perforation sigmoid colectomy and colostomy 1/15; Hypertension; Diabetes mellitus; Obesity, Class III, BMI 40-49.9 (morbid obesity); S/P colon resection; Draining postoperative wound; Cellulitis; Incisional hernia without mention of obstruction or gangrene; Thrombocytosis; Postoperative MRSA infection of lower abdominal wound; Iron deficiency anemia due to chronic blood loss; COPD exacerbation; Hypoxia; and Leukocytosis on her problem list.     is allergic to adhesive; vancomycin; and zosyn.  Ms. Graybeal does not currently have medications on file.  Past Surgical History  Procedure  Laterality Date  . Appendectomy    . Back surgery    . Colon resection  10/09/2011    Procedure: COLON RESECTION;  Surgeon: Gayland Curry, MD;  Location: [facility];  Service: General;  Laterality: N/A;  Colon Resection with colostomy  . Jackson pratt    . Application of wound vac  wound vac  . Abdominal hysterectomy  1979  . Proctoscopy  06/26/2012    Procedure: PROCTOSCOPY;  Surgeon: Gayland Curry, MD,FACS;  Location: [facility];  Service: General;  Laterality: N/A;  Rigid Proctoscopy  . Colon resection  06/26/2012    Procedure: COLON RESECTION;  Surgeon: Gayland Curry, MD,FACS;  Location: [facility];  Service: General;  Laterality: N/A;  . Application of wound vac  06/26/2012    Procedure: APPLICATION OF WOUND VAC;  Surgeon: Gayland Curry, MD,FACS;  Location: [facility];  Service: General;;  . Abdominal hysterectomy  1978    Denies any headaches, dizziness, double vision, fevers, chills, night sweats, nausea, vomiting, diarrhea, constipation, chest pain, heart palpitations, shortness of breath, blood in stool, black tarry stool, urinary pain, urinary burning, urinary frequency, hematuria.   PHYSICAL EXAMINATION  ECOG PERFORMANCE STATUS: 2 - Symptomatic, <50% confined to bed  Filed Vitals:   01/21/15 1416  BP: 124/44  Pulse: 84  Temp: 97.7 F (36.5 C)  Resp: 20    GENERAL:alert, no distress, well nourished, well developed, comfortable, cooperative, obese and smiling SKIN: skin color, texture, turgor are normal, no rashes or significant lesions HEAD: Normocephalic, No masses, lesions, tenderness or abnormalities EYES: normal, PERRLA, EOMI, Conjunctiva are pink and non-injected EARS: External ears normal OROPHARYNX:lips, buccal mucosa, and tongue normal and mucous membranes are moist  NECK: supple, no adenopathy, thyroid normal size, non-tender, without nodularity, no stridor, non-tender, trachea midline LYMPH:  no palpable lymphadenopathy BREAST:not examined LUNGS: clear to auscultation  HEART:  regular rate & rhythm ABDOMEN:abdomen soft, non-tender, obese and normal bowel sounds BACK: Back symmetric, no curvature. EXTREMITIES:less then 2 second capillary refill, no joint deformities, effusion, or inflammation, no skin discoloration, no cyanosis  NEURO: alert & oriented x 3 with fluent speech, no focal motor/sensory deficits, in a wheelchair   LABORATORY DATA: CBC    Component Value Date/Time   WBC 12.4* 10/22/2014 1317   RBC 5.16* 10/22/2014 1317   HGB 10.8* 10/22/2014 1317   HCT 38.6 10/22/2014 1317   PLT 393 10/22/2014 1317   MCV 74.8* 10/22/2014 1317   MCH 20.9* 10/22/2014 1317   MCHC 28.0* 10/22/2014 1317   RDW 17.7* 10/22/2014 1317   LYMPHSABS 3.1 10/22/2014 1317   MONOABS 0.9 10/22/2014 1317   EOSABS 0.5 10/22/2014 1317   BASOSABS 0.1 10/22/2014 1317      Chemistry      Component Value Date/Time   NA 140 06/25/2014 1450   K 4.6 06/25/2014 1450   CL 98 06/25/2014 1450   CO2 28 06/25/2014 1450   BUN 16 06/25/2014 1450   CREATININE 0.89 06/25/2014 1450      Component Value Date/Time   CALCIUM 8.8 06/25/2014 1450   ALKPHOS 94 06/25/2014 1450   AST 22 06/25/2014 1450   ALT 57* 06/25/2014 1450   BILITOT 0.5 06/25/2014 1450      Lab Results  Component Value Date   FERRITIN 12 10/22/2014      ASSESSMENT AND PLAN:  Iron deficiency anemia due to chronic blood loss S/P IV Feraheme on 10/27/2014 and 11/03/2014.  Presently undergoing GI evaluation.  Labs today: CBC diff, CMET, anemia panel  Labs in 3 months: CBC diff, iron/TIBC, ferritin  Return in 3 months for follow-up.  She knows to call us in the interim if she needs labs performed sooner than planned.   Thrombocytosis Reactive   Leukocytosis Peripheral Blood Flow Cytometry - NO MONOCLONAL B-CELL POPULATION OR ABNORMAL T-CELL PHENOTYPE IDENTIFIED. Susanne Greenhouse MD Pathologist, Electronic Signature (Case signed 10/28/2014)     THERAPY PLAN:  We will continue to monitor counts and provide  iron support.  She is presently undergoing GI evaluation to better understand the etiology of iron deficiency anemia.  All questions were answered. The patient knows to call the clinic with any problems, questions or concerns. We can certainly see the patient much sooner if necessary.  Patient and plan discussed with Dr. Ancil Linsey and she is in agreement with the aforementioned.   This note is electronically signed by: Robynn Pane 01/21/2015 2:51 PM

## 2015-01-22 LAB — FERRITIN: Ferritin: 17 ng/mL (ref 10–291)

## 2015-01-22 LAB — IRON AND TIBC
Iron: 44 ug/dL (ref 42–145)
Saturation Ratios: 10 % — ABNORMAL LOW (ref 20–55)
TIBC: 438 ug/dL (ref 250–470)
UIBC: 394 ug/dL (ref 125–400)

## 2015-01-22 LAB — VITAMIN B12: VITAMIN B 12: 549 pg/mL (ref 211–911)

## 2015-01-22 LAB — FOLATE: Folate: 6.7 ng/mL

## 2015-01-24 NOTE — Progress Notes (Signed)
Labs drawn

## 2015-01-25 ENCOUNTER — Other Ambulatory Visit (HOSPITAL_COMMUNITY): Payer: Self-pay | Admitting: Oncology

## 2015-01-25 DIAGNOSIS — D500 Iron deficiency anemia secondary to blood loss (chronic): Secondary | ICD-10-CM

## 2015-01-26 ENCOUNTER — Telehealth (HOSPITAL_COMMUNITY): Payer: Self-pay | Admitting: *Deleted

## 2015-01-26 NOTE — Telephone Encounter (Signed)
Patient called and asked to talk to Dr. Whitney Muse re: the need for having the endoscopy done. She states she had a "cologuard" test which Dr. Hilarie Fredrickson states was negative for cancer or polyps. She states she feels good, has been told that her hemoglobin is good and she wonders if and why the endoscopy is needed. I explained that we are looking for hidden blood loss that is causing her iron to stay low. She would like to hear Dr. Donald Pore explanation of this and her opinion on if the EGD is really necessary.

## 2015-01-26 NOTE — Telephone Encounter (Signed)
Patient called today and questioned if Endoscopy test still needed in light of a negative colo guard and normal hemoglobin. Explained to patient the importance of looking for unexplained blood loss in light of continued iron deficiency. She also states that she has an old incision from the perforated diverticulum that oozes blood and wonders if this could be a reason for low iron?? She has not made up her mind on the EGD as of yet.

## 2015-01-27 ENCOUNTER — Ambulatory Visit (INDEPENDENT_AMBULATORY_CARE_PROVIDER_SITE_OTHER): Payer: Medicare Other

## 2015-01-27 DIAGNOSIS — B351 Tinea unguium: Secondary | ICD-10-CM | POA: Diagnosis not present

## 2015-01-27 DIAGNOSIS — M79676 Pain in unspecified toe(s): Secondary | ICD-10-CM | POA: Diagnosis not present

## 2015-01-27 DIAGNOSIS — E1140 Type 2 diabetes mellitus with diabetic neuropathy, unspecified: Secondary | ICD-10-CM | POA: Diagnosis not present

## 2015-01-28 ENCOUNTER — Ambulatory Visit (HOSPITAL_COMMUNITY): Payer: Medicare Other

## 2015-01-28 NOTE — Progress Notes (Signed)
   Subjective:    Patient ID: [name], female    DOB: [birth year], 74 y.o.   MRN: [id number]  HPI Comments: Pt presents for debridement of 10 toenails.     Review of Systems no new findings or systemic changes noted     Objective:   Physical Exam Lower extremity objective findings as follows pedal pulses are palpable DP +2/4 bilateral PT one over 4 bilateral capillary fill time 3 seconds all digits epicritic and proprioceptive sensations decreased Semmes Weinstein to forefoot heel and arch. There is normal plantar response DTRs not listed neurologically skin color pigment normal hair growth absent nails thick brittle criptotic incurvated friable 1 through 5 bilateral no open wounds or ulcers no secondary infections both hallux nails tender to ingrown medial border and proptosis the painful and symptomatic in the nail folds patient wearing appropriate shoes no open wounds or ulcers noted no signs of infection       Assessment & Plan:  Assessment this time diabetes with history of neuropathy. Thick brittle dystrophic criptotic nails debridement presence of diabetes and complications return for future palliative nail care as needed patient walks with the assistance of a walker in no ulcers no secondary infection is noted recheck in 3 months  Harriet Masson DPM

## 2015-01-31 ENCOUNTER — Encounter (HOSPITAL_COMMUNITY): Payer: 59 | Attending: Hematology & Oncology

## 2015-01-31 VITALS — BP 117/66 | HR 65 | Temp 97.7°F | Resp 18

## 2015-01-31 DIAGNOSIS — Z8614 Personal history of Methicillin resistant Staphylococcus aureus infection: Secondary | ICD-10-CM | POA: Insufficient documentation

## 2015-01-31 DIAGNOSIS — Z87891 Personal history of nicotine dependence: Secondary | ICD-10-CM | POA: Insufficient documentation

## 2015-01-31 DIAGNOSIS — D500 Iron deficiency anemia secondary to blood loss (chronic): Secondary | ICD-10-CM | POA: Insufficient documentation

## 2015-01-31 MED ORDER — SODIUM CHLORIDE 0.9 % IV SOLN
510.0000 mg | Freq: Once | INTRAVENOUS | Status: DC
  Filled 2015-01-31: qty 17

## 2015-01-31 MED ORDER — SODIUM CHLORIDE 0.9 % IV SOLN
INTRAVENOUS | Status: DC
  Administered 2015-01-31: 14:00:00 via INTRAVENOUS

## 2015-01-31 MED ORDER — SODIUM CHLORIDE 0.9 % IV SOLN
250.0000 mg | Freq: Once | INTRAVENOUS | Status: AC
  Administered 2015-01-31: 250 mg via INTRAVENOUS
  Filled 2015-01-31: qty 20

## 2015-01-31 NOTE — Patient Instructions (Signed)
[place] at [facility] Discharge Instructions  RECOMMENDATIONS MADE BY THE CONSULTANT AND ANY TEST RESULTS WILL BE SENT TO YOUR REFERRING PHYSICIAN.  Ferric Gluconate 250 mg infusion today as ordered. Return as scheduled.  Thank you for choosing [place] at [facility] to provide your oncology and hematology care.  To afford each patient quality time with our provider, please arrive at least 15 minutes before your scheduled appointment time.    You need to re-schedule your appointment should you arrive 10 or more minutes late.  We strive to give you quality time with our providers, and arriving late affects you and other patients whose appointments are after yours.  Also, if you no show three or more times for appointments you may be dismissed from the clinic at the providers discretion.     Again, thank you for choosing [facility].  Our hope is that these requests will decrease the amount of time that you wait before being seen by our physicians.       _____________________________________________________________  Should you have questions after your visit to [facility], please contact our office at [phone] between the hours of 8:30 a.m. and 4:30 p.m.  Voicemails left after 4:30 p.m. will not be returned until the following business day.  For prescription refill requests, have your pharmacy contact our office.

## 2015-01-31 NOTE — Progress Notes (Signed)
Tolerated iron infusion well. 

## 2015-02-07 ENCOUNTER — Telehealth: Payer: Self-pay | Admitting: Internal Medicine

## 2015-02-07 NOTE — Telephone Encounter (Signed)
I explained to patient that we did not contact her regarding her medications. However, endoscopy department may have ([name]!? She is not sure who called so I advised to wait for another call. Patient had multiple questions about endoscopy which we have gone over in detail. Patient will call us back if she has not heard anything back in the next few days at which point I told her we could contact the endoscopy department to find out if they contacted her for some reason.

## 2015-02-10 ENCOUNTER — Encounter (HOSPITAL_COMMUNITY): Payer: Self-pay | Admitting: *Deleted

## 2015-02-17 ENCOUNTER — Encounter (HOSPITAL_COMMUNITY)
Admission: RE | Disposition: A | Discharge status: Home / Self Care | Payer: Self-pay | Source: Ambulatory Visit | Attending: Internal Medicine

## 2015-02-17 ENCOUNTER — Ambulatory Visit (HOSPITAL_COMMUNITY)
Admission: RE | Admit: 2015-02-17 | Discharge: 2015-02-17 | Disposition: A | Discharge status: Home / Self Care | Payer: Medicare Other | Source: Ambulatory Visit | Attending: Internal Medicine | Admitting: Internal Medicine

## 2015-02-17 ENCOUNTER — Encounter: Payer: Self-pay | Admitting: Internal Medicine

## 2015-02-17 ENCOUNTER — Encounter (HOSPITAL_COMMUNITY): Payer: Self-pay | Admitting: *Deleted

## 2015-02-17 ENCOUNTER — Ambulatory Visit (HOSPITAL_COMMUNITY): Payer: Medicare Other | Admitting: Anesthesiology

## 2015-02-17 ENCOUNTER — Telehealth: Payer: Self-pay | Admitting: Internal Medicine

## 2015-02-17 DIAGNOSIS — D509 Iron deficiency anemia, unspecified: Secondary | ICD-10-CM | POA: Insufficient documentation

## 2015-02-17 DIAGNOSIS — Z888 Allergy status to other drugs, medicaments and biological substances status: Secondary | ICD-10-CM | POA: Diagnosis not present

## 2015-02-17 DIAGNOSIS — E669 Obesity, unspecified: Secondary | ICD-10-CM | POA: Diagnosis not present

## 2015-02-17 DIAGNOSIS — G4730 Sleep apnea, unspecified: Secondary | ICD-10-CM | POA: Insufficient documentation

## 2015-02-17 DIAGNOSIS — Z87891 Personal history of nicotine dependence: Secondary | ICD-10-CM | POA: Insufficient documentation

## 2015-02-17 DIAGNOSIS — I10 Essential (primary) hypertension: Secondary | ICD-10-CM | POA: Insufficient documentation

## 2015-02-17 DIAGNOSIS — Z9049 Acquired absence of other specified parts of digestive tract: Secondary | ICD-10-CM | POA: Insufficient documentation

## 2015-02-17 DIAGNOSIS — E119 Type 2 diabetes mellitus without complications: Secondary | ICD-10-CM | POA: Diagnosis not present

## 2015-02-17 DIAGNOSIS — Z90710 Acquired absence of both cervix and uterus: Secondary | ICD-10-CM | POA: Insufficient documentation

## 2015-02-17 DIAGNOSIS — K2970 Gastritis, unspecified, without bleeding: Secondary | ICD-10-CM | POA: Insufficient documentation

## 2015-02-17 DIAGNOSIS — J449 Chronic obstructive pulmonary disease, unspecified: Secondary | ICD-10-CM | POA: Diagnosis not present

## 2015-02-17 DIAGNOSIS — Z86010 Personal history of colonic polyps: Secondary | ICD-10-CM | POA: Insufficient documentation

## 2015-02-17 HISTORY — PX: ESOPHAGOGASTRODUODENOSCOPY (EGD) WITH PROPOFOL: SHX5813

## 2015-02-17 LAB — GLUCOSE, CAPILLARY: Glucose-Capillary: 171 mg/dL — ABNORMAL HIGH (ref 65–99)

## 2015-02-17 SURGERY — ESOPHAGOGASTRODUODENOSCOPY (EGD) WITH PROPOFOL
Anesthesia: Monitor Anesthesia Care

## 2015-02-17 MED ORDER — KETAMINE HCL 10 MG/ML IJ SOLN
INTRAMUSCULAR | Status: AC
  Filled 2015-02-17: qty 1

## 2015-02-17 MED ORDER — LACTATED RINGERS IV SOLN
INTRAVENOUS | Status: DC
  Administered 2015-02-17: 08:00:00 via INTRAVENOUS

## 2015-02-17 MED ORDER — PROPOFOL 10 MG/ML IV BOLUS
INTRAVENOUS | Status: AC
  Filled 2015-02-17: qty 20

## 2015-02-17 MED ORDER — PROPOFOL 10 MG/ML IV BOLUS
INTRAVENOUS | Status: DC | PRN
  Administered 2015-02-17 (×2): 10 mg via INTRAVENOUS

## 2015-02-17 MED ORDER — KETAMINE HCL 10 MG/ML IJ SOLN
INTRAMUSCULAR | Status: DC | PRN
  Administered 2015-02-17 (×3): 10 mg via INTRAVENOUS

## 2015-02-17 MED ORDER — LIDOCAINE HCL (CARDIAC) 20 MG/ML IV SOLN
INTRAVENOUS | Status: AC
  Filled 2015-02-17: qty 5

## 2015-02-17 MED ORDER — SODIUM CHLORIDE 0.9 % IV SOLN: INTRAVENOUS | Status: DC

## 2015-02-17 MED ORDER — BUTAMBEN-TETRACAINE-BENZOCAINE 2-2-14 % EX AERO
INHALATION_SPRAY | CUTANEOUS | Status: DC | PRN
  Administered 2015-02-17: 2 via TOPICAL

## 2015-02-17 MED ORDER — PANTOPRAZOLE SODIUM 40 MG PO TBEC: 40.0000 mg | DELAYED_RELEASE_TABLET | Freq: Every day | ORAL | Status: DC

## 2015-02-17 SURGICAL SUPPLY — 15 items

## 2015-02-17 NOTE — Telephone Encounter (Signed)
Spoke with pt and per procedure report pt is to take protonix 40mg  daily. Script sent to pharmacy and pt aware.

## 2015-02-17 NOTE — Discharge Instructions (Signed)
YOU HAD AN ENDOSCOPIC PROCEDURE TODAY: Refer to the procedure report that was given to you for any specific questions about what was found during the examination.  If the procedure report does not answer your questions, please call your gastroenterologist to clarify. ° °YOU SHOULD EXPECT: Some feelings of bloating in the abdomen. Passage of more gas than usual.  Walking can help get rid of the air that was put into your GI tract during the procedure and reduce the bloating. If you had a lower endoscopy (such as a colonoscopy or flexible sigmoidoscopy) you may notice spotting of blood in your stool or on the toilet paper.  ° °DIET: Your first meal following the procedure should be a light meal and then it is ok to progress to your normal diet.  A half-sandwich or bowl of soup is an example of a good first meal.  Heavy or fried foods are harder to digest and may make you feel nasueas or bloated.  Drink plenty of fluids but you should avoid alcoholic beverages for 24 hours. ° °ACTIVITY: Your care partner should take you home directly after the procedure.  You should plan to take it easy, moving slowly for the rest of the day.  You can resume normal activity the day after the procedure however you should NOT DRIVE or use heavy machinery for 24 hours (because of the sedation medicines used during the test).   ° °SYMPTOMS TO REPORT IMMEDIATELY  °A gastroenterologist can be reached at any hour.  Please call your doctor's office for any of the following symptoms: ° °· Following lower endoscopy (colonoscopy, flexible sigmoidoscopy) ° Excessive amounts of blood in the stool ° Significant tenderness, worsening of abdominal pains ° Swelling of the abdomen that is new, acute ° Fever of 100° or higher °· Following upper endoscopy (EGD, EUS, ERCP) ° Vomiting of blood or coffee ground material ° New, significant abdominal pain ° New, significant chest pain or pain under the shoulder blades ° Painful or persistently difficult  swallowing ° New shortness of breath ° Black, tarry-looking stools ° °FOLLOW UP: °If any biopsies were taken you will be contacted by phone or by letter within the next 1-3 weeks.  Call your gastroenterologist if you have not heard about the biopsies in 3 weeks.  °Please also call your gastroenterologist's office with any specific questions about appointments or follow up tests. °Esophagogastroduodenoscopy °Esophagogastroduodenoscopy (EGD) is a procedure to examine the lining of the esophagus, stomach, and first part of the small intestine (duodenum). A long, flexible, lighted tube with a camera attached (endoscope) is inserted down the throat to view these organs. This procedure is done to detect problems or abnormalities, such as inflammation, bleeding, ulcers, or growths, in order to treat them. The procedure lasts about 5-20 minutes. It is usually an outpatient procedure, but it may need to be performed in emergency cases in the hospital. °LET YOUR CAREGIVER KNOW ABOUT:  °· Allergies to food or medicine. °· All medicines you are taking, including vitamins, herbs, eyedrops, and over-the-counter medicines and creams. °· Use of steroids (by mouth or creams). °· Previous problems you or members of your family have had with the use of anesthetics. °· Any blood disorders you have. °· Previous surgeries you have had. °· Other health problems you have. °· Possibility of pregnancy, if this applies. °RISKS AND COMPLICATIONS  °Generally, EGD is a safe procedure. However, as with any procedure, complications can occur. Possible complications include: °· Infection. °· Bleeding. °· Tearing (perforation)   of the esophagus, stomach, or duodenum. °· Difficulty breathing or not being able to breath. °· Excessive sweating. °· Spasms of the larynx. °· Slowed heartbeat. °· Low blood pressure. °BEFORE THE PROCEDURE °· Do not eat or drink anything for 6-8 hours before the procedure or as directed by your caregiver. °· Ask your  caregiver about changing or stopping your regular medicines. °· If you wear dentures, be prepared to remove them before the procedure. °· Arrange for someone to drive you home after the procedure. °PROCEDURE  °· A vein will be accessed to give medicines and fluids. A medicine to relax you (sedative) and a pain reliever will be given through that access into the vein. °· A numbing medicine (local anesthetic) may be sprayed on your throat for comfort and to stop you from gagging or coughing. °· A mouth guard may be placed in your mouth to protect your teeth and to keep you from biting on the endoscope. °· You will be asked to lie on your left side. °· The endoscope is inserted down your throat and into the esophagus, stomach, and duodenum. °· Air is put through the endoscope to allow your caregiver to view the lining of your esophagus clearly. °· The esophagus, stomach, and duodenum is then examined. During the exam, your caregiver may: °¨ Remove tissue to be examined under a microscope (biopsy) for inflammation, infection, or other medical problems. °¨ Remove growths. °¨ Remove objects (foreign bodies) that are stuck. °¨ Treat any bleeding with medicines or other devices that stop tissues from bleeding (hot cautery, clipping devices). °¨ Widen (dilate) or stretch narrowed areas of the esophagus and stomach. °· The endoscope will then be withdrawn. °AFTER THE PROCEDURE °· You will be taken to a recovery area to be monitored. You will be able to go home once you are stable and alert. °· Do not eat or drink anything until the local anesthetic and numbing medicines have worn off. You may choke. °· It is normal to feel bloated, have pain with swallowing, or have a sore throat for a short time. This will wear off. °· Your caregiver should be able to discuss his or her findings with you. It will take longer to discuss the test results if any biopsies were taken. °Document Released: 01/11/2005 Document Revised: 01/25/2014  Document Reviewed: 08/13/2012 °ExitCare® Patient Information ©2015 [employer]. This information is not intended to replace advice given to you by your health care provider. Make sure you discuss any questions you have with your health care provider. ° °

## 2015-02-17 NOTE — Op Note (Signed)
[facility] [place], [address]   ENDOSCOPY PROCEDURE REPORT  PATIENT: Jemiah, Cuadra  MR#: [id number] BIRTHDATE: [birth year] , 34  yrs. old GENDER: female ENDOSCOPIST: Jerene Bears, MD REFERRED BY:  Sharilyn Sites, M.D. PROCEDURE DATE:  02/17/2015 PROCEDURE:  EGD, diagnostic and EGD w/ biopsy ASA CLASS:     Class III INDICATIONS:  iron deficiency anemia. MEDICATIONS: Monitored anesthesia care and Per Anesthesia TOPICAL ANESTHETIC: Cetacaine Spray  DESCRIPTION OF PROCEDURE: After the risks benefits and alternatives of the procedure were thoroughly explained, informed consent was obtained.  The Pentax Gastroscope [id number] endoscope was introduced through the mouth and advanced to the second portion of the duodenum , Without limitations.  The instrument was slowly withdrawn as the mucosa was fully examined.   ESOPHAGUS: The mucosa of the esophagus appeared normal.  STOMACH: Congested gastritis (inflammation) with scant adherent heme was found in the gastric body and gastric antrum.  Cold forcep biopsies were taken at the gastric body, antrum and angularis to evaluate for h.  pylori.  DUODENUM: The duodenal mucosa showed no abnormalities in the bulb and 2nd part of the duodenum.  Cold forcep biopsies were taken in the second portion. Retroflexed views revealed no abnormalities.     The scope was then withdrawn from the patient and the procedure completed.  COMPLICATIONS: There were no immediate complications.  ENDOSCOPIC IMPRESSION: 1.   The mucosa of the esophagus appeared normal 2.   Gastritis (inflammation) was found in the gastric body and gastric antrum; multiple biopsies 3.   The duodenal mucosa showed no abnormalities in the bulb and 2nd part of the duodenum; multiple biopsies  RECOMMENDATIONS: 1.  Await biopsy results 2.  Begin pantoprazole 40 mg daily given gastritis and NSAID use 3.  Monitor hemoglobin and iron studies are hematology,  if anemia progresses would consider video capsule endoscopy  eSigned:  Jerene Bears, MD 02/17/2015 9:08 AM QI:[facility] Hilma Favors, MD, the patient

## 2015-02-17 NOTE — Transfer of Care (Signed)
Immediate Anesthesia Transfer of Care Note  Patient: [name]  Procedure(s) Performed: Procedure(s): ESOPHAGOGASTRODUODENOSCOPY (EGD) WITH PROPOFOL (N/A)  Patient Location: PACU  Anesthesia Type:MAC  Level of Consciousness: awake, alert  and oriented  Airway & Oxygen Therapy: Patient Spontanous Breathing and Patient connected to nasal cannula oxygen  Post-op Assessment: Report given to RN and Post -op Vital signs reviewed and stable  Post vital signs: Reviewed and stable  Last Vitals:  Filed Vitals:   02/17/15 0801  BP: 170/68  Temp: 36.7 C  Resp: 19    Complications: No apparent anesthesia complications

## 2015-02-17 NOTE — Anesthesia Preprocedure Evaluation (Signed)
Anesthesia Evaluation  Patient identified by MRN, date of birth, ID band Patient awake    Reviewed: Allergy & Precautions, NPO status , Patient's Chart, lab work & pertinent test results  Airway Mallampati: II   Neck ROM: Full    Dental   Pulmonary shortness of breath, sleep apnea , COPDformer smoker,  On home 02 prn. breath sounds clear to auscultation        Cardiovascular hypertension, Rhythm:Regular Rate:Normal     Neuro/Psych    GI/Hepatic Neg liver ROS, History noted. CE   Endo/Other  diabetes  Renal/GU negative Renal ROS     Musculoskeletal   Abdominal   Peds  Hematology   Anesthesia Other Findings   Reproductive/Obstetrics                             Anesthesia Physical Anesthesia Plan  ASA: III  Anesthesia Plan: MAC   Post-op Pain Management:    Induction: Intravenous  Airway Management Planned: Nasal Cannula  Additional Equipment:   Intra-op Plan:   Post-operative Plan:   Informed Consent:   Dental advisory given  Plan Discussed with: CRNA and Anesthesiologist  Anesthesia Plan Comments:         Anesthesia Quick Evaluation

## 2015-02-17 NOTE — Anesthesia Postprocedure Evaluation (Signed)
  Anesthesia Post-op Note  Patient: [name]  Procedure(s) Performed: Procedure(s): ESOPHAGOGASTRODUODENOSCOPY (EGD) WITH PROPOFOL (N/A)  Patient Location: PACU and Endoscopy Unit  Anesthesia Type:MAC  Level of Consciousness: awake  Airway and Oxygen Therapy: Patient Spontanous Breathing  Post-op Pain: mild  Post-op Assessment: Post-op Vital signs reviewed  Post-op Vital Signs: Reviewed  Last Vitals:  Filed Vitals:   02/17/15 0942  BP: 169/85  Pulse: 72  Temp:   Resp: 25    Complications: No apparent anesthesia complications

## 2015-02-17 NOTE — H&P (Signed)
HPI: [name] is a 74 year old female with past medical history of adenomatous colon polyps, diverticulitis status post perforation with sigmoid resection and colostomy formation status post colostomy takedown with reanastomosis 2013, obesity, sleep apnea, hypertension and diabetes who was seen in consultation at the request of Dr. Hilma Favors for iron deficiency anemia. She presents today for EGD to evaluate iron def anemia.  After her visit with me she very much did not want colonoscopy, and Cologuard was ordered and negative. Most recently blood counts have improved. She reports overall she has no new complaints. No abdominal pain. Good appetite. No dysphagia or odynophagia. No nausea or vomiting. No blood in her stool.  Past Medical History  Diagnosis Date  . Hypertension   . Diabetes mellitus   . Arthritis   . Anemia   . Diverticulitis   . Shortness of breath     with exertion   . Sleep apnea     cpap at 10   . Bronchitis 05/2014  . Cellulitis of lower extremity 06/2014    bilateral  . Tubular adenoma of colon   . Gallstone   . MRSA (methicillin resistant Staphylococcus aureus)     has now "tested negative"- no open wounds as of 02-10-15  . COPD (chronic obstructive pulmonary disease)   . Obesity   . Sleep apnea     Past Surgical History  Procedure Laterality Date  . Appendectomy    . Back surgery    . Colon resection  10/09/2011    Procedure: COLON RESECTION;  Surgeon: Gayland Curry, MD;  Location: [place];  Service: General;  Laterality: N/A;  Colon Resection with colostomy  . Jackson pratt    . Application of wound vac  wound vac  . Abdominal hysterectomy  1979  . Proctoscopy  06/26/2012    Procedure: PROCTOSCOPY;  Surgeon: Gayland Curry, MD,FACS;  Location: [facility];  Service: General;  Laterality: N/A;  Rigid Proctoscopy  . Colon resection  06/26/2012    Procedure: COLON RESECTION;  Surgeon: Gayland Curry, MD,FACS;  Location: [facility];  Service: General;  Laterality: N/A;  .  Application of wound vac  06/26/2012    Procedure: APPLICATION OF WOUND VAC;  Surgeon: Gayland Curry, MD,FACS;  Location: [facility];  Service: General;;  . Abdominal hysterectomy  1978     (Not in an outpatient encounter)  Allergies  Allergen Reactions  . Adhesive [Tape] Hives and Rash  . Vancomycin Rash  . Zosyn [Piperacillin Sod-Tazobactam So] Rash    Family History  Problem Relation Age of Onset  . Heart disease Father   . Diabetes Father   . Hypertension Father   . Heart disease Brother   . Diabetes Brother   . Diabetes Mother   . Hypertension Mother   . Diabetes Sister   . Colon cancer Neg Hx   . Colon polyps Neg Hx   . Esophageal cancer Neg Hx   . Kidney disease Neg Hx   . Gallbladder disease Neg Hx     History  Substance Use Topics  . Smoking status: Former Smoker    Quit date: 09/24/2010  . Smokeless tobacco: Never Used  . Alcohol Use: No    ROS: As per history of present illness, otherwise negative  BP 170/68 mmHg  Temp(Src) 98 F (36.7 C) (Oral)  Resp 19  Ht 5\' 5"  (1.651 m)  Wt 320 lb (145.151 kg)  BMI 53.25 kg/m2  SpO2 91% Gen: awake, alert, NAD HEENT: anicteric,  op clear CV: RRR, no mrg Pulm: CTA b/l though distant Abd: soft, obese, NT,+BS throughout Ext: no c/c, 1+ edema Neuro: nonfocal   RELEVANT LABS AND IMAGING: CBC    Component Value Date/Time   WBC 13.3* 01/21/2015 1430   RBC 5.41* 01/21/2015 1430   HGB 14.0 01/21/2015 1430   HCT 45.7 01/21/2015 1430   PLT 293 01/21/2015 1430   MCV 84.5 01/21/2015 1430   MCH 25.9* 01/21/2015 1430   MCHC 30.6 01/21/2015 1430   RDW 18.7* 01/21/2015 1430   LYMPHSABS 2.8 01/21/2015 1430   MONOABS 1.0 01/21/2015 1430   EOSABS 0.6 01/21/2015 1430   BASOSABS 0.1 01/21/2015 1430    CMP     Component Value Date/Time   NA 139 01/21/2015 1430   K 3.4* 01/21/2015 1430   CL 98 01/21/2015 1430   CO2 30 01/21/2015 1430   GLUCOSE 149* 01/21/2015 1430   BUN 20 01/21/2015 1430   CREATININE 0.83  01/21/2015 1430   CALCIUM 9.1 01/21/2015 1430   PROT 7.2 01/21/2015 1430   ALBUMIN 3.5 01/21/2015 1430   AST 16 01/21/2015 1430   ALT 18 01/21/2015 1430   ALKPHOS 79 01/21/2015 1430   BILITOT 0.8 01/21/2015 1430   GFRNONAA 67* 01/21/2015 1430   GFRAA 78* 01/21/2015 1430   Iron/TIBC/Ferritin/ %Sat    Component Value Date/Time   IRON 44 01/21/2015 1430   TIBC 438 01/21/2015 1430   FERRITIN 17 01/21/2015 1430   IRONPCTSAT 10* 01/21/2015 1430    ASSESSMENT/PLAN: 74 year old female with past medical history of adenomatous colon polyps, diverticulitis status post perforation with sigmoid resection and colostomy formation status post colostomy takedown with reanastomosis 2013, obesity, sleep apnea, hypertension and diabetes who presents for EGD in evaluation for iron deficiency anemia.  1. IDA, hx of complicated diverticulitis status post sigmoid resection with ostomy placement status post takedown, history of colon polyps -- fortunately hemoglobin has improved. She has received IV iron. Cologuard was negative and with her insistence on avoiding repeat colonoscopy, we are not performing colonoscopy today. Plan for upper endoscopy to further evaluate history of iron deficiency. The nature of the procedure, as well as the risks, benefits, and alternatives were carefully and thoroughly reviewed with the patient. Ample time for discussion and questions allowed. The patient understood, was satisfied, and agreed to proceed.

## 2015-02-18 ENCOUNTER — Encounter: Payer: Self-pay | Admitting: Internal Medicine

## 2015-02-18 ENCOUNTER — Encounter (HOSPITAL_COMMUNITY): Payer: Self-pay | Admitting: Internal Medicine

## 2015-03-01 NOTE — Telephone Encounter (Signed)
CMA called pt with results.See note on 01-19-15.

## 2015-04-22 ENCOUNTER — Encounter (HOSPITAL_COMMUNITY): Payer: Medicare Other | Attending: Hematology & Oncology | Admitting: Hematology & Oncology

## 2015-04-22 ENCOUNTER — Encounter (HOSPITAL_COMMUNITY): Payer: Self-pay | Admitting: Hematology & Oncology

## 2015-04-22 ENCOUNTER — Encounter (HOSPITAL_BASED_OUTPATIENT_CLINIC_OR_DEPARTMENT_OTHER): Payer: Medicare Other

## 2015-04-22 VITALS — BP 128/60 | HR 81 | Temp 98.2°F | Resp 20 | Wt 313.0 lb

## 2015-04-22 DIAGNOSIS — D72829 Elevated white blood cell count, unspecified: Secondary | ICD-10-CM | POA: Diagnosis not present

## 2015-04-22 DIAGNOSIS — D473 Essential (hemorrhagic) thrombocythemia: Secondary | ICD-10-CM | POA: Diagnosis not present

## 2015-04-22 DIAGNOSIS — D75839 Thrombocytosis, unspecified: Secondary | ICD-10-CM

## 2015-04-22 DIAGNOSIS — D500 Iron deficiency anemia secondary to blood loss (chronic): Secondary | ICD-10-CM | POA: Diagnosis not present

## 2015-04-22 LAB — CBC WITH DIFFERENTIAL/PLATELET
BASOS ABS: 0.1 10*3/uL (ref 0.0–0.1)
BASOS PCT: 0 % (ref 0–1)
EOS ABS: 0.3 10*3/uL (ref 0.0–0.7)
Eosinophils Relative: 3 % (ref 0–5)
HCT: 38.3 % (ref 36.0–46.0)
Hemoglobin: 11.2 g/dL — ABNORMAL LOW (ref 12.0–15.0)
LYMPHS ABS: 3 10*3/uL (ref 0.7–4.0)
Lymphocytes Relative: 24 % (ref 12–46)
MCH: 23.1 pg — AB (ref 26.0–34.0)
MCHC: 29.2 g/dL — AB (ref 30.0–36.0)
MCV: 79 fL (ref 78.0–100.0)
MONO ABS: 0.9 10*3/uL (ref 0.1–1.0)
MONOS PCT: 7 % (ref 3–12)
Neutro Abs: 8.3 10*3/uL — ABNORMAL HIGH (ref 1.7–7.7)
Neutrophils Relative %: 66 % (ref 43–77)
PLATELETS: 419 10*3/uL — AB (ref 150–400)
RBC: 4.85 MIL/uL (ref 3.87–5.11)
RDW: 15.9 % — ABNORMAL HIGH (ref 11.5–15.5)
WBC: 12.5 10*3/uL — AB (ref 4.0–10.5)

## 2015-04-22 LAB — IRON AND TIBC
IRON: 16 ug/dL — AB (ref 28–170)
Saturation Ratios: 3 % — ABNORMAL LOW (ref 10.4–31.8)
TIBC: 486 ug/dL — ABNORMAL HIGH (ref 250–450)
UIBC: 470 ug/dL

## 2015-04-22 LAB — FERRITIN: Ferritin: 9 ng/mL — ABNORMAL LOW (ref 11–307)

## 2015-04-22 NOTE — Progress Notes (Signed)
LABS DRAWN

## 2015-04-22 NOTE — Patient Instructions (Signed)
[place] at [facility] Discharge Instructions  RECOMMENDATIONS MADE BY THE CONSULTANT AND ANY TEST RESULTS WILL BE SENT TO YOUR REFERRING PHYSICIAN.  Lab work every 6 weeks. MD appointment in 4 months. Return as scheduled.  Thank you for choosing [place] at [facility] to provide your oncology and hematology care.  To afford each patient quality time with our provider, please arrive at least 15 minutes before your scheduled appointment time.    You need to re-schedule your appointment should you arrive 10 or more minutes late.  We strive to give you quality time with our providers, and arriving late affects you and other patients whose appointments are after yours.  Also, if you no show three or more times for appointments you may be dismissed from the clinic at the providers discretion.     Again, thank you for choosing [facility].  Our hope is that these requests will decrease the amount of time that you wait before being seen by our physicians.       _____________________________________________________________  Should you have questions after your visit to [facility], please contact our office at [phone] between the hours of 8:30 a.m. and 4:30 p.m.  Voicemails left after 4:30 p.m. will not be returned until the following business day.  For prescription refill requests, have your pharmacy contact our office.

## 2015-04-22 NOTE — Progress Notes (Signed)
[name], MD [place] [address]    DIAGNOSIS:  Iron deficiency anemia due to chronic blood loss Ferritin on 03/22/2014 at 11 ng/ml Colonoscopy 09/07/2009 with severe diverticulosis in the sigmoid to descending, sessile polyp  Postoperative MRSA infection of lower abdominal wound, sequela Exploratory laparotomy, sigmoid colectomy with end colostomy with Dr. [name], secondary to a perforated sigmoid diverticulitis.  OBSTRUCTIVE SLEEP APNEA  Thrombocytosis, reactive  CURRENT THERAPY: IV iron last given on 01/31/2015  INTERVAL HISTORY: [name] 74 y.o. female returns for follow-up of her iron deficiency. She denies any BRBPR or black, tarry stool. No hematuria.   [name] is here alone today. She presents in a wheelchair. The first few times she had iron infusions, she did not feel much different after. With her most recent iron infusions, she does notice a difference in her energy levels. A couple weeks ago, she began to have issues with her mouth which have led to difficulty eating. She believes it may be a "fungus". She has not seen an ENT doctor about it yet, she has seen her primary care doctor, Dr. Hilma Favors. She had dry mouth a lot before this started and now chews gum often to keep her mouth moist. She has noticed her mouth getting better. She was taken off of her arthritis medication, having joint pain ever since.  She has chronic hip pain that she does not want surgery for.   MEDICAL HISTORY: Past Medical History  Diagnosis Date  . Hypertension   . Diabetes mellitus   . Arthritis   . Anemia   . Diverticulitis   . Shortness of breath     with exertion   . Sleep apnea     cpap at 10   . Bronchitis 05/2014  . Cellulitis of lower extremity 06/2014    bilateral  . Tubular adenoma of colon   . Gallstone   . MRSA (methicillin resistant Staphylococcus aureus)     has now "tested negative"- no open wounds as of 02-10-15  . COPD  (chronic obstructive pulmonary disease)   . Obesity   . Sleep apnea     has OBSTRUCTIVE SLEEP APNEA; Diverticulitis of colon with perforation sigmoid colectomy and colostomy 1/15; Hypertension; Diabetes mellitus; Obesity, Class III, BMI 40-49.9 (morbid obesity); S/P colon resection; Draining postoperative wound; Cellulitis; Incisional hernia without mention of obstruction or gangrene; Thrombocytosis; Postoperative MRSA infection of lower abdominal wound; Iron deficiency anemia due to chronic blood loss; COPD exacerbation; Hypoxia; Leukocytosis; and IDA (iron deficiency anemia) on her problem list.     is allergic to adhesive; vancomycin; and zosyn.  Ms. Granville does not currently have medications on file.  SURGICAL HISTORY: Past Surgical History  Procedure Laterality Date  . Appendectomy    . Back surgery    . Colon resection  10/09/2011    Procedure: COLON RESECTION;  Surgeon: Gayland Curry, MD;  Location: [facility];  Service: General;  Laterality: N/A;  Colon Resection with colostomy  . Jackson pratt    . Application of wound vac  wound vac  . Abdominal hysterectomy  1979  . Proctoscopy  06/26/2012    Procedure: PROCTOSCOPY;  Surgeon: Gayland Curry, MD,FACS;  Location: [facility];  Service: General;  Laterality: N/A;  Rigid Proctoscopy  . Colon resection  06/26/2012    Procedure: COLON RESECTION;  Surgeon: Gayland Curry, MD,FACS;  Location: [facility];  Service: General;  Laterality: N/A;  . Application of wound vac  06/26/2012    Procedure: APPLICATION OF WOUND VAC;  Surgeon: Gayland Curry, MD,FACS;  Location: [facility];  Service: General;;  . Abdominal hysterectomy  1978  . Esophagogastroduodenoscopy (egd) with propofol N/A 02/17/2015    Procedure: ESOPHAGOGASTRODUODENOSCOPY (EGD) WITH PROPOFOL;  Surgeon: Jerene Bears, MD;  Location: [facility];  Service: Gastroenterology;  Laterality: N/A;    SOCIAL HISTORY: Social History   Social History  . Marital Status: Married    Spouse Name: N/A  .  Number of Children: 0  . Years of Education: N/A   Occupational History  . Retired    Social History Main Topics  . Smoking status: Former Smoker    Quit date: 09/24/2010  . Smokeless tobacco: Never Used  . Alcohol Use: No  . Drug Use: No  . Sexual Activity: Not Currently    Birth Control/ Protection: Surgical   Other Topics Concern  . Not on file   Social History Narrative  No children   FAMILY HISTORY: Family History  Problem Relation Age of Onset  . Heart disease Father   . Diabetes Father   . Hypertension Father   . Heart disease Brother   . Diabetes Brother   . Diabetes Mother   . Hypertension Mother   . Diabetes Sister   . Colon cancer Neg Hx   . Colon polyps Neg Hx   . Esophageal cancer Neg Hx   . Kidney disease Neg Hx   . Gallbladder disease Neg Hx     Review of Systems  Constitutional: Negative.   HENT: Positive for dry mouth.   Eyes:       Wears glasses  Respiratory: Negative.        SOB with exertion  Cardiovascular: Negative.   Gastrointestinal: Negative.   Genitourinary: Negative.   Musculoskeletal: Positive for joint pain and hip pain.      Bad right knee Uses walker at home  Skin: Negative.   Neurological: Negative.   Endo/Heme/Allergies: Negative.   Psychiatric/Behavioral: Negative.   14 point review of systems was performed and is negative except as detailed under history of present illness and above   PHYSICAL EXAMINATION ECOG PERFORMANCE STATUS: 1 - Symptomatic but completely ambulatory  Filed Vitals:   04/22/15 1301  BP: 128/60  Pulse: 81  Temp: 98.2 F (36.8 C)  Resp: 20   Physical Exam  Constitutional: She is oriented to person, place, and time and well-developed, well-nourished, and in no distress. In wheelchair HENT:  Head: Normocephalic and atraumatic.  Nose: Nose normal.  Mouth/Throat: Oropharynx is clear and moist. No oropharyngeal exudate.  Eyes: Conjunctivae and EOM are normal. Pupils are equal, round, and  reactive to light. Right eye exhibits no discharge. Left eye exhibits no discharge. No scleral icterus.  Neck: Normal range of motion. Neck supple. No tracheal deviation present. No thyromegaly present.  Cardiovascular: Normal rate, regular rhythm and normal heart sounds.  Exam reveals no gallop and no friction rub.   No murmur heard. Pulmonary/Chest: Effort normal and breath sounds normal. She has no wheezes. She has no rales.  Abdominal: Soft. Bowel sounds are normal. She exhibits no distension and no mass. There is no tenderness. There is no rebound and no guarding. EXAM performed with patient in the upright position Musculoskeletal: Normal range of motion. She exhibits no edema.   Lymphadenopathy:    She has no cervical adenopathy.  Neurological: She is alert and oriented to person, place, and time.No cranial nerve deficit.  Skin: Skin is  warm and dry. No rash noted.  Psychiatric: Mood, memory, affect and judgment normal.  Nursing note and vitals reviewed.   LABORATORY DATA:  CBC    Component Value Date/Time   WBC 12.5* 04/22/2015 1308   RBC 4.85 04/22/2015 1308   HGB 11.2* 04/22/2015 1308   HCT 38.3 04/22/2015 1308   PLT 419* 04/22/2015 1308   MCV 79.0 04/22/2015 1308   MCH 23.1* 04/22/2015 1308   MCHC 29.2* 04/22/2015 1308   RDW 15.9* 04/22/2015 1308   LYMPHSABS 3.0 04/22/2015 1308   MONOABS 0.9 04/22/2015 1308   EOSABS 0.3 04/22/2015 1308   BASOSABS 0.1 04/22/2015 1308   CMP     Component Value Date/Time   NA 139 01/21/2015 1430   K 3.4* 01/21/2015 1430   CL 98 01/21/2015 1430   CO2 30 01/21/2015 1430   GLUCOSE 149* 01/21/2015 1430   BUN 20 01/21/2015 1430   CREATININE 0.83 01/21/2015 1430   CALCIUM 9.1 01/21/2015 1430   PROT 7.2 01/21/2015 1430   ALBUMIN 3.5 01/21/2015 1430   AST 16 01/21/2015 1430   ALT 18 01/21/2015 1430   ALKPHOS 79 01/21/2015 1430   BILITOT 0.8 01/21/2015 1430   GFRNONAA 67* 01/21/2015 1430   GFRAA 78* 01/21/2015 1430     ASSESSMENT  and THERAPY PLAN:   Iron deficiency anemia due to chronic blood loss Ferritin on 03/22/2014 at 11 ng/ml Colonoscopy 09/07/2009 with severe diverticulosis in the sigmoid to descending, sessile polyp EGD 02/17/2015 with gastritis, obtained biopsies all negative   We will call Ms. Placzek next week with her blood results and let her know if she needs an iron infusion. We will decide whether a referral back to GI would be recommended based on today's blood counts and iron level. She will repeat labs every 6 weeks and see [place] back in 4 months for a follow up.  Orders Placed This Encounter  Procedures  . CBC with Differential    Standing Status: Standing     Number of Occurrences: 20     Standing Expiration Date: 04/21/2016  . Ferritin    Standing Status: Standing     Number of Occurrences: 20     Standing Expiration Date: 04/21/2016    All questions were answered. The patient knows to call the clinic with any problems, questions or concerns. We can certainly see the patient much sooner if necessary.  This document serves as a record of services personally performed by Ancil Linsey, MD. It was created on her behalf by Arlyce Harman, a trained medical scribe. The creation of this record is based on the scribe's personal observations and the provider's statements to them. This document has been checked and approved by the attending provider.  I have reviewed the above documentation for accuracy and completeness, and I agree with the above.  Molli Hazard 05/20/2015

## 2015-04-25 ENCOUNTER — Other Ambulatory Visit (HOSPITAL_COMMUNITY): Payer: Self-pay | Admitting: Oncology

## 2015-04-25 DIAGNOSIS — D500 Iron deficiency anemia secondary to blood loss (chronic): Secondary | ICD-10-CM

## 2015-04-28 ENCOUNTER — Encounter (HOSPITAL_COMMUNITY): Payer: Medicare Other | Attending: Hematology & Oncology

## 2015-04-28 VITALS — BP 149/58 | HR 78 | Temp 97.7°F | Resp 20

## 2015-04-28 DIAGNOSIS — D500 Iron deficiency anemia secondary to blood loss (chronic): Secondary | ICD-10-CM

## 2015-04-28 MED ORDER — SODIUM CHLORIDE 0.9 % IV SOLN
510.0000 mg | Freq: Once | INTRAVENOUS | Status: AC
  Administered 2015-04-28: 510 mg via INTRAVENOUS
  Filled 2015-04-28: qty 17

## 2015-04-28 MED ORDER — SODIUM CHLORIDE 0.9 % IV SOLN
INTRAVENOUS | Status: DC
  Administered 2015-04-28: 15:00:00 via INTRAVENOUS

## 2015-04-28 MED ORDER — SODIUM CHLORIDE 0.9 % IJ SOLN
10.0000 mL | Freq: Once | INTRAMUSCULAR | Status: AC
  Administered 2015-04-28: 10 mL via INTRAVENOUS

## 2015-04-28 NOTE — Progress Notes (Signed)
Tolerated iron infusion well. 

## 2015-04-28 NOTE — Patient Instructions (Signed)
[facility] at [facility] Discharge Instructions  RECOMMENDATIONS MADE BY THE CONSULTANT AND ANY TEST RESULTS WILL BE SENT TO YOUR REFERRING PHYSICIAN.  Feraheme 510 mg iron infusion given today as ordered. Return as scheduled.  Thank you for choosing  [facility] at [facility] to provide your oncology and hematology care.  To afford each patient quality time with our provider, please arrive at least 15 minutes before your scheduled appointment time.    You need to re-schedule your appointment should you arrive 10 or more minutes late.  We strive to give you quality time with our providers, and arriving late affects you and other patients whose appointments are after yours.  Also, if you no show three or more times for appointments you may be dismissed from the clinic at the providers discretion.     Again, thank you for choosing [facility].  Our hope is that these requests will decrease the amount of time that you wait before being seen by our physicians.       _____________________________________________________________  Should you have questions after your visit to  Cancer Center, please contact our office at [phone] between the hours of 8:30 a.m. and 4:30 p.m.  Voicemails left after 4:30 p.m. will not be returned until the following business day.  For prescription refill requests, have your pharmacy contact our office.    

## 2015-05-05 ENCOUNTER — Ambulatory Visit (HOSPITAL_COMMUNITY): Payer: Medicare Other

## 2015-05-09 ENCOUNTER — Encounter (HOSPITAL_BASED_OUTPATIENT_CLINIC_OR_DEPARTMENT_OTHER): Payer: Medicare Other

## 2015-05-09 ENCOUNTER — Encounter (HOSPITAL_COMMUNITY): Payer: Self-pay

## 2015-05-09 VITALS — BP 144/63 | HR 78 | Temp 98.2°F | Resp 20

## 2015-05-09 DIAGNOSIS — D500 Iron deficiency anemia secondary to blood loss (chronic): Secondary | ICD-10-CM

## 2015-05-09 MED ORDER — SODIUM CHLORIDE 0.9 % IV SOLN
510.0000 mg | Freq: Once | INTRAVENOUS | Status: AC
  Administered 2015-05-09: 510 mg via INTRAVENOUS
  Filled 2015-05-09: qty 17

## 2015-05-09 MED ORDER — SODIUM CHLORIDE 0.9 % IV SOLN
INTRAVENOUS | Status: DC
  Administered 2015-05-09: 15:00:00 via INTRAVENOUS

## 2015-05-09 NOTE — Patient Instructions (Signed)
[facility] at [facility] Discharge Instructions  RECOMMENDATIONS MADE BY THE CONSULTANT AND ANY TEST RESULTS WILL BE SENT TO YOUR REFERRING PHYSICIAN.  Iron infusion today Follow up as scheduled Please call the clinic if you have any questions or concerns  Thank you for choosing [facility] at [facility] to provide your oncology and hematology care.  To afford each patient quality time with our provider, please arrive at least 15 minutes before your scheduled appointment time.    You need to re-schedule your appointment should you arrive 10 or more minutes late.  We strive to give you quality time with our providers, and arriving late affects you and other patients whose appointments are after yours.  Also, if you no show three or more times for appointments you may be dismissed from the clinic at the providers discretion.     Again, thank you for choosing [facility].  Our hope is that these requests will decrease the amount of time that you wait before being seen by our physicians.       _____________________________________________________________  Should you have questions after your visit to [facility], please contact our office at [phone] between the hours of 8:30 a.m. and 4:30 p.m.  Voicemails left after 4:30 p.m. will not be returned until the following business day.  For prescription refill requests, have your pharmacy contact our office.    

## 2015-05-09 NOTE — Progress Notes (Signed)
[name] Tolerated iron infusion well Discharged ambulatory

## 2015-05-24 ENCOUNTER — Other Ambulatory Visit (HOSPITAL_COMMUNITY): Payer: Self-pay

## 2015-06-02 ENCOUNTER — Encounter (HOSPITAL_COMMUNITY): Payer: Medicare Other | Attending: Hematology & Oncology

## 2015-06-02 DIAGNOSIS — D473 Essential (hemorrhagic) thrombocythemia: Secondary | ICD-10-CM | POA: Insufficient documentation

## 2015-06-02 DIAGNOSIS — D72829 Elevated white blood cell count, unspecified: Secondary | ICD-10-CM | POA: Insufficient documentation

## 2015-06-02 DIAGNOSIS — D500 Iron deficiency anemia secondary to blood loss (chronic): Secondary | ICD-10-CM

## 2015-06-02 LAB — CBC WITH DIFFERENTIAL/PLATELET
BASOS ABS: 0.1 10*3/uL (ref 0.0–0.1)
Basophils Relative: 1 % (ref 0–1)
Eosinophils Absolute: 0.6 10*3/uL (ref 0.0–0.7)
Eosinophils Relative: 5 % (ref 0–5)
HCT: 41.4 % (ref 36.0–46.0)
HEMOGLOBIN: 12.1 g/dL (ref 12.0–15.0)
Lymphocytes Relative: 19 % (ref 12–46)
Lymphs Abs: 2.1 10*3/uL (ref 0.7–4.0)
MCH: 24.5 pg — AB (ref 26.0–34.0)
MCHC: 29.2 g/dL — ABNORMAL LOW (ref 30.0–36.0)
MCV: 84 fL (ref 78.0–100.0)
MONO ABS: 0.7 10*3/uL (ref 0.1–1.0)
MONOS PCT: 6 % (ref 3–12)
NEUTROS ABS: 7.9 10*3/uL — AB (ref 1.7–7.7)
Neutrophils Relative %: 69 % (ref 43–77)
PLATELETS: 344 10*3/uL (ref 150–400)
RBC: 4.93 MIL/uL (ref 3.87–5.11)
RDW: 20.8 % — ABNORMAL HIGH (ref 11.5–15.5)
WBC: 11.4 10*3/uL — ABNORMAL HIGH (ref 4.0–10.5)

## 2015-06-02 LAB — FERRITIN: FERRITIN: 29 ng/mL (ref 11–307)

## 2015-06-02 NOTE — Progress Notes (Signed)
Labs drawn

## 2015-06-03 ENCOUNTER — Encounter (HOSPITAL_COMMUNITY): Payer: Medicare Other

## 2015-06-09 ENCOUNTER — Encounter (HOSPITAL_BASED_OUTPATIENT_CLINIC_OR_DEPARTMENT_OTHER): Payer: Medicare Other

## 2015-06-09 ENCOUNTER — Encounter (HOSPITAL_COMMUNITY): Payer: Self-pay

## 2015-06-09 DIAGNOSIS — D500 Iron deficiency anemia secondary to blood loss (chronic): Secondary | ICD-10-CM | POA: Diagnosis not present

## 2015-06-09 MED ORDER — SODIUM CHLORIDE 0.9 % IV SOLN
510.0000 mg | Freq: Once | INTRAVENOUS | Status: AC
  Administered 2015-06-09: 510 mg via INTRAVENOUS
  Filled 2015-06-09: qty 17

## 2015-06-09 MED ORDER — SODIUM CHLORIDE 0.9 % IV SOLN
INTRAVENOUS | Status: DC
  Administered 2015-06-09: 12:00:00 via INTRAVENOUS

## 2015-06-09 NOTE — Patient Instructions (Signed)
[facility] at [facility] Discharge Instructions  RECOMMENDATIONS MADE BY THE CONSULTANT AND ANY TEST RESULTS WILL BE SENT TO YOUR REFERRING PHYSICIAN.  Iron infusion today Return as scheduled Please call the clinic if you have any questions or concerns  Thank you for choosing [facility] at [facility] to provide your oncology and hematology care.  To afford each patient quality time with our provider, please arrive at least 15 minutes before your scheduled appointment time.    You need to re-schedule your appointment should you arrive 10 or more minutes late.  We strive to give you quality time with our providers, and arriving late affects you and other patients whose appointments are after yours.  Also, if you no show three or more times for appointments you may be dismissed from the clinic at the providers discretion.     Again, thank you for choosing [facility].  Our hope is that these requests will decrease the amount of time that you wait before being seen by our physicians.       _____________________________________________________________  Should you have questions after your visit to [facility], please contact our office at [phone] between the hours of 8:30 a.m. and 4:30 p.m.  Voicemails left after 4:30 p.m. will not be returned until the following business day.  For prescription refill requests, have your pharmacy contact our office.     

## 2015-06-09 NOTE — Progress Notes (Signed)
[name] Tolerated iron infusion well today Discharged ambulatory

## 2015-07-01 NOTE — Telephone Encounter (Signed)
Called pt. Re: missed appointment.  Pt not feeling well  .ci

## 2015-07-14 ENCOUNTER — Encounter (HOSPITAL_COMMUNITY): Payer: Medicare Other | Attending: Hematology & Oncology

## 2015-07-14 DIAGNOSIS — D473 Essential (hemorrhagic) thrombocythemia: Secondary | ICD-10-CM | POA: Diagnosis not present

## 2015-07-14 DIAGNOSIS — D72829 Elevated white blood cell count, unspecified: Secondary | ICD-10-CM | POA: Diagnosis not present

## 2015-07-14 DIAGNOSIS — D500 Iron deficiency anemia secondary to blood loss (chronic): Secondary | ICD-10-CM | POA: Diagnosis not present

## 2015-07-14 LAB — CBC WITH DIFFERENTIAL/PLATELET
BASOS ABS: 0.1 10*3/uL (ref 0.0–0.1)
Basophils Relative: 1 %
EOS ABS: 0.7 10*3/uL (ref 0.0–0.7)
EOS PCT: 5 %
HCT: 43.1 % (ref 36.0–46.0)
Hemoglobin: 13 g/dL (ref 12.0–15.0)
Lymphocytes Relative: 18 %
Lymphs Abs: 2.4 10*3/uL (ref 0.7–4.0)
MCH: 24.3 pg — ABNORMAL LOW (ref 26.0–34.0)
MCHC: 30.2 g/dL (ref 30.0–36.0)
MCV: 80.4 fL (ref 78.0–100.0)
MONO ABS: 0.9 10*3/uL (ref 0.1–1.0)
MONOS PCT: 7 %
NEUTROS ABS: 9.2 10*3/uL — AB (ref 1.7–7.7)
Neutrophils Relative %: 69 %
Platelets: 339 10*3/uL (ref 150–400)
RBC: 5.36 MIL/uL — ABNORMAL HIGH (ref 3.87–5.11)
RDW: 19.3 % — AB (ref 11.5–15.5)
WBC: 13.2 10*3/uL — ABNORMAL HIGH (ref 4.0–10.5)

## 2015-07-14 LAB — FERRITIN: Ferritin: 19 ng/mL (ref 11–307)

## 2015-07-15 ENCOUNTER — Encounter (HOSPITAL_COMMUNITY): Payer: Medicare Other

## 2015-07-15 ENCOUNTER — Other Ambulatory Visit (HOSPITAL_COMMUNITY): Payer: Self-pay | Admitting: Hematology & Oncology

## 2015-07-15 NOTE — Progress Notes (Signed)
LABS DRAWN

## 2015-07-25 ENCOUNTER — Encounter (HOSPITAL_BASED_OUTPATIENT_CLINIC_OR_DEPARTMENT_OTHER): Payer: Medicare Other

## 2015-07-25 ENCOUNTER — Encounter (HOSPITAL_COMMUNITY): Payer: Self-pay

## 2015-07-25 VITALS — BP 121/89 | HR 75 | Temp 98.2°F | Resp 22

## 2015-07-25 DIAGNOSIS — D500 Iron deficiency anemia secondary to blood loss (chronic): Secondary | ICD-10-CM

## 2015-07-25 MED ORDER — SODIUM CHLORIDE 0.9 % IV SOLN
INTRAVENOUS | Status: DC
  Administered 2015-07-25: 14:00:00 via INTRAVENOUS

## 2015-07-25 MED ORDER — SODIUM CHLORIDE 0.9 % IV SOLN
510.0000 mg | Freq: Once | INTRAVENOUS | Status: AC
  Administered 2015-07-25: 510 mg via INTRAVENOUS
  Filled 2015-07-25: qty 17

## 2015-07-25 NOTE — Progress Notes (Signed)
1415:  Tolerated infusion w/o adverse reaction.  A&Ox4; in no distress.  VSS.  Discharged via wheelchair in c/o spouse.

## 2015-07-25 NOTE — Patient Instructions (Signed)
[facility] at [facility] Discharge Instructions  RECOMMENDATIONS MADE BY THE CONSULTANT AND ANY TEST RESULTS WILL BE SENT TO YOUR REFERRING PHYSICIAN.  Iron infusion today. Return as scheduled for lab work and office visit.    Thank you for choosing [facility] at [facility] to provide your oncology and hematology care.  To afford each patient quality time with our provider, please arrive at least 15 minutes before your scheduled appointment time.    You need to re-schedule your appointment should you arrive 10 or more minutes late.  We strive to give you quality time with our providers, and arriving late affects you and other patients whose appointments are after yours.  Also, if you no show three or more times for appointments you may be dismissed from the clinic at the providers discretion.     Again, thank you for choosing [facility].  Our hope is that these requests will decrease the amount of time that you wait before being seen by our physicians.       _____________________________________________________________  Should you have questions after your visit to  Cancer Center, please contact our office at [phone] between the hours of 8:30 a.m. and 4:30 p.m.  Voicemails left after 4:30 p.m. will not be returned until the following business day.  For prescription refill requests, have your pharmacy contact our office.    

## 2015-08-03 ENCOUNTER — Encounter: Payer: Self-pay | Admitting: Podiatry

## 2015-08-03 ENCOUNTER — Ambulatory Visit (INDEPENDENT_AMBULATORY_CARE_PROVIDER_SITE_OTHER): Payer: Medicare Other | Admitting: Podiatry

## 2015-08-03 DIAGNOSIS — E1140 Type 2 diabetes mellitus with diabetic neuropathy, unspecified: Secondary | ICD-10-CM | POA: Diagnosis not present

## 2015-08-03 DIAGNOSIS — B351 Tinea unguium: Secondary | ICD-10-CM | POA: Diagnosis not present

## 2015-08-03 DIAGNOSIS — M79676 Pain in unspecified toe(s): Secondary | ICD-10-CM | POA: Diagnosis not present

## 2015-08-03 NOTE — Progress Notes (Signed)
Patient ID: [name], female   DOB: 1941/05/25, 74 y.o.   MRN: [id number] Complaint:  Visit Type: Patient returns to my office for continued preventative foot care services. Complaint: Patient states" my nails have grown long and thick and become painful to walk and wear shoes" Patient has been diagnosed with DM with no foot complications. The patient presents for preventative foot care services. No changes to ROS  Podiatric Exam: Vascular: dorsalis pedis and posterior tibial pulses are palpable bilateral. Capillary return is immediate. Temperature gradient is WNL. Skin turgor WNL  Sensorium: Diminished  Semmes Weinstein monofilament test. Normal tactile sensation bilaterally. Nail Exam: Pt has thick disfigured discolored nails with subungual debris noted bilateral entire nail hallux through fifth toenails Ulcer Exam: There is no evidence of ulcer or pre-ulcerative changes or infection. Orthopedic Exam: Muscle tone and strength are WNL. No limitations in general ROM. No crepitus or effusions noted. Foot type and digits show no abnormalities. Bony prominences are unremarkable. Skin: No Porokeratosis. No infection or ulcers  Diagnosis:  Onychomycosis, , Pain in right toe, pain in left toes  Treatment & Plan Procedures and Treatment: Consent by patient was obtained for treatment procedures. The patient understood the discussion of treatment and procedures well. All questions were answered thoroughly reviewed. Debridement of mycotic and hypertrophic toenails, 1 through 5 bilateral and clearing of subungual debris. No ulceration, no infection noted.  Return Visit-Office Procedure: Patient instructed to return to the office for a follow up visit 3 months for continued evaluation and treatment.

## 2015-08-11 ENCOUNTER — Ambulatory Visit (HOSPITAL_COMMUNITY): Payer: Medicare Other | Admitting: Oncology

## 2015-08-25 ENCOUNTER — Encounter (HOSPITAL_COMMUNITY): Payer: Medicare Other | Attending: Hematology & Oncology | Admitting: Oncology

## 2015-08-25 ENCOUNTER — Encounter (HOSPITAL_BASED_OUTPATIENT_CLINIC_OR_DEPARTMENT_OTHER): Payer: Medicare Other

## 2015-08-25 ENCOUNTER — Encounter (HOSPITAL_COMMUNITY): Payer: Self-pay | Admitting: Oncology

## 2015-08-25 VITALS — BP 142/68 | HR 74 | Temp 98.2°F | Resp 22

## 2015-08-25 DIAGNOSIS — D473 Essential (hemorrhagic) thrombocythemia: Secondary | ICD-10-CM | POA: Diagnosis not present

## 2015-08-25 DIAGNOSIS — D500 Iron deficiency anemia secondary to blood loss (chronic): Secondary | ICD-10-CM | POA: Diagnosis not present

## 2015-08-25 DIAGNOSIS — D72829 Elevated white blood cell count, unspecified: Secondary | ICD-10-CM | POA: Diagnosis not present

## 2015-08-25 LAB — CBC WITH DIFFERENTIAL/PLATELET
Basophils Absolute: 0.1 10*3/uL (ref 0.0–0.1)
Basophils Relative: 0 %
EOS ABS: 0.4 10*3/uL (ref 0.0–0.7)
Eosinophils Relative: 3 %
HEMATOCRIT: 46.4 % — AB (ref 36.0–46.0)
HEMOGLOBIN: 14.2 g/dL (ref 12.0–15.0)
LYMPHS ABS: 2.4 10*3/uL (ref 0.7–4.0)
Lymphocytes Relative: 17 %
MCH: 24.9 pg — AB (ref 26.0–34.0)
MCHC: 30.6 g/dL (ref 30.0–36.0)
MCV: 81.3 fL (ref 78.0–100.0)
MONOS PCT: 7 %
Monocytes Absolute: 0.9 10*3/uL (ref 0.1–1.0)
NEUTROS ABS: 9.8 10*3/uL — AB (ref 1.7–7.7)
NEUTROS PCT: 73 %
Platelets: 301 10*3/uL (ref 150–400)
RBC: 5.71 MIL/uL — ABNORMAL HIGH (ref 3.87–5.11)
RDW: 20.5 % — ABNORMAL HIGH (ref 11.5–15.5)
WBC: 13.5 10*3/uL — ABNORMAL HIGH (ref 4.0–10.5)

## 2015-08-25 LAB — FERRITIN: Ferritin: 23 ng/mL (ref 11–307)

## 2015-08-25 NOTE — Progress Notes (Signed)
LABS DRAWN

## 2015-08-25 NOTE — Progress Notes (Signed)
[name], MD [name] [id number]  Iron deficiency anemia due to chronic blood loss  CURRENT THERAPY: IV iron replacement  Oncology Flowsheet 04/28/2015 05/09/2015 06/09/2015 07/25/2015  ferumoxytol (FERAHEME) IV 510 mg 510 mg 510 mg 510 mg    INTERVAL HISTORY: [name] 74 y.o. female returns for followup of iron deficiency anemia.  I personally reviewed and went over laboratory results with the patient.  The results are noted within this dictation.  Unfortunately, her best friend passed away and the funeral is today.  She is in a hurry today to get out of the clinic in order to make the funeral in a timely manner.    She denies any blood in her stool, dark stool, hematuria, vaginal bleeding, etc.  Past Medical History  Diagnosis Date  . Hypertension   . Diabetes mellitus   . Arthritis   . Anemia   . Diverticulitis   . Shortness of breath     with exertion   . Sleep apnea     cpap at 10   . Bronchitis 05/2014  . Cellulitis of lower extremity 06/2014    bilateral  . Tubular adenoma of colon   . Gallstone   . MRSA (methicillin resistant Staphylococcus aureus)     has now "tested negative"- no open wounds as of 02-10-15  . COPD (chronic obstructive pulmonary disease) ([place])   . Obesity   . Sleep apnea   . 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     is allergic to adhesive; vancomycin; and  zosyn.  Current Outpatient Prescriptions on File Prior to Visit  Medication Sig Dispense Refill  . albuterol (PROVENTIL HFA;VENTOLIN HFA) 108 (90 BASE) MCG/ACT inhaler Inhale 2 puffs into the lungs every 4 (four) hours as needed for wheezing. 1 Inhaler 3  . aspirin 81 MG tablet Take 162 mg by mouth daily. Take 2 tablets, by mouth, every day    . Calcium Carb-Cholecalciferol (CALCIUM 600 + D PO) Take 2 tablets by mouth daily.    . cyanocobalamin 1000 MCG tablet Take 1,000 mcg by mouth daily.    . fluticasone (FLOVENT HFA) 110 MCG/ACT inhaler Inhale 2 puffs into the lungs 2 (two) times daily. 1 Inhaler 12  . Glucosamine-Chondroitin-Vit D3 1500-1200-800 MG-MG-UNIT PACK Take 2 tablets by mouth daily.    Marland Kitchen guaiFENesin-dextromethorphan (ROBITUSSIN DM) 100-10 MG/5ML syrup Take 5 mLs by mouth every 4 (four) hours as needed for cough. 118 mL 0  . JANUVIA 100 MG tablet Take 100 mg by mouth daily.     . meloxicam (MOBIC) 7.5 MG tablet Take 7.5 mg by mouth daily.     . metFORMIN (GLUCOPHAGE) 500 MG tablet Take 1,000 mg by mouth 2 (two) times daily with a meal.     . montelukast (SINGULAIR) 10 MG tablet Take 10 mg by mouth daily.     . Q-DRYL 12.5 MG/5ML liquid     . traMADol (ULTRAM) 50 MG tablet Take 50 mg by mouth at bedtime as needed for moderate pain.     Marland Kitchen  furosemide (LASIX) 40 MG tablet Take 40 mg by mouth daily.     Marland Kitchen ipratropium-albuterol (DUONEB) 0.5-2.5 (3) MG/3ML SOLN Take 4 times daily for 2 days and then as needed for wheezing, shortness of breath. (Patient not taking: Reported on 08/25/2015) 360 mL 3  . pantoprazole (PROTONIX) 40 MG tablet Take 1 tablet (40 mg total) by mouth daily. (Patient not taking: Reported on 08/25/2015) 30 tablet 3   No current facility-administered medications on file prior to visit.    Past Surgical History  Procedure Laterality Date  . Appendectomy    . Back surgery    . Colon resection  10/09/2011    Procedure: COLON RESECTION;  Surgeon: Gayland Curry, MD;   Location: [facility];  Service: General;  Laterality: N/A;  Colon Resection with colostomy  . Jackson pratt    . Application of wound vac  wound vac  . Abdominal hysterectomy  1979  . Proctoscopy  06/26/2012    Procedure: PROCTOSCOPY;  Surgeon: Gayland Curry, MD,FACS;  Location: [facility];  Service: General;  Laterality: N/A;  Rigid Proctoscopy  . Colon resection  06/26/2012    Procedure: COLON RESECTION;  Surgeon: Gayland Curry, MD,FACS;  Location: [facility];  Service: General;  Laterality: N/A;  . Application of wound vac  06/26/2012    Procedure: APPLICATION OF WOUND VAC;  Surgeon: Gayland Curry, MD,FACS;  Location: [facility];  Service: General;;  . Abdominal hysterectomy  1978  . Esophagogastroduodenoscopy (egd) with propofol N/A 02/17/2015    Procedure: ESOPHAGOGASTRODUODENOSCOPY (EGD) WITH PROPOFOL;  Surgeon: Jerene Bears, MD;  Location: [facility];  Service: Gastroenterology;  Laterality: N/A;    Denies any headaches, dizziness, double vision, fevers, chills, night sweats, nausea, vomiting, diarrhea, constipation, chest pain, heart palpitations, shortness of breath, blood in stool, black tarry stool, urinary pain, urinary burning, urinary frequency, hematuria.   PHYSICAL EXAMINATION  ECOG PERFORMANCE STATUS: 2 - Symptomatic, <50% confined to bed  Filed Vitals:   08/25/15 1311  BP: 142/68  Pulse: 74  Temp: 98.2 F (36.8 C)  Resp: 22    GENERAL:alert, no distress, comfortable, cooperative, morbidly obese, smiling and in wheelchair and accompanied by her husband SKIN: skin color, texture, turgor are normal, no rashes or significant lesions HEAD: Normocephalic, No masses, lesions, tenderness or abnormalities EYES: normal, PERRLA, EOMI, Conjunctiva are pink and non-injected EARS: External ears normal OROPHARYNX:lips, buccal mucosa, and tongue normal and mucous membranes are moist  NECK: supple, no adenopathy, thyroid normal size, non-tender, without nodularity, trachea midline LYMPH:  no  palpable lymphadenopathy BREAST:not examined LUNGS: clear to auscultation  HEART: regular rate & rhythm ABDOMEN:abdomen soft, obese and normal bowel sounds BACK: Back symmetric, no curvature. EXTREMITIES:less then 2 second capillary refill, no skin discoloration, no cyanosis  NEURO: alert & oriented x 3 with fluent speech, in wheelchair  LABORATORY DATA: CBC    Component Value Date/Time   WBC 13.5* 08/25/2015 1325   RBC 5.71* 08/25/2015 1325   HGB 14.2 08/25/2015 1325   HCT 46.4* 08/25/2015 1325   PLT 301 08/25/2015 1325   MCV 81.3 08/25/2015 1325   MCH 24.9* 08/25/2015 1325   MCHC 30.6 08/25/2015 1325   RDW 20.5* 08/25/2015 1325   LYMPHSABS 2.4 08/25/2015 1325   MONOABS 0.9 08/25/2015 1325   EOSABS 0.4 08/25/2015 1325   BASOSABS 0.1 08/25/2015 1325      Chemistry      Component Value Date/Time   NA 139 01/21/2015 1430   K 3.4* 01/21/2015 1430  CL 98 01/21/2015 1430   CO2 30 01/21/2015 1430   BUN 20 01/21/2015 1430   CREATININE 0.83 01/21/2015 1430      Component Value Date/Time   CALCIUM 9.1 01/21/2015 1430   ALKPHOS 79 01/21/2015 1430   AST 16 01/21/2015 1430   ALT 18 01/21/2015 1430   BILITOT 0.8 01/21/2015 1430        PENDING LABS:   RADIOGRAPHIC STUDIES:  No results found.   PATHOLOGY:    ASSESSMENT AND PLAN:  Iron deficiency anemia due to chronic blood loss Iron deficiency anemia, requiring IV iron replacement  Labs every 6 weeks: CBC, ferritin   Return in 4 months for follow-up.  She knows to call us in the interim if she needs labs performed sooner than planned.    THERAPY PLAN:  Continue surveillance of labs and iron studies with IV iron replacement as indicated.  All questions were answered. The patient knows to call the clinic with any problems, questions or concerns. We can certainly see the patient much sooner if necessary.  Patient and plan discussed with Dr. Ancil Linsey and she is in agreement with the aforementioned.    This note is electronically signed by: Doy Mince 08/25/2015 4:59 PM

## 2015-08-25 NOTE — Patient Instructions (Signed)
[place] at [facility] Discharge Instructions  RECOMMENDATIONS MADE BY THE CONSULTANT AND ANY TEST RESULTS WILL BE SENT TO YOUR REFERRING PHYSICIAN.  Exam and discussion by Robynn Pane, PA-C Will let you know if any concerns with your labs. Call with increased fatigue or shortness of breath or other concerns.  Follow-up with labs and office visit in 4 months.  Thank you for choosing [place] at [facility] to provide your oncology and hematology care.  To afford each patient quality time with our provider, please arrive at least 15 minutes before your scheduled appointment time.    You need to re-schedule your appointment should you arrive 10 or more minutes late.  We strive to give you quality time with our providers, and arriving late affects you and other patients whose appointments are after yours.  Also, if you no show three or more times for appointments you may be dismissed from the clinic at the providers discretion.     Again, thank you for choosing [facility].  Our hope is that these requests will decrease the amount of time that you wait before being seen by our physicians.       _____________________________________________________________  Should you have questions after your visit to [facility], please contact our office at [phone] between the hours of 8:30 a.m. and 4:30 p.m.  Voicemails left after 4:30 p.m. will not be returned until the following business day.  For prescription refill requests, have your pharmacy contact our office.

## 2015-08-25 NOTE — Assessment & Plan Note (Signed)
Iron deficiency anemia, requiring IV iron replacement  Labs every 6 weeks: CBC, ferritin   Return in 4 months for follow-up.  She knows to call us in the interim if she needs labs performed sooner than planned.

## 2015-08-26 ENCOUNTER — Encounter (HOSPITAL_COMMUNITY): Payer: Medicare Other

## 2015-08-26 ENCOUNTER — Telehealth (HOSPITAL_COMMUNITY): Payer: Self-pay

## 2015-08-26 NOTE — Telephone Encounter (Signed)
Appointments scheduled and patient notified.

## 2015-08-26 NOTE — Telephone Encounter (Signed)
-----   Message from Baird Cancer, PA-C sent at 08/25/2015  4:52 PM EST ----- Looks like she has been getting labs every 6 weeks.  Let's schedule that and stay with labs every 6 weeks and office visit in 4 mo.  TK

## 2015-09-14 ENCOUNTER — Other Ambulatory Visit (HOSPITAL_COMMUNITY): Payer: Self-pay | Admitting: Hematology & Oncology

## 2015-09-15 ENCOUNTER — Encounter (HOSPITAL_BASED_OUTPATIENT_CLINIC_OR_DEPARTMENT_OTHER): Payer: Medicare Other

## 2015-09-15 ENCOUNTER — Encounter (HOSPITAL_COMMUNITY): Payer: Self-pay

## 2015-09-15 VITALS — BP 148/68 | HR 77 | Temp 97.2°F | Resp 20

## 2015-09-15 DIAGNOSIS — D500 Iron deficiency anemia secondary to blood loss (chronic): Secondary | ICD-10-CM

## 2015-09-15 MED ORDER — SODIUM CHLORIDE 0.9 % IV SOLN
Freq: Once | INTRAVENOUS | Status: AC
  Administered 2015-09-15: 13:00:00 via INTRAVENOUS

## 2015-09-15 MED ORDER — SODIUM CHLORIDE 0.9 % IJ SOLN
10.0000 mL | Freq: Once | INTRAMUSCULAR | Status: AC
  Administered 2015-09-15: 10 mL via INTRAVENOUS

## 2015-09-15 MED ORDER — SODIUM CHLORIDE 0.9 % IV SOLN
510.0000 mg | Freq: Once | INTRAVENOUS | Status: AC
  Administered 2015-09-15: 510 mg via INTRAVENOUS
  Filled 2015-09-15: qty 17

## 2015-09-15 NOTE — Patient Instructions (Signed)
[place] at [facility] Discharge Instructions  RECOMMENDATIONS MADE BY THE CONSULTANT AND ANY TEST RESULTS WILL BE SENT TO YOUR REFERRING PHYSICIAN.  You received IV iron today. Will see you at your next visit  Thank you for choosing [place] at [facility] to provide your oncology and hematology care.  To afford each patient quality time with our provider, please arrive at least 15 minutes before your scheduled appointment time.    You need to re-schedule your appointment should you arrive 10 or more minutes late.  We strive to give you quality time with our providers, and arriving late affects you and other patients whose appointments are after yours.  Also, if you no show three or more times for appointments you may be dismissed from the clinic at the providers discretion.     Again, thank you for choosing [facility].  Our hope is that these requests will decrease the amount of time that you wait before being seen by our physicians.       _____________________________________________________________  Should you have questions after your visit to [facility], please contact our office at [phone] between the hours of 8:30 a.m. and 4:30 p.m.  Voicemails left after 4:30 p.m. will not be returned until the following business day.  For prescription refill requests, have your pharmacy contact our office.

## 2015-09-15 NOTE — Progress Notes (Signed)
Patient tolerated iv iron today.

## 2015-10-06 ENCOUNTER — Encounter (HOSPITAL_COMMUNITY): Payer: Medicare Other

## 2015-10-12 ENCOUNTER — Other Ambulatory Visit: Payer: Self-pay | Admitting: Neurology

## 2015-10-12 ENCOUNTER — Ambulatory Visit (INDEPENDENT_AMBULATORY_CARE_PROVIDER_SITE_OTHER): Payer: Medicare Other | Admitting: Neurology

## 2015-10-12 ENCOUNTER — Other Ambulatory Visit (INDEPENDENT_AMBULATORY_CARE_PROVIDER_SITE_OTHER): Payer: Medicare Other

## 2015-10-12 ENCOUNTER — Encounter: Payer: Self-pay | Admitting: Neurology

## 2015-10-12 VITALS — BP 120/78 | HR 80 | Temp 98.2°F

## 2015-10-12 DIAGNOSIS — M791 Myalgia, unspecified site: Secondary | ICD-10-CM

## 2015-10-12 DIAGNOSIS — E039 Hypothyroidism, unspecified: Secondary | ICD-10-CM | POA: Diagnosis not present

## 2015-10-12 DIAGNOSIS — R292 Abnormal reflex: Secondary | ICD-10-CM

## 2015-10-12 DIAGNOSIS — R270 Ataxia, unspecified: Secondary | ICD-10-CM

## 2015-10-12 DIAGNOSIS — R29898 Other symptoms and signs involving the musculoskeletal system: Secondary | ICD-10-CM

## 2015-10-12 DIAGNOSIS — E1142 Type 2 diabetes mellitus with diabetic polyneuropathy: Secondary | ICD-10-CM

## 2015-10-12 LAB — TSH: TSH: 3.47 u[IU]/mL (ref 0.35–4.50)

## 2015-10-12 LAB — CK: CK TOTAL: 34 U/L (ref 7–177)

## 2015-10-12 NOTE — Patient Instructions (Signed)
1. We have scheduled you at [facility] for your OPEN MRI Brain on 10/18/2015 at 12:15 pm and OPEN MR Cervical Spine on 10/19/2015 at 1:00 pm. Please arrive 30 minutes prior to these testing times and go to [place]. If you need to reschedule for any reason please call [phone]. 2. Your provider has requested that you have labwork completed today. Please go to [facility] ([id number]) on the second floor of this building before leaving the office today. You do not need to check in. If you are not called within 15 minutes please check with the front desk.  3. We will call you to schedule your EMG.   ELECTROMYOGRAM AND NERVE CONDUCTION STUDIES (EMG/NCS) INSTRUCTIONS  How to Prepare The neurologist conducting the EMG will need to know if you have certain medical conditions. Tell the neurologist and other EMG lab personnel if you: . Have a pacemaker or any other electrical medical device . Take blood-thinning medications . Have hemophilia, a blood-clotting disorder that causes prolonged bleeding Bathing Take a shower or bath shortly before your exam in order to remove oils from your skin. Don't apply lotions or creams before the exam.  What to Expect You'll likely be asked to change into a hospital gown for the procedure and lie down on an examination table. The following explanations can help you understand what will happen during the exam.  . Electrodes. The neurologist or a technician places surface electrodes at various locations on your skin depending on where you're experiencing symptoms. Or the neurologist may insert needle electrodes at different sites depending on your symptoms.  . Sensations. The electrodes will at times transmit a tiny electrical current that you may feel as a twinge or spasm. The needle electrode may cause discomfort or pain that usually ends shortly after the needle is removed. If you are concerned about discomfort or pain, you may want to talk to the  neurologist about taking a short break during the exam.  . Instructions. During the needle EMG, the neurologist will assess whether there is any spontaneous electrical activity when the muscle is at rest - activity that isn't present in healthy muscle tissue - and the degree of activity when you slightly contract the muscle.  He or she will give you instructions on resting and contracting a muscle at appropriate times. Depending on what muscles and nerves the neurologist is examining, he or she may ask you to change positions during the exam.  After your EMG You may experience some temporary, minor bruising where the needle electrode was inserted into your muscle. This bruising should fade within several days. If it persists, contact your primary care doctor.

## 2015-10-12 NOTE — Progress Notes (Signed)
[name] was seen today in the movement disorders clinic for neurologic consultation at the request of GOLDING, Betsy Coder, MD.  The consultation is for the evaluation of neck weakness.  This patient is accompanied in the office by her spouse who supplements the history.  Pt states that her head feels heavy, "like a bowling ball" and it has been going on 4 months.  It is good when she first wakes up but within 15 mins of waking up, she has trouble holding the head up.  If she is not being conscious of holding the head up, the chin is on the chest.  She sometimes has to push the head up.  She has a "tired feeling" in the neck and pain but "nothing that I can't stand."  She has not been on steroids in a long time.  Denies cholesterol medications.  She denies swallowing problems but does have some drooling.  Uses the WC when she is out but not at home (holds onto furniture at home).  Synthroid is new to her, but this problem started before the synthroid was added.  Pt was worried about PD.  She has been seeing a chiropractor but no high velocity techniques done according to her.    Reports that she has poor balance.  Has a bad knee on the right after a fall a few years ago.  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  Not sure that stamina same after but didn't have head drop then.   ALLERGIES:   Allergies  Allergen Reactions  . Adhesive [Tape] Hives and Rash  . Codeine Itching and Other (See Comments)  . Vancomycin Rash  . Zosyn [Piperacillin Sod-Tazobactam So] Rash    CURRENT MEDICATIONS:  Outpatient Encounter Prescriptions as of 10/12/2015  Medication Sig  . albuterol (PROVENTIL HFA;VENTOLIN HFA) 108 (90 BASE) MCG/ACT inhaler Inhale 2 puffs into the lungs every 4 (four) hours as needed for wheezing.  Marland Kitchen aspirin 81 MG tablet Take 162 mg by mouth daily. Take 2 tablets, by mouth, every day  .  budesonide-formoterol (SYMBICORT) 160-4.5 MCG/ACT inhaler Inhale 2 puffs into the lungs 2 (two) times daily.  . Calcium Carb-Cholecalciferol (CALCIUM 600 + D PO) Take 2 tablets by mouth daily.  . cyanocobalamin 1000 MCG tablet Take 1,000 mcg by mouth daily.  . furosemide (LASIX) 40 MG tablet Take 40 mg by mouth daily.   . Glucosamine-Chondroitin-Vit D3 1500-1200-800 MG-MG-UNIT PACK Take 2 tablets by mouth daily.  Marland Kitchen ipratropium-albuterol (DUONEB) 0.5-2.5 (3) MG/3ML SOLN Take 4 times daily for 2 days and then as needed for wheezing, shortness of breath.  Marland Kitchen JANUVIA 100 MG tablet Take 100 mg by mouth daily.   Marland Kitchen levothyroxine (SYNTHROID, LEVOTHROID) 50 MCG tablet Take 50 mcg by mouth daily.  . meloxicam (MOBIC) 7.5 MG tablet Take 7.5 mg by mouth daily.   . metFORMIN (GLUCOPHAGE) 500 MG tablet Take 1,000 mg by mouth 2 (two) times daily with a meal.   . montelukast (SINGULAIR) 10 MG tablet Take 10 mg by mouth daily.   Marland Kitchen nystatin (MYCOSTATIN) 100000 UNIT/ML suspension Take by mouth. Swish and spit  . nystatin (MYCOSTATIN) powder Apply topically as needed.  . nystatin cream (MYCOSTATIN) Apply topically as needed.  . polyethylene glycol (MIRALAX / GLYCOLAX) packet Take 17 g by mouth daily.  . traMADol (ULTRAM) 50 MG tablet Take 50 mg by mouth at bedtime as needed for  moderate pain.   . Wheat Dextrin (BENEFIBER DRINK MIX PO) Take by mouth.  . [DISCONTINUED] fluticasone (FLOVENT HFA) 110 MCG/ACT inhaler Inhale 2 puffs into the lungs 2 (two) times daily.  . [DISCONTINUED] guaiFENesin-dextromethorphan (ROBITUSSIN DM) 100-10 MG/5ML syrup Take 5 mLs by mouth every 4 (four) hours as needed for cough.  . [DISCONTINUED] pantoprazole (PROTONIX) 40 MG tablet Take 1 tablet (40 mg total) by mouth daily. (Patient not taking: Reported on 08/25/2015)   No facility-administered encounter medications on file as of 10/12/2015.    PAST MEDICAL HISTORY:   Past Medical History  Diagnosis Date  . Hypertension   . Diabetes  mellitus   . Arthritis   . Anemia   . Diverticulitis   . Shortness of breath     with exertion   . Sleep apnea     cpap at 10   . Bronchitis 05/2014  . Cellulitis of lower extremity 06/2014    bilateral  . Tubular adenoma of colon   . Gallstone   . MRSA (methicillin resistant Staphylococcus aureus)     has now "tested negative"- no open wounds as of 02-10-15  . COPD (chronic obstructive pulmonary disease) ([place])   . Obesity   . Sleep apnea   . Thyroid disease     PAST SURGICAL HISTORY:   Past Surgical History  Procedure Laterality Date  . Appendectomy    . Back surgery    . Colon resection  10/09/2011    Procedure: COLON RESECTION;  Surgeon: Gayland Curry, MD;  Location: [facility];  Service: General;  Laterality: N/A;  Colon Resection with colostomy  . Jackson pratt    . Application of wound vac  wound vac  . Abdominal hysterectomy  1979  . Proctoscopy  06/26/2012    Procedure: PROCTOSCOPY;  Surgeon: Gayland Curry, MD,FACS;  Location: [facility];  Service: General;  Laterality: N/A;  Rigid Proctoscopy  . Colon resection  06/26/2012    Procedure: COLON RESECTION;  Surgeon: Gayland Curry, MD,FACS;  Location: [facility];  Service: General;  Laterality: N/A;  . Application of wound vac  06/26/2012    Procedure: APPLICATION OF WOUND VAC;  Surgeon: Gayland Curry, MD,FACS;  Location: [facility];  Service: General;;  . Abdominal hysterectomy  1978  . Esophagogastroduodenoscopy (egd) with propofol N/A 02/17/2015    Procedure: ESOPHAGOGASTRODUODENOSCOPY (EGD) WITH PROPOFOL;  Surgeon: Jerene Bears, MD;  Location: [facility];  Service: Gastroenterology;  Laterality: N/A;    SOCIAL HISTORY:   Social History   Social History  . Marital Status: Married    Spouse Name: N/A  . Number of Children: 0  . Years of Education: N/A   Occupational History  . Retired    Social History Main Topics  . Smoking status: Former Smoker    Quit date: 09/24/2010  . Smokeless tobacco: Never Used  . Alcohol Use: No    . Drug Use: No  . Sexual Activity: Not Currently    Birth Control/ Protection: Surgical   Other Topics Concern  . Not on file   Social History Narrative    FAMILY HISTORY:   Family Status  Relation Status Death Age  . Father Deceased     CHF  . Mother Deceased     complications of hip fx  . Brother Deceased     MVA  . Sister Deceased     DM    ROS:  Some SOB with COPD.  No lateralizing weakness or paresthesias.  Denies diplopia.  A complete 10 system review of systems was obtained and was unremarkable apart from what is mentioned above.  PHYSICAL EXAMINATION:    VITALS:   Filed Vitals:   10/12/15 1317  BP: 120/78  Pulse: 80  Temp: 98.2 F (36.8 C)  SpO2: 91%    GEN:  The patient appears stated age and is in NAD. HEENT:  Normocephalic, atraumatic.  The mucous membranes are moist. The superficial temporal arteries are without ropiness or tenderness.  No tongue fasciculations. CV:  RRR Lungs:  CTAB and has dyspnea on exertion Neck/HEME:  There are no carotid bruits bilaterally.  The neck is held in a flexed posture.  The chin is not on the chest until she goes to walk and then the chin is on the chest.  Neurological examination:  Orientation: The patient is alert and oriented x3. Fund of knowledge is appropriate.  Recent and remote memory are intact.  Attention and concentration are normal.    Able to name objects and repeat phrases. Cranial nerves: There is good facial symmetry. Pupils are equal round and reactive to light bilaterally. Fundoscopic exam is attempted but the disc margins are not well visualized (patient awaiting cataract surgery). Extraocular muscles are intact. The visual fields are full to confrontational testing. The speech is fluent and clear. Soft palate rises symmetrically and there is no tongue deviation. Hearing is intact to conversational tone. Sensation: Sensation is intact to light and pinprick throughout (facial, trunk, extremities). Vibration  is decreased at the bilateral big toe. There is no extinction with double simultaneous stimulation. There is no sensory dermatomal level identified. Motor: Strength is 5/5 in the bilateral upper and lower extremities.  Strength in the neck flexors is very good but neck extensors are weak with 3/5 strength.  Shoulder shrug is equal and symmetric.  There is no pronator drift.  There are no fasciculations. Deep tendon reflexes: Deep tendon reflexes are 2+/4 at the bilateral biceps, triceps, brachioradialis, patella and absent at the bilateral achilles.  She has a striatal toe on the left.  Plantar response is downgoing on the right.  Movement examination: Tone: There is normal tone in the bilateral upper extremities.  The tone in the lower extremities is normal.    Abnormal movements: None Coordination:  There is no decremation with RAM's, with any form of RAMS, including alternating supination and pronation of the forearm, hand opening and closing, finger taps, heel taps and toe taps. Gait and Station: The patient has difficulty arising out of the wheelchair and requires some assistance.  She was given a walker.  She is slow to ambulate and the chin is on the chest.  Ambulation is limited by dyspnea on exertion.  ASSESSMENT/PLAN:  1.  Dropped head syndrome (neck extensor weakness)  -The patient was worried that she had Parkinson's disease.  I saw no evidence of Parkinson's disease.  I do not see any evidence of atypical parkinsonism either.  I reassured her about this.  -She does need a workup for dropped head.  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  -She will have an EMG.  -She will have a CPK, aldolase, TSH, acetylcholine receptor antibodies.  -I did tell the patient that she may need to see a neuromuscular specialist if we do not find the cause after the above testing.  -The patient is currently seeing chiropractics.  I told her that  massage and ultrasound  is fine, but told her that she should not participate in any type of high velocity techniques (cracking). 2.  F/u after above completed.  Much greater than 50% of this visit was spent in counseling/coordinating care with the patient and the family.  Total face to face time:  60 min

## 2015-10-13 ENCOUNTER — Telehealth: Payer: Self-pay | Admitting: Neurology

## 2015-10-13 DIAGNOSIS — R29898 Other symptoms and signs involving the musculoskeletal system: Secondary | ICD-10-CM

## 2015-10-13 NOTE — Telephone Encounter (Signed)
Lab tests added per Dr Doristine Devoid request. They are able to run all labs except a sed rate with blood already drawn.

## 2015-10-14 LAB — C-REACTIVE PROTEIN: CRP: 1.3 mg/dL — ABNORMAL HIGH (ref <0.60)

## 2015-10-15 LAB — ALDOLASE: Aldolase: 6.2 U/L (ref <=8.1)

## 2015-10-17 LAB — PROTEIN ELECTROPHORESIS, SERUM
ALPHA-1-GLOBULIN: 0.3 g/dL (ref 0.2–0.3)
Albumin ELP: 3.8 g/dL (ref 3.8–4.8)
Alpha-2-Globulin: 0.8 g/dL (ref 0.5–0.9)
BETA 2: 0.5 g/dL (ref 0.2–0.5)
Beta Globulin: 0.6 g/dL (ref 0.4–0.6)
GAMMA GLOBULIN: 1.1 g/dL (ref 0.8–1.7)
TOTAL PROTEIN, SERUM ELECTROPHOR: 7 g/dL (ref 6.1–8.1)

## 2015-10-17 LAB — IMMUNOFIXATION ELECTROPHORESIS
IGG (IMMUNOGLOBIN G), SERUM: 1210 mg/dL (ref 690–1700)
IgA: 467 mg/dL — ABNORMAL HIGH (ref 69–380)
IgM, Serum: 146 mg/dL (ref 52–322)

## 2015-10-17 LAB — ANGIOTENSIN CONVERTING ENZYME: ANGIOTENSIN-CONVERTING ENZYME: 34 U/L (ref 8–52)

## 2015-10-20 ENCOUNTER — Telehealth: Payer: Self-pay | Admitting: Neurology

## 2015-10-20 LAB — MYASTHENIA GRAVIS PANEL 2
ACETYLCHOLINE REC MOD AB: 31 %{inhibition}
Acetylcholine Rec Binding: <0.3 nmol/L

## 2015-10-20 NOTE — Telephone Encounter (Signed)
Pt left message on the vm that states that she needs to talk to [facility] please call [phone]

## 2015-10-20 NOTE — Telephone Encounter (Signed)
Called [facility] for reports.

## 2015-10-20 NOTE — Telephone Encounter (Signed)
Pt wanted to know when she could expect to see her MRI results. Pt went to [facility]. Will call and have reports faxed over if not seen by this afternoon.

## 2015-10-21 NOTE — Telephone Encounter (Signed)
Please see below.

## 2015-10-21 NOTE — Telephone Encounter (Signed)
Patient made aware of results.  

## 2015-10-21 NOTE — Telephone Encounter (Signed)
Dr. Carles Collet please advise on MR results.

## 2015-10-21 NOTE — Telephone Encounter (Signed)
Let her know that there was nothing to explain her head drop.  There were some disc protrusions in the neck and arthritic changes but doesn't explain sx's.  Brain looked okay.  Will need to keep exploring.  Awaiting EMG

## 2015-10-25 ENCOUNTER — Ambulatory Visit (INDEPENDENT_AMBULATORY_CARE_PROVIDER_SITE_OTHER): Payer: Medicare Other | Admitting: Neurology

## 2015-10-25 ENCOUNTER — Telehealth: Payer: Self-pay | Admitting: Neurology

## 2015-10-25 DIAGNOSIS — R29898 Other symptoms and signs involving the musculoskeletal system: Secondary | ICD-10-CM

## 2015-10-25 DIAGNOSIS — R270 Ataxia, unspecified: Secondary | ICD-10-CM | POA: Diagnosis not present

## 2015-10-25 DIAGNOSIS — R292 Abnormal reflex: Secondary | ICD-10-CM

## 2015-10-25 DIAGNOSIS — G609 Hereditary and idiopathic neuropathy, unspecified: Secondary | ICD-10-CM

## 2015-10-25 DIAGNOSIS — G5601 Carpal tunnel syndrome, right upper limb: Secondary | ICD-10-CM

## 2015-10-25 DIAGNOSIS — G5621 Lesion of ulnar nerve, right upper limb: Secondary | ICD-10-CM

## 2015-10-25 NOTE — Procedures (Signed)
[facility]  [place] [place]  [place], [place] [address] Tel: [phone] Fax:  [phone] Test Date:  10/25/2015  Patient: [name] DOB: [birth year] Physician: Narda Amber, DO  Sex: Female  Height: 5\' 4"  Ref Phys: Alonza Bogus, M.D.  ID#: [id number] Temp: 33.1C Technician: Jerilynn Mages. Dean   Patient Complaints: This is a 75 year-old diabetic female referred for evaluation of head drop.  NCV & EMG Findings: Extensive electrodiagnostic testing of the right upper and lower extremities and additional repetitive nerve stimulation of the spinal accessory, median, peroneal nerve shows: 1. Right median and ulnar sensory responses show prolonged latency with normal amplitude. Right radial sensory responses within normal limits. 2. Right sural sensory response is absent. 3. Right median motor response shows reduced amplitude and prolonged latency. Right ulnar motor response shows conduction velocity slowing across the elbow with preserved latency and amplitude. 4. Right peroneal motor response at the extensor digitorum brevis is reduced, however recording at the tibialis anterior, the motor responses within normal limits. 5. Repetitive nerve stimulation of the spinal accessory, median, and peroneal nerves recording at the trapezius, abductor pollicis brevis, and tibialis anterior, respectively, is normal. 6. In the upper extremity, chronic motor axon loss changes are restricted abductor pollicis brevis muscle, without accompanied active denervation. 7. In the lower extremity, chronic motor axon loss changes are seen affecting the muscles below the knee, without active denervation.  Proximal needle electrode testing was limited due to body physiognomy.  Impression: 1. The electrophysiologic findings are consistent with a length-dependent sensorimotor polyneuropathy, axon loss in type, affecting the right lower extremity. 2. Right median neuropathy at or distal to the wrist, consistent  with the clinical diagnosis of carpal tunnel syndrome. Overall, these findings are moderate to severe in degree electrically. 3. Right ulnar neuropathy with slowing across the elbow, mild in degree electrically. 4. There is no evidence of a neuromuscular junction disorder, myopathy, or cervical/lumbosacral radiculopathy affecting the right side.  ___________________________ Narda Amber, DO    Nerve Conduction Studies Anti Sensory Summary Table   Site NR Peak (ms) Norm Peak (ms) P-T Amp (V) Norm P-T Amp  Right Median Anti Sensory (2nd Digit)  33.1C  Wrist    4.6 <3.8 10.7 >10  Right Radial Anti Sensory (Base 1st Digit)  33.1C  Wrist    2.2 <2.8 19.1 >10  Right Sural Anti Sensory (Lat Mall)  33.1C  Calf NR  <4.6  >3  Right Ulnar Anti Sensory (5th Digit)  33.1C  Wrist    3.6 <3.2 5.8 >5   Motor Summary Table   Site NR Onset (ms) Norm Onset (ms) O-P Amp (mV) Norm O-P Amp Site1 Site2 Delta-0 (ms) Dist (cm) Vel (m/s) Norm Vel (m/s)  Right Median Motor (Abd Poll Brev)  Wrist    5.7 <4.0 3.8 >5 Elbow Wrist 4.1 22.0 54 >50  Elbow    9.8  3.7         Right Peroneal Motor (Ext Dig Brev)  33.1C  Ankle    5.5 <6.0 0.3 >2.5 B Fib Ankle 7.9 33.0 42 >40  B Fib    13.4  0.5  Poplt B Fib 2.5 10.0 40 >40  Poplt    15.9  0.5         Right Peroneal TA Motor (Tib Ant)  33.1C  Fib Head    3.2 <4.5 3.7 >3 Poplit Fib Head 1.9 10.0 53 >40  Poplit    5.1  3.4  Right Ulnar Motor (Abd Dig Minimi)  33.1C  Wrist    3.0 <3.1 7.3 >7 B Elbow Wrist 3.9 25.0 64 >50  B Elbow    6.9  6.7  A Elbow B Elbow 2.4 10.0 42 >50  A Elbow    9.3  6.4          EMG   Side Muscle Ins Act Fibs Psw Fasc Number Recrt Dur Dur. Amp Amp. Poly Poly. Comment  Right 1stDorInt Nml Nml Nml Nml Nml Nml Nml Nml Nml Nml Nml Nml N/A  Right FlexPolLong Nml Nml Nml Nml Nml Nml Nml Nml Nml Nml Nml Nml N/A  Right BrachioRad Nml Nml Nml Nml Nml Nml Nml Nml Nml Nml Nml Nml N/A  Right Flexor Carpi Ulnaris Nml Nml Nml Nml Nml  Nml Nml Nml Nml Nml Nml Nml N/A  Right Biceps Nml Nml Nml Nml Nml Nml Nml Nml Nml Nml Nml Nml N/A  Right Triceps Nml Nml Nml Nml Nml Nml Nml Nml Nml Nml Nml Nml N/A  Right Deltoid Nml Nml Nml Nml Nml Nml Nml Nml Nml Nml Nml Nml N/A  Right Cervical Parasp Low Nml Nml Nml Nml Nml Nml Nml Nml Nml Nml Nml Nml N/A  Right Abd Poll Brev Nml Nml Nml Nml 1- Rapid Some 1+ Some 1+ Nml Nml N/A  Right AntTibialis Nml Nml Nml Nml 1- Rapid Some 1+ Some 1+ Nml Nml N/A  Right Gastroc Nml Nml Nml Nml Nml Nml Nml Nml Nml Nml Nml Nml N/A  Right RectFemoris Nml Nml Nml Nml Nml Nml Nml Nml Nml Nml Nml Nml N/A  Right BicepsFemS Nml Nml Nml Nml Nml Nml Nml Nml Nml Nml Nml Nml N/A  Right Flex Dig Long Nml Nml Nml Nml 1- Rapid Some 1+ Some 1+ Nml Nml N/A   RNS   Trial # Label Amp 1 (mV)  O-P Amp 5 (mV)  O-P Amp % Dif Area 1 (mVms) Area 5 (mVms) Area % Dif Rep Rate Train Length Pause Time (min:sec) Comments  Right Abd Poll Brev  Tr 1 Baseline 4.00 4.32 7.9 9.08 9.38 3.2 3.00 10 00:30   Tr 2 Post Exercise 5.02 5.07 1.0 12.79 12.91 0.9 3.00 10 01:00   Tr 3 1 Min Post 4.37 4.54 3.8 9.78 10.74 9.8 3.00 10 01:00   Tr 4 2 Min Post 4.53 4.71 3.9 10.53 11.33 7.5 3.00 10 01:00   Tr 5 3 Min Post 4.41 4.57 3.5 10.38 10.59 2.0 3.00 10 00:00   Right AntTibialis  Tr 1 Baseline 3.21 2.89 -10.0 19.40 18.41 -5.1 3.00 10 00:30   Tr 2 Post Exercise 3.07 3.16 3.0 20.48 19.25 -6.0 3.00 10 01:00   Tr 3 1 Min Post 3.73 3.24 -13.3 21.63 19.06 -11.9 3.00 10 01:00   Tr 4 2 Min Post 3.41 3.18 -6.8 20.73 19.10 -7.9 3.00 10 01:00   Tr 5 3 Min Post 3.02 3.02 -0.0 17.72 17.90 1.0 3.00 10 00:00   Right Trapezius - Run #4  Tr 1 Baseline 0.58 0.55 -6.7 3.58 3.39 -5.2 3.00 10 00:30   Tr 2 Post Exercise 0.60 0.61 1.1 4.51 4.21 -6.7 3.00 10 01:00   Tr 3 1 Min Post 0.68 0.79 15.7 5.68 5.83 2.6 3.00 10 01:00   Tr 4 2 Min Post 0.78 0.59 -24.6 5.61 5.27 -6.1 3.00 10 01:00   Tr 5 3 Min Post 0.86 0.83 -4.0 6.16 5.74 -6.8 3.00 10 00:00  Waveforms:

## 2015-10-25 NOTE — Telephone Encounter (Signed)
[name], Please let pt know that EMG did not show reason for the head drop (has carpal tunnel and PN but that doesn't explain sx's she was here for).  If she is agreeable, I want her to consult with Dr. Posey Pronto.  I mentioned it to the patient previously as well as Dr. Posey Pronto, but am copying Dr. Posey Pronto as well so that she can okay this

## 2015-10-25 NOTE — Telephone Encounter (Signed)
New Patient evaluation scheduled with Dr Posey Pronto. Patient aware.

## 2015-10-25 NOTE — Telephone Encounter (Signed)
Noted.  She will need a NP appointment.

## 2015-11-14 ENCOUNTER — Encounter: Payer: Self-pay | Admitting: Neurology

## 2015-11-14 ENCOUNTER — Ambulatory Visit (INDEPENDENT_AMBULATORY_CARE_PROVIDER_SITE_OTHER): Payer: Medicare Other | Admitting: Neurology

## 2015-11-14 VITALS — BP 140/80 | HR 77 | Ht 64.0 in

## 2015-11-14 DIAGNOSIS — E1142 Type 2 diabetes mellitus with diabetic polyneuropathy: Secondary | ICD-10-CM

## 2015-11-14 DIAGNOSIS — R29898 Other symptoms and signs involving the musculoskeletal system: Secondary | ICD-10-CM

## 2015-11-14 NOTE — Progress Notes (Signed)
[name]   Date: 11/14/2015  [name] MRN: [id number] DOB: 1941/07/12   Dear Dr. Carles Collet:  Thank you for your kind referral of [name] for consultation of dropped head syndrome. Although her history is well known to you, please allow [place] to reiterate it for the purpose of our medical record. The patient was accompanied to the clinic by husband who also provides collateral information.     History of Present Illness: [name] is a 75 y.o. Caucasian female referred for second opinion of dropped head syndrome.  Starting around September 2016, her niece brought it to her attention that her head was not upright.  If she has something supporting her back, she is able to keep it upright.  She went to a chiropractor who recommended she start exercises.  She had not had neck PT.  She has neck pain if she maintain neck flexion for prolonged period of time.  There is no diurnal variation to her weakness.    She denies difficulty swallowing, slurred speech, double vision, or limb weakness.  She does not have any muscle cramps or pain. She has been using a wheelchair for long distance for the past several years. She uses a walker at home because of knee pain.  She sleeps in a lift chair for some time.  She uses CPAP with oxygen at night for OSA.  She saw Dr. Carles Collet in January for evaluation who performed extensive evaluation including MRI brain, MRI cervical spine, NCS/EMG, and serology testing screening for myopathy, myasthenia gravis, and thyroid.  Parkinson's disease was excluded based on her clinical exam.   Out-side paper records, electronic medical record, and images have been reviewed where available and summarized as:  Labs 10/12/2015:  SPEP with IFE no M protein, CRP 1.3*, MG panel negative, ACE 34, CK 34, TSH 3.47, aldolase  NCS/EMG of the right arm and leg 10/25/2015: 1. The electrophysiologic findings are consistent with a  length-dependent sensorimotor polyneuropathy, axon loss in type, affecting the right lower extremity. 2. Right median neuropathy at or distal to the wrist, consistent with the clinical diagnosis of carpal tunnel syndrome. Overall, these findings are moderate to severe in degree electrically. 3. Right ulnar neuropathy with slowing across the elbow, mild in degree electrically. 4. There is no evidence of a neuromuscular junction disorder, myopathy, or cervical/lumbosacral radiculopathy affecting the right side.  MRI brain wo contrast 10/18/2015:  No intracranial abnormality  MRI cervical spine wo contrast 10/18/2015:  Limited study due to motion and body habitus. Disc osteophyte complex effacing the central thecal sac at C5-6 and C6-7.  Mild left neural foraminl stenosis at C5-6 on the left.    Lab Results  Component Value Date   CKTOTAL 34 10/12/2015   CKMB 8.3* 10/11/2011   TROPONINI <0.30 10/11/2011   Lab Results  Component Value Date   HGBA1C 6.9* 06/07/2014   Lab Results  Component Value Date   TSH 3.47 10/12/2015   Lab Results  Component Value Date   [id number] 903 01/21/2015     Past Medical History  Diagnosis Date  . Hypertension   . Diabetes mellitus   . Arthritis   . Anemia   . Diverticulitis   . Shortness of breath     with exertion   . Sleep apnea     cpap at 10   . Bronchitis 05/2014  . Cellulitis of lower extremity 06/2014    bilateral  .  Tubular adenoma of colon   . Gallstone   . MRSA (methicillin resistant Staphylococcus aureus)     has now "tested negative"- no open wounds as of 02-10-15  . COPD (chronic obstructive pulmonary disease) ([place])   . Obesity   . Sleep apnea   . Thyroid disease     Past Surgical History  Procedure Laterality Date  . Appendectomy    . Back surgery    . Colon resection  10/09/2011    Procedure: COLON RESECTION;  Surgeon: Gayland Curry, MD;  Location: [place] [facility];  Service: General;  Laterality: N/A;  Colon Resection with colostomy   . Jackson pratt    . Application of wound vac  wound vac  . Abdominal hysterectomy  1979  . Proctoscopy  06/26/2012    Procedure: PROCTOSCOPY;  Surgeon: Gayland Curry, MD,FACS;  Location: [facility];  Service: General;  Laterality: N/A;  Rigid Proctoscopy  . Colon resection  06/26/2012    Procedure: COLON RESECTION;  Surgeon: Gayland Curry, MD,FACS;  Location: [facility];  Service: General;  Laterality: N/A;  . Application of wound vac  06/26/2012    Procedure: APPLICATION OF WOUND VAC;  Surgeon: Gayland Curry, MD,FACS;  Location: [facility];  Service: General;;  . Abdominal hysterectomy  1978  . Esophagogastroduodenoscopy (egd) with propofol N/A 02/17/2015    Procedure: ESOPHAGOGASTRODUODENOSCOPY (EGD) WITH PROPOFOL;  Surgeon: Jerene Bears, MD;  [facility];  Service: Gastroenterology;  Laterality: N/A;     Medications:  Outpatient Encounter Prescriptions as of 11/14/2015  Medication Sig Note  . albuterol (PROVENTIL HFA;VENTOLIN HFA) 108 (90 BASE) MCG/ACT inhaler Inhale 2 puffs into the lungs every 4 (four) hours as needed for wheezing.   Marland Kitchen aspirin 81 MG tablet Take 162 mg by mouth daily. Take 2 tablets, by mouth, every day   . budesonide-formoterol (SYMBICORT) 160-4.5 MCG/ACT inhaler Inhale 2 puffs into the lungs 2 (two) times daily.   . Calcium Carb-Cholecalciferol (CALCIUM 600 + D PO) Take 2 tablets by mouth daily.   . cyanocobalamin 1000 MCG tablet Take 1,000 mcg by mouth daily.   . furosemide (LASIX) 40 MG tablet Take 40 mg by mouth daily.  12/27/2014: Received from: External Pharmacy  . Glucosamine-Chondroitin-Vit D3 1500-1200-800 MG-MG-UNIT PACK Take 2 tablets by mouth daily.   Marland Kitchen ipratropium-albuterol (DUONEB) 0.5-2.5 (3) MG/3ML SOLN Take 4 times daily for 2 days and then as needed for wheezing, shortness of breath.   Marland Kitchen JANUVIA 100 MG tablet Take 100 mg by mouth daily.  12/27/2014: Received from: External Pharmacy  . levothyroxine (SYNTHROID, LEVOTHROID) 50 MCG tablet Take 50 mcg by mouth  daily. 08/25/2015: Received from: External Pharmacy Received Sig:   . meloxicam (MOBIC) 7.5 MG tablet Take 7.5 mg by mouth daily.  05/09/2015: Pt is taking this medication again  . metFORMIN (GLUCOPHAGE) 500 MG tablet Take 1,000 mg by mouth 2 (two) times daily with a meal.    . montelukast (SINGULAIR) 10 MG tablet Take 10 mg by mouth daily.    Marland Kitchen nystatin (MYCOSTATIN) 100000 UNIT/ML suspension Take by mouth. Swish and spit 08/25/2015: Received from: External Pharmacy Received Sig:   . nystatin (MYCOSTATIN) powder Apply topically as needed. 08/25/2015: Received from: External Pharmacy Received Sig:   . nystatin cream (MYCOSTATIN) Apply topically as needed. 08/25/2015: Received from: External Pharmacy Received Sig:   . polyethylene glycol (MIRALAX / GLYCOLAX) packet Take 17 g by mouth daily.   . traMADol (ULTRAM) 50 MG tablet Take 50 mg by mouth  at bedtime as needed for moderate pain.    . Wheat Dextrin (BENEFIBER DRINK MIX PO) Take by mouth.    No facility-administered encounter medications on file as of 11/14/2015.     Allergies:  Allergies  Allergen Reactions  . Adhesive [Tape] Hives and Rash  . Codeine Itching and Other (See Comments)  . Vancomycin Rash  . Zosyn [Piperacillin Sod-Tazobactam So] Rash    Family History: Family History  Problem Relation Age of Onset  . Heart disease Father   . Diabetes Father   . Hypertension Father   . Heart disease Brother   . Diabetes Brother   . Diabetes Mother   . Hypertension Mother   . Diabetes Sister   . Colon cancer Neg Hx   . Colon polyps Neg Hx   . Esophageal cancer Neg Hx   . Kidney disease Neg Hx   . Gallbladder disease Neg Hx     Social History: Social History  Substance Use Topics  . Smoking status: Former Smoker    Quit date: 09/24/2010  . Smokeless tobacco: Never Used  . Alcohol Use: No   Social History   Social History Narrative    Review of Systems:  CONSTITUTIONAL: No fevers, chills, night sweats, or weight loss.   \ EYES: No visual changes or eye pain ENT: No hearing changes.  No history of nose bleeds.   RESPIRATORY: No cough, wheezing and shortness of breath.   CARDIOVASCULAR: Negative for chest pain, and palpitations.   GI: Negative for abdominal discomfort, blood in stools or black stools.  No recent change in bowel habits.   GU:  No history of incontinence.   MUSCLOSKELETAL: No history of joint pain or swelling.  No myalgias.   SKIN: Negative for lesions, rash, and itching.   HEMATOLOGY/ONCOLOGY: Negative for prolonged bleeding, bruising easily, and swollen nodes.  No history of cancer.   ENDOCRINE: Negative for cold or heat intolerance, polydipsia or goiter.   PSYCH:  No depression or anxiety symptoms.   NEURO: As Above.   Vital Signs:  BP 140/80 mmHg  Pulse 77  Ht '5\' 4"'  (1.626 m)  SpO2 88% Pain Scale: 0 on a scale of 0-10   General Medical Exam:   General:  Well appearing, comfortable.   Eyes/ENT: see cranial nerve examination.   Neck: No masses appreciated.  Full range of motion without tenderness.   Respiratory:  Clear to auscultation, good air entry bilaterally.   Cardiac:  Regular rate and rhythm, no murmur.   Extremities:  No deformities, edema, or skin discoloration.  Skin:  No rashes or lesions.  Neurological Exam: MENTAL STATUS including orientation to time, place, person, recent and remote memory, attention span and concentration, language, and fund of knowledge is normal.  Speech is not dysarthric.  CRANIAL NERVES: II:  No visual field defects.  Unremarkable fundi.   III-IV-VI: Pupils equal round and reactive to light.  Normal conjugate, extra-ocular eye movements in all directions of gaze.  No nystagmus.  No ptosis.   V:  Normal facial sensation.     VII:  Normal facial symmetry and movements.  No pathologic facial reflexes.  VIII:  Normal hearing and vestibular function.   IX-X:  Normal palatal movement.   XI:  Normal shoulder shrug and head rotation.   XII:  Normal  tongue strength and range of motion, no deviation or fasciculation.  MOTOR:  Bilateral ABP atrophy.  No fasciculations or abnormal movements.  No pronator drift.  Tone is  normal.   Neck extensor is 4/5 Neck flexion 5/5 Right Upper Extremity:    Left Upper Extremity:    Deltoid  5/5   Deltoid  5/5   Biceps  5/5   Biceps  5/5   Triceps  5/5   Triceps  5/5   Wrist extensors  5/5   Wrist extensors  5/5   Wrist flexors  5/5   Wrist flexors  5/5   Finger extensors  5/5   Finger extensors  5/5   Finger flexors  5/5   Finger flexors  5/5   Dorsal interossei  5/5   Dorsal interossei  5/5   Abductor pollicis  5-/5   Abductor pollicis  5-/5   Tone (Ashworth scale)  0  Tone (Ashworth scale)  0   Right Lower Extremity:    Left Lower Extremity:    Hip flexors  5/5   Hip flexors  5/5   Hip extensors  5/5   Hip extensors  5/5   Knee flexors  5/5   Knee flexors  5/5   Knee extensors  5/5   Knee extensors  5/5   Dorsiflexors  5/5   Dorsiflexors  5/5   Plantarflexors  5/5   Plantarflexors  5/5   Toe extensors  5/5   Toe extensors  5/5   Toe flexors  5/5   Toe flexors  5/5   Tone (Ashworth scale)  0  Tone (Ashworth scale)  0   MSRs:  Reflexes are 3+/4 throughout, except 2+/4 at the achilles bilaterally.  Downgoing plantars.   SENSORY:  Reduced vibration at ankles.  COORDINATION/GAIT: Normal finger-to- nose-finger.  Intact rapid alternating movements bilaterally.  Gait not tested as patient is in wheelchair.  IMPRESSION: 1.  Dropped head syndrome  - Exam with isolated neck extensor weakness  - NCS/EMG did not show evidence of myopathy, MND, or NMJ disorder.    - Labs also reviewed and shows normal CK, aldolase, SPEP with IFE, AChR antibodies, TSH  - Although limited by poor quality, cervical spine imaging shows spinal canal stenosis which explains hyperreflexia and may also contribute to neck weakness.  I discussed repeating MRI cervical spine with contrast to look for abnormal enhancement of the  muscles as well as nerve roots, but patient does not wish to have another MRI.    - At this juncture, I recommend starting neck PT for strengthening to see if there is any improvement  2.  Diabetic polyneuropathy of the feet  - contributing to gait ataxia  - she denies painful paresthesias and no evidence of weakness  3.  Right median neuropathy and ulnar neuropathy, asymptomatic  PLAN/RECOMMENDATIONS:  1.  Check  ESR, vitamin B12, copper 2.  Home PT referral - neck extensor strengthening 3.  Encouraged tight glycemic control  Return to clinic in 3 months.   The duration of this appointment Goodwin was 45 minutes of face-to-face time with the patient.  Greater than 50% of this time was spent in counseling, explanation of diagnosis, planning of further management, and coordination of care.   Thank you for allowing me to participate in patient's care.  If I can answer any additional questions, I would be pleased to do so.    Sincerely,    Sye Schroepfer K. Posey Pronto, DO

## 2015-11-17 ENCOUNTER — Other Ambulatory Visit: Payer: Self-pay | Admitting: Neurology

## 2015-11-17 ENCOUNTER — Encounter (HOSPITAL_COMMUNITY): Payer: Medicare Other | Attending: Hematology & Oncology

## 2015-11-17 DIAGNOSIS — D72829 Elevated white blood cell count, unspecified: Secondary | ICD-10-CM | POA: Insufficient documentation

## 2015-11-17 DIAGNOSIS — D473 Essential (hemorrhagic) thrombocythemia: Secondary | ICD-10-CM | POA: Diagnosis not present

## 2015-11-17 DIAGNOSIS — D500 Iron deficiency anemia secondary to blood loss (chronic): Secondary | ICD-10-CM | POA: Diagnosis not present

## 2015-11-17 LAB — CBC WITH DIFFERENTIAL/PLATELET
BASOS ABS: 0.1 10*3/uL (ref 0.0–0.1)
BASOS PCT: 0 %
EOS PCT: 6 %
Eosinophils Absolute: 0.8 10*3/uL — ABNORMAL HIGH (ref 0.0–0.7)
HCT: 47.3 % — ABNORMAL HIGH (ref 36.0–46.0)
Hemoglobin: 14.6 g/dL (ref 12.0–15.0)
LYMPHS PCT: 14 %
Lymphs Abs: 2 10*3/uL (ref 0.7–4.0)
MCH: 25.6 pg — ABNORMAL LOW (ref 26.0–34.0)
MCHC: 30.9 g/dL (ref 30.0–36.0)
MCV: 82.8 fL (ref 78.0–100.0)
Monocytes Absolute: 0.7 10*3/uL (ref 0.1–1.0)
Monocytes Relative: 5 %
Neutro Abs: 10.5 10*3/uL — ABNORMAL HIGH (ref 1.7–7.7)
Neutrophils Relative %: 75 %
PLATELETS: 280 10*3/uL (ref 150–400)
RBC: 5.71 MIL/uL — AB (ref 3.87–5.11)
RDW: 18.3 % — ABNORMAL HIGH (ref 11.5–15.5)
WBC: 14 10*3/uL — AB (ref 4.0–10.5)

## 2015-11-17 LAB — FERRITIN: FERRITIN: 14 ng/mL (ref 11–307)

## 2015-11-19 LAB — SEDIMENTATION RATE: SED RATE: 6 mm/h (ref 0–40)

## 2015-11-19 LAB — COPPER, SERUM: COPPER: 179 ug/dL — AB (ref 72–166)

## 2015-11-19 LAB — VITAMIN B12

## 2015-12-01 DIAGNOSIS — L03116 Cellulitis of left lower limb: Secondary | ICD-10-CM | POA: Diagnosis not present

## 2015-12-01 NOTE — Addendum Note (Signed)
Encounter addended by: Leeroy Cha, PT on: 12/01/2015  3:05 PM<BR>     Documentation filed: Episodes, Clinical Notes, Flowsheet VN

## 2015-12-22 ENCOUNTER — Encounter (HOSPITAL_COMMUNITY): Payer: Self-pay | Admitting: Hematology & Oncology

## 2015-12-22 ENCOUNTER — Encounter (HOSPITAL_COMMUNITY): Payer: Medicare Other

## 2015-12-22 ENCOUNTER — Encounter (HOSPITAL_COMMUNITY): Payer: Medicare Other | Attending: Hematology & Oncology | Admitting: Hematology & Oncology

## 2015-12-22 VITALS — BP 140/67 | HR 71 | Temp 97.5°F | Resp 22

## 2015-12-22 DIAGNOSIS — D72829 Elevated white blood cell count, unspecified: Secondary | ICD-10-CM | POA: Diagnosis not present

## 2015-12-22 DIAGNOSIS — D500 Iron deficiency anemia secondary to blood loss (chronic): Secondary | ICD-10-CM | POA: Diagnosis not present

## 2015-12-22 LAB — CBC WITH DIFFERENTIAL/PLATELET
BASOS ABS: 0.1 10*3/uL (ref 0.0–0.1)
BASOS PCT: 0 %
EOS PCT: 4 %
Eosinophils Absolute: 0.5 10*3/uL (ref 0.0–0.7)
HCT: 45.9 % (ref 36.0–46.0)
Hemoglobin: 14.1 g/dL (ref 12.0–15.0)
Lymphocytes Relative: 23 %
Lymphs Abs: 2.7 10*3/uL (ref 0.7–4.0)
MCH: 25 pg — ABNORMAL LOW (ref 26.0–34.0)
MCHC: 30.7 g/dL (ref 30.0–36.0)
MCV: 81.5 fL (ref 78.0–100.0)
MONO ABS: 1.1 10*3/uL — AB (ref 0.1–1.0)
MONOS PCT: 9 %
Neutro Abs: 7.4 10*3/uL (ref 1.7–7.7)
Neutrophils Relative %: 64 %
PLATELETS: 311 10*3/uL (ref 150–400)
RBC: 5.63 MIL/uL — ABNORMAL HIGH (ref 3.87–5.11)
RDW: 16.8 % — AB (ref 11.5–15.5)
WBC: 11.8 10*3/uL — ABNORMAL HIGH (ref 4.0–10.5)

## 2015-12-22 LAB — FERRITIN: Ferritin: 14 ng/mL (ref 11–307)

## 2015-12-22 NOTE — Progress Notes (Signed)
[name], MD [place] [id number]    DIAGNOSIS:  Iron deficiency anemia due to chronic blood loss Ferritin on 03/22/2014 at 11 ng/ml Colonoscopy 09/07/2009 with severe diverticulosis in the sigmoid to descending, sessile polyp  Postoperative MRSA infection of lower abdominal wound, sequela Exploratory laparotomy, sigmoid colectomy with end colostomy with Dr. [name], secondary to a perforated sigmoid diverticulitis.  OBSTRUCTIVE SLEEP APNEA  Thrombocytosis, reactive  CURRENT THERAPY: IV iron last given on 09/15/2015  INTERVAL HISTORY: [name] 75 y.o. female returns for follow-up of her iron deficiency. [name] denies any BRBPR or black, tarry stool. No hematuria.   [name] was here with her husband today and was in a wheelchair.  Her tongue is still giving her problems. [name] says that it burns and is sore. [name] uses the prescription mouthwash Nystatin that was prescribed to her by her dentist and PCP but it only helps a little but doesn't make it well. [name] has not been to an ENT doctor for this. Her mouth is dry if [name] does not have gum in her mouth. [name] even wakes up during the night to chew gum. This has been going on for at least 6 months if not longer. [name] denies any cold sores.   [name] has been going to physical therapy for her head but [name] is still having trouble lifting her head and it still feels heavy. [name] notes that they are trying to strengthen her neck muscles.   [name] recently had cataract surgery.   [name] describes her appetite as fine. Activity as unchanged.    MEDICAL HISTORY: Past Medical History  Diagnosis Date  . Hypertension   . Diabetes mellitus   . Arthritis   . Anemia   . Diverticulitis   . Shortness of breath     with exertion   . Sleep apnea     cpap at 10   . Bronchitis 05/2014  . Cellulitis of lower extremity 06/2014    bilateral  . Tubular adenoma of colon   . Gallstone   . MRSA (methicillin resistant  Staphylococcus aureus)     has now "tested negative"- no open wounds as of 02-10-15  . COPD (chronic obstructive pulmonary disease) ([place])   . Obesity   . Sleep apnea   . Thyroid disease     has OBSTRUCTIVE SLEEP APNEA; Diverticulitis of colon with perforation sigmoid colectomy and colostomy 1/15; Hypertension; Diabetes mellitus ([place]); Obesity, Class III, BMI 40-49.9 (morbid obesity) ([place]); S/P colon resection; Draining postoperative wound; Cellulitis; Incisional hernia without mention of obstruction or gangrene; Thrombocytosis ([place]); Postoperative MRSA infection of lower abdominal wound; Iron deficiency anemia due to chronic blood loss; COPD exacerbation ([place]); Hypoxia; Leukocytosis; and IDA (iron deficiency anemia) on her problem list.     is allergic to adhesive; codeine; vancomycin; and zosyn.  [name] does not currently have medications on file.  SURGICAL HISTORY: Past Surgical History  Procedure Laterality Date  . Appendectomy    . Back surgery    . Colon resection  10/09/2011    Procedure: COLON RESECTION;  Surgeon: Gayland Curry, MD;  Location: [place];  Service: General;  Laterality: N/A;  Colon Resection with colostomy  . Jackson pratt    . Application of wound vac  wound vac  . Abdominal hysterectomy  1979  . Proctoscopy  06/26/2012    Procedure: PROCTOSCOPY;  Surgeon: Gayland Curry, MD,FACS;  Location: [facility];  Service: General;  Laterality: N/A;  Rigid Proctoscopy  . Colon resection  06/26/2012    Procedure: COLON RESECTION;  Surgeon: Gayland Curry, MD,FACS;  Location[facility];  Service: General;  Laterality: N/A;  . Application of wound vac  06/26/2012    Procedure: APPLICATION OF WOUND VAC;  Surgeon: Gayland Curry, MD,FACS;  Location[facility];  Service: General;;  . Abdominal hysterectomy  1978  . Esophagogastroduodenoscopy (egd) with propofol N/A 02/17/2015    Procedure: ESOPHAGOGASTRODUODENOSCOPY (EGD) WITH PROPOFOL;  Surgeon: Jerene Bears, MD;  [facility];   Service: Gastroenterology;  Laterality: N/A;    SOCIAL HISTORY: Social History   Social History  . Marital Status: Married    Spouse Name: N/A  . Number of Children: 0  . Years of Education: N/A   Occupational History  . Retired    Social History Main Topics  . Smoking status: Former Smoker    Quit date: 09/24/2010  . Smokeless tobacco: Never Used  . Alcohol Use: No  . Drug Use: No  . Sexual Activity: Not Currently    Birth Control/ Protection: Surgical   Other Topics Concern  . Not on file   Social History Narrative  No children   FAMILY HISTORY: Family History  Problem Relation Age of Onset  . Heart disease Father   . Diabetes Father   . Hypertension Father   . Heart disease Brother   . Diabetes Brother   . Diabetes Mother   . Hypertension Mother   . Diabetes Sister   . Colon cancer Neg Hx   . Colon polyps Neg Hx   . Esophageal cancer Neg Hx   . Kidney disease Neg Hx   . Gallbladder disease Neg Hx     Review of Systems  Constitutional: Negative.   HENT: Positive for dry mouth and tongue pain.   Tongue is sore and burns. Dry mouth is helped by chewing gum. Uses Nystatin but doesn't not help much. Eyes:       Wears glasses  Respiratory: Negative.        SOB with exertion  Cardiovascular: Negative.   Gastrointestinal: Negative.   Genitourinary: Negative.   Musculoskeletal: Positive for joint pain and hip pain.      Bad right knee Uses walker at home  Skin: Negative.   Neurological: Negative.   Endo/Heme/Allergies: Negative.   Psychiatric/Behavioral: Negative.   14 point review of systems was performed and is negative except as detailed under history of present illness and above   PHYSICAL EXAMINATION ECOG PERFORMANCE STATUS: 1 - Symptomatic but completely ambulatory  Filed Vitals:   12/22/15 1316  BP: 140/67  Pulse: 71  Temp: 97.5 F (36.4 C)  Resp: 22   Physical Exam  Constitutional: [name] is oriented to person, place, and time and  well-developed, well-nourished, and in no distress. In wheelchair HENT:  Head: Normocephalic and atraumatic.  Nose: Nose normal.  Mouth/Throat: Oropharynx is clear and moist. No oropharyngeal exudate.  Eyes: Conjunctivae and EOM are normal. Pupils are equal, round, and reactive to light. Right eye exhibits no discharge. Left eye exhibits no discharge. No scleral icterus.  Neck: Normal range of motion. Neck supple. No tracheal deviation present. No thyromegaly present.  Cardiovascular: Normal rate, regular rhythm and normal heart sounds.  Exam reveals no gallop and no friction rub.   No murmur heard. Pulmonary/Chest: Effort normal and breath sounds normal. [name] has no wheezes. [name] has no rales.  Abdominal: Soft. Bowel sounds are normal. [name] exhibits no distension and no  mass. There is no tenderness. There is no rebound and no guarding. EXAM performed with patient in the upright position Musculoskeletal: Normal range of motion. [name] exhibits no edema.   Lymphadenopathy:    [name] has no cervical adenopathy.  Neurological: [name] is alert and oriented to person, place, and time.No cranial nerve deficit.  Skin: Skin is warm and dry. No rash noted.  Psychiatric: Mood, memory, affect and judgment normal.  Nursing note and vitals reviewed.   LABORATORY DATA: I have reviewed the data as listed.   CBC    Component Value Date/Time   WBC 11.8* 12/22/2015 1230   RBC 5.63* 12/22/2015 1230   HGB 14.1 12/22/2015 1230   HCT 45.9 12/22/2015 1230   PLT 311 12/22/2015 1230   MCV 81.5 12/22/2015 1230   MCH 25.0* 12/22/2015 1230   MCHC 30.7 12/22/2015 1230   RDW 16.8* 12/22/2015 1230   LYMPHSABS 2.7 12/22/2015 1230   MONOABS 1.1* 12/22/2015 1230   EOSABS 0.5 12/22/2015 1230   BASOSABS 0.1 12/22/2015 1230   CMP     Component Value Date/Time   NA 139 01/21/2015 1430   K 3.4* 01/21/2015 1430   CL 98 01/21/2015 1430   CO2 30 01/21/2015 1430   GLUCOSE 149* 01/21/2015 1430   BUN 20 01/21/2015 1430    CREATININE 0.83 01/21/2015 1430   CALCIUM 9.1 01/21/2015 1430   PROT 7.2 01/21/2015 1430   ALBUMIN 3.5 01/21/2015 1430   AST 16 01/21/2015 1430   ALT 18 01/21/2015 1430   ALKPHOS 79 01/21/2015 1430   BILITOT 0.8 01/21/2015 1430   GFRNONAA 67* 01/21/2015 1430   GFRAA 78* 01/21/2015 1430     Pathology: I have personally reviewed the radiological images as listed and agreed with the findings in the report.       ASSESSMENT and THERAPY PLAN:  Iron deficiency anemia due to chronic blood loss Ferritin on 03/22/2014 at 11 ng/ml Colonoscopy 09/07/2009 with severe diverticulosis in the sigmoid to descending, sessile polyp EGD 02/17/2015 with gastritis, obtained biopsies all negative Leukocytosis, negative flow cytometry and BCR-ABL  We will continue with ongoing observation and replace iron as needed. [name] will return for labs only in 6 weeks. [name] will return in 3 months for additional blood work and follow-up.   All questions were answered. The patient knows to call the clinic with any problems, questions or concerns. We can certainly see the patient much sooner if necessary.  This document serves as a record of services personally performed by Ancil Linsey, MD. It was created on her behalf by Kandace Blitz, a trained medical scribe. The creation of this record is based on the scribe's personal observations and the provider's statements to them. This document has been checked and approved by the attending provider. I have reviewed the above documentation for accuracy and completeness, and I agree with the above.  Molli Hazard, MD  12/22/2015

## 2015-12-22 NOTE — Patient Instructions (Addendum)
[place] at [facility] Discharge Instructions  RECOMMENDATIONS MADE BY THE CONSULTANT AND ANY TEST RESULTS WILL BE SENT TO YOUR REFERRING PHYSICIAN.   Exam and discussion by Dr Whitney Muse today Lab work is good, iron levels are pending, we will call you with those results  Mention to your neurologist about your head and neck symptoms (tongue issues) Labs every 6 weeks Return to see the doctor in 3 months Please call the clinic if you have any questions or concerns    Thank you for choosing [place] at [facility] to provide your oncology and hematology care.  To afford each patient quality time with our provider, please arrive at least 15 minutes before your scheduled appointment time.   Beginning January 23rd 2017 lab work for the [facility] will be done in the  Main lab at [facility] on 1st floor. If you have a lab appointment with the [facility] please come in thru the  Main Entrance and check in at the main information desk  You need to re-schedule your appointment should you arrive 10 or more minutes late.  We strive to give you quality time with our providers, and arriving late affects you and other patients whose appointments are after yours.  Also, if you no show three or more times for appointments you may be dismissed from the clinic at the providers discretion.     Again, thank you for choosing [facility].  Our hope is that these requests will decrease the amount of time that you wait before being seen by our physicians.       _____________________________________________________________  Should you have questions after your visit to [facility], please contact our office at [phone] between the hours of 8:30 a.m. and 4:30 p.m.  Voicemails left after 4:30 p.m. will not be returned until the following business day.  For prescription refill requests, have your pharmacy contact our office.          Resources For Cancer Patients and their Caregivers ? American Cancer Society: Can assist with transportation, wigs, general needs, runs Look Good Feel Better.        8646497139 ? Cancer Care: Provides financial assistance, online support groups, medication/co-pay assistance.  1-800-813-HOPE 319-732-7837) ? [facility] Assists [place] cancer patients and their families through emotional , educational and financial support.  986-676-7544 ? [place] DSS Where to apply for food stamps, Medicaid and utility assistance. 332-451-8093 ? [employer]ATS: Transportation to medical appointments. (979)109-3602 ? Social Security Administration: May apply for disability if have a Stage IV cancer. 860 052 [phone] ? [employer], Disability and Transit Services: Assists with nutrition, care and transit needs. [phone]

## 2015-12-30 ENCOUNTER — Encounter: Payer: Self-pay | Admitting: Gastroenterology

## 2016-01-12 ENCOUNTER — Encounter: Payer: Self-pay | Admitting: Podiatry

## 2016-01-12 ENCOUNTER — Ambulatory Visit (INDEPENDENT_AMBULATORY_CARE_PROVIDER_SITE_OTHER): Payer: Medicare Other | Admitting: Podiatry

## 2016-01-12 DIAGNOSIS — M79676 Pain in unspecified toe(s): Secondary | ICD-10-CM

## 2016-01-12 DIAGNOSIS — B351 Tinea unguium: Secondary | ICD-10-CM | POA: Diagnosis not present

## 2016-01-12 DIAGNOSIS — E1140 Type 2 diabetes mellitus with diabetic neuropathy, unspecified: Secondary | ICD-10-CM | POA: Diagnosis not present

## 2016-01-12 NOTE — Progress Notes (Signed)
Patient ID: [name], female   DOB: 03/14/1941, 74 y.o.   MRN: [id number] Complaint:  Visit Type: Patient returns to my office for continued preventative foot care services. Complaint: Patient states" my nails have grown long and thick and become painful to walk and wear shoes" Patient has been diagnosed with DM with no foot complications. The patient presents for preventative foot care services. No changes to ROS  Podiatric Exam: Vascular: dorsalis pedis and posterior tibial pulses are palpable bilateral. Capillary return is immediate. Temperature gradient is WNL. Skin turgor WNL  Sensorium: Diminished  Semmes Weinstein monofilament test. Normal tactile sensation bilaterally. Nail Exam: Pt has thick disfigured discolored nails with subungual debris noted bilateral entire nail hallux through fifth toenails Ulcer Exam: There is no evidence of ulcer or pre-ulcerative changes or infection. Orthopedic Exam: Muscle tone and strength are WNL. No limitations in general ROM. No crepitus or effusions noted. Foot type and digits show no abnormalities. Bony prominences are unremarkable. Skin: No Porokeratosis. No infection or ulcers  Diagnosis:  Onychomycosis, , Pain in right toe, pain in left toes  Treatment & Plan Procedures and Treatment: Consent by patient was obtained for treatment procedures. The patient understood the discussion of treatment and procedures well. All questions were answered thoroughly reviewed. Debridement of mycotic and hypertrophic toenails, 1 through 5 bilateral and clearing of subungual debris. No ulceration, no infection noted.  Return Visit-Office Procedure: Patient instructed to return to the office for a follow up visit 3 months for continued evaluation and treatment.    Bronda Alfred DPM 

## 2016-01-27 ENCOUNTER — Encounter (HOSPITAL_COMMUNITY): Payer: Self-pay | Admitting: Hematology & Oncology

## 2016-02-02 ENCOUNTER — Encounter (HOSPITAL_COMMUNITY): Payer: Medicare Other | Attending: Hematology & Oncology

## 2016-02-02 DIAGNOSIS — D473 Essential (hemorrhagic) thrombocythemia: Secondary | ICD-10-CM | POA: Diagnosis not present

## 2016-02-02 DIAGNOSIS — D500 Iron deficiency anemia secondary to blood loss (chronic): Secondary | ICD-10-CM | POA: Diagnosis present

## 2016-02-02 DIAGNOSIS — D72829 Elevated white blood cell count, unspecified: Secondary | ICD-10-CM | POA: Diagnosis not present

## 2016-02-02 LAB — CBC WITH DIFFERENTIAL/PLATELET
BASOS ABS: 0.1 10*3/uL (ref 0.0–0.1)
BASOS PCT: 0 %
EOS ABS: 0.9 10*3/uL — AB (ref 0.0–0.7)
Eosinophils Relative: 7 %
HEMATOCRIT: 45 % (ref 36.0–46.0)
Hemoglobin: 13.6 g/dL (ref 12.0–15.0)
Lymphocytes Relative: 21 %
Lymphs Abs: 2.8 10*3/uL (ref 0.7–4.0)
MCH: 24.8 pg — ABNORMAL LOW (ref 26.0–34.0)
MCHC: 30.2 g/dL (ref 30.0–36.0)
MCV: 82 fL (ref 78.0–100.0)
MONO ABS: 0.7 10*3/uL (ref 0.1–1.0)
MONOS PCT: 5 %
NEUTROS ABS: 8.8 10*3/uL — AB (ref 1.7–7.7)
NEUTROS PCT: 67 %
Platelets: 314 10*3/uL (ref 150–400)
RBC: 5.49 MIL/uL — ABNORMAL HIGH (ref 3.87–5.11)
RDW: 16.7 % — AB (ref 11.5–15.5)
WBC: 13.2 10*3/uL — ABNORMAL HIGH (ref 4.0–10.5)

## 2016-02-02 LAB — FERRITIN: FERRITIN: 12 ng/mL (ref 11–307)

## 2016-02-03 ENCOUNTER — Other Ambulatory Visit (HOSPITAL_COMMUNITY): Payer: Self-pay | Admitting: Oncology

## 2016-02-06 ENCOUNTER — Encounter: Payer: Self-pay | Admitting: Neurology

## 2016-02-06 ENCOUNTER — Other Ambulatory Visit (INDEPENDENT_AMBULATORY_CARE_PROVIDER_SITE_OTHER): Payer: Medicare Other

## 2016-02-06 ENCOUNTER — Ambulatory Visit (INDEPENDENT_AMBULATORY_CARE_PROVIDER_SITE_OTHER): Payer: Medicare Other | Admitting: Neurology

## 2016-02-06 VITALS — BP 120/70 | HR 78 | Ht 64.0 in

## 2016-02-06 DIAGNOSIS — R29898 Other symptoms and signs involving the musculoskeletal system: Secondary | ICD-10-CM | POA: Insufficient documentation

## 2016-02-06 DIAGNOSIS — R202 Paresthesia of skin: Secondary | ICD-10-CM | POA: Diagnosis not present

## 2016-02-06 DIAGNOSIS — K146 Glossodynia: Secondary | ICD-10-CM | POA: Diagnosis not present

## 2016-02-06 LAB — SEDIMENTATION RATE: Sed Rate: 27 mm/hr — ABNORMAL HIGH (ref 0–22)

## 2016-02-06 LAB — FOLATE: Folate: 10.1 ng/mL (ref >5.9)

## 2016-02-06 NOTE — Progress Notes (Signed)
Follow-up Visit   Date: 02/06/2016    [name] MRN: [id number] DOB: 1941-04-30   Interim History: [name] is a 75 y.o. Caucasian female with iron deficient anemia, COPD, diabetes mellitus, and hypothyroidism returning to the clinic for follow-up of dropped head syndrome and new complaints of tongue soreness and pain.  The patient was accompanied to the clinic by husband who also provides collateral information.    History of present illness: Starting around September 2016, her niece brought it to her attention that her head was not upright. If she has something supporting her back, she is able to keep it upright. She went to a chiropractor who recommended she start exercises. She had not had neck PT. She has neck pain if she maintain neck flexion for prolonged period of time. There is no diurnal variation to her weakness.   She denies difficulty swallowing, slurred speech, double vision, or limb weakness. She does not have any muscle cramps or pain. She has been using a wheelchair for long distance for the past several years. She uses a walker at home because of knee pain. She sleeps in a lift chair for some time. She uses CPAP with oxygen at night for OSA.  She saw Dr. Carles Collet in January for evaluation who performed extensive evaluation including MRI brain, MRI cervical spine, NCS/EMG, and serology testing screening for myopathy, myasthenia gravis, and thyroid disease. Parkinson's disease was excluded based on her clinical exam.   UPDATE 02/06/2016:  She has been doing home PT and noticed that there has been some improvement with her neck weakness.  Her balance has also improved with PT.  She was able to only stand for 1 minute, but now about to stand for 3 minutes.    Today, she also complains of soreness of the tongue which has been present for greater than 6 months.  Pain is burning and constant.  She has associated lack of taste.  She has tried nystatin and  swish/swallow mouth rinse with no benefit. She denies any GERD.  Medications:  Current Outpatient Prescriptions on File Prior to Visit  Medication Sig Dispense Refill  . albuterol (PROVENTIL HFA;VENTOLIN HFA) 108 (90 BASE) MCG/ACT inhaler Inhale 2 puffs into the lungs every 4 (four) hours as needed for wheezing. 1 Inhaler 3  . aspirin 81 MG tablet Take 162 mg by mouth daily. Take 2 tablets, by mouth, every day    . budesonide-formoterol (SYMBICORT) 160-4.5 MCG/ACT inhaler Inhale 2 puffs into the lungs 2 (two) times daily.    . Calcium Carb-Cholecalciferol (CALCIUM 600 + D PO) Take 2 tablets by mouth daily.    . cyanocobalamin 1000 MCG tablet Take 1,000 mcg by mouth daily.    . furosemide (LASIX) 40 MG tablet Take 40 mg by mouth daily.     . Glucosamine-Chondroitin-Vit D3 1500-1200-800 MG-MG-UNIT PACK Take 2 tablets by mouth daily.    Marland Kitchen ipratropium-albuterol (DUONEB) 0.5-2.5 (3) MG/3ML SOLN Take 4 times daily for 2 days and then as needed for wheezing, shortness of breath. 360 mL 3  . JANUVIA 100 MG tablet Take 100 mg by mouth daily.     Marland Kitchen levothyroxine (SYNTHROID, LEVOTHROID) 50 MCG tablet Take 50 mcg by mouth daily.    . meloxicam (MOBIC) 7.5 MG tablet Take 7.5 mg by mouth daily.     . metFORMIN (GLUCOPHAGE) 500 MG tablet Take 1,000 mg by mouth 2 (two) times daily with a meal.     . montelukast (SINGULAIR) 10  MG tablet Take 10 mg by mouth daily.     Marland Kitchen nystatin (MYCOSTATIN) 100000 UNIT/ML suspension Take by mouth. Swish and spit    . nystatin (MYCOSTATIN) powder Apply topically as needed.    . nystatin cream (MYCOSTATIN) Apply topically as needed.    . polyethylene glycol (MIRALAX / GLYCOLAX) packet Take 17 g by mouth daily.    . traMADol (ULTRAM) 50 MG tablet Take 50 mg by mouth at bedtime as needed for moderate pain.     . Wheat Dextrin (BENEFIBER DRINK MIX PO) Take by mouth.     No current facility-administered medications on file prior to visit.    Allergies:  Allergies  Allergen  Reactions  . Adhesive [Tape] Hives and Rash  . Codeine Itching and Other (See Comments)  . Vancomycin Rash  . Zosyn [Piperacillin Sod-Tazobactam So] Rash    Review of Systems:  CONSTITUTIONAL: No fevers, chills, night sweats, or weight loss.  EYES: No visual changes or eye pain ENT: No hearing changes.  No history of nose bleeds.   RESPIRATORY: No cough, wheezing +shortness of breath.   CARDIOVASCULAR: Negative for chest pain, and palpitations.   GI: Negative for abdominal discomfort, blood in stools or black stools.  No recent change in bowel habits.   GU:  No history of incontinence.   MUSCLOSKELETAL: +history of joint pain or swelling.  No myalgias.   SKIN: Negative for lesions, rash, and itching.   ENDOCRINE: Negative for cold or heat intolerance, polydipsia or goiter.   PSYCH:  No depression or anxiety symptoms.   NEURO: As Above.   Vital Signs:  BP 120/70 mmHg  Pulse 78  Ht '5\' 4"'  (1.626 m)  SpO2 90%  Neurological Exam: MENTAL STATUS including orientation to time, place, person, recent and remote memory, attention span and concentration, language, and fund of knowledge is normal.  Speech is not dysarthric.  CRANIAL NERVES: Pupils equal round and reactive to light.  Normal conjugate, extra-ocular eye movements in all directions of gaze.  No ptosis. Normal facial sensation.  Face is symmetric. Palate elevates symmetrically.  Tongue is midline.  Fissured tongue. No exudates.    MOTOR:  Motor strength is 5/5 in all extremities, except neck extension is 5-/5.  Neck flexion is 5/5.  No atrophy, fasciculations or abnormal movements.  No pronator drift.  Tone is normal.    MSRs:  Reflexes are 3+/4 throughout and 2+/4 at Achilles  SENSORY:  Intact to vibration.  COORDINATION/GAIT: Gait not tested as patient is in wheelchair  Data: Labs 10/12/2015: SPEP with IFE no M protein, CRP 1.3*, MG panel negative, ACE 34, CK 34, TSH 3.47, aldolase Lab Results  Component Value Date    CKTOTAL 34 10/12/2015   CKMB 8.3* 10/11/2011   TROPONINI <0.30 10/11/2011   Lab Results  Component Value Date   HGBA1C 6.9* 06/07/2014   Lab Results  Component Value Date   TSH 3.47 10/12/2015   Lab Results  Component Value Date   VITAMINB12 >2000* 11/17/2015    NCS/EMG of the right arm and leg 10/25/2015: 1. The electrophysiologic findings are consistent with a length-dependent sensorimotor polyneuropathy, axon loss in type, affecting the right lower extremity. 2. Right median neuropathy at or distal to the wrist, consistent with the clinical diagnosis of carpal tunnel syndrome. Overall, these findings are moderate to severe in degree electrically. 3. Right ulnar neuropathy with slowing across the elbow, mild in degree electrically. 4. There is no evidence of a neuromuscular junction disorder, myopathy,  or cervical/lumbosacral radiculopathy affecting the right side.  MRI brain wo contrast 10/18/2015: No intracranial abnormality  MRI cervical spine wo contrast 10/18/2015: Limited study due to motion and body habitus. Disc osteophyte complex effacing the central thecal sac at C5-6 and C6-7. Mild left neural foraminl stenosis at C5-6 on the left.  IMPRESSION/PLAN: 1. Dropped head syndrome, improving  - Exam with isolated neck extensor weakness - NCS/EMG did not show evidence of myopathy, MND, or NMJ disorder.  - Normal CK, aldolase, SPEP with IFE, AChR antibodies, TSH - Although limited by poor quality, cervical spine imaging shows spinal canal stenosis which explains hyperreflexia and may also contribute to neck weakness.If there is progression of weakness, next step will be to repeat MRI cervical spine with contrast to look for abnormal enhancement of the muscles as well as nerve roots  - Continue home exercises  2.  Burning mouth syndrome  - Check ESR, ANA, RF, SSA/B, riboflavin, folate, vitamin B1.  Her ferritin is very low  and she is having iron infusions.  Certainly, this has been associated with burning mouth syndrome and will need to be considered as potential etiology if remaining labs return normal  - Start gabapentin 31m at bedtime for one week, then increase to 1 tablet twice daily  3. Diabetic polyneuropathy of the feet - contributing to gait ataxia - she denies painful paresthesias and no evidence of weakness  4. Right median neuropathy and ulnar neuropathy, asymptomatic  Return to clinic in 3 months   The duration of this appointment visit was 25 minutes of face-to-face time with the patient.  Greater than 50% of this time was spent in counseling, explanation of diagnosis, planning of further management, and coordination of care.   Thank you for allowing me to participate in patient's care.  If I can answer any additional questions, I would be pleased to do so.    Sincerely,    Analy Bassford K. PPosey Pronto DO

## 2016-02-06 NOTE — Patient Instructions (Addendum)
1.  Start gabapentin 300mg  at bedtime for one week, then increase to 1 tablet twice daily 2.  Check blood work 3.  Continue home exercises for neck extension  Return to clinic in 3 months

## 2016-02-07 LAB — SJOGREN'S SYNDROME ANTIBODS(SSA + SSB)
SSA (Ro) (ENA) Antibody, IgG: <1
SSB (La) (ENA) Antibody, IgG: <1

## 2016-02-07 LAB — RHEUMATOID FACTOR: Rhuematoid fact SerPl-aCnc: <10 IU/mL (ref <=14)

## 2016-02-08 LAB — ANA: ANA: NEGATIVE

## 2016-02-09 ENCOUNTER — Encounter (HOSPITAL_BASED_OUTPATIENT_CLINIC_OR_DEPARTMENT_OTHER): Payer: Medicare Other

## 2016-02-09 ENCOUNTER — Encounter (HOSPITAL_COMMUNITY): Payer: Self-pay

## 2016-02-09 VITALS — BP 149/61 | HR 83 | Temp 97.9°F | Resp 183

## 2016-02-09 DIAGNOSIS — D500 Iron deficiency anemia secondary to blood loss (chronic): Secondary | ICD-10-CM

## 2016-02-09 MED ORDER — FERUMOXYTOL INJECTION 510 MG/17 ML
510.0000 mg | Freq: Once | INTRAVENOUS | Status: AC
  Administered 2016-02-09: 510 mg via INTRAVENOUS
  Filled 2016-02-09: qty 17

## 2016-02-09 MED ORDER — SODIUM CHLORIDE 0.9 % IV SOLN
Freq: Once | INTRAVENOUS | Status: AC
  Administered 2016-02-09: 15:00:00 via INTRAVENOUS

## 2016-02-09 NOTE — Patient Instructions (Signed)
[facility] at [facility] Discharge Instructions  RECOMMENDATIONS MADE BY THE CONSULTANT AND ANY TEST RESULTS WILL BE SENT TO YOUR REFERRING PHYSICIAN.    Iron infusion today Follow up as scheduled   Thank you for choosing [facility] at [facility] to provide your oncology and hematology care.  To afford each patient quality time with our provider, please arrive at least 15 minutes before your scheduled appointment time.   Beginning January 23rd 2017 lab work for the Cancer Center will be done in the  Main lab at [place] on 1st floor. If you have a lab appointment with the Cancer Center please come in thru the  Main Entrance and check in at the main information desk  You need to re-schedule your appointment should you arrive 10 or more minutes late.  We strive to give you quality time with our providers, and arriving late affects you and other patients whose appointments are after yours.  Also, if you no show three or more times for appointments you may be dismissed from the clinic at the providers discretion.     Again, thank you for choosing [facility].  Our hope is that these requests will decrease the amount of time that you wait before being seen by our physicians.       _____________________________________________________________  Should you have questions after your visit to  Cancer Center, please contact our office at [phone] between the hours of 8:30 a.m. and 4:30 p.m.  Voicemails left after 4:30 p.m. will not be returned until the following business day.  For prescription refill requests, have your pharmacy contact our office.         Resources For Cancer Patients and their Caregivers ? American Cancer Society: Can assist with transportation, wigs, general needs, runs Look Good Feel Better.        1-888-227-6333 ? Cancer Care: Provides financial assistance, online support groups,  medication/co-pay assistance.  1-800-813-HOPE (4673) ? [facility] Assists [place] cancer patients and their families through emotional , educational and financial support.  336-427-4357 ? [place] DSS Where to apply for food stamps, Medicaid and utility assistance. 336-342-1394 ? [employer]: Transportation to medical appointments. 336-347-2287 ? Social Security Administration: May apply for disability if have a Stage IV cancer. 336-342-7796 1-800-772-1213 ? [place] A[employer], Disability and Transit Services: Assists with nutrition, care and transit needs. 336-349-2343  Cancer Center Support Programs: [email or web address]@ > Cancer Support Group  2nd Tuesday of the month 1pm-2pm, Journey Room  > Creative Journey  3rd Tuesday of the month 1130am-1pm, Journey Room  > Look Good Feel Better  1st Wednesday of the month 10am-12 noon, Journey Room (Call American Cancer Society to register 1-800-395-5775)    

## 2016-02-09 NOTE — Progress Notes (Signed)
[name] [place] Tolerated iron infusion well  Discharged after 30 minute wait period

## 2016-02-10 LAB — VITAMIN B2(RIBOFLAVIN),PLASMA: VITAMIN B2(RIBOFLAVIN),PLASMA: 11.2 nmol/L (ref 6.2–39.0)

## 2016-02-11 LAB — VITAMIN B1: VITAMIN B1 (THIAMINE): 25 nmol/L (ref 8–30)

## 2016-02-13 ENCOUNTER — Ambulatory Visit: Payer: Medicare Other | Admitting: Neurology

## 2016-03-15 ENCOUNTER — Other Ambulatory Visit (HOSPITAL_COMMUNITY): Payer: Medicare Other

## 2016-03-23 ENCOUNTER — Encounter (HOSPITAL_COMMUNITY): Payer: Medicare Other | Attending: Hematology & Oncology | Admitting: Hematology & Oncology

## 2016-03-23 ENCOUNTER — Encounter (HOSPITAL_COMMUNITY): Payer: Self-pay | Admitting: Hematology & Oncology

## 2016-03-23 ENCOUNTER — Encounter (HOSPITAL_COMMUNITY): Payer: Medicare Other

## 2016-03-23 VITALS — HR 76 | Temp 98.2°F | Resp 16

## 2016-03-23 DIAGNOSIS — D500 Iron deficiency anemia secondary to blood loss (chronic): Secondary | ICD-10-CM | POA: Diagnosis present

## 2016-03-23 DIAGNOSIS — D72829 Elevated white blood cell count, unspecified: Secondary | ICD-10-CM

## 2016-03-23 DIAGNOSIS — D473 Essential (hemorrhagic) thrombocythemia: Secondary | ICD-10-CM | POA: Insufficient documentation

## 2016-03-23 LAB — CBC WITH DIFFERENTIAL/PLATELET
Basophils Absolute: 0.1 10*3/uL (ref 0.0–0.1)
Basophils Relative: 0 %
Eosinophils Absolute: 0.6 10*3/uL (ref 0.0–0.7)
Eosinophils Relative: 4 %
HEMATOCRIT: 45.2 % (ref 36.0–46.0)
HEMOGLOBIN: 14 g/dL (ref 12.0–15.0)
LYMPHS ABS: 2.4 10*3/uL (ref 0.7–4.0)
LYMPHS PCT: 18 %
MCH: 25.9 pg — AB (ref 26.0–34.0)
MCHC: 31 g/dL (ref 30.0–36.0)
MCV: 83.7 fL (ref 78.0–100.0)
MONOS PCT: 5 %
Monocytes Absolute: 0.7 10*3/uL (ref 0.1–1.0)
NEUTROS ABS: 9.8 10*3/uL — AB (ref 1.7–7.7)
NEUTROS PCT: 73 %
Platelets: 305 10*3/uL (ref 150–400)
RBC: 5.4 MIL/uL — AB (ref 3.87–5.11)
RDW: 17.1 % — ABNORMAL HIGH (ref 11.5–15.5)
WBC: 13.5 10*3/uL — AB (ref 4.0–10.5)

## 2016-03-23 LAB — FERRITIN: FERRITIN: 18 ng/mL (ref 11–307)

## 2016-03-23 NOTE — Progress Notes (Signed)
Michelle Kilts, MD [place] [id number]    DIAGNOSIS:  Iron deficiency anemia due to chronic blood loss Ferritin on 03/22/2014 at 11 ng/ml Colonoscopy 09/07/2009 with severe diverticulosis in the sigmoid to descending, sessile polyp  Postoperative MRSA infection of lower abdominal wound, sequela Exploratory laparotomy, sigmoid colectomy with end colostomy with Dr. [name], secondary to a perforated sigmoid diverticulitis.  OBSTRUCTIVE SLEEP APNEA  Thrombocytosis, reactive  CURRENT THERAPY: IV iron last given on 02/09/2016  INTERVAL HISTORY: [name] 75 y.o. female returns for follow-up of her iron deficiency. She denies any BRBPR or black, tarry stool. No hematuria.   [name] is accompanied by a caretaker today. I personally reviewed and went over laboratory studies with the patient.  She feels sore all over after having the flu, she believes this may be arthritis related.  Her tongue continues to burn. She was put on gabapentin, hoping it would help. Instead this medication made her feel like she had Alzheimer's so she stopped taking it. She is curious if she can be referred to anyone for it. She sometimes has to eat really soft foods to avoid discomfort.   Her neck has improved. She has been diagnosed with dropped head syndrome. She follows with Dr. Posey Pronto. She has completed PT. She notes that she realistically has no other major complaints. Appetite is unchanged. Energy is her baseline.   MEDICAL HISTORY: Past Medical History  Diagnosis Date  . Hypertension   . Diabetes mellitus   . Arthritis   . Anemia   . Diverticulitis   . Shortness of breath     with exertion   . Sleep apnea     cpap at 10   . Bronchitis 05/2014  . Cellulitis of lower extremity 06/2014    bilateral  . Tubular adenoma of colon   . Gallstone   . MRSA (methicillin resistant Staphylococcus aureus)     has now "tested negative"- no open wounds as of 02-10-15  .  COPD (chronic obstructive pulmonary disease) ([place])   . Obesity   . Sleep apnea   . Thyroid disease     has OBSTRUCTIVE SLEEP APNEA; Diverticulitis of colon with perforation sigmoid colectomy and colostomy 1/15; Hypertension; Diabetes mellitus ([place]); Obesity, Class III, BMI 40-49.9 (morbid obesity) ([place]); S/P colon resection; Draining postoperative wound; Cellulitis; Incisional hernia without mention of obstruction or gangrene; Thrombocytosis ([place]); Postoperative MRSA infection of lower abdominal wound; Iron deficiency anemia due to chronic blood loss; COPD exacerbation ([place]); Hypoxia; Leukocytosis; IDA (iron deficiency anemia); Burning mouth syndrome; and Dropped head syndrome on her problem list.     is allergic to adhesive; codeine; vancomycin; and zosyn.  [name] does not currently have medications on file.  SURGICAL HISTORY: Past Surgical History  Procedure Laterality Date  . Appendectomy    . Back surgery    . Colon resection  10/09/2011    Procedure: COLON RESECTION;  Surgeon: Gayland Curry, MD;  Location: [facility];  Service: General;  Laterality: N/A;  Colon Resection with colostomy  . Jackson pratt    . Application of wound vac  wound vac  . Abdominal hysterectomy  1979  . Proctoscopy  06/26/2012    Procedure: PROCTOSCOPY;  Surgeon: Gayland Curry, MD,FACS;  Location: [facility];  Service: General;  Laterality: N/A;  Rigid Proctoscopy  . Colon resection  06/26/2012    Procedure: COLON RESECTION;  Surgeon: Gayland Curry, MD,FACS;  Location: [facility];  Service: General;  Laterality: N/A;  . Application of wound vac  06/26/2012    Procedure: APPLICATION OF WOUND VAC;  Surgeon: Gayland Curry, MD,FACS;  Location: [facility];  Service: General;;  . Abdominal hysterectomy  1978  . Esophagogastroduodenoscopy (egd) with propofol N/A 02/17/2015    Procedure: ESOPHAGOGASTRODUODENOSCOPY (EGD) WITH PROPOFOL;  Surgeon: Jerene Bears, MD;  Location: [facility];  Service: Gastroenterology;  Laterality: N/A;     SOCIAL HISTORY: Social History   Social History  . Marital Status: Married    Spouse Name: N/A  . Number of Children: 0  . Years of Education: N/A   Occupational History  . Retired    Social History Main Topics  . Smoking status: Former Smoker    Quit date: 09/24/2010  . Smokeless tobacco: Never Used  . Alcohol Use: No  . Drug Use: No  . Sexual Activity: Not Currently    Birth Control/ Protection: Surgical   Other Topics Concern  . Not on file   Social History Narrative  No children   FAMILY HISTORY: Family History  Problem Relation Age of Onset  . Heart disease Father   . Diabetes Father   . Hypertension Father   . Heart disease Brother   . Diabetes Brother   . Diabetes Mother   . Hypertension Mother   . Diabetes Sister   . Colon cancer Neg Hx   . Colon polyps Neg Hx   . Esophageal cancer Neg Hx   . Kidney disease Neg Hx   . Gallbladder disease Neg Hx     Review of Systems  Constitutional: Negative.   HENT: Positive for dry mouth and tongue pain.   Tongue is sore and burns. Dry mouth is helped by chewing gum.  Eyes:       Wears glasses  Respiratory: Negative.        SOB with exertion  Cardiovascular: Negative.   Gastrointestinal: Negative.   Genitourinary: Negative.   Musculoskeletal: Positive for joint pain and hip pain.      Bad right knee Uses walker at home  Skin: Negative.   Neurological: Negative.   Endo/Heme/Allergies: Negative.   Psychiatric/Behavioral: Negative.   14 point review of systems was performed and is negative except as detailed under history of present illness and above   PHYSICAL EXAMINATION ECOG PERFORMANCE STATUS: 1 - Symptomatic but completely ambulatory  Filed Vitals:   03/23/16 1351  Pulse: 76  Temp: 98.2 F (36.8 C)  Resp: 16   Physical Exam  Constitutional: She is oriented to person, place, and time and well-developed, well-nourished, and in no distress. In wheelchair HENT:  Head: Normocephalic and  atraumatic.  Nose: Nose normal.  Mouth/Throat: Oropharynx is clear and moist. No oropharyngeal exudate.  Eyes: Conjunctivae and EOM are normal. Pupils are equal, round, and reactive to light. Right eye exhibits no discharge. Left eye exhibits no discharge. No scleral icterus.  Neck: Normal range of motion. Neck supple. No tracheal deviation present. No thyromegaly present.  Cardiovascular: Normal rate, regular rhythm and normal heart sounds.  Exam reveals no gallop and no friction rub.   No murmur heard. Pulmonary/Chest: Effort normal and breath sounds normal. She has no wheezes. She has no rales.  Abdominal: Soft. Bowel sounds are normal. She exhibits no distension and no mass. There is no tenderness. There is no rebound and no guarding. EXAM performed with patient in the upright position Musculoskeletal: Normal range of motion. She exhibits no edema.   Lymphadenopathy:    She has  no cervical adenopathy.  Neurological: She is alert and oriented to person, place, and time.No cranial nerve deficit.  Skin: Skin is warm and dry. No rash noted.  Psychiatric: Mood, memory, affect and judgment normal.  Nursing note and vitals reviewed.   LABORATORY DATA: I have reviewed the data as listed.   CBC    Component Value Date/Time   WBC 13.5* 03/23/2016 1251   RBC 5.40* 03/23/2016 1251   HGB 14.0 03/23/2016 1251   HCT 45.2 03/23/2016 1251   PLT 305 03/23/2016 1251   MCV 83.7 03/23/2016 1251   MCH 25.9* 03/23/2016 1251   MCHC 31.0 03/23/2016 1251   RDW 17.1* 03/23/2016 1251   LYMPHSABS 2.4 03/23/2016 1251   MONOABS 0.7 03/23/2016 1251   EOSABS 0.6 03/23/2016 1251   BASOSABS 0.1 03/23/2016 1251   CMP     Component Value Date/Time   NA 139 01/21/2015 1430   K 3.4* 01/21/2015 1430   CL 98 01/21/2015 1430   CO2 30 01/21/2015 1430   GLUCOSE 149* 01/21/2015 1430   BUN 20 01/21/2015 1430   CREATININE 0.83 01/21/2015 1430   CALCIUM 9.1 01/21/2015 1430   PROT 7.2 01/21/2015 1430   ALBUMIN  3.5 01/21/2015 1430   AST 16 01/21/2015 1430   ALT 18 01/21/2015 1430   ALKPHOS 79 01/21/2015 1430   BILITOT 0.8 01/21/2015 1430   GFRNONAA 67* 01/21/2015 1430   GFRAA 78* 01/21/2015 1430   Results for JENDAYA, MARCHENKO (MRN [id number])   Ref. Range 06/02/2015 12:25 07/14/2015 12:29 08/25/2015 13:26 11/17/2015 12:13 12/22/2015 12:30 02/02/2016 14:18 03/23/2016 12:51  Ferritin Latest Ref Range: 11 - 307 ng/mL 29 19 23 14 14 12 18     Pathology: I have personally reviewed the radiological images as listed and agreed with the findings in the report.       ASSESSMENT and THERAPY PLAN:  Iron deficiency anemia due to chronic blood loss Ferritin on 03/22/2014 at 11 ng/ml Colonoscopy 09/07/2009 with severe diverticulosis in the sigmoid to descending, sessile polyp EGD 02/17/2015 with gastritis, obtained biopsies all negative Leukocytosis, negative flow cytometry and BCR-ABL  We will continue with ongoing observation and replace iron as needed. She will return for labs only in 6 weeks. She will return in 3 months for additional blood work and follow-up.   I personally reviewed and went over laboratory studies with the patient.  Her iron requirements are not insignificant and I questions whether she needs a referral back to her Gastroenterologist. We will address at follow-up. Last colonoscopy was in 2010. Last EGD 01/2015.   All questions were answered. The patient knows to call the clinic with any problems, questions or concerns. We can certainly see the patient much sooner if necessary.  This document serves as a record of services personally performed by Ancil Linsey, MD. It was created on her behalf by Arlyce Harman, a trained medical scribe. The creation of this record is based on the scribe's personal observations and the provider's statements to them. This document has been checked and approved by the attending provider.  I have reviewed the above documentation for accuracy and  completeness, and I agree with the above.  Molli Hazard, MD  03/23/2016

## 2016-03-23 NOTE — Patient Instructions (Signed)
[place] at [facility] Discharge Instructions  RECOMMENDATIONS MADE BY THE CONSULTANT AND ANY TEST RESULTS WILL BE SENT TO YOUR REFERRING PHYSICIAN.  You were seen by Dr. Whitney Muse today. Return to clinic in 3 months for follow-up and lab work. Call clinic with any questions or concerns.   Thank you for choosing [place] at [facility] to provide your oncology and hematology care.  To afford each patient quality time with our provider, please arrive at least 15 minutes before your scheduled appointment time.   Beginning January 23rd 2017 lab work for the [facility] will be done in the  Main lab at [facility] on 1st floor. If you have a lab appointment with the [place] please come in thru the  Main Entrance and check in at the main information desk  You need to re-schedule your appointment should you arrive 10 or more minutes late.  We strive to give you quality time with our providers, and arriving late affects you and other patients whose appointments are after yours.  Also, if you no show three or more times for appointments you may be dismissed from the clinic at the providers discretion.     Again, thank you for choosing [facility].  Our hope is that these requests will decrease the amount of time that you wait before being seen by our physicians.       _____________________________________________________________  Should you have questions after your visit to [facility], please contact our office at [phone] between the hours of 8:30 a.m. and 4:30 p.m.  Voicemails left after 4:30 p.m. will not be returned until the following business day.  For prescription refill requests, have your pharmacy contact our office.         Resources For Cancer Patients and their Caregivers ? American Cancer Society: Can assist with transportation, wigs, general needs, runs Look Good Feel Better.         564-754-9818 ? Cancer Care: Provides financial assistance, online support groups, medication/co-pay assistance.  1-800-813-HOPE 281-676-2597) ? [place] Assists [place] cancer patients and their families through emotional , educational and financial support.  684 023 5039 ? [place] DSS Where to apply for food stamps, Medicaid and utility assistance. ([phone] ? RCATS: Transportation to medical appointments. ([phone] ? Social Security Administration: May apply for disability if have a Stage IV cancer. [phone] 570-216-6257 ? [employer], Disability and Transit Services: Assists with nutrition, care and transit needs. [place] Support Programs: @10RELATIVEDAYS @ > Cancer Support Group  2nd Tuesday of the month 1pm-2pm, Journey Room  > Creative Journey  3rd Tuesday of the month 1130am-1pm, Journey Room  > Look Good Feel Better  1st Wednesday of the month 10am-12 noon, Journey Room (Call [place] to register [phone])

## 2016-03-26 ENCOUNTER — Other Ambulatory Visit (HOSPITAL_COMMUNITY): Payer: Self-pay | Admitting: Hematology & Oncology

## 2016-04-12 ENCOUNTER — Encounter: Payer: Self-pay | Admitting: Podiatry

## 2016-04-12 ENCOUNTER — Ambulatory Visit (INDEPENDENT_AMBULATORY_CARE_PROVIDER_SITE_OTHER): Payer: Medicare Other | Admitting: Podiatry

## 2016-04-12 DIAGNOSIS — B351 Tinea unguium: Secondary | ICD-10-CM | POA: Diagnosis not present

## 2016-04-12 DIAGNOSIS — E1140 Type 2 diabetes mellitus with diabetic neuropathy, unspecified: Secondary | ICD-10-CM

## 2016-04-12 DIAGNOSIS — M79676 Pain in unspecified toe(s): Secondary | ICD-10-CM

## 2016-04-12 NOTE — Progress Notes (Signed)
Patient ID: [name], female   DOB: Apr 18, 1941, 75 y.o.   MRN: [id number] Complaint:  Visit Type: Patient returns to my office for continued preventative foot care services. Complaint: Patient states" my nails have grown long and thick and become painful to walk and wear shoes" Patient has been diagnosed with DM with no foot complications. The patient presents for preventative foot care services. No changes to ROS  Podiatric Exam: Vascular: dorsalis pedis and posterior tibial pulses are palpable bilateral. Capillary return is immediate. Temperature gradient is WNL. Skin turgor WNL  Sensorium: Diminished  Semmes Weinstein monofilament test. Normal tactile sensation bilaterally. Nail Exam: Pt has thick disfigured discolored nails with subungual debris noted bilateral entire nail hallux through fifth toenails Ulcer Exam: There is no evidence of ulcer or pre-ulcerative changes or infection. Orthopedic Exam: Muscle tone and strength are WNL. No limitations in general ROM. No crepitus or effusions noted. Foot type and digits show no abnormalities. Bony prominences are unremarkable. Skin: No Porokeratosis. No infection or ulcers  Diagnosis:  Onychomycosis, , Pain in right toe, pain in left toes  Treatment & Plan Procedures and Treatment: Consent by patient was obtained for treatment procedures. The patient understood the discussion of treatment and procedures well. All questions were answered thoroughly reviewed. Debridement of mycotic and hypertrophic toenails, 1 through 5 bilateral and clearing of subungual debris. No ulceration, no infection noted.  Return Visit-Office Procedure: Patient instructed to return to the office for a follow up visit 3 months for continued evaluation and treatment.    Gardiner Barefoot DPM

## 2016-04-13 ENCOUNTER — Telehealth: Payer: Self-pay | Admitting: Neurology

## 2016-04-13 ENCOUNTER — Encounter (HOSPITAL_COMMUNITY): Payer: Medicare Other | Attending: Hematology & Oncology

## 2016-04-13 ENCOUNTER — Encounter (HOSPITAL_COMMUNITY): Payer: Self-pay

## 2016-04-13 VITALS — BP 166/60 | HR 71 | Temp 97.5°F | Resp 18

## 2016-04-13 DIAGNOSIS — D72829 Elevated white blood cell count, unspecified: Secondary | ICD-10-CM | POA: Insufficient documentation

## 2016-04-13 DIAGNOSIS — D500 Iron deficiency anemia secondary to blood loss (chronic): Secondary | ICD-10-CM | POA: Diagnosis not present

## 2016-04-13 DIAGNOSIS — D473 Essential (hemorrhagic) thrombocythemia: Secondary | ICD-10-CM | POA: Insufficient documentation

## 2016-04-13 MED ORDER — SODIUM CHLORIDE 0.9 % IV SOLN
INTRAVENOUS | Status: DC
  Administered 2016-04-13: 14:00:00 via INTRAVENOUS

## 2016-04-13 MED ORDER — SODIUM CHLORIDE 0.9 % IV SOLN
510.0000 mg | Freq: Once | INTRAVENOUS | Status: AC
  Administered 2016-04-13: 510 mg via INTRAVENOUS
  Filled 2016-04-13: qty 17

## 2016-04-13 NOTE — Patient Instructions (Signed)
[place] at [facility] Discharge Instructions  RECOMMENDATIONS MADE BY THE CONSULTANT AND ANY TEST RESULTS WILL BE SENT TO YOUR REFERRING PHYSICIAN.  Iron infusion today. Return as scheduled for lab work. Return as scheduled for office visit.   Thank you for choosing [place] at [facility] to provide your oncology and hematology care.  To afford each patient quality time with our provider, please arrive at least 15 minutes before your scheduled appointment time.   Beginning January 23rd 2017 lab work for the [facility] will be done in the  Main lab at [facility] on 1st floor. If you have a lab appointment with the [place] please come in thru the  Main Entrance and check in at the main information desk  You need to re-schedule your appointment should you arrive 10 or more minutes late.  We strive to give you quality time with our providers, and arriving late affects you and other patients whose appointments are after yours.  Also, if you no show three or more times for appointments you may be dismissed from the clinic at the providers discretion.     Again, thank you for choosing [facility].  Our hope is that these requests will decrease the amount of time that you wait before being seen by our physicians.       _____________________________________________________________  Should you have questions after your visit to [facility], please contact our office at [phone] between the hours of 8:30 a.m. and 4:30 p.m.  Voicemails left after 4:30 p.m. will not be returned until the following business day.  For prescription refill requests, have your pharmacy contact our office.         Resources For Cancer Patients and their Caregivers ? American Cancer Society: Can assist with transportation, wigs, general needs, runs Look Good Feel Better.        681-644-5162 ? Cancer Care: Provides financial  assistance, online support groups, medication/co-pay assistance.  1-800-813-HOPE 2605653541) ? [place] Assists [place] cancer patients and their families through emotional , educational and financial support.  ([phone] ? [place] DSS Where to apply for food stamps, Medicaid and utility assistance. 936-488-8030 ? RCATS: Transportation to medical appointments. 573-455-1164 ? Social Security Administration: May apply for disability if have a Stage IV cancer. 209-474-0033 716-032-7331 ? [employer], Disability and Transit Services: Assists with nutrition, care and transit needs. [place] Support Programs: @10RELATIVEDAYS @ > Cancer Support Group  2nd Tuesday of the month 1pm-2pm, Journey Room  > Creative Journey  3rd Tuesday of the month 1130am-1pm, Journey Room  > Look Good Feel Better  1st Wednesday of the month 10am-12 noon, Journey Room (Call [place] to register [phone])

## 2016-04-13 NOTE — Telephone Encounter (Signed)
Patient wants to talk to someone please call [phone]

## 2016-04-13 NOTE — Telephone Encounter (Signed)
Noted  

## 2016-04-13 NOTE — Telephone Encounter (Signed)
Patient was on gabapentin for 30 days.  She said that she lost her memory so she stopped it.  She is going to make a follow up appointment to see you.

## 2016-04-17 ENCOUNTER — Encounter (HOSPITAL_COMMUNITY): Payer: Self-pay | Admitting: Hematology & Oncology

## 2016-04-19 MED ORDER — OCTREOTIDE ACETATE 30 MG IM KIT
PACK | INTRAMUSCULAR | Status: AC
  Filled 2016-04-19: qty 1

## 2016-04-26 ENCOUNTER — Other Ambulatory Visit (HOSPITAL_COMMUNITY): Payer: Medicare Other

## 2016-05-03 ENCOUNTER — Ambulatory Visit: Payer: Medicare Other | Admitting: Neurology

## 2016-05-10 ENCOUNTER — Ambulatory Visit: Payer: Medicare Other | Admitting: Neurology

## 2016-05-10 ENCOUNTER — Ambulatory Visit: Payer: Medicare Other | Admitting: Podiatry

## 2016-06-07 ENCOUNTER — Other Ambulatory Visit (HOSPITAL_COMMUNITY): Payer: Medicare Other

## 2016-06-22 ENCOUNTER — Ambulatory Visit (HOSPITAL_COMMUNITY): Payer: Medicare Other | Admitting: Hematology & Oncology

## 2016-06-22 ENCOUNTER — Other Ambulatory Visit (HOSPITAL_COMMUNITY): Payer: Medicare Other

## 2016-07-05 ENCOUNTER — Encounter (HOSPITAL_COMMUNITY): Payer: Self-pay

## 2016-07-05 ENCOUNTER — Encounter (HOSPITAL_COMMUNITY): Payer: Medicare Other | Attending: Oncology | Admitting: Oncology

## 2016-07-05 ENCOUNTER — Encounter (HOSPITAL_COMMUNITY): Payer: Medicare Other

## 2016-07-05 VITALS — BP 147/80 | HR 119 | Temp 98.5°F | Resp 20 | Ht 65.0 in | Wt 315.0 lb

## 2016-07-05 DIAGNOSIS — D509 Iron deficiency anemia, unspecified: Secondary | ICD-10-CM | POA: Diagnosis present

## 2016-07-05 DIAGNOSIS — D500 Iron deficiency anemia secondary to blood loss (chronic): Secondary | ICD-10-CM

## 2016-07-05 DIAGNOSIS — D72829 Elevated white blood cell count, unspecified: Secondary | ICD-10-CM | POA: Diagnosis not present

## 2016-07-05 LAB — CBC WITH DIFFERENTIAL/PLATELET
BASOS ABS: 0 10*3/uL (ref 0.0–0.1)
BASOS PCT: 0 %
EOS ABS: 0.3 10*3/uL (ref 0.0–0.7)
Eosinophils Relative: 2 %
HEMATOCRIT: 45.6 % (ref 36.0–46.0)
Hemoglobin: 14.2 g/dL (ref 12.0–15.0)
Lymphocytes Relative: 21 %
Lymphs Abs: 2.6 10*3/uL (ref 0.7–4.0)
MCH: 25.6 pg — ABNORMAL LOW (ref 26.0–34.0)
MCHC: 31.1 g/dL (ref 30.0–36.0)
MCV: 82.2 fL (ref 78.0–100.0)
MONO ABS: 0.7 10*3/uL (ref 0.1–1.0)
Monocytes Relative: 6 %
NEUTROS ABS: 8.8 10*3/uL — AB (ref 1.7–7.7)
NEUTROS PCT: 71 %
Platelets: 373 10*3/uL (ref 150–400)
RBC: 5.55 MIL/uL — ABNORMAL HIGH (ref 3.87–5.11)
RDW: 15.4 % (ref 11.5–15.5)
WBC: 12.4 10*3/uL — ABNORMAL HIGH (ref 4.0–10.5)

## 2016-07-05 LAB — FERRITIN: FERRITIN: 9 ng/mL — AB (ref 11–307)

## 2016-07-05 NOTE — Patient Instructions (Signed)
[place] at [facility] Discharge Instructions  RECOMMENDATIONS MADE BY THE CONSULTANT AND ANY TEST RESULTS WILL BE SENT TO YOUR REFERRING PHYSICIAN.  You were seen by Dr. Oliva Bustard. Follow up in 6 months for labs and Dr. Whitney Muse.   Thank you for choosing [place] at [facility] to provide your oncology and hematology care.  To afford each patient quality time with our provider, please arrive at least 15 minutes before your scheduled appointment time.   Beginning January 23rd 2017 lab work for the [facility] will be done in the  Main lab at [facility] on 1st floor. If you have a lab appointment with the [place] please come in thru the  Main Entrance and check in at the main information desk  You need to re-schedule your appointment should you arrive 10 or more minutes late.  We strive to give you quality time with our providers, and arriving late affects you and other patients whose appointments are after yours.  Also, if you no show three or more times for appointments you may be dismissed from the clinic at the providers discretion.     Again, thank you for choosing [facility].  Our hope is that these requests will decrease the amount of time that you wait before being seen by our physicians.       _____________________________________________________________  Should you have questions after your visit to [facility], please contact our office at [phone] between the hours of 8:30 a.m. and 4:30 p.m.  Voicemails left after 4:30 p.m. will not be returned until the following business day.  For prescription refill requests, have your pharmacy contact our office.         Resources For Cancer Patients and their Caregivers ? American Cancer Society: Can assist with transportation, wigs, general needs, runs Look Good Feel Better.        870-733-5187 ? Cancer Care: Provides financial assistance, online  support groups, medication/co-pay assistance.  1-800-813-HOPE (587)826-4159) ? [place] Assists [place] cancer patients and their families through emotional , educational and financial support.  (313)645-0229 ? [place] DSS Where to apply for food stamps, Medicaid and utility assistance. 386 320 6924 ? RCATS: Transportation to medical appointments. 731-408-2914 ? Social Security Administration: May apply for disability if have a Stage IV cancer. 9592622563 445-826-9336 ? [employer], Disability and Transit Services: Assists with nutrition, care and transit needs. [place] Support Programs: @10RELATIVEDAYS @ > Cancer Support Group  2nd Tuesday of the month 1pm-2pm, Journey Room  > Creative Journey  3rd Tuesday of the month 1130am-1pm, Journey Room  > Look Good Feel Better  1st Wednesday of the month 10am-12 noon, Journey Room (Call [place] to register [phone])

## 2016-07-05 NOTE — Progress Notes (Signed)
Michelle Kilts, MD [place] [id number]    DIAGNOSIS:  Iron deficiency anemia due to chronic blood loss Ferritin on 03/22/2014 at 11 ng/ml Colonoscopy 09/07/2009 with severe diverticulosis in the sigmoid to descending, sessile polyp  Postoperative MRSA infection of lower abdominal wound, sequela Exploratory laparotomy, sigmoid colectomy with end colostomy with Dr. [name], secondary to a perforated sigmoid diverticulitis.  OBSTRUCTIVE SLEEP APNEA  Thrombocytosis, reactive  CURRENT THERAPY: IV iron last given on 02/09/2016  INTERVAL HISTORY: [name] 75 y.o. female returns for follow-up of her iron deficiency. She denies any BRBPR or black, tarry stool. No hematuria.   [name] is accompanied by a caretaker today. I personally reviewed and went over laboratory studies with the patient.  Patient is here for further follow-up regarding iron deficiency anemia.  Her hemoglobin is improved. Patient has a limited ambulation because of multiple other medical problem.  Patient also is morbidly obese. Rectal bleeding    MEDICAL HISTORY: Past Medical History:  Diagnosis Date  . Anemia   . Arthritis   . Bronchitis 05/2014  . Cellulitis of lower extremity 06/2014   bilateral  . COPD (chronic obstructive pulmonary disease) ([place])   . Diabetes mellitus   . Diverticulitis   . Gallstone   . Hypertension   . MRSA (methicillin resistant Staphylococcus aureus)    has now "tested negative"- no open wounds as of 02-10-15  . Obesity   . Shortness of breath    with exertion   . Sleep apnea    cpap at 10   . Sleep apnea   . Thyroid disease   . Tubular adenoma of colon     has OBSTRUCTIVE SLEEP APNEA; Diverticulitis of colon with perforation sigmoid colectomy and colostomy 1/15; Hypertension; Diabetes mellitus ([place]); Obesity, Class III, BMI 40-49.9 (morbid obesity) ([place]); S/P colon resection; Draining postoperative wound; Cellulitis; Incisional hernia  without mention of obstruction or gangrene; Thrombocytosis ([place]); Postoperative MRSA infection of lower abdominal wound; Iron deficiency anemia due to chronic blood loss; COPD exacerbation ([place]); Hypoxia; Leukocytosis; IDA (iron deficiency anemia); Burning mouth syndrome; and Dropped head syndrome on her problem list.     is allergic to adhesive [tape]; codeine; vancomycin; and zosyn [piperacillin sod-tazobactam so].  [name] had no medications administered during this visit.  SURGICAL HISTORY: Past Surgical History:  Procedure Laterality Date  . ABDOMINAL HYSTERECTOMY  1979  . ABDOMINAL HYSTERECTOMY  1978  . APPENDECTOMY    . APPLICATION OF WOUND VAC  wound vac  . APPLICATION OF WOUND VAC  06/26/2012   Procedure: APPLICATION OF WOUND VAC;  Surgeon: Gayland Curry, MD,FACS;  Location: [facility];  Service: General;;  . BACK SURGERY    . COLON RESECTION  10/09/2011   Procedure: COLON RESECTION;  Surgeon: Gayland Curry, MD;  Location: [facility];  Service: General;  Laterality: N/A;  Colon Resection with colostomy  . COLON RESECTION  06/26/2012   Procedure: COLON RESECTION;  Surgeon: Gayland Curry, MD,FACS;  Location: [facility];  Service: General;  Laterality: N/A;  . ESOPHAGOGASTRODUODENOSCOPY (EGD) WITH PROPOFOL N/A 02/17/2015   Procedure: ESOPHAGOGASTRODUODENOSCOPY (EGD) WITH PROPOFOL;  Surgeon: Jerene Bears, MD;  Location: [facility];  Service: Gastroenterology;  Laterality: N/A;  . jackson pratt    . PROCTOSCOPY  06/26/2012   Procedure: PROCTOSCOPY;  Surgeon: Gayland Curry, MD,FACS;  Location: [facility];  Service: General;  Laterality: N/A;  Rigid Proctoscopy    SOCIAL HISTORY: Social History  Social History  . Marital status: Married    Spouse name: N/A  . Number of children: 0  . Years of education: N/A   Occupational History  . Retired    Social History Main Topics  . Smoking status: Former Smoker    Quit date: 09/24/2010  . Smokeless tobacco: Never Used  . Alcohol use No  . Drug use:  No  . Sexual activity: Not Currently    Birth control/ protection: Surgical   Other Topics Concern  . Not on file   Social History Narrative  . No narrative on file  No children   FAMILY HISTORY: Family History  Problem Relation Age of Onset  . Heart disease Father   . Diabetes Father   . Hypertension Father   . Heart disease Brother   . Diabetes Brother   . Diabetes Mother   . Hypertension Mother   . Diabetes Sister   . Colon cancer Neg Hx   . Colon polyps Neg Hx   . Esophageal cancer Neg Hx   . Kidney disease Neg Hx   . Gallbladder disease Neg Hx     Review of Systems  Constitutional: Negative.   Dry mouth persist but not getting worse Eyes:       Wears glasses  Respiratory: Negative.        SOB with exertion  Cardiovascular: Negative.   Gastrointestinal: Negative.   Genitourinary: Negative.   Musculoskeletal: Positive for joint pain and hip pain.      Bad right knee Uses walker at home  Skin: Negative.   Neurological: Negative.   Endo/Heme/Allergies: Negative.   Psychiatric/Behavioral: Negative.   14 point review of systems was performed and is negative except as detailed under history of present illness and above   PHYSICAL EXAMINATION ECOG PERFORMANCE STATUS: 1 - Symptomatic but completely ambulatory  There were no vitals filed for this visit. Physical Exam  Constitutional: She is oriented to person, place, and time and well-developed, well-nourished, and in no distress. In wheelchair HENT:  Head: Normocephalic and atraumatic.  Nose: Nose normal.  Mouth/Throat: Oropharynx is clear and moist. No oropharyngeal exudate.  Eyes: Conjunctivae and EOM are normal. Pupils are equal, round, and reactive to light. Right eye exhibits no discharge. Left eye exhibits no discharge. No scleral icterus.  Neck: Normal range of motion. Neck supple. No tracheal deviation present. No thyromegaly present.  Cardiovascular: Normal rate, regular rhythm and normal heart  sounds.  Exam reveals no gallop and no friction rub.   No murmur heard. Pulmonary/Chest: Effort normal and breath sounds normal. She has no wheezes. She has no rales.  Abdominal: Soft. Bowel sounds are normal. She exhibits no distension and no mass. There is no tenderness. There is no rebound and no guarding. EXAM performed with patient in the upright position Musculoskeletal: Normal range of motion. She exhibits no edema.   Lymphadenopathy:    She has no cervical adenopathy.  Neurological: She is alert and oriented to person, place, and time.No cranial nerve deficit.  Skin: Skin is warm and dry. No rash noted.  Psychiatric: Mood, memory, affect and judgment normal.  Nursing note and vitals reviewed.   LABORATORY DATA: I have reviewed the data as listed.   CBC    Component Value Date/Time   WBC 12.4 (H) 07/05/2016 1250   RBC 5.55 (H) 07/05/2016 1250   HGB 14.2 07/05/2016 1250   HCT 45.6 07/05/2016 1250   PLT 373 07/05/2016 1250   MCV 82.2 07/05/2016 1250  MCH 25.6 (L) 07/05/2016 1250   MCHC 31.1 07/05/2016 1250   RDW 15.4 07/05/2016 1250   LYMPHSABS 2.6 07/05/2016 1250   MONOABS 0.7 07/05/2016 1250   EOSABS 0.3 07/05/2016 1250   BASOSABS 0.0 07/05/2016 1250   CMP     Component Value Date/Time   NA 139 01/21/2015 1430   K 3.4 (L) 01/21/2015 1430   CL 98 01/21/2015 1430   CO2 30 01/21/2015 1430   GLUCOSE 149 (H) 01/21/2015 1430   BUN 20 01/21/2015 1430   CREATININE 0.83 01/21/2015 1430   CALCIUM 9.1 01/21/2015 1430   PROT 7.2 01/21/2015 1430   ALBUMIN 3.5 01/21/2015 1430   AST 16 01/21/2015 1430   ALT 18 01/21/2015 1430   ALKPHOS 79 01/21/2015 1430   BILITOT 0.8 01/21/2015 1430   GFRNONAA 67 (L) 01/21/2015 1430   GFRAA 78 (L) 01/21/2015 1430   Results for DALEXA, CLOOS (MRN [id number])   Ref. Range 06/02/2015 12:25 07/14/2015 12:29 08/25/2015 13:26 11/17/2015 12:13 12/22/2015 12:30 02/02/2016 14:18 03/23/2016 12:51  Ferritin Latest Ref Range: 11 - 307 ng/mL 29 19 23 14  14 12 18     Pathology: I have personally reviewed the radiological images as listed and agreed with the findings in the report.       ASSESSMENT and THERAPY PLAN:  Iron deficiency anemia due to chronic blood loss Ferritin and iron iron-binding capacity is pending. Hemoglobin is 14 g.   leucocytosis persist Colonoscopy 09/07/2009 with severe diverticulosis in the sigmoid to descending, sessile polyp EGD 02/17/2015 with gastritis, obtained biopsies all negative Leukocytosis, negative flow cytometry and BCR-ABL  Because of stability of hemoglobin and patient would return in 6 months for follow-up.  Patient was advised to call us in between if has seen any stool or urine or felt weak.  She is morbidly obese with multiple other medical issues being taken care by primary care physician  We will continue with ongoing observation and replace iron as needed. She will return for labs only in 6 weeks. She will return in 6 months for additional blood work and follow-up.     Forest Gleason, MD  07/05/2016

## 2016-07-10 ENCOUNTER — Other Ambulatory Visit (HOSPITAL_COMMUNITY): Payer: Self-pay | Admitting: Hematology & Oncology

## 2016-07-12 ENCOUNTER — Ambulatory Visit (INDEPENDENT_AMBULATORY_CARE_PROVIDER_SITE_OTHER): Payer: Medicare Other | Admitting: Podiatry

## 2016-07-12 ENCOUNTER — Encounter: Payer: Self-pay | Admitting: Podiatry

## 2016-07-12 VITALS — BP 142/73 | HR 76 | Resp 16

## 2016-07-12 DIAGNOSIS — M79676 Pain in unspecified toe(s): Secondary | ICD-10-CM

## 2016-07-12 DIAGNOSIS — B351 Tinea unguium: Secondary | ICD-10-CM

## 2016-07-12 DIAGNOSIS — E1140 Type 2 diabetes mellitus with diabetic neuropathy, unspecified: Secondary | ICD-10-CM | POA: Diagnosis not present

## 2016-07-12 NOTE — Progress Notes (Signed)
Patient ID: BRYLI CEASAR, female   DOB: 1941/03/22, 75 y.o.   MRN: [id number] Complaint:  Visit Type: Patient returns to my office for continued preventative foot care services. Complaint: Patient states" my nails have grown long and thick and become painful to walk and wear shoes" Patient has been diagnosed with DM with no foot complications. The patient presents for preventative foot care services. No changes to ROS  Podiatric Exam: Vascular: dorsalis pedis and posterior tibial pulses are palpable bilateral. Capillary return is immediate. Temperature gradient is WNL. Skin turgor WNL  Sensorium: Diminished  Semmes Weinstein monofilament test. Normal tactile sensation bilaterally. Nail Exam: Pt has thick disfigured discolored nails with subungual debris noted bilateral entire nail hallux through fifth toenails Ulcer Exam: There is no evidence of ulcer or pre-ulcerative changes or infection. Orthopedic Exam: Muscle tone and strength are WNL. No limitations in general ROM. No crepitus or effusions noted. Foot type and digits show no abnormalities. Bony prominences are unremarkable. Skin: No Porokeratosis. No infection or ulcers  Diagnosis:  Onychomycosis, , Pain in right toe, pain in left toes  Treatment & Plan Procedures and Treatment: Consent by patient was obtained for treatment procedures. The patient understood the discussion of treatment and procedures well. All questions were answered thoroughly reviewed. Debridement of mycotic and hypertrophic toenails, 1 through 5 bilateral and clearing of subungual debris. No ulceration, no infection noted.  Return Visit-Office Procedure: Patient instructed to return to the office for a follow up visit 3 months for continued evaluation and treatment.    Gardiner Barefoot DPM

## 2016-07-17 ENCOUNTER — Encounter (HOSPITAL_BASED_OUTPATIENT_CLINIC_OR_DEPARTMENT_OTHER): Payer: Medicare Other

## 2016-07-17 VITALS — BP 161/69 | HR 70 | Temp 97.5°F | Resp 16

## 2016-07-17 DIAGNOSIS — D500 Iron deficiency anemia secondary to blood loss (chronic): Secondary | ICD-10-CM

## 2016-07-17 MED ORDER — SODIUM CHLORIDE 0.9 % IV SOLN
510.0000 mg | Freq: Once | INTRAVENOUS | Status: AC
  Administered 2016-07-17: 510 mg via INTRAVENOUS
  Filled 2016-07-17: qty 17

## 2016-07-17 MED ORDER — SODIUM CHLORIDE 0.9 % IV SOLN
INTRAVENOUS | Status: DC
  Administered 2016-07-17: 14:00:00 via INTRAVENOUS

## 2016-07-17 NOTE — Patient Instructions (Signed)
[facility] at [facility] Discharge Instructions  RECOMMENDATIONS MADE BY THE CONSULTANT AND ANY TEST RESULTS WILL BE SENT TO YOUR REFERRING PHYSICIAN.  Received Feraheme today. Follow-up as scheduled. Call clinic for any questions or concerns  Thank you for choosing [facility] at [facility] to provide your oncology and hematology care.  To afford each patient quality time with our provider, please arrive at least 15 minutes before your scheduled appointment time.   Beginning January 23rd 2017 lab work for the Cancer Center will be done in the  Main lab at [facility] on 1st floor. If you have a lab appointment with the Cancer Center please come in thru the  Main Entrance and check in at the main information desk  You need to re-schedule your appointment should you arrive 10 or more minutes late.  We strive to give you quality time with our providers, and arriving late affects you and other patients whose appointments are after yours.  Also, if you no show three or more times for appointments you may be dismissed from the clinic at the providers discretion.     Again, thank you for choosing [facility].  Our hope is that these requests will decrease the amount of time that you wait before being seen by our physicians.       _____________________________________________________________  Should you have questions after your visit to [facility], please contact our office at [phone] between the hours of 8:30 a.m. and 4:30 p.m.  Voicemails left after 4:30 p.m. will not be returned until the following business day.  For prescription refill requests, have your pharmacy contact our office.         Resources For Cancer Patients and their Caregivers ? American Cancer Society: Can assist with transportation, wigs, general needs, runs Look Good Feel Better.        1-888-227-6333 ? Cancer Care: Provides financial  assistance, online support groups, medication/co-pay assistance.  1-800-813-HOPE (4673) ? [facility] Assists [place] cancer patients and their families through emotional , educational and financial support.  336-427-4357 ? [place] DSS Where to apply for food stamps, Medicaid and utility assistance. 336-342-1394 ? RC[facility]: Transportation to medical appointments. 336-347-2287 ? Social Security Administration: May apply for disability if have a Stage IV cancer. 336-342-7796 1-800-772-1213 ? [place] Aging, Disability and Transit Services: Assists with nutrition, care and transit needs. 336-349-2343  Cancer Center Support Programs: @10RELATIVEDAYS@ > Cancer Support Group  2nd Tuesday of the month 1pm-2pm, Journey Room  > Creative Journey  3rd Tuesday of the month 1130am-1pm, Journey Room  > Look Good Feel Better  1st Wednesday of the month 10am-12 noon, Journey Room (Call American Cancer Society to register 1-800-395-5775)   

## 2016-07-17 NOTE — Progress Notes (Signed)
Inda Merlin Pe tolerated Feraheme well without complaints or incident. VSS upon discharge. Pt discharged via wheelchair in satisfactory condition with caregiver

## 2016-07-23 ENCOUNTER — Encounter (HOSPITAL_BASED_OUTPATIENT_CLINIC_OR_DEPARTMENT_OTHER): Payer: Medicare Other

## 2016-07-23 VITALS — BP 138/76 | HR 74 | Temp 98.0°F | Resp 20

## 2016-07-23 DIAGNOSIS — D500 Iron deficiency anemia secondary to blood loss (chronic): Secondary | ICD-10-CM | POA: Diagnosis not present

## 2016-07-23 MED ORDER — SODIUM CHLORIDE 0.9 % IV SOLN
510.0000 mg | Freq: Once | INTRAVENOUS | Status: AC
  Administered 2016-07-23: 510 mg via INTRAVENOUS
  Filled 2016-07-23: qty 17

## 2016-07-23 MED ORDER — SODIUM CHLORIDE 0.9 % IV SOLN
INTRAVENOUS | Status: DC
  Administered 2016-07-23: 14:00:00 via INTRAVENOUS

## 2016-07-23 NOTE — Addendum Note (Signed)
Addended by: Anselmo Rod on: 07/23/2016 03:58 PM   Modules accepted: Orders

## 2016-07-23 NOTE — Patient Instructions (Signed)
Cancer Center at [facility] Discharge Instructions  RECOMMENDATIONS MADE BY THE CONSULTANT AND ANY TEST RESULTS WILL BE SENT TO YOUR REFERRING PHYSICIAN.  Received Feraheme today. Follow-up as scheduled. Call clinic for any questions or concerns  Thank you for choosing  Cancer Center at [facility] to provide your oncology and hematology care.  To afford each patient quality time with our provider, please arrive at least 15 minutes before your scheduled appointment time.   Beginning January 23rd 2017 lab work for the Cancer Center will be done in the  Main lab at [facility] on 1st floor. If you have a lab appointment with the Cancer Center please come in thru the  Main Entrance and check in at the main information desk  You need to re-schedule your appointment should you arrive 10 or more minutes late.  We strive to give you quality time with our providers, and arriving late affects you and other patients whose appointments are after yours.  Also, if you no show three or more times for appointments you may be dismissed from the clinic at the providers discretion.     Again, thank you for choosing [facility].  Our hope is that these requests will decrease the amount of time that you wait before being seen by our physicians.       _____________________________________________________________  Should you have questions after your visit to [facility], please contact our office at [phone] between the hours of 8:30 a.m. and 4:30 p.m.  Voicemails left after 4:30 p.m. will not be returned until the following business day.  For prescription refill requests, have your pharmacy contact our office.         Resources For Cancer Patients and their Caregivers ? American Cancer Society: Can assist with transportation, wigs, general needs, runs Look Good Feel Better.        1-888-227-6333 ? Cancer Care: Provides financial  assistance, online support groups, medication/co-pay assistance.  1-800-813-HOPE (4673) ? [facility] Assists [place] cancer patients and their families through emotional , educational and financial support.  336-427-4357 ? [place] DSS Where to apply for food stamps, Medicaid and utility assistance. 336-342-1394 ? RCATS: Transportation to medical appointments. 336-347-2287 ? Social Security Administration: May apply for disability if have a Stage IV cancer. 336-342-7796 1-800-772-1213 ? [place] Aging, Disability and Transit Services: Assists with nutrition, care and transit needs. 336-349-2343  Cancer Center Support Programs: @10RELATIVEDAYS@ > Cancer Support Group  2nd Tuesday of the month 1pm-2pm, Journey Room  > Creative Journey  3rd Tuesday of the month 1130am-1pm, Journey Room  > Look Good Feel Better  1st Wednesday of the month 10am-12 noon, Journey Room (Call American Cancer Society to register 1-800-395-5775)   

## 2016-07-23 NOTE — Progress Notes (Signed)
[name] tolerated Feraheme well without complaints or incident. Pt declined Influenza vaccine due to receiving it last month at her PCP.VSS upon discharge. Pt discharged via wheelchair in satisfactory condition with caregiver

## 2016-10-11 ENCOUNTER — Ambulatory Visit: Payer: Medicare Other | Admitting: Podiatry

## 2016-12-25 ENCOUNTER — Ambulatory Visit: Payer: Medicare Other | Admitting: Podiatry

## 2017-01-03 ENCOUNTER — Encounter (HOSPITAL_COMMUNITY): Payer: Medicare Other | Attending: Adult Health | Admitting: Adult Health

## 2017-01-03 ENCOUNTER — Encounter (HOSPITAL_COMMUNITY): Payer: Medicare Other

## 2017-01-03 ENCOUNTER — Encounter (HOSPITAL_COMMUNITY): Payer: Self-pay | Admitting: Adult Health

## 2017-01-03 VITALS — BP 129/49 | HR 62 | Temp 98.3°F | Resp 16 | Wt 315.0 lb

## 2017-01-03 DIAGNOSIS — D509 Iron deficiency anemia, unspecified: Secondary | ICD-10-CM | POA: Diagnosis not present

## 2017-01-03 DIAGNOSIS — D500 Iron deficiency anemia secondary to blood loss (chronic): Secondary | ICD-10-CM | POA: Diagnosis present

## 2017-01-03 DIAGNOSIS — D72829 Elevated white blood cell count, unspecified: Secondary | ICD-10-CM

## 2017-01-03 DIAGNOSIS — R0609 Other forms of dyspnea: Secondary | ICD-10-CM

## 2017-01-03 LAB — CBC
HCT: 38.3 % (ref 36.0–46.0)
Hemoglobin: 10.9 g/dL — ABNORMAL LOW (ref 12.0–15.0)
MCH: 21 pg — ABNORMAL LOW (ref 26.0–34.0)
MCHC: 28.5 g/dL — AB (ref 30.0–36.0)
MCV: 73.8 fL — ABNORMAL LOW (ref 78.0–100.0)
Platelets: 392 10*3/uL (ref 150–400)
RBC: 5.19 MIL/uL — ABNORMAL HIGH (ref 3.87–5.11)
RDW: 17.2 % — ABNORMAL HIGH (ref 11.5–15.5)
WBC: 15.2 10*3/uL — AB (ref 4.0–10.5)

## 2017-01-03 LAB — IRON AND TIBC
Iron: 14 ug/dL — ABNORMAL LOW (ref 28–170)
Saturation Ratios: 3 % — ABNORMAL LOW (ref 10.4–31.8)
TIBC: 505 ug/dL — ABNORMAL HIGH (ref 250–450)
UIBC: 491 ug/dL

## 2017-01-03 LAB — FERRITIN: Ferritin: 6 ng/mL — ABNORMAL LOW (ref 11–307)

## 2017-01-03 NOTE — Progress Notes (Signed)
[place], [place] [address]   CLINIC:  Medical Oncology/Hematology  PCP:  [name], [place] [address] [phone]   REASON FOR VISIT:  Follow-up for Iron deficiency anemia due to chronic blood loss  CURRENT THERAPY: IV iron prn     HISTORY OF PRESENT ILLNESS:  (From Dr. Metro Kung last note on 07/05/16)     INTERVAL HISTORY:  [name] "[name]" 76 y.o. female presents with her caregiver, Glenard Haring, for continued follow-up for iron deficiency anemia.   Overall, she tells me she has felt relatively well/at her baseline, with the exception of worsening shortness of breath with exertion; she has noticed worsening symptoms in the past 2-3 weeks.  She does not have home O2, but does use O2 with her CPAP at bedtime.  She is requesting a prescription for home O2; she does have an upcoming appt with her PCP, Dr. Hilma Favors, in about 2 weeks "but I just wanted to see if you all could give the prescription to me today."    She has some fatigue. Denies any blood in her stool/dark tarry stools. Denies any nosebleeds, hematuria, or vaginal bleeding.  Denies any fever/chills or recent infection.  Her appetite is "too good."  She has not seen GI specialist since 2016.    IV Iron Administration Record:  Oncology Flowsheet 04/12/2014 04/19/2014  ferric gluconate (NULECIT) IV -- --  ferumoxytol [facility]) IV 510 mg 510 mg    Oncology Flowsheet 10/27/2014  ferric gluconate (NULECIT) IV --  ferumoxytol [facility]) IV 510 mg   Oncology Flowsheet 11/03/2014 01/31/2015 04/28/2015 05/09/2015  ferric gluconate (NULECIT) IV -- 250 mg -- --  ferumoxytol [facility]) IV 510 mg -- 510 mg 510 mg   Oncology Flowsheet 06/09/2015 07/25/2015 09/15/2015 02/09/2016  ferumoxytol (FERAHEME) IV 510 mg 510 mg 510 mg 510 mg   Oncology Flowsheet 04/13/2016 07/17/2016 07/23/2016  ferumoxytol (FERAHEME) IV 510 mg 510 mg 510 mg     REVIEW OF  SYSTEMS:  Review of Systems  Constitutional: Positive for fatigue. Negative for appetite change, chills and fever.  HENT:   Positive for mouth sores (chronic right tongue sores).   Eyes: Negative.   Respiratory: Positive for shortness of breath (dyspnea on exertion). Negative for cough and hemoptysis.   Cardiovascular: Positive for leg swelling. Negative for chest pain.  Gastrointestinal: Negative.  Negative for abdominal pain, blood in stool, constipation, diarrhea, nausea and vomiting.  Endocrine: Negative.   Genitourinary: Negative.  Negative for dysuria, hematuria and vaginal bleeding.   Skin: Negative.  Negative for rash.  Neurological: Positive for dizziness (occasional ). Negative for headaches.  Hematological: Negative.   Psychiatric/Behavioral: Negative.      PAST MEDICAL/SURGICAL HISTORY:  Past Medical History:  Diagnosis Date  . Anemia   . Arthritis   . Bronchitis 05/2014  . Cellulitis of lower extremity 06/2014   bilateral  . COPD (chronic obstructive pulmonary disease) ([place])   . Diabetes mellitus   . Diverticulitis   . Gallstone   . Hypertension   . MRSA (methicillin resistant Staphylococcus aureus)    has now "tested negative"- no open wounds as of 02-10-15  . Obesity   . Shortness of breath    with exertion   . Sleep apnea    cpap at 10   . Sleep apnea   . Thyroid disease   . Tubular adenoma of colon    Past Surgical History:  Procedure Laterality Date  .  ABDOMINAL HYSTERECTOMY  1979  . ABDOMINAL HYSTERECTOMY  1978  . APPENDECTOMY    . APPLICATION OF WOUND VAC  wound vac  . APPLICATION OF WOUND VAC  06/26/2012   Procedure: APPLICATION OF WOUND VAC;  Surgeon: Gayland Curry, MD,FACS;  Location: [facility];  Service: General;;  . BACK SURGERY    . COLON RESECTION  10/09/2011   Procedure: COLON RESECTION;  Surgeon: Gayland Curry, MD;  Location: [facility];  Service: General;  Laterality: N/A;  Colon Resection with colostomy  . COLON RESECTION  06/26/2012   Procedure:  COLON RESECTION;  Surgeon: Gayland Curry, MD,FACS;  Location: [facility];  Service: General;  Laterality: N/A;  . ESOPHAGOGASTRODUODENOSCOPY (EGD) WITH PROPOFOL N/A 02/17/2015   Procedure: ESOPHAGOGASTRODUODENOSCOPY (EGD) WITH PROPOFOL;  Surgeon: Jerene Bears, MD;  Location: [facility];  Service: Gastroenterology;  Laterality: N/A;  . jackson pratt    . PROCTOSCOPY  06/26/2012   Procedure: PROCTOSCOPY;  Surgeon: Gayland Curry, MD,FACS;  Location: [facility];  Service: General;  Laterality: N/A;  Rigid Proctoscopy     SOCIAL HISTORY:  Social History   Social History  . Marital status: Married    Spouse name: N/A  . Number of children: 0  . Years of education: N/A   Occupational History  . Retired    Social History Main Topics  . Smoking status: Former Smoker    Quit date: 09/24/2010  . Smokeless tobacco: Never Used  . Alcohol use No  . Drug use: No  . Sexual activity: Not Currently    Birth control/ protection: Surgical   Other Topics Concern  . Not on file   Social History Narrative  . No narrative on file    FAMILY HISTORY:  Family History  Problem Relation Age of Onset  . Heart disease Father   . Diabetes Father   . Hypertension Father   . Diabetes Mother   . Hypertension Mother   . Heart disease Brother   . Diabetes Brother   . Diabetes Sister   . Colon cancer Neg Hx   . Colon polyps Neg Hx   . Esophageal cancer Neg Hx   . Kidney disease Neg Hx   . Gallbladder disease Neg Hx     CURRENT MEDICATIONS:  Outpatient Encounter Prescriptions as of 01/03/2017  Medication Sig Note  . aspirin 81 MG tablet Take 162 mg by mouth daily. Take 2 tablets, by mouth, every day   . budesonide-formoterol (SYMBICORT) 160-4.5 MCG/ACT inhaler Inhale 2 puffs into the lungs 2 (two) times daily.   . Calcium Carb-Cholecalciferol (CALCIUM 600 + D PO) Take 2 tablets by mouth daily.   . furosemide (LASIX) 40 MG tablet Take 40 mg by mouth daily.  12/27/2014: Received from: External Pharmacy  .  Glucosamine-Chondroitin-Vit D3 1500-1200-800 MG-MG-UNIT PACK Take 2 tablets by mouth daily.   Marland Kitchen JANUVIA 100 MG tablet Take 100 mg by mouth daily.  12/27/2014: Received from: External Pharmacy  . levothyroxine (SYNTHROID, LEVOTHROID) 50 MCG tablet Take 50 mcg by mouth daily. 08/25/2015: Received from: External Pharmacy Received Sig:   . meloxicam (MOBIC) 7.5 MG tablet Take 7.5 mg by mouth daily.  05/09/2015: Pt is taking this medication again  . metFORMIN (GLUCOPHAGE) 500 MG tablet Take 1,000 mg by mouth 2 (two) times daily with a meal.    . montelukast (SINGULAIR) 10 MG tablet Take 10 mg by mouth daily.    Marland Kitchen nystatin (MYCOSTATIN) 100000 UNIT/ML suspension Take by mouth. Swish and spit  08/25/2015: Received from: External Pharmacy Received Sig:   . nystatin (MYCOSTATIN) powder Apply topically as needed. 08/25/2015: Received from: External Pharmacy Received Sig:   . nystatin cream (MYCOSTATIN) Apply topically as needed. 08/25/2015: Received from: External Pharmacy Received Sig:   . polyethylene glycol (MIRALAX / GLYCOLAX) packet Take 17 g by mouth daily.   . traMADol (ULTRAM) 50 MG tablet Take 50 mg by mouth at bedtime as needed for moderate pain.    . [DISCONTINUED] Wheat Dextrin (BENEFIBER DRINK MIX PO) Take by mouth.    Facility-Administered Encounter Medications as of 01/03/2017  Medication  . 0.9 %  sodium chloride infusion    ALLERGIES:  Allergies  Allergen Reactions  . Adhesive [Tape] Hives and Rash  . Codeine Itching and Other (See Comments)  . Vancomycin Rash  . Zosyn [Piperacillin Sod-Tazobactam So] Rash     PHYSICAL EXAM:  ECOG Performance status: 2-3 - Symptomatic; requires assistance >50% of time.   Vitals:   01/03/17 1309  BP: (!) 129/49  Pulse: 62  Resp: 16  Temp: 98.3 F (36.8 C)   Filed Weights   01/03/17 1309  Weight: (!) 315 lb (142.9 kg)    Physical Exam  Constitutional: She is oriented to person, place, and time and well-developed, well-nourished, and in no  distress.  Seen seated in wheelchair   HENT:  Head: Normocephalic.  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   Eyes: Conjunctivae are normal. Pupils are equal, round, and reactive to light. No scleral icterus.  Neck: Normal range of motion. Neck supple.  Cardiovascular: Normal rate, regular rhythm and normal heart sounds.   Pulmonary/Chest: Effort normal. No respiratory distress. She has wheezes (Mild expiratory wheezes to bilat bases). She has no rales.  Abdominal: Soft. Bowel sounds are normal. There is no tenderness. There is no rebound and no guarding.  Musculoskeletal: Normal range of motion. She exhibits edema (1-2+ BLE, symmetric ).  Lymphadenopathy:    She has no cervical adenopathy.       Right: No supraclavicular adenopathy present.       Left: No supraclavicular adenopathy present.  Neurological: She is alert and oriented to person, place, and time. No cranial nerve deficit. Gait normal.  Skin: Skin is warm and dry. No rash noted. There is pallor.  Psychiatric: Mood, memory, affect and judgment normal.  Nursing note and vitals reviewed.    LABORATORY DATA:  I have reviewed the labs as listed.  CBC    Component Value Date/Time   WBC 15.2 (H) 01/03/2017 1215   RBC 5.19 (H) 01/03/2017 1215   HGB 10.9 (L) 01/03/2017 1215   HCT 38.3 01/03/2017 1215   PLT 392 01/03/2017 1215   MCV 73.8 (L) 01/03/2017 1215   MCH 21.0 (L) 01/03/2017 1215   MCHC 28.5 (L) 01/03/2017 1215   RDW 17.2 (H) 01/03/2017 1215   LYMPHSABS 2.6 07/05/2016 1250   MONOABS 0.7 07/05/2016 1250   EOSABS 0.3 07/05/2016 1250   BASOSABS 0.0 07/05/2016 1250   CMP Latest Ref Rng & Units 01/21/2015 06/25/2014 06/08/2014  Glucose 70 - 99 mg/dL 149(H) 236(H) 285(H)  BUN 6 - 23 mg/dL 20 16 39(H)  Creatinine 0.50 - 1.10 mg/dL 0.83 0.89 0.90  Sodium 135 - 145 mmol/L 139 140 140  Potassium 3.5 - 5.1 mmol/L 3.4(L) 4.6 5.8(H)  Chloride 96 - 112 mmol/L 98  98 102  CO2 19 - 32 mmol/L 30 28 29   Calcium 8.4 - 10.5  mg/dL 9.1 8.8 8.4  Total Protein 6.0 - 8.3 g/dL 7.2 6.9 -  Total Bilirubin 0.3 - 1.2 mg/dL 0.8 0.5 -  Alkaline Phos 39 - 117 U/L 79 94 -  AST 0 - 37 U/L 16 22 -  ALT 0 - 35 U/L 18 57(H) -   Results for DESANI, SPRUNG (MRN [id number])   Ref. Range 02/02/2016 14:18 03/23/2016 12:51 07/05/2016 12:50  Ferritin Latest Ref Range: 11 - 307 ng/mL 12 18 9  (L)      PENDING LABS:  Ferritin pending   DIAGNOSTIC IMAGING:    PATHOLOGY:  Last EGD: 02/17/15     ASSESSMENT & PLAN:   Iron deficiency anemia:  -Anemia thought to be in setting of chronic GI blood loss. Last IV iron infusion 07/23/16.  -Hemoglobin decreased today at 10.9 g/dL. Iron studies pending.  -Last EGD with GI (Dr. Zenovia Jarred) was done on 02/17/15; noted to have gastritis and several biopsies were taken, all with benign path.  At that time, Dr. Hilarie Fredrickson recommended video capsule endoscopy if anemia progresses/persists.  -Discussed with patient our recommendation to refer back to see Dr. Hilarie Fredrickson given continued anemia and IV iron infusion requirements.  -We discussed that she will likely need IV iron infusion set-up for early next week given decreased hemoglobin; ferritin, iron/TIBC pending for today. We will calculate her iron deficit and make arrangements for IV iron infusion, if appropriate. We will call her with her lab results when they are available.  -Labs every 2 months (CBC with diff, ferritin, iron/TIBC).  -Return to cancer center in 6 months with labs (CBC with diff, ferritin, iron/TIBC).   Leukocytosis/Neutrophilia:  -Previous evaluation with flow cytometry in 02/2014 negative; BCR-ABL negative. Unlikely malignant/myeloproliferative etiology.  -No recent fever, illness/infection, or steroids per patient. She currently does not smoke, which can cause neutrophilia.  -We will continue to monitor.   Shortness of breath:  -Per patient and her caregiver, shortness of  breath with exertion has worsened in past 2-3 weeks.  No cough, tachycardia, or chest pain. No shortness of breath at rest.  Mild bilateral lower extremity edema, which is symmetric; no calf pain or erythema. Vital signs are stable and she is hemodynamically stable.  Pulmonary Embolism Wells score 1.5, indicating low probability for PE.  I have higher clinical suspicion for worsening COPD/chronic lung disease.  -She has short-interval follow-up with her PCP in 2 weeks. She requests home O2; recommended she talk with her PCP, as they will see her more often for follow-up visits and can manage her concerns, as needed.     Dispo:  -Labs every 2 months (CBC with diff, ferritin, iron/TIBC).  -Return to cancer center in 6 months with labs (CBC with diff, ferritin, iron/TIBC).     All questions were answered to patient's stated satisfaction. Encouraged patient to call with any new concerns or questions before her next visit to the cancer center and we can certain see her sooner, if needed.    Plan of care discussed with Dr. Talbert Cage, who agrees with the above aforementioned.     Orders placed this encounter:  Orders Placed This Encounter  Procedures  . CBC with Differential/Platelet  . Ferritin  . Iron and TIBC      Mike Craze, NP [place] [phone]

## 2017-01-03 NOTE — Patient Instructions (Addendum)
[place] at [facility] Discharge Instructions  RECOMMENDATIONS MADE BY THE CONSULTANT AND ANY TEST RESULTS WILL BE SENT TO YOUR REFERRING PHYSICIAN.  You were seen today by Mike Craze NP. Follow up with Primary care for home O2. Labs every 2 months. Return in 6 months for labs and follow up.    Thank you for choosing [place] at [facility] to provide your oncology and hematology care.  To afford each patient quality time with our provider, please arrive at least 15 minutes before your scheduled appointment time.    If you have a lab appointment with the [facility] please come in thru the  Main Entrance and check in at the main information desk  You need to re-schedule your appointment should you arrive 10 or more minutes late.  We strive to give you quality time with our providers, and arriving late affects you and other patients whose appointments are after yours.  Also, if you no show three or more times for appointments you may be dismissed from the clinic at the providers discretion.     Again, thank you for choosing [facility]  Our hope is that these requests will decrease the amount of time that you wait before being seen by our physicians.       _____________________________________________________________  Should you have questions after your visit to [facility], please contact our office at [phone] between the hours of 8:30 a.m. and 4:30 p.m.  Voicemails left after 4:30 p.m. will not be returned until the following business day.  For prescription refill requests, have your pharmacy contact our office.       Resources For Cancer Patients and their Caregivers ? American Cancer Society: Can assist with transportation, wigs, general needs, runs Look Good Feel Better.        503-002-5876 ? Cancer Care: Provides financial assistance, online support groups, medication/co-pay assistance.   1-800-813-HOPE 779 804 2988) ? [place] Assists [place] cancer patients and their families through emotional , educational and financial support.  (508) 085-1580 ? [place] DSS Where to apply for food stamps, Medicaid and utility assistance. (458)307-4514 ? RCATS: Transportation to medical appointments. (978)236-5914 ? Social Security Administration: May apply for disability if have a Stage IV cancer. 3302290318 (586) 585-2930 ? [employer], Disability and Transit Services: Assists with nutrition, care and transit needs. [place] Support Programs: @10RELATIVEDAYS @ > Cancer Support Group  2nd Tuesday of the month 1pm-2pm, Journey Room  > Creative Journey  3rd Tuesday of the month 1130am-1pm, Journey Room  > Look Good Feel Better  1st Wednesday of the month 10am-12 noon, Journey Room (Call [place] to register [phone])

## 2017-01-16 ENCOUNTER — Telehealth (HOSPITAL_COMMUNITY): Payer: Self-pay | Admitting: *Deleted

## 2017-01-16 NOTE — Telephone Encounter (Signed)
All her lab values are low, her ferritin is 6, so I'm thinking she needs something.

## 2017-01-17 ENCOUNTER — Other Ambulatory Visit (HOSPITAL_COMMUNITY): Payer: Self-pay | Admitting: Oncology

## 2017-01-17 DIAGNOSIS — D508 Other iron deficiency anemias: Secondary | ICD-10-CM

## 2017-01-17 NOTE — Telephone Encounter (Signed)
-----   Message from [name], NP sent at 01/16/2017 10:35 PM EDT ----- Regarding: Labs This was a patient seen by Dr. [name] on 01/03/17, so we had no way to know to review her labs.  Ferritin has been very low since 01/03/17.   Will get her set up for IV iron ASAP.   Tom, what have we done in the past for results from when covering MDs are here and they don't check/manage their inbasket messages?  [name], I'll get orders put in for [name].   gwd

## 2017-01-17 NOTE — Telephone Encounter (Signed)
I called patient to let her know that her lab values were low and she did need IV iron. She was upset that no one had called her before now and stated that "someone needs to be severely reprimanded." I again apologized that this happened but we would get her set up for IV iron as soon as possible but it may be early next week. Patient stated that she "strongly suggests" we get her in for Iron this week. Message sent to scheduling, providers, director and AD.

## 2017-01-17 NOTE — Progress Notes (Signed)
Her you go [name].

## 2017-01-18 ENCOUNTER — Encounter (HOSPITAL_BASED_OUTPATIENT_CLINIC_OR_DEPARTMENT_OTHER): Payer: Medicare Other

## 2017-01-18 VITALS — BP 136/51 | HR 80 | Temp 97.9°F | Resp 18

## 2017-01-18 DIAGNOSIS — D509 Iron deficiency anemia, unspecified: Secondary | ICD-10-CM | POA: Diagnosis not present

## 2017-01-18 DIAGNOSIS — D500 Iron deficiency anemia secondary to blood loss (chronic): Secondary | ICD-10-CM

## 2017-01-18 DIAGNOSIS — D508 Other iron deficiency anemias: Secondary | ICD-10-CM

## 2017-01-18 MED ORDER — SODIUM CHLORIDE 0.9 % IV SOLN
510.0000 mg | Freq: Once | INTRAVENOUS | Status: AC
  Administered 2017-01-18: 510 mg via INTRAVENOUS
  Filled 2017-01-18: qty 17

## 2017-01-18 MED ORDER — SODIUM CHLORIDE 0.9 % IV SOLN
Freq: Once | INTRAVENOUS | Status: AC
  Administered 2017-01-18: 12:00:00 via INTRAVENOUS

## 2017-01-18 NOTE — Patient Instructions (Signed)
[place] at [facility] Discharge Instructions  RECOMMENDATIONS MADE BY THE CONSULTANT AND ANY TEST RESULTS WILL BE SENT TO YOUR REFERRING PHYSICIAN.  Feraheme 510 mg iron infusion given today as ordered.  Thank you for choosing [place] at [facility] to provide your oncology and hematology care.  To afford each patient quality time with our provider, please arrive at least 15 minutes before your scheduled appointment time.    If you have a lab appointment with the [facility] please come in thru the  Main Entrance and check in at the main information desk  You need to re-schedule your appointment should you arrive 10 or more minutes late.  We strive to give you quality time with our providers, and arriving late affects you and other patients whose appointments are after yours.  Also, if you no show three or more times for appointments you may be dismissed from the clinic at the providers discretion.     Again, thank you for choosing [facility].  Our hope is that these requests will decrease the amount of time that you wait before being seen by our physicians.       _____________________________________________________________  Should you have questions after your visit to [facility], please contact our office at [phone] between the hours of 8:30 a.m. and 4:30 p.m.  Voicemails left after 4:30 p.m. will not be returned until the following business day.  For prescription refill requests, have your pharmacy contact our office.       Resources For Cancer Patients and their Caregivers ? American Cancer Society: Can assist with transportation, wigs, general needs, runs Look Good Feel Better.        9052631910 ? Cancer Care: Provides financial assistance, online support groups, medication/co-pay assistance.  1-800-813-HOPE (970) 577-9442) ? [place] Assists [place] cancer patients  and their families through emotional , educational and financial support.  510-001-2324 ? [place] DSS Where to apply for food stamps, Medicaid and utility assistance. 660-346-6880 ? RCATS: Transportation to medical appointments. 9855470102 ? Social Security Administration: May apply for disability if have a Stage IV cancer. 660-174-7818 4098376200 ? [employer], Disability and Transit Services: Assists with nutrition, care and transit needs. [place] Support Programs: @10RELATIVEDAYS @ > Cancer Support Group  2nd Tuesday of the month 1pm-2pm, Journey Room  > Creative Journey  3rd Tuesday of the month 1130am-1pm, Journey Room  > Look Good Feel Better  1st Wednesday of the month 10am-12 noon, Journey Room (Call [place] to register [phone])

## 2017-01-18 NOTE — Progress Notes (Signed)
Tolerated iron infusion well. Stable on discharge home with aide via wheelchair.

## 2017-01-25 ENCOUNTER — Encounter (HOSPITAL_COMMUNITY): Payer: Self-pay

## 2017-01-25 ENCOUNTER — Ambulatory Visit (HOSPITAL_COMMUNITY): Payer: Medicare Other

## 2017-01-25 ENCOUNTER — Encounter (HOSPITAL_COMMUNITY): Payer: Medicare Other | Attending: Oncology

## 2017-01-25 VITALS — BP 124/56 | HR 67 | Temp 97.8°F | Resp 18

## 2017-01-25 DIAGNOSIS — D508 Other iron deficiency anemias: Secondary | ICD-10-CM

## 2017-01-25 DIAGNOSIS — D500 Iron deficiency anemia secondary to blood loss (chronic): Secondary | ICD-10-CM | POA: Insufficient documentation

## 2017-01-25 DIAGNOSIS — D72829 Elevated white blood cell count, unspecified: Secondary | ICD-10-CM | POA: Insufficient documentation

## 2017-01-25 DIAGNOSIS — D509 Iron deficiency anemia, unspecified: Secondary | ICD-10-CM | POA: Diagnosis not present

## 2017-01-25 MED ORDER — ACETAMINOPHEN 325 MG PO TABS
ORAL_TABLET | ORAL | Status: AC
  Filled 2017-01-25: qty 2

## 2017-01-25 MED ORDER — ACETAMINOPHEN 325 MG PO TABS
650.0000 mg | ORAL_TABLET | Freq: Once | ORAL | Status: AC
  Administered 2017-01-25: 650 mg via ORAL

## 2017-01-25 MED ORDER — SODIUM CHLORIDE 0.9 % IV SOLN
Freq: Once | INTRAVENOUS | Status: AC
  Administered 2017-01-25: 10:00:00 via INTRAVENOUS

## 2017-01-25 MED ORDER — FERUMOXYTOL INJECTION 510 MG/17 ML
510.0000 mg | Freq: Once | INTRAVENOUS | Status: AC
  Administered 2017-01-25: 510 mg via INTRAVENOUS
  Filled 2017-01-25: qty 17

## 2017-01-25 NOTE — Patient Instructions (Signed)
[facility] at [facility] Discharge Instructions  RECOMMENDATIONS MADE BY THE CONSULTANT AND ANY TEST RESULTS WILL BE SENT TO YOUR REFERRING PHYSICIAN.  Feraheme given today Follow up as scheduled.  Thank you for choosing [facility] at [facility] to provide your oncology and hematology care.  To afford each patient quality time with our provider, please arrive at least 15 minutes before your scheduled appointment time.    If you have a lab appointment with the Cancer Center please come in thru the  Main Entrance and check in at the main information desk  You need to re-schedule your appointment should you arrive 10 or more minutes late.  We strive to give you quality time with our providers, and arriving late affects you and other patients whose appointments are after yours.  Also, if you no show three or more times for appointments you may be dismissed from the clinic at the providers discretion.     Again, thank you for choosing [facility].  Our hope is that these requests will decrease the amount of time that you wait before being seen by our physicians.       _____________________________________________________________  Should you have questions after your visit to [facility], please contact our office at [phone] between the hours of 8:30 a.m. and 4:30 p.m.  Voicemails left after 4:30 p.m. will not be returned until the following business day.  For prescription refill requests, have your pharmacy contact our office.       Resources For Cancer Patients and their Caregivers ? American Cancer Society: Can assist with transportation, wigs, general needs, runs Look Good Feel Better.        1-888-227-6333 ? Cancer Care: Provides financial assistance, online support groups, medication/co-pay assistance.  1-800-813-HOPE (4673) ? [facility] Assists [place] cancer patients and  their families through emotional , educational and financial support.  336-427-4357 ? [place] DSS Where to apply for food stamps, Medicaid and utility assistance. 336-342-1394 ? RCATS: Transportation to medical appointments. 336-347-2287 ? Social Security Administration: May apply for disability if have a Stage IV cancer. 336-342-7796 1-800-772-1213 ? [place] Aging, Disability and Transit Services: Assists with nutrition, care and transit needs. 336-349-2343  Cancer Center Support Programs: @10RELATIVEDAYS@ > Cancer Support Group  2nd Tuesday of the month 1pm-2pm, Journey Room  > Creative Journey  3rd Tuesday of the month 1130am-1pm, Journey Room  > Look Good Feel Better  1st Wednesday of the month 10am-12 noon, Journey Room (Call [employer] to register 1-800-395-5775)   

## 2017-01-25 NOTE — Progress Notes (Signed)
feraheme given today per orders. Patient tolerated it well, no issues.Vitals stable and discharged home from clinic via wheelchair with caregiver. Follow up as scheduled.

## 2017-01-30 ENCOUNTER — Ambulatory Visit (INDEPENDENT_AMBULATORY_CARE_PROVIDER_SITE_OTHER): Payer: Medicare Other | Admitting: Podiatry

## 2017-01-30 ENCOUNTER — Encounter: Payer: Self-pay | Admitting: Podiatry

## 2017-01-30 DIAGNOSIS — M79676 Pain in unspecified toe(s): Secondary | ICD-10-CM

## 2017-01-30 DIAGNOSIS — E1140 Type 2 diabetes mellitus with diabetic neuropathy, unspecified: Secondary | ICD-10-CM

## 2017-01-30 DIAGNOSIS — B351 Tinea unguium: Secondary | ICD-10-CM | POA: Diagnosis not present

## 2017-01-30 NOTE — Progress Notes (Signed)
Patient ID: [name], female   DOB: 1940/12/14, 76 y.o.   MRN: [id number] Complaint:  Visit Type: Patient returns to my office for continued preventative foot care services. Complaint: Patient states" my nails have grown long and thick and become painful to walk and wear shoes" Patient has been diagnosed with DM with no foot complications. The patient presents for preventative foot care services. No changes to ROS  Podiatric Exam: Vascular: dorsalis pedis and posterior tibial pulses are palpable bilateral. Capillary return is immediate. Temperature gradient is WNL. Skin turgor WNL  Sensorium: Diminished  Semmes Weinstein monofilament test. Normal tactile sensation bilaterally. Nail Exam: Pt has thick disfigured discolored nails with subungual debris noted bilateral entire nail hallux through fifth toenails Ulcer Exam: There is no evidence of ulcer or pre-ulcerative changes or infection. Orthopedic Exam: Muscle tone and strength are WNL. No limitations in general ROM. No crepitus or effusions noted. Foot type and digits show no abnormalities. Bony prominences are unremarkable. Skin: No Porokeratosis. No infection or ulcers  Diagnosis:  Onychomycosis, , Pain in right toe, pain in left toes  Treatment & Plan Procedures and Treatment: Consent by patient was obtained for treatment procedures. The patient understood the discussion of treatment and procedures well. All questions were answered thoroughly reviewed. Debridement of mycotic and hypertrophic toenails, 1 through 5 bilateral and clearing of subungual debris. No ulceration, no infection noted.  Return Visit-Office Procedure: Patient instructed to return to the office for a follow up visit 3 months for continued evaluation and treatment.    Gardiner Barefoot DPM

## 2017-02-04 ENCOUNTER — Ambulatory Visit (INDEPENDENT_AMBULATORY_CARE_PROVIDER_SITE_OTHER): Payer: Medicare Other | Admitting: Otolaryngology

## 2017-02-04 DIAGNOSIS — H9209 Otalgia, unspecified ear: Secondary | ICD-10-CM

## 2017-02-04 DIAGNOSIS — J310 Chronic rhinitis: Secondary | ICD-10-CM | POA: Diagnosis not present

## 2017-03-01 ENCOUNTER — Telehealth (HOSPITAL_COMMUNITY): Payer: Self-pay | Admitting: Adult Health

## 2017-03-01 ENCOUNTER — Telehealth (HOSPITAL_COMMUNITY): Payer: Self-pay

## 2017-03-01 NOTE — Telephone Encounter (Signed)
Patient called extremely upset, demanding to speak with "Gretchen". Explained that Elzie Rings was in the middle of clinic and seeing patients and could not talk at this time. I asked patient if there was anything I could help her with. Patient said no. I asked patient if I could tell Elzie Rings what was wrong. Mrs.Boylan says it is about a test. I asked if it was a lab test or x-ray??? She states it is "where they run a light down your throat" and that it  "took her 2 months to order". Patient states she has already had it (?EGD) done and "we must not look at records". She stated that she has no intention of having the test performed again. She also stated if I don't get Elzie Rings to call her back today, she is going to take "further action". Patient states she is tired of dealing with our "so-called program". Made sure I had the correct number to contact patient. [phone]). Explained to patient I would give the message to [facility] and do my best to get her to call. Patient verbalized understanding but was still upset when call ended.

## 2017-03-01 NOTE — Telephone Encounter (Signed)
I had a lengthy telephone conversation with [name] regarding her multiple concerns.  She reports being concerned about her continued anemia and IV iron requirements. States that she received a call from [facility], regarding a request to see a GI specialist. She does not understand why she was referred to see a gastroenterologist "because I seen them in the past and he never confined anything wrong."  I shared with her recommendations to return to see GI specialist given her continued anemia and iron requirements. This is based on not only our recommendations, but also those of her previously treating GI physician, Dr. Hilarie Fredrickson, who advised a video capsule study if warranted for progressive anemia or iron requirements. Of course, if she does not feel that this consultation and evaluation will be helpful for her, it is ultimately her decision if she decides to go. I reinforced our recommendations, and encouraged her to direct her GI specific concerns to the gastroenterologist or her PCP.    I offered sincere apologies for any confusion regarding her last visit, lab evaluation, and consultation. I reminded her that we discussed these recommendations at our last visit, and she is always welcome to call us with questions. Certainly, she is welcome to seek a second opinion as well.  At the end of the call, [name] voiced understanding for our plan and recommendations and will consider seeing GI specialist.  I shared with her that we may not find the source of her continued iron sufficiency, but when anemia/IV iron requirements persist, additional evaluation is recommended.  She ultimately agreed with this plan and states she will contact GI office to get her appointment scheduled.   Encouraged her to call us with any additional questions or concerns regarding her care.   Mike Craze, NP [place] [phone]

## 2017-03-06 ENCOUNTER — Encounter (HOSPITAL_COMMUNITY): Payer: Medicare Other | Attending: Oncology

## 2017-03-06 DIAGNOSIS — D72829 Elevated white blood cell count, unspecified: Secondary | ICD-10-CM | POA: Diagnosis not present

## 2017-03-06 DIAGNOSIS — D500 Iron deficiency anemia secondary to blood loss (chronic): Secondary | ICD-10-CM | POA: Insufficient documentation

## 2017-03-06 LAB — CBC WITH DIFFERENTIAL/PLATELET
Basophils Absolute: 0.1 10*3/uL (ref 0.0–0.1)
Basophils Relative: 1 %
EOS PCT: 3 %
Eosinophils Absolute: 0.3 10*3/uL (ref 0.0–0.7)
HEMATOCRIT: 41.9 % (ref 36.0–46.0)
Hemoglobin: 12.3 g/dL (ref 12.0–15.0)
LYMPHS ABS: 1.7 10*3/uL (ref 0.7–4.0)
Lymphocytes Relative: 19 %
MCH: 23.3 pg — ABNORMAL LOW (ref 26.0–34.0)
MCHC: 29.4 g/dL — AB (ref 30.0–36.0)
MCV: 79.4 fL (ref 78.0–100.0)
MONO ABS: 0.5 10*3/uL (ref 0.1–1.0)
MONOS PCT: 6 %
NEUTROS ABS: 6.5 10*3/uL (ref 1.7–7.7)
Neutrophils Relative %: 71 %
Platelets: 320 10*3/uL (ref 150–400)
RBC: 5.28 MIL/uL — ABNORMAL HIGH (ref 3.87–5.11)
RDW: 24.5 % — ABNORMAL HIGH (ref 11.5–15.5)
WBC: 9.1 10*3/uL (ref 4.0–10.5)

## 2017-03-06 LAB — IRON AND TIBC
Iron: 40 ug/dL (ref 28–170)
Saturation Ratios: 9 % — ABNORMAL LOW (ref 10.4–31.8)
TIBC: 444 ug/dL (ref 250–450)
UIBC: 404 ug/dL

## 2017-03-06 LAB — FERRITIN: Ferritin: 17 ng/mL (ref 11–307)

## 2017-03-07 ENCOUNTER — Other Ambulatory Visit (HOSPITAL_COMMUNITY): Payer: Self-pay | Admitting: Adult Health

## 2017-03-13 ENCOUNTER — Encounter (HOSPITAL_BASED_OUTPATIENT_CLINIC_OR_DEPARTMENT_OTHER): Payer: Medicare Other

## 2017-03-13 ENCOUNTER — Encounter (HOSPITAL_COMMUNITY): Payer: Self-pay

## 2017-03-13 VITALS — BP 123/71 | HR 78 | Temp 97.5°F | Resp 20

## 2017-03-13 DIAGNOSIS — D500 Iron deficiency anemia secondary to blood loss (chronic): Secondary | ICD-10-CM

## 2017-03-13 DIAGNOSIS — D509 Iron deficiency anemia, unspecified: Secondary | ICD-10-CM

## 2017-03-13 DIAGNOSIS — D508 Other iron deficiency anemias: Secondary | ICD-10-CM

## 2017-03-13 MED ORDER — SODIUM CHLORIDE 0.9 % IV SOLN
INTRAVENOUS | Status: DC
  Administered 2017-03-13: 14:00:00 via INTRAVENOUS

## 2017-03-13 MED ORDER — SODIUM CHLORIDE 0.9 % IV SOLN
510.0000 mg | Freq: Once | INTRAVENOUS | Status: AC
  Administered 2017-03-13: 510 mg via INTRAVENOUS
  Filled 2017-03-13: qty 17

## 2017-03-13 NOTE — Patient Instructions (Signed)
[facility] at [facility] Discharge Instructions  RECOMMENDATIONS MADE BY THE CONSULTANT AND ANY TEST RESULTS WILL BE SENT TO YOUR REFERRING PHYSICIAN.  Received Feraheme infusion today.Follow-up as scheduled. Call clinic for any questions or concerns  Thank you for choosing [facility] at [facility] to provide your oncology and hematology care.  To afford each patient quality time with our provider, please arrive at least 15 minutes before your scheduled appointment time.    If you have a lab appointment with the Cancer Center please come in thru the  Main Entrance and check in at the main information desk  You need to re-schedule your appointment should you arrive 10 or more minutes late.  We strive to give you quality time with our providers, and arriving late affects you and other patients whose appointments are after yours.  Also, if you no show three or more times for appointments you may be dismissed from the clinic at the providers discretion.     Again, thank you for choosing [facility].  Our hope is that these requests will decrease the amount of time that you wait before being seen by our physicians.       _____________________________________________________________  Should you have questions after your visit to  Cancer Center, please contact our office at [phone] between the hours of 8:30 a.m. and 4:30 p.m.  Voicemails left after 4:30 p.m. will not be returned until the following business day.  For prescription refill requests, have your pharmacy contact our office.       Resources For Cancer Patients and their Caregivers ? American Cancer Society: Can assist with transportation, wigs, general needs, runs Look Good Feel Better.        1-888-227-6333 ? Cancer Care: Provides financial assistance, online support groups, medication/co-pay assistance.  1-800-813-HOPE (4673) ? [facility] Assists [place] cancer patients and their families through emotional , educational and financial support.  336-427-4357 ? [place] DSS Where to apply for food stamps, Medicaid and utility assistance. 336-342-1394 ? RCATS: Transportation to medical appointments. 336-347-2287 ? Social Security Administration: May apply for disability if have a Stage IV cancer. 336-342-7796 1-800-772-1213 ? [place] Aging, Disability and Transit Services: Assists with nutrition, care and transit needs. 336-349-2343  Cancer Center Support Programs: @10RELATIVEDAYS@ > Cancer Support Group  2nd Tuesday of the month 1pm-2pm, Journey Room  > Creative Journey  3rd Tuesday of the month 1130am-1pm, Journey Room  > Look Good Feel Better  1st Wednesday of the month 10am-12 noon, Journey Room (Call American Cancer Society to register 1-800-395-5775)   

## 2017-03-13 NOTE — Progress Notes (Signed)
[name] tolerated Feraheme infusion well without complaints or incident. VSS upon discharge. Pt discharged via wheelchair in satisfactory condition accompanied by caregiver

## 2017-05-01 ENCOUNTER — Encounter: Payer: Self-pay | Admitting: Podiatry

## 2017-05-01 ENCOUNTER — Ambulatory Visit (INDEPENDENT_AMBULATORY_CARE_PROVIDER_SITE_OTHER): Payer: Medicare Other | Admitting: Podiatry

## 2017-05-01 DIAGNOSIS — B351 Tinea unguium: Secondary | ICD-10-CM

## 2017-05-01 DIAGNOSIS — M79676 Pain in unspecified toe(s): Secondary | ICD-10-CM | POA: Diagnosis not present

## 2017-05-01 NOTE — Progress Notes (Signed)
Patient ID: [name], female   DOB: [birth year], 76 y.o.   MRN: [id number] Complaint:  Visit Type: Patient returns to my office for continued preventative foot care services. Complaint: Patient states" my nails have grown long and thick and become painful to walk and wear shoes" Patient has been diagnosed with DM with no foot complications. The patient presents for preventative foot care services. No changes to ROS  Podiatric Exam: Vascular: dorsalis pedis and posterior tibial pulses are palpable bilateral. Capillary return is immediate. Temperature gradient is WNL. Skin turgor WNL  Sensorium: Diminished  Semmes Weinstein monofilament test. Normal tactile sensation bilaterally. Nail Exam: Pt has thick disfigured discolored nails with subungual debris noted bilateral entire nail hallux through fifth toenails Ulcer Exam: There is no evidence of ulcer or pre-ulcerative changes or infection. Orthopedic Exam: Muscle tone and strength are WNL. No limitations in general ROM. No crepitus or effusions noted. Foot type and digits show no abnormalities. Bony prominences are unremarkable. Skin: No Porokeratosis. No infection or ulcers  Diagnosis:  Onychomycosis, , Pain in right toe, pain in left toes  Treatment & Plan Procedures and Treatment: Consent by patient was obtained for treatment procedures. The patient understood the discussion of treatment and procedures well. All questions were answered thoroughly reviewed. Debridement of mycotic and hypertrophic toenails, 1 through 5 bilateral and clearing of subungual debris. No ulceration, no infection noted.  Return Visit-Office Procedure: Patient instructed to return to the office for a follow up visit 3 months for continued evaluation and treatment.    Gardiner Barefoot DPM

## 2017-05-06 ENCOUNTER — Other Ambulatory Visit (HOSPITAL_COMMUNITY): Payer: Self-pay | Admitting: Adult Health

## 2017-05-06 ENCOUNTER — Encounter (HOSPITAL_COMMUNITY): Payer: Medicare Other | Attending: Oncology

## 2017-05-06 DIAGNOSIS — D72829 Elevated white blood cell count, unspecified: Secondary | ICD-10-CM | POA: Insufficient documentation

## 2017-05-06 DIAGNOSIS — D500 Iron deficiency anemia secondary to blood loss (chronic): Secondary | ICD-10-CM | POA: Insufficient documentation

## 2017-05-06 LAB — CBC WITH DIFFERENTIAL/PLATELET
BASOS ABS: 0.1 10*3/uL (ref 0.0–0.1)
BASOS PCT: 0 %
EOS ABS: 0.3 10*3/uL (ref 0.0–0.7)
EOS PCT: 3 %
HCT: 42.6 % (ref 36.0–46.0)
HEMOGLOBIN: 12.8 g/dL (ref 12.0–15.0)
Lymphocytes Relative: 17 %
Lymphs Abs: 2 10*3/uL (ref 0.7–4.0)
MCH: 24.1 pg — ABNORMAL LOW (ref 26.0–34.0)
MCHC: 30 g/dL (ref 30.0–36.0)
MCV: 80.1 fL (ref 78.0–100.0)
Monocytes Absolute: 0.7 10*3/uL (ref 0.1–1.0)
Monocytes Relative: 6 %
NEUTROS PCT: 74 %
Neutro Abs: 8.9 10*3/uL — ABNORMAL HIGH (ref 1.7–7.7)
PLATELETS: 334 10*3/uL (ref 150–400)
RBC: 5.32 MIL/uL — AB (ref 3.87–5.11)
RDW: 18.9 % — ABNORMAL HIGH (ref 11.5–15.5)
WBC: 12 10*3/uL — AB (ref 4.0–10.5)

## 2017-05-06 LAB — FERRITIN: FERRITIN: 15 ng/mL (ref 11–307)

## 2017-05-06 LAB — IRON AND TIBC
IRON: 23 ug/dL — AB (ref 28–170)
SATURATION RATIOS: 5 % — AB (ref 10.4–31.8)
TIBC: 447 ug/dL (ref 250–450)
UIBC: 424 ug/dL

## 2017-05-08 ENCOUNTER — Ambulatory Visit (INDEPENDENT_AMBULATORY_CARE_PROVIDER_SITE_OTHER): Payer: Medicare Other | Admitting: Internal Medicine

## 2017-05-08 ENCOUNTER — Encounter: Payer: Self-pay | Admitting: Internal Medicine

## 2017-05-08 VITALS — BP 130/72 | HR 72

## 2017-05-08 DIAGNOSIS — D509 Iron deficiency anemia, unspecified: Secondary | ICD-10-CM

## 2017-05-08 NOTE — Progress Notes (Signed)
Patient ID: [name], female   DOB: Jan 22, 1941, 76 y.o.   MRN: [id number] HPI: [name] is a 76 year old female with a past medical history of iron deficiency anemia, adenomatous colon polyps, diverticulitis status post perforation with sigmoid resection and colostomy formation status post colostomy takedown with 3 anastomosis in 2013, obesity, sleep apnea, hypertension, diabetes, COPD on nocturnal oxygen who is seen in consultation at the request of Dr. Hilma Favors to evaluate iron deficiency anemia. She is here today with her husband and a health aide that takes care of her on a daily basis.  I saw her for this issue in April 2016. She was opposed to colonoscopy at that time and came for upper endoscopy which revealed some mild H. pylori negative gastritis. The exam was otherwise unremarkable. She did complete Cologuard in 2016 which was negative.  She has intermittently required IV iron supplementation and has IV iron scheduled for later this week. She was seen by Mike Craze, NP at [facility].  From a GI perspective she denies complaint. She reports her bowel habits of been regular. She has not seen visible blood or melena. She denies abdominal pain. Denies dysphagia and odynophagia. She does use MiraLAX on a daily basis and with this avoids constipation.  She completed FOBTs at home 3 for Dr. Hilma Favors assisted by her home health aide. These were negative.  Past Medical History:  Diagnosis Date  . Anemia   . Arthritis   . Bronchitis 05/2014  . Cellulitis of lower extremity 06/2014   bilateral  . COPD (chronic obstructive pulmonary disease) ([place])   . Diabetes mellitus   . Diverticulitis   . Gallstone   . Hypertension   . MRSA (methicillin resistant Staphylococcus aureus)    has now "tested negative"- no open wounds as of 02-10-15  . Obesity   . Shortness of breath    with exertion   . Sleep apnea    cpap at 10   . Sleep apnea   . Thyroid disease   . Tubular adenoma  of colon     Past Surgical History:  Procedure Laterality Date  . ABDOMINAL HYSTERECTOMY  1979  . ABDOMINAL HYSTERECTOMY  1978  . APPENDECTOMY    . APPLICATION OF WOUND VAC  wound vac  . APPLICATION OF WOUND VAC  06/26/2012   Procedure: APPLICATION OF WOUND VAC;  Surgeon: Gayland Curry, MD,FACS;  Location: [facility];  Service: General;;  . BACK SURGERY    . COLON RESECTION  10/09/2011   Procedure: COLON RESECTION;  Surgeon: Gayland Curry, MD;  Location: [facility] [place];  Service: General;  Laterality: N/A;  Colon Resection with colostomy  . COLON RESECTION  06/26/2012   Procedure: COLON RESECTION;  Surgeon: Gayland Curry, MD,FACS;  Location: [facility];  Service: General;  Laterality: N/A;  . ESOPHAGOGASTRODUODENOSCOPY (EGD) WITH PROPOFOL N/A 02/17/2015   Procedure: ESOPHAGOGASTRODUODENOSCOPY (EGD) WITH PROPOFOL;  Surgeon: Jerene Bears, MD;  Location: [facility];  Service: Gastroenterology;  Laterality: N/A;  . jackson pratt    . PROCTOSCOPY  06/26/2012   Procedure: PROCTOSCOPY;  Surgeon: Gayland Curry, MD,FACS;  Location: [facility];  Service: General;  Laterality: N/A;  Rigid Proctoscopy    Outpatient Medications Prior to Visit  Medication Sig Dispense Refill  . aspirin 81 MG tablet Take 162 mg by mouth daily. Take 2 tablets, by mouth, every day    . budesonide-formoterol (SYMBICORT) 160-4.5 MCG/ACT inhaler Inhale 2 puffs into the lungs 2 (two) times  daily.    . Calcium Carb-Cholecalciferol (CALCIUM 600 + D PO) Take 2 tablets by mouth daily.    . furosemide (LASIX) 40 MG tablet Take 40 mg by mouth daily.     . Glucosamine-Chondroitin-Vit D3 1500-1200-800 MG-MG-UNIT PACK Take 2 tablets by mouth daily.    Marland Kitchen JANUVIA 100 MG tablet Take 100 mg by mouth daily.     Marland Kitchen levothyroxine (SYNTHROID, LEVOTHROID) 50 MCG tablet Take 50 mcg by mouth daily.    . meloxicam (MOBIC) 7.5 MG tablet Take 7.5 mg by mouth daily.     . metFORMIN (GLUCOPHAGE) 500 MG tablet Take 1,000 mg by mouth 2 (two) times daily with a meal.      . montelukast (SINGULAIR) 10 MG tablet Take 10 mg by mouth daily.     Marland Kitchen nystatin (MYCOSTATIN) 100000 UNIT/ML suspension Take by mouth. Swish and spit    . nystatin (MYCOSTATIN) powder Apply topically as needed.    . nystatin cream (MYCOSTATIN) Apply topically as needed.    . polyethylene glycol (MIRALAX / GLYCOLAX) packet Take 17 g by mouth daily.    . traMADol (ULTRAM) 50 MG tablet Take 50 mg by mouth at bedtime as needed for moderate pain.      Facility-Administered Medications Prior to Visit  Medication Dose Route Frequency Provider Last Rate Last Dose  . 0.9 %  sodium chloride infusion   Intravenous Continuous Penland, Kelby Fam, MD   Stopped at 07/23/16 1512    Allergies  Allergen Reactions  . Adhesive [Tape] Hives and Rash  . Codeine Itching and Other (See Comments)  . Vancomycin Rash  . Zosyn [Piperacillin Sod-Tazobactam So] Rash    Family History  Problem Relation Age of Onset  . Heart disease Father   . Diabetes Father   . Hypertension Father   . Diabetes Mother   . Hypertension Mother   . Heart disease Brother   . Diabetes Brother   . Diabetes Sister   . Colon cancer Neg Hx   . Colon polyps Neg Hx   . Esophageal cancer Neg Hx   . Kidney disease Neg Hx   . Gallbladder disease Neg Hx     Social History  Substance Use Topics  . Smoking status: Former Smoker    Quit date: 09/24/2010  . Smokeless tobacco: Never Used  . Alcohol use No    ROS: As per history of present illness, otherwise negative  BP 130/72   Pulse 72  Constitutional: Well-developed and well-nourished elderly somewhat chronically appearing female. No distress. HEENT: Normocephalic and atraumatic. Oropharynx is clear and moist. Conjunctivae are normal.  No scleral icterus. Neck: Neck supple. Trachea midline. Cardiovascular: Normal rate, regular rhythm and intact distal pulses.  Pulmonary/chest: Effort normal and breath sounds normal. No wheezing, rales or rhonchi. Abdominal: Soft, obese,  nontender, nondistended. Bowel sounds active throughout.  Extremities: no clubbing, cyanosis, 1+ edema Neurological: Alert and oriented to person place and time. Skin: Skin is warm and dry.  Psychiatric: Normal mood and affect. Behavior is normal.  RELEVANT LABS AND IMAGING: CBC    Component Value Date/Time   WBC 12.0 (H) 05/06/2017 1000   RBC 5.32 (H) 05/06/2017 1000   HGB 12.8 05/06/2017 1000   HCT 42.6 05/06/2017 1000   PLT 334 05/06/2017 1000   MCV 80.1 05/06/2017 1000   MCH 24.1 (L) 05/06/2017 1000   MCHC 30.0 05/06/2017 1000   RDW 18.9 (H) 05/06/2017 1000   LYMPHSABS 2.0 05/06/2017 1000   MONOABS 0.7 05/06/2017  1000   EOSABS 0.3 05/06/2017 1000   BASOSABS 0.1 05/06/2017 1000    CMP     Component Value Date/Time   NA 139 01/21/2015 1430   K 3.4 (L) 01/21/2015 1430   CL 98 01/21/2015 1430   CO2 30 01/21/2015 1430   GLUCOSE 149 (H) 01/21/2015 1430   BUN 20 01/21/2015 1430   CREATININE 0.83 01/21/2015 1430   CALCIUM 9.1 01/21/2015 1430   PROT 7.2 01/21/2015 1430   ALBUMIN 3.5 01/21/2015 1430   AST 16 01/21/2015 1430   ALT 18 01/21/2015 1430   ALKPHOS 79 01/21/2015 1430   BILITOT 0.8 01/21/2015 1430   GFRNONAA 67 (L) 01/21/2015 1430   GFRAA 78 (L) 01/21/2015 1430   Iron/TIBC/Ferritin/ %Sat    Component Value Date/Time   IRON 23 (L) 05/06/2017 1000   TIBC 447 05/06/2017 1000   FERRITIN 15 05/06/2017 1000   IRONPCTSAT 5 (L) 05/06/2017 1000   FOBT testing by Dr. Hilma Favors per patient and her healthcare aide - negative x 3  ASSESSMENT/PLAN: 76 year old female with a past medical history of iron deficiency anemia, adenomatous colon polyps, diverticulitis status post perforation with sigmoid resection and colostomy formation status post colostomy takedown with 3 anastomosis in 2013, obesity, sleep apnea, hypertension, diabetes, COPD on nocturnal oxygen who is seen in consultation at the request of Dr. Hilma Favors to evaluate iron deficiency anemia.   1. Chronic, IDA --  she has persistent iron deficiency anemia. She had upper endoscopy which was unremarkable for source for iron deficiency anemia 2 years ago after seeing me in clinic. She then had a negative Cologuard. She remains opposed to colonoscopy and would like to avoid this again if at all possible. I told her that my recommendation is for colonoscopy, and video capsule endoscopy if unremarkable. It is reassuring that her Cologuard was negative in 2016 and her fecal occult blood tests were negative recently.  After thorough discussion she would prefer noninvasive testing. FOBT has been done recently and thus I'm not repeating. I do think repeating Cologuard would again be reassuring and exclude colon cancer. Currently she does not have significant anemia though remains iron deficient. The purpose of endoscopies would be to find a more ominous pathology in which earlier intervention would be best. Thus of Cologuard is negative I think it is unlikely that her iron deficiency is the result of malignancy. She understands and is agreeable to colonoscopy, which would need to be performed in the outpatient hospital setting with monitored anesthesia care, in the event her Cologuard is positive  Proceed with IV iron infusions as scheduled and repeat as needed    DG:LOVFIEP, Jenny Reichmann, [address], [place] [address]

## 2017-05-08 NOTE — Patient Instructions (Addendum)
Your provider has ordered Cologuard testing as an option for colon cancer screening. This is performed by [employer] and may be out of network with your insurance. PRIOR to completing the test, it is YOUR responsibility to contact your insurance about covered benefits for this test. Your out of pocket expense could be anywhere from $0.00 to $649.00.   When you call to check coverage with your insurer, please provide the following information:   -The ONLY provider of Cologuard is [place] code for Cologuard is [phone].  [employer] NPI # [id number]  -[employer] Tax ID # [id number]     We have already sent your demographic and insurance information to [employer] (phone number [phone]) and they should contact you within the next week regarding your test. If you have not heard from them within the next week, please call our office at [phone].    If you are age 33 or older, your body mass index should be between 23-30. Your There is no height or weight on file to calculate BMI. If this is out of the aforementioned range listed, please consider follow up with your Primary Care Provider.  If you are age 26 or younger, your body mass index should be between 19-25. Your There is no height or weight on file to calculate BMI. If this is out of the aformentioned range listed, please consider follow up with your Primary Care Provider.

## 2017-05-09 ENCOUNTER — Encounter (HOSPITAL_BASED_OUTPATIENT_CLINIC_OR_DEPARTMENT_OTHER): Payer: Medicare Other

## 2017-05-09 ENCOUNTER — Encounter (HOSPITAL_COMMUNITY): Payer: Self-pay

## 2017-05-09 VITALS — BP 137/66 | HR 71 | Temp 97.5°F | Resp 20

## 2017-05-09 DIAGNOSIS — D508 Other iron deficiency anemias: Secondary | ICD-10-CM

## 2017-05-09 DIAGNOSIS — D500 Iron deficiency anemia secondary to blood loss (chronic): Secondary | ICD-10-CM

## 2017-05-09 DIAGNOSIS — D509 Iron deficiency anemia, unspecified: Secondary | ICD-10-CM | POA: Diagnosis not present

## 2017-05-09 MED ORDER — SODIUM CHLORIDE 0.9% FLUSH
3.0000 mL | Freq: Once | INTRAVENOUS | Status: AC | PRN
  Administered 2017-05-09: 3 mL via INTRAVENOUS

## 2017-05-09 MED ORDER — SODIUM CHLORIDE 0.9 % IV SOLN
INTRAVENOUS | Status: DC
Start: 2017-05-09 — End: 2017-05-09
  Administered 2017-05-09: 14:00:00 via INTRAVENOUS

## 2017-05-09 MED ORDER — SODIUM CHLORIDE 0.9 % IV SOLN
510.0000 mg | Freq: Once | INTRAVENOUS | Status: AC
  Administered 2017-05-09: 510 mg via INTRAVENOUS
  Filled 2017-05-09: qty 17

## 2017-05-09 NOTE — Patient Instructions (Signed)
[facility] at [facility] Discharge Instructions  RECOMMENDATIONS MADE BY THE CONSULTANT AND ANY TEST RESULTS WILL BE SENT TO YOUR REFERRING PHYSICIAN.  Received Feraheme infusion today.Follow-up as scheduled. Call clinic for any questions or concerns  Thank you for choosing [facility] at [facility] to provide your oncology and hematology care.  To afford each patient quality time with our provider, please arrive at least 15 minutes before your scheduled appointment time.    If you have a lab appointment with the Cancer Center please come in thru the  Main Entrance and check in at the main information desk  You need to re-schedule your appointment should you arrive 10 or more minutes late.  We strive to give you quality time with our providers, and arriving late affects you and other patients whose appointments are after yours.  Also, if you no show three or more times for appointments you may be dismissed from the clinic at the providers discretion.     Again, thank you for choosing [facility].  Our hope is that these requests will decrease the amount of time that you wait before being seen by our physicians.       _____________________________________________________________  Should you have questions after your visit to [facility], please contact our office at [phone] between the hours of 8:30 a.m. and 4:30 p.m.  Voicemails left after 4:30 p.m. will not be returned until the following business day.  For prescription refill requests, have your pharmacy contact our office.       Resources For Cancer Patients and their Caregivers ? American Cancer Society: Can assist with transportation, wigs, general needs, runs Look Good Feel Better.        1-888-227-6333 ? Cancer Care: Provides financial assistance, online support groups, medication/co-pay assistance.  1-800-813-HOPE (4673) ? [facility] Assists [place] cancer patients and their families through emotional , educational and financial support.  336-427-4357 ? [place] DSS Where to apply for food stamps, Medicaid and utility assistance. 336-342-1394 ? RCATS: Transportation to medical appointments. 336-347-2287 ? Social Security Administration: May apply for disability if have a Stage IV cancer. 336-342-7796 1-800-772-1213 ? [place] Aging, Disability and Transit Services: Assists with nutrition, care and transit needs. 336-349-2343  Cancer Center Support Programs: @[email or web address]@ > Cancer Support Group  2nd Tuesday of the month 1pm-2pm, Journey Room  > Creative Journey  3rd Tuesday of the month 1130am-1pm, Journey Room  > Look Good Feel Better  1st Wednesday of the month 10am-12 noon, Journey Room (Call American Cancer Society to register 1-800-395-5775)   

## 2017-05-09 NOTE — Progress Notes (Signed)
[name] tolerated Feraheme infusion well without complaints or incident. VSS upon discharge. Pt discharged via wheelchair in satisfactory condition accompanied by her caregiver 

## 2017-05-14 ENCOUNTER — Telehealth: Payer: Self-pay | Admitting: *Deleted

## 2017-05-14 NOTE — Telephone Encounter (Signed)
Dr Elouise Munroe with [facility] has called to advise that patient's insurance, [facility] Medicare will not cover Cologard testing as Medicare guidelines state patient may only have testing once every 3 years. Patient had negative Cologard testing completed 01/10/15, therefore she would not be eligible for this again until 01/09/18. Please advise.Marland KitchenMarland KitchenMarland Kitchen

## 2017-05-15 NOTE — Telephone Encounter (Signed)
Cologuard order has been cancelled, per [employer].

## 2017-05-15 NOTE — Telephone Encounter (Signed)
Stools recently heme neg with Dr. Hilma Favors (please request this documentation from PCP to confirm, though pt reports clearly remembering FOBT neg in the last several months) If so, then would recommend CBC and iron studies 3 months after IV iron Repeat Cologuard April 2019 For colonoscopy if overt bleeding or further declining Hgb and/or heme + stools (she very much wants to avoid colonoscopy if possible) See last office note

## 2017-05-21 NOTE — Telephone Encounter (Signed)
I contacted Dr Loraine Leriche office, [facility] to confirm that patient did have negative hemocult in June 2018. I have asked for fax. Fax has been sent twice but it seems there is an error in the way the computer relays the info through fax as the negative result does not seem to populate on fax. However, both times I have called [place], I have been given confirmation that hemocults were negative on 03/07/17. Is verbal confirmation ok or do you prefer strictly written confirmation?

## 2017-05-21 NOTE — Telephone Encounter (Signed)
Verbal report of negative hemoccult testing is sufficient

## 2017-05-21 NOTE — Telephone Encounter (Signed)
I have attempted to reach patient to advise of the information below. Phone line busy x 2. I will attempt to call back at a later time.

## 2017-05-22 NOTE — Telephone Encounter (Signed)
I have spoken to patient to advise that insurance will not cover cologuard until April 2019, so we will wait until that time to repeat.  I have also advised that since Dr Delanna Ahmadi office has confirmed heme negative stool recently, we will recommend CBC and IBC panel around 08/09/2017 (3 months post IV iron infusion). She states that she has routine labs completed at [facility] and will most likely have these completed there. She is also advised that traditional colonoscopy would be recommended should her hemoglobin continue to decline or should she have any heme positive stool. She is agreeable to this plan.

## 2017-06-24 ENCOUNTER — Other Ambulatory Visit (HOSPITAL_COMMUNITY): Payer: Medicare Other

## 2017-06-24 ENCOUNTER — Ambulatory Visit (HOSPITAL_COMMUNITY): Payer: Medicare Other | Admitting: Oncology

## 2017-07-02 ENCOUNTER — Encounter (HOSPITAL_BASED_OUTPATIENT_CLINIC_OR_DEPARTMENT_OTHER): Payer: Medicare Other | Admitting: Oncology

## 2017-07-02 ENCOUNTER — Encounter (HOSPITAL_COMMUNITY): Payer: Medicare Other | Attending: Oncology

## 2017-07-02 ENCOUNTER — Encounter (HOSPITAL_COMMUNITY): Payer: Self-pay | Admitting: Oncology

## 2017-07-02 VITALS — BP 126/62 | HR 78 | Resp 16

## 2017-07-02 DIAGNOSIS — Z87891 Personal history of nicotine dependence: Secondary | ICD-10-CM | POA: Diagnosis not present

## 2017-07-02 DIAGNOSIS — G4730 Sleep apnea, unspecified: Secondary | ICD-10-CM | POA: Diagnosis not present

## 2017-07-02 DIAGNOSIS — Z880 Allergy status to penicillin: Secondary | ICD-10-CM | POA: Diagnosis not present

## 2017-07-02 DIAGNOSIS — Z79899 Other long term (current) drug therapy: Secondary | ICD-10-CM | POA: Insufficient documentation

## 2017-07-02 DIAGNOSIS — J449 Chronic obstructive pulmonary disease, unspecified: Secondary | ICD-10-CM | POA: Insufficient documentation

## 2017-07-02 DIAGNOSIS — E669 Obesity, unspecified: Secondary | ICD-10-CM | POA: Insufficient documentation

## 2017-07-02 DIAGNOSIS — D509 Iron deficiency anemia, unspecified: Secondary | ICD-10-CM

## 2017-07-02 DIAGNOSIS — E079 Disorder of thyroid, unspecified: Secondary | ICD-10-CM | POA: Insufficient documentation

## 2017-07-02 DIAGNOSIS — Z881 Allergy status to other antibiotic agents status: Secondary | ICD-10-CM | POA: Diagnosis not present

## 2017-07-02 DIAGNOSIS — Z7982 Long term (current) use of aspirin: Secondary | ICD-10-CM | POA: Diagnosis not present

## 2017-07-02 DIAGNOSIS — I10 Essential (primary) hypertension: Secondary | ICD-10-CM | POA: Insufficient documentation

## 2017-07-02 DIAGNOSIS — Z7984 Long term (current) use of oral hypoglycemic drugs: Secondary | ICD-10-CM | POA: Diagnosis not present

## 2017-07-02 DIAGNOSIS — E119 Type 2 diabetes mellitus without complications: Secondary | ICD-10-CM | POA: Insufficient documentation

## 2017-07-02 DIAGNOSIS — D508 Other iron deficiency anemias: Secondary | ICD-10-CM

## 2017-07-02 DIAGNOSIS — D72829 Elevated white blood cell count, unspecified: Secondary | ICD-10-CM

## 2017-07-02 DIAGNOSIS — D500 Iron deficiency anemia secondary to blood loss (chronic): Secondary | ICD-10-CM | POA: Diagnosis present

## 2017-07-02 LAB — IRON AND TIBC
IRON: 28 ug/dL (ref 28–170)
Saturation Ratios: 6 % — ABNORMAL LOW (ref 10.4–31.8)
TIBC: 451 ug/dL — ABNORMAL HIGH (ref 250–450)
UIBC: 423 ug/dL

## 2017-07-02 LAB — CBC WITH DIFFERENTIAL/PLATELET
BASOS ABS: 0 10*3/uL (ref 0.0–0.1)
BASOS PCT: 0 %
Eosinophils Absolute: 0.4 10*3/uL (ref 0.0–0.7)
Eosinophils Relative: 3 %
HEMATOCRIT: 44.4 % (ref 36.0–46.0)
Hemoglobin: 12.9 g/dL (ref 12.0–15.0)
LYMPHS PCT: 15 %
Lymphs Abs: 1.8 10*3/uL (ref 0.7–4.0)
MCH: 23.4 pg — ABNORMAL LOW (ref 26.0–34.0)
MCHC: 29.1 g/dL — ABNORMAL LOW (ref 30.0–36.0)
MCV: 80.4 fL (ref 78.0–100.0)
MONO ABS: 0.5 10*3/uL (ref 0.1–1.0)
Monocytes Relative: 4 %
NEUTROS ABS: 9.4 10*3/uL — AB (ref 1.7–7.7)
NEUTROS PCT: 78 %
Platelets: 336 10*3/uL (ref 150–400)
RBC: 5.52 MIL/uL — AB (ref 3.87–5.11)
RDW: 19.2 % — AB (ref 11.5–15.5)
WBC: 12.1 10*3/uL — AB (ref 4.0–10.5)

## 2017-07-02 LAB — FERRITIN: FERRITIN: 16 ng/mL (ref 11–307)

## 2017-07-02 NOTE — Progress Notes (Signed)
[place],  [id number]   CLINIC:  Medical Oncology/Hematology  PCP:  Sharilyn Sites, [place] [address] [phone]   REASON FOR VISIT:  Follow-up for Iron deficiency anemia due to chronic blood loss  CURRENT THERAPY: IV iron prn     HISTORY OF PRESENT ILLNESS:  (From Dr. Metro Kung last note on 07/05/16)     INTERVAL HISTORY:  [name] "[name]" 76 y.o. female presents with her caregiver, Glenard Haring, for continued follow-up for iron deficiency anemia.   Patient presents today for continued follow-up for her anemia. She received a dose of Feraheme on 05/09/2017. Hemoglobin is 12.9 g/dL today. She states she has a chronic wound umbilicus which were intermittently drained over the years since she's had her surgery in 2013. Recently started draining again. She denies any associated fevers or chills. She denies any chest pain, shortness breath, abdominal pain, focal weakness.   REVIEW OF SYSTEMS:  Review of Systems  Constitutional: Negative for appetite change, chills, fatigue and fever.  HENT:   Positive for mouth sores (chronic right tongue sores).   Eyes: Negative.   Respiratory: Positive for shortness of breath (dyspnea on exertion). Negative for cough and hemoptysis.   Cardiovascular: Positive for leg swelling. Negative for chest pain.  Gastrointestinal: Negative.  Negative for abdominal pain, blood in stool, constipation, diarrhea, nausea and vomiting.  Endocrine: Negative.   Genitourinary: Negative.  Negative for dysuria, hematuria and vaginal bleeding.   Skin: Positive for wound (umbilicus, draining). Negative for rash.  Neurological: Negative for dizziness and headaches.  Hematological: Negative.   Psychiatric/Behavioral: Negative.      PAST MEDICAL/SURGICAL HISTORY:  Past Medical History:  Diagnosis Date  . Anemia   . Arthritis   . Bronchitis 05/2014  . Cellulitis of lower extremity  06/2014   bilateral  . COPD (chronic obstructive pulmonary disease) ([place])   . Diabetes mellitus   . Diverticulitis   . Gallstone   . Hypertension   . MRSA (methicillin resistant Staphylococcus aureus)    has now "tested negative"- no open wounds as of 02-10-15  . Obesity   . Shortness of breath    with exertion   . Sleep apnea    cpap at 10   . Sleep apnea   . Thyroid disease   . Tubular adenoma of colon    Past Surgical History:  Procedure Laterality Date  . ABDOMINAL HYSTERECTOMY  1979  . ABDOMINAL HYSTERECTOMY  1978  . APPENDECTOMY    . APPLICATION OF WOUND VAC  wound vac  . APPLICATION OF WOUND VAC  06/26/2012   Procedure: APPLICATION OF WOUND VAC;  Surgeon: Gayland Curry, MD,FACS;  Location: [facility];  Service: General;;  . BACK SURGERY    . COLON RESECTION  10/09/2011   Procedure: COLON RESECTION;  Surgeon: Gayland Curry, MD;  Location: [facility];  Service: General;  Laterality: N/A;  Colon Resection with colostomy  . COLON RESECTION  06/26/2012   Procedure: COLON RESECTION;  Surgeon: Gayland Curry, MD,FACS;  Location: [facility];  Service: General;  Laterality: N/A;  . ESOPHAGOGASTRODUODENOSCOPY (EGD) WITH PROPOFOL N/A 02/17/2015   Procedure: ESOPHAGOGASTRODUODENOSCOPY (EGD) WITH PROPOFOL;  Surgeon: Jerene Bears, MD;  Location: [facility];  Service: Gastroenterology;  Laterality: N/A;  . jackson pratt    . PROCTOSCOPY  06/26/2012   Procedure: PROCTOSCOPY;  Surgeon: Gayland Curry, MD,FACS;  Location: [facility];  Service: General;  Laterality: N/A;  Rigid Proctoscopy     SOCIAL HISTORY:  Social History   Social History  . Marital status: Married    Spouse name: N/A  . Number of children: 0  . Years of education: N/A   Occupational History  . Retired    Social History Main Topics  . Smoking status: Former Smoker    Quit date: 09/24/2010  . Smokeless tobacco: Never Used  . Alcohol use No  . Drug use: No  . Sexual activity: Not Currently    Birth control/ protection: Surgical    Other Topics Concern  . Not on file   Social History Narrative  . No narrative on file    FAMILY HISTORY:  Family History  Problem Relation Age of Onset  . Heart disease Father   . Diabetes Father   . Hypertension Father   . Diabetes Mother   . Hypertension Mother   . Heart disease Brother   . Diabetes Brother   . Diabetes Sister   . Colon cancer Neg Hx   . Colon polyps Neg Hx   . Esophageal cancer Neg Hx   . Kidney disease Neg Hx   . Gallbladder disease Neg Hx     CURRENT MEDICATIONS:  Outpatient Encounter Prescriptions as of 07/02/2017  Medication Sig Note  . aspirin 81 MG tablet Take 162 mg by mouth daily. Take 2 tablets, by mouth, every day   . budesonide-formoterol (SYMBICORT) 160-4.5 MCG/ACT inhaler Inhale 2 puffs into the lungs 2 (two) times daily.   . Calcium Carb-Cholecalciferol (CALCIUM 600 + D PO) Take 2 tablets by mouth daily.   . calcium carbonate (OSCAL) 1500 (600 Ca) MG TABS tablet Take by mouth 2 (two) times daily with a meal.   . clobetasol (TEMOVATE) 0.05 % GEL Apply topically 2 (two) times daily.   . furosemide (LASIX) 40 MG tablet Take 40 mg by mouth daily.  12/27/2014: Received from: External Pharmacy  . Glucosamine-Chondroitin-Vit D3 1500-1200-800 MG-MG-UNIT PACK Take 2 tablets by mouth daily.   Marland Kitchen guaiFENesin (MUCINEX) 600 MG 12 hr tablet Take by mouth 3 (three) times daily.   Marland Kitchen JANUVIA 100 MG tablet Take 100 mg by mouth daily.  12/27/2014: Received from: External Pharmacy  . levothyroxine (SYNTHROID, LEVOTHROID) 50 MCG tablet Take 50 mcg by mouth daily. 08/25/2015: Received from: External Pharmacy Received Sig:   . meloxicam (MOBIC) 7.5 MG tablet Take 7.5 mg by mouth daily.  05/09/2015: Pt is taking this medication again  . metFORMIN (GLUCOPHAGE) 500 MG tablet Take 1,000 mg by mouth 2 (two) times daily with a meal.    . montelukast (SINGULAIR) 10 MG tablet Take 10 mg by mouth daily.    Marland Kitchen nystatin (MYCOSTATIN) 100000 UNIT/ML suspension Take by mouth. Swish  and spit 08/25/2015: Received from: External Pharmacy Received Sig:   . nystatin (MYCOSTATIN) powder Apply topically as needed. 08/25/2015: Received from: External Pharmacy Received Sig:   . nystatin cream (MYCOSTATIN) Apply topically as needed. 08/25/2015: Received from: External Pharmacy Received Sig:   . OXYGEN Inhale into the lungs at bedtime.   . polyethylene glycol (MIRALAX / GLYCOLAX) packet Take 17 g by mouth daily.   . traMADol (ULTRAM) 50 MG tablet Take 50 mg by mouth at bedtime as needed for moderate pain.    . Wheat Dextrin (BENEFIBER) POWD Take by mouth as directed.    Facility-Administered Encounter Medications as of 07/02/2017  Medication  . 0.9 %  sodium chloride infusion    ALLERGIES:  Allergies  Allergen Reactions  .  Adhesive [Tape] Hives and Rash  . Codeine Itching and Other (See Comments)  . Vancomycin Rash  . Zosyn [Piperacillin Sod-Tazobactam So] Rash     PHYSICAL EXAM:  ECOG Performance status: 2-3 - Symptomatic; requires assistance >50% of time.   Vitals:   07/02/17 1113  BP: 126/62  Pulse: 78  Resp: 16  SpO2: (!) 89%   There were no vitals filed for this visit.  Physical Exam  Constitutional: She is oriented to person, place, and time.  Seen seated in wheelchair   HENT:  Head: Normocephalic and atraumatic.  Eyes: Pupils are equal, round, and reactive to light. Conjunctivae are normal. No scleral icterus.  Neck: Normal range of motion. Neck supple.  Cardiovascular: Normal rate, regular rhythm and normal heart sounds.   Pulmonary/Chest: Effort normal. No respiratory distress. She has no wheezes. She has no rales.  Abdominal: Soft. Bowel sounds are normal. There is no tenderness. There is no rebound and no guarding.  Musculoskeletal: Normal range of motion. She exhibits edema (1-2+ BLE, symmetric ).  Lymphadenopathy:    She has no cervical adenopathy.       Right: No supraclavicular adenopathy present.       Left: No supraclavicular adenopathy  present.  Neurological: She is alert and oriented to person, place, and time. No cranial nerve deficit. Gait normal.  Skin: Skin is warm and dry. No rash noted. No pallor.  Psychiatric: Mood, memory, affect and judgment normal.  Nursing note and vitals reviewed.    LABORATORY DATA:  I have reviewed the labs as listed.  CBC    Component Value Date/Time   WBC 12.1 (H) 07/02/2017 1041   RBC 5.52 (H) 07/02/2017 1041   HGB 12.9 07/02/2017 1041   HCT 44.4 07/02/2017 1041   PLT 336 07/02/2017 1041   MCV 80.4 07/02/2017 1041   MCH 23.4 (L) 07/02/2017 1041   MCHC 29.1 (L) 07/02/2017 1041   RDW 19.2 (H) 07/02/2017 1041   LYMPHSABS 1.8 07/02/2017 1041   MONOABS 0.5 07/02/2017 1041   EOSABS 0.4 07/02/2017 1041   BASOSABS 0.0 07/02/2017 1041   CMP Latest Ref Rng & Units 01/21/2015 06/25/2014 06/08/2014  Glucose 70 - 99 mg/dL 149(H) 236(H) 285(H)  BUN 6 - 23 mg/dL 20 16 39(H)  Creatinine 0.50 - 1.10 mg/dL 0.83 0.89 0.90  Sodium 135 - 145 mmol/L 139 140 140  Potassium 3.5 - 5.1 mmol/L 3.4(L) 4.6 5.8(H)  Chloride 96 - 112 mmol/L 98 98 102  CO2 19 - 32 mmol/L 30 28 29   Calcium 8.4 - 10.5 mg/dL 9.1 8.8 8.4  Total Protein 6.0 - 8.3 g/dL 7.2 6.9 -  Total Bilirubin 0.3 - 1.2 mg/dL 0.8 0.5 -  Alkaline Phos 39 - 117 U/L 79 94 -  AST 0 - 37 U/L 16 22 -  ALT 0 - 35 U/L 18 57(H) -   Results for LAQUETTA, RACEY (MRN [id number])   Ref. Range 02/02/2016 14:18 03/23/2016 12:51 07/05/2016 12:50  Ferritin Latest Ref Range: 11 - 307 ng/mL 12 18 9  (L)      PENDING LABS:  Ferritin pending   DIAGNOSTIC IMAGING:    PATHOLOGY:  Last EGD: 02/17/15     ASSESSMENT & PLAN:   Iron deficiency anemia:  -Anemia thought to be in setting of chronic GI blood loss. Last IV iron infusion 05/09/17 -Hemoglobin12.9 g/dL today Iron studies pending today.  -Labs every 2 months (CBC with diff, ferritin, iron/TIBC).  -Return to cancer center in 6 months with  labs (CBC with diff, ferritin, iron/TIBC).    Leukocytosis/Neutrophilia:  -Previous evaluation with flow cytometry in 02/2014 negative; BCR-ABL negative. Unlikely malignant/myeloproliferative etiology.  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 WBC 12k today. She will go see her surgeon or PCP for follow up.    Dispo:  -Labs every 2 months (CBC with diff, ferritin, iron/TIBC).  -Return to cancer center in 6 months with labs (CBC with diff, ferritin, iron/TIBC).     All questions were answered to patient's stated satisfaction. Encouraged patient to call with any new concerns or questions before her next visit to the cancer center and we can certain see her sooner, if needed.    Twana First, MD

## 2017-07-03 ENCOUNTER — Other Ambulatory Visit (HOSPITAL_COMMUNITY): Payer: Self-pay | Admitting: Oncology

## 2017-07-03 ENCOUNTER — Other Ambulatory Visit (HOSPITAL_COMMUNITY): Payer: Self-pay | Admitting: Adult Health

## 2017-07-08 ENCOUNTER — Encounter (HOSPITAL_COMMUNITY): Payer: Medicare Other | Attending: Oncology

## 2017-07-08 ENCOUNTER — Encounter (HOSPITAL_COMMUNITY): Payer: Self-pay

## 2017-07-08 VITALS — BP 114/48 | HR 82 | Temp 97.6°F | Resp 20

## 2017-07-08 DIAGNOSIS — D509 Iron deficiency anemia, unspecified: Secondary | ICD-10-CM

## 2017-07-08 DIAGNOSIS — D508 Other iron deficiency anemias: Secondary | ICD-10-CM

## 2017-07-08 DIAGNOSIS — D500 Iron deficiency anemia secondary to blood loss (chronic): Secondary | ICD-10-CM

## 2017-07-08 MED ORDER — SODIUM CHLORIDE 0.9 % IV SOLN
Freq: Once | INTRAVENOUS | Status: AC
  Administered 2017-07-08: 11:00:00 via INTRAVENOUS

## 2017-07-08 MED ORDER — SODIUM CHLORIDE 0.9 % IV SOLN
510.0000 mg | Freq: Once | INTRAVENOUS | Status: AC
  Administered 2017-07-08: 510 mg via INTRAVENOUS
  Filled 2017-07-08: qty 17

## 2017-07-08 NOTE — Patient Instructions (Signed)
[facility] at [facility] Discharge Instructions  RECOMMENDATIONS MADE BY THE CONSULTANT AND ANY TEST RESULTS WILL BE SENT TO YOUR REFERRING PHYSICIAN.  Received Feraheme infusion today.Follow-up as scheduled. Call clinic for any questions or concerns  Thank you for choosing [facility] at [facility] to provide your oncology and hematology care.  To afford each patient quality time with our provider, please arrive at least 15 minutes before your scheduled appointment time.    If you have a lab appointment with the Cancer Center please come in thru the  Main Entrance and check in at the main information desk  You need to re-schedule your appointment should you arrive 10 or more minutes late.  We strive to give you quality time with our providers, and arriving late affects you and other patients whose appointments are after yours.  Also, if you no show three or more times for appointments you may be dismissed from the clinic at the providers discretion.     Again, thank you for choosing [facility].  Our hope is that these requests will decrease the amount of time that you wait before being seen by our physicians.       _____________________________________________________________  Should you have questions after your visit to  Cancer Center, please contact our office at [phone] between the hours of 8:30 a.m. and 4:30 p.m.  Voicemails left after 4:30 p.m. will not be returned until the following business day.  For prescription refill requests, have your pharmacy contact our office.       Resources For Cancer Patients and their Caregivers ? American Cancer Society: Can assist with transportation, wigs, general needs, runs Look Good Feel Better.        1-888-227-6333 ? Cancer Care: Provides financial assistance, online support groups, medication/co-pay assistance.  1-800-813-HOPE (4673) ? [facility] Assists [place] cancer patients and their families through emotional , educational and financial support.  336-427-4357 ? [place] DSS Where to apply for food stamps, Medicaid and utility assistance. 336-342-1394 ? [facility]: Transportation to medical appointments. 336-347-2287 ? Social Security Administration: May apply for disability if have a Stage IV cancer. 336-342-7796 1-800-772-1213 ? [place] Aging, Disability and Transit Services: Assists with nutrition, care and transit needs. 336-349-2343  Cancer Center Support Programs: @10RELATIVEDAYS@ > Cancer Support Group  2nd Tuesday of the month 1pm-2pm, Journey Room  > Creative Journey  3rd Tuesday of the month 1130am-1pm, Journey Room  > Look Good Feel Better  1st Wednesday of the month 10am-12 noon, Journey Room (Call American Cancer Society to register 1-800-395-5775)   

## 2017-07-08 NOTE — Progress Notes (Signed)
[name] tolerated Feraheme infusion well without complaints or incident. VSS upon discharge. Pt discharged via wheelchair in satisfactory condition accompanied by her caregiver

## 2017-07-10 ENCOUNTER — Other Ambulatory Visit (HOSPITAL_COMMUNITY): Payer: Self-pay | Admitting: Family Medicine

## 2017-07-10 ENCOUNTER — Ambulatory Visit (HOSPITAL_COMMUNITY)
Admission: RE | Admit: 2017-07-10 | Discharge: 2017-07-10 | Disposition: A | Discharge status: Home / Self Care | Payer: Medicare Other | Source: Ambulatory Visit | Attending: Family Medicine | Admitting: Family Medicine

## 2017-07-10 DIAGNOSIS — M7989 Other specified soft tissue disorders: Secondary | ICD-10-CM | POA: Insufficient documentation

## 2017-07-10 DIAGNOSIS — I517 Cardiomegaly: Secondary | ICD-10-CM | POA: Insufficient documentation

## 2017-07-10 DIAGNOSIS — J42 Unspecified chronic bronchitis: Secondary | ICD-10-CM | POA: Insufficient documentation

## 2017-07-10 DIAGNOSIS — Q2546 Tortuous aortic arch: Secondary | ICD-10-CM | POA: Insufficient documentation

## 2017-07-15 ENCOUNTER — Encounter (HOSPITAL_COMMUNITY): Payer: Self-pay

## 2017-07-15 ENCOUNTER — Encounter (HOSPITAL_BASED_OUTPATIENT_CLINIC_OR_DEPARTMENT_OTHER): Payer: Medicare Other

## 2017-07-15 ENCOUNTER — Telehealth: Payer: Self-pay

## 2017-07-15 VITALS — BP 104/51 | HR 77 | Temp 98.3°F | Resp 20

## 2017-07-15 DIAGNOSIS — D509 Iron deficiency anemia, unspecified: Secondary | ICD-10-CM

## 2017-07-15 DIAGNOSIS — D508 Other iron deficiency anemias: Secondary | ICD-10-CM

## 2017-07-15 DIAGNOSIS — D500 Iron deficiency anemia secondary to blood loss (chronic): Secondary | ICD-10-CM

## 2017-07-15 MED ORDER — SODIUM CHLORIDE 0.9 % IV SOLN
Freq: Once | INTRAVENOUS | Status: AC
  Administered 2017-07-15: 14:00:00 via INTRAVENOUS

## 2017-07-15 MED ORDER — SODIUM CHLORIDE 0.9 % IV SOLN
510.0000 mg | Freq: Once | INTRAVENOUS | Status: AC
  Administered 2017-07-15: 510 mg via INTRAVENOUS
  Filled 2017-07-15: qty 17

## 2017-07-15 MED ORDER — SODIUM CHLORIDE 0.9% FLUSH
10.0000 mL | INTRAVENOUS | Status: DC | PRN
  Administered 2017-07-15: 10 mL
  Filled 2017-07-15: qty 10

## 2017-07-15 NOTE — Telephone Encounter (Signed)
SENT NOTES TO SCHEDULING 

## 2017-07-15 NOTE — Progress Notes (Signed)
Patient tolerated iron infusion with no complaints voiced.  Good blood return noted before and after administration of iron.  Peripheral IV site clean and dry with no bruising or swelling noted at site.  Band aid applied.  VSS with discharge and left via wheelchair with care giver.  No s/s of distress noted.

## 2017-07-15 NOTE — Patient Instructions (Signed)
[place] at [facility]  Discharge Instructions:  You received an iron infusion today.  Call for any problems or questions.  Keep next scheduled appointment.  _______________________________________________________________  Thank you for choosing [place] at [facility] to provide your oncology and hematology care.  To afford each patient quality time with our providers, please arrive at least 15 minutes before your scheduled appointment.  You need to re-schedule your appointment if you arrive 10 or more minutes late.  We strive to give you quality time with our providers, and arriving late affects you and other patients whose appointments are after yours.  Also, if you no show three or more times for appointments you may be dismissed from the clinic.  Again, thank you for choosing [place] at [place] hope is that these requests will allow you access to exceptional care and in a timely manner. _______________________________________________________________  If you have questions after your visit, please contact our office at [phone] between the hours of 8:30 a.m. and 5:00 p.m. Voicemails left after 4:30 p.m. will not be returned until the following business day. _______________________________________________________________  For prescription refill requests, have your pharmacy contact our office. _______________________________________________________________  Recommendations made by the consultant and any test results will be sent to your referring physician. _______________________________________________________________

## 2017-07-16 ENCOUNTER — Telehealth: Payer: Self-pay | Admitting: *Deleted

## 2017-07-16 NOTE — Telephone Encounter (Signed)
FAXED NOTES TO [facility] 

## 2017-07-17 ENCOUNTER — Encounter: Payer: Self-pay | Admitting: Cardiovascular Disease

## 2017-07-17 ENCOUNTER — Ambulatory Visit (INDEPENDENT_AMBULATORY_CARE_PROVIDER_SITE_OTHER): Payer: Medicare Other | Admitting: Cardiovascular Disease

## 2017-07-17 VITALS — BP 134/71 | HR 93 | Ht 65.0 in

## 2017-07-17 DIAGNOSIS — R600 Localized edema: Secondary | ICD-10-CM | POA: Diagnosis not present

## 2017-07-17 DIAGNOSIS — R0602 Shortness of breath: Secondary | ICD-10-CM | POA: Diagnosis not present

## 2017-07-17 NOTE — Assessment & Plan Note (Signed)
This will loss was referred by Dr. Hilma Favors for bilateral lower extremity edema to rule out congestive heart failure. Her only cardiac risk factor is treated diabetes and remote tobacco abuse having smoked 50 pack years and quit 7 years ago. She's noticed increasing lower extremity edema. She is aware of her salt intake and restriction. Her diuretic was recently increased from 4080 mg a day resulted in marked improvement in her edema. Her oxygen saturations have improved as well. I'm going to get a 2-D echocardiogram to further evaluate.

## 2017-07-17 NOTE — Patient Instructions (Signed)

## 2017-07-17 NOTE — Progress Notes (Signed)
07/17/2017 [name]   06/27/1941  [id number]  Primary Physician Sharilyn Sites, MD Primary Cardiologist: Lorretta Harp MD FACP, [place], [place], [place]  HPI:  [name] is a 76 y.o. severely overweight married Caucasian female with no children who is accompanied by her husband [name] who is also patient line. She was referred by Dr. Hilma Favors for cardiovascular evaluation because of lower extremity edema to rule out congestive heart failure. Risk factors include 50-pack-years of tobacco abuse having quit 7 years ago. She does have COPD. She has treated diabetes but no evidence of hypertension or hyperlipidemia. She's never had a heart attack or stroke. There is no family history. She does complain of dyspnea on exertion. She is minimally ambulatory because of dyspnea. She had noticed increased lower extremity edema recently and was evaluated by her PCP. He doubled her diuretic which resulted in marked improvement in her lower extremity edema.   Current Meds  Medication Sig  . aspirin 81 MG tablet Take 162 mg by mouth daily. Take 2 tablets, by mouth, every day  . budesonide-formoterol (SYMBICORT) 160-4.5 MCG/ACT inhaler Inhale 2 puffs into the lungs 2 (two) times daily.  . Calcium Carb-Cholecalciferol (CALCIUM 600 + D PO) Take 2 tablets by mouth daily.  . calcium carbonate (OSCAL) 1500 (600 Ca) MG TABS tablet Take by mouth 2 (two) times daily with a meal.  . clobetasol (TEMOVATE) 0.05 % GEL Apply topically 2 (two) times daily.  . furosemide (LASIX) 40 MG tablet Take 40 mg by mouth daily.   . Glucosamine-Chondroitin-Vit D3 1500-1200-800 MG-MG-UNIT PACK Take 2 tablets by mouth daily.  Marland Kitchen guaiFENesin (MUCINEX) 600 MG 12 hr tablet Take by mouth 3 (three) times daily.  Marland Kitchen JANUVIA 100 MG tablet Take 100 mg by mouth daily.   Marland Kitchen levothyroxine (SYNTHROID, LEVOTHROID) 50 MCG tablet Take 50 mcg by mouth daily.  . meloxicam (MOBIC) 7.5 MG tablet Take 7.5 mg by mouth daily.   . metFORMIN (GLUCOPHAGE)  500 MG tablet Take 1,000 mg by mouth 2 (two) times daily with a meal.   . montelukast (SINGULAIR) 10 MG tablet Take 10 mg by mouth daily.   Marland Kitchen nystatin (MYCOSTATIN) 100000 UNIT/ML suspension Take by mouth. Swish and spit  . nystatin (MYCOSTATIN) powder Apply topically as needed.  . nystatin cream (MYCOSTATIN) Apply topically as needed.  . OXYGEN Inhale into the lungs at bedtime.  . polyethylene glycol (MIRALAX / GLYCOLAX) packet Take 17 g by mouth daily.  . traMADol (ULTRAM) 50 MG tablet Take 50 mg by mouth at bedtime as needed for moderate pain.   . Wheat Dextrin (BENEFIBER) POWD Take by mouth as directed.     Allergies  Allergen Reactions  . Adhesive [Tape] Hives and Rash  . Codeine Itching and Other (See Comments)  . Vancomycin Rash  . Zosyn [Piperacillin Sod-Tazobactam So] Rash    Social History   Social History  . Marital status: Married    Spouse name: N/A  . Number of children: 0  . Years of education: N/A   Occupational History  . Retired    Social History Main Topics  . Smoking status: Former Smoker    Quit date: 09/24/2010  . Smokeless tobacco: Never Used  . Alcohol use No  . Drug use: No  . Sexual activity: Not Currently    Birth control/ protection: Surgical   Other Topics Concern  . Not on file   Social History Narrative  . No narrative on file  Review of Systems: General: negative for chills, fever, night sweats or weight changes.  Cardiovascular: negative for chest pain, dyspnea on exertion, edema, orthopnea, palpitations, paroxysmal nocturnal dyspnea or shortness of breath Dermatological: negative for rash Respiratory: negative for cough or wheezing Urologic: negative for hematuria Abdominal: negative for nausea, vomiting, diarrhea, bright red blood per rectum, melena, or hematemesis Neurologic: negative for visual changes, syncope, or dizziness All other systems reviewed and are otherwise negative except as noted above.    Blood pressure  134/71, pulse 93, height 5\' 5"  (1.651 m).  General appearance: alert and no distress Neck: no adenopathy, no carotid bruit, no JVD, supple, symmetrical, trachea midline and thyroid not enlarged, symmetric, no tenderness/mass/nodules Lungs: clear to auscultation bilaterally Heart: regular rate and rhythm, S1, S2 normal, no murmur, click, rub or gallop Extremities: Trace to 1+ bilateral lower extremity edema wearing compression stockings. Pulses: 2+ and symmetric Skin: Skin color, texture, turgor normal. No rashes or lesions Neurologic: Alert and oriented X 3, normal strength and tone. Normal symmetric reflexes. Normal coordination and gait  EKG sinus rhythm at 93 with right bundle branch block. I personally reviewed this EKG.  ASSESSMENT AND PLAN:   Bilateral lower extremity edema This will loss was referred by Dr. Hilma Favors for bilateral lower extremity edema to rule out congestive heart failure. Her only cardiac risk factor is treated diabetes and remote tobacco abuse having smoked 50 pack years and quit 7 years ago. She's noticed increasing lower extremity edema. She is aware of her salt intake and restriction. Her diuretic was recently increased from 4080 mg a day resulted in marked improvement in her edema. Her oxygen saturations have improved as well. I'm going to get a 2-D echocardiogram to further evaluate.      Lorretta Harp MD FACP,FACC,FAHA, [facility] 07/17/2017 12:33 PM

## 2017-07-25 ENCOUNTER — Ambulatory Visit (HOSPITAL_COMMUNITY): Payer: Medicare Other | Attending: Cardiovascular Disease

## 2017-07-25 ENCOUNTER — Other Ambulatory Visit: Payer: Self-pay

## 2017-07-25 DIAGNOSIS — R600 Localized edema: Secondary | ICD-10-CM | POA: Diagnosis present

## 2017-07-25 DIAGNOSIS — I420 Dilated cardiomyopathy: Secondary | ICD-10-CM | POA: Insufficient documentation

## 2017-07-25 DIAGNOSIS — R0602 Shortness of breath: Secondary | ICD-10-CM

## 2017-07-25 DIAGNOSIS — I060 Rheumatic aortic stenosis: Secondary | ICD-10-CM | POA: Diagnosis not present

## 2017-07-29 ENCOUNTER — Telehealth: Payer: Self-pay | Admitting: Cardiovascular Disease

## 2017-07-29 NOTE — Telephone Encounter (Signed)
New message    Patient calling for results of echo. Please call

## 2017-07-29 NOTE — Telephone Encounter (Signed)
Spoke with pt letting her know Dr Gwenlyn Found has not reviewed echo and as soon as he do his primary assistant will give him call

## 2017-08-01 ENCOUNTER — Other Ambulatory Visit: Payer: Self-pay | Admitting: Cardiovascular Disease

## 2017-08-01 DIAGNOSIS — I350 Nonrheumatic aortic (valve) stenosis: Secondary | ICD-10-CM

## 2017-08-02 ENCOUNTER — Ambulatory Visit: Payer: Medicare Other | Admitting: Podiatry

## 2017-08-12 ENCOUNTER — Telehealth: Payer: Self-pay | Admitting: *Deleted

## 2017-08-12 ENCOUNTER — Telehealth: Payer: Self-pay | Admitting: Internal Medicine

## 2017-08-12 DIAGNOSIS — D509 Iron deficiency anemia, unspecified: Secondary | ICD-10-CM

## 2017-08-12 NOTE — Telephone Encounter (Signed)
Left message for pt to call back  °

## 2017-08-12 NOTE — Telephone Encounter (Signed)
Patient returning nurses call

## 2017-08-12 NOTE — Telephone Encounter (Signed)
-----   Message from Larina Bras, [place] sent at 07/30/2017  2:30 PM EST -----   ----- Message ----- From: Larina Bras, CMA Sent: 07/30/2017 To: Larina Bras, CMA  See 05/14/17 telephone note. Pt needs cbc, ibc around 08/09/2017. Look in system first to see if done at [facility]. If not, needs to have done.

## 2017-08-12 NOTE — Telephone Encounter (Signed)
Patient has been reminded that she is due for labwork for Dr Hilarie Fredrickson (these have not been completed at [facility]). Patient will come by to have these done this week or next (she has been made aware of our holiday hours).

## 2017-08-13 NOTE — Telephone Encounter (Signed)
Orders faxed to [facility] [phone].

## 2017-08-13 NOTE — Telephone Encounter (Signed)
Patient calling back regarding this.  °

## 2017-08-30 ENCOUNTER — Encounter (HOSPITAL_COMMUNITY): Payer: Medicare Other | Attending: Oncology

## 2017-08-30 DIAGNOSIS — D72829 Elevated white blood cell count, unspecified: Secondary | ICD-10-CM | POA: Diagnosis present

## 2017-08-30 DIAGNOSIS — D500 Iron deficiency anemia secondary to blood loss (chronic): Secondary | ICD-10-CM | POA: Insufficient documentation

## 2017-08-30 LAB — CBC WITH DIFFERENTIAL/PLATELET
BASOS ABS: 0 10*3/uL (ref 0.0–0.1)
BASOS PCT: 0 %
EOS PCT: 2 %
Eosinophils Absolute: 0.3 10*3/uL (ref 0.0–0.7)
HCT: 50.4 % — ABNORMAL HIGH (ref 36.0–46.0)
Hemoglobin: 14.9 g/dL (ref 12.0–15.0)
LYMPHS PCT: 16 %
Lymphs Abs: 2.2 10*3/uL (ref 0.7–4.0)
MCH: 25 pg — ABNORMAL LOW (ref 26.0–34.0)
MCHC: 29.6 g/dL — ABNORMAL LOW (ref 30.0–36.0)
MCV: 84.4 fL (ref 78.0–100.0)
Monocytes Absolute: 0.8 10*3/uL (ref 0.1–1.0)
Monocytes Relative: 6 %
NEUTROS ABS: 10.3 10*3/uL — AB (ref 1.7–7.7)
Neutrophils Relative %: 76 %
PLATELETS: 295 10*3/uL (ref 150–400)
RBC: 5.97 MIL/uL — AB (ref 3.87–5.11)
RDW: 20.6 % — ABNORMAL HIGH (ref 11.5–15.5)
WBC: 13.7 10*3/uL — AB (ref 4.0–10.5)

## 2017-08-30 LAB — IRON AND TIBC
IRON: 45 ug/dL (ref 28–170)
SATURATION RATIOS: 10 % — AB (ref 10.4–31.8)
TIBC: 454 ug/dL — AB (ref 250–450)
UIBC: 409 ug/dL

## 2017-08-30 LAB — FERRITIN: Ferritin: 17 ng/mL (ref 11–307)

## 2017-09-02 ENCOUNTER — Other Ambulatory Visit (HOSPITAL_COMMUNITY): Payer: Self-pay | Admitting: Adult Health

## 2017-09-13 ENCOUNTER — Encounter (HOSPITAL_COMMUNITY): Payer: Self-pay

## 2017-09-13 ENCOUNTER — Other Ambulatory Visit: Payer: Self-pay

## 2017-09-13 ENCOUNTER — Encounter (HOSPITAL_BASED_OUTPATIENT_CLINIC_OR_DEPARTMENT_OTHER): Payer: Medicare Other

## 2017-09-13 VITALS — BP 116/57 | HR 83 | Temp 98.6°F | Resp 20

## 2017-09-13 DIAGNOSIS — D509 Iron deficiency anemia, unspecified: Secondary | ICD-10-CM

## 2017-09-13 DIAGNOSIS — D508 Other iron deficiency anemias: Secondary | ICD-10-CM

## 2017-09-13 DIAGNOSIS — D500 Iron deficiency anemia secondary to blood loss (chronic): Secondary | ICD-10-CM

## 2017-09-13 MED ORDER — SODIUM CHLORIDE 0.9 % IV SOLN
510.0000 mg | Freq: Once | INTRAVENOUS | Status: AC
  Administered 2017-09-13: 510 mg via INTRAVENOUS
  Filled 2017-09-13: qty 17

## 2017-09-13 MED ORDER — SODIUM CHLORIDE 0.9 % IV SOLN
INTRAVENOUS | Status: DC
  Administered 2017-09-13: 11:00:00 via INTRAVENOUS

## 2017-09-13 NOTE — Progress Notes (Signed)
Treatment given per orders. Patient tolerated it well without problems. Vitals stable and discharged home from clinic ambulatory. Follow up as scheduled.  

## 2017-09-13 NOTE — Patient Instructions (Signed)
[facility] at [facility] Discharge Instructions  RECOMMENDATIONS MADE BY THE CONSULTANT AND ANY TEST RESULTS WILL BE SENT TO YOUR REFERRING PHYSICIAN.  Feraheme given  Follow up as scheduled.  Thank you for choosing [facility] at [facility] to provide your oncology and hematology care.  To afford each patient quality time with our provider, please arrive at least 15 minutes before your scheduled appointment time.    If you have a lab appointment with the Cancer Center please come in thru the  Main Entrance and check in at the main information desk  You need to re-schedule your appointment should you arrive 10 or more minutes late.  We strive to give you quality time with our providers, and arriving late affects you and other patients whose appointments are after yours.  Also, if you no show three or more times for appointments you may be dismissed from the clinic at the providers discretion.     Again, thank you for choosing [facility].  Our hope is that these requests will decrease the amount of time that you wait before being seen by our physicians.       _____________________________________________________________  Should you have questions after your visit to [facility], please contact our office at [phone] between the hours of 8:30 a.m. and 4:30 p.m.  Voicemails left after 4:30 p.m. will not be returned until the following business day.  For prescription refill requests, have your pharmacy contact our office.       Resources For Cancer Patients and their Caregivers ? American Cancer Society: Can assist with transportation, wigs, general needs, runs Look Good Feel Better.        1-888-227-6333 ? Cancer Care: Provides financial assistance, online support groups, medication/co-pay assistance.  1-800-813-HOPE (4673) ? [facility] Assists [place] cancer patients and their  families through emotional , educational and financial support.  336-427-4357 ? [place] DSS Where to apply for food stamps, Medicaid and utility assistance. 336-342-1394 ? [facility]: Transportation to medical appointments. 336-347-2287 ? Social Security Administration: May apply for disability if have a Stage IV cancer. 336-342-7796 1-800-772-1213 ? [place] Aging, Disability and Transit Services: Assists with nutrition, care and transit needs. 336-349-2343  Cancer Center Support Programs: @10RELATIVEDAYS@ > Cancer Support Group  2nd Tuesday of the month 1pm-2pm, Journey Room  > Creative Journey  3rd Tuesday of the month 1130am-1pm, Journey Room  > Look Good Feel Better  1st Wednesday of the month 10am-12 noon, Journey Room (Call American Cancer Society to register 1-800-395-5775)   

## 2017-09-20 ENCOUNTER — Encounter (HOSPITAL_COMMUNITY): Payer: Self-pay

## 2017-09-20 ENCOUNTER — Encounter (HOSPITAL_BASED_OUTPATIENT_CLINIC_OR_DEPARTMENT_OTHER): Payer: Medicare Other

## 2017-09-20 VITALS — BP 112/58 | HR 70 | Temp 98.4°F | Resp 20

## 2017-09-20 DIAGNOSIS — D508 Other iron deficiency anemias: Secondary | ICD-10-CM

## 2017-09-20 DIAGNOSIS — D509 Iron deficiency anemia, unspecified: Secondary | ICD-10-CM | POA: Diagnosis not present

## 2017-09-20 DIAGNOSIS — D500 Iron deficiency anemia secondary to blood loss (chronic): Secondary | ICD-10-CM

## 2017-09-20 MED ORDER — SODIUM CHLORIDE 0.9 % IV SOLN
INTRAVENOUS | Status: DC
  Administered 2017-09-20: 14:00:00 via INTRAVENOUS

## 2017-09-20 MED ORDER — SODIUM CHLORIDE 0.9 % IV SOLN
510.0000 mg | Freq: Once | INTRAVENOUS | Status: AC
  Administered 2017-09-20: 510 mg via INTRAVENOUS
  Filled 2017-09-20: qty 17

## 2017-09-20 NOTE — Patient Instructions (Signed)
[facility] at [facility]  Discharge Instructions:  You received an iron infusion today.  _______________________________________________________________  Thank you for choosing [facility] at [facility] to provide your oncology and hematology care.  To afford each patient quality time with our providers, please arrive at least 15 minutes before your scheduled appointment.  You need to re-schedule your appointment if you arrive 10 or more minutes late.  We strive to give you quality time with our providers, and arriving late affects you and other patients whose appointments are after yours.  Also, if you no show three or more times for appointments you may be dismissed from the clinic.  Again, thank you for choosing [facility] at [facility]. Our hope is that these requests will allow you access to exceptional care and in a timely manner. _______________________________________________________________  If you have questions after your visit, please contact our office at [phone] between the hours of 8:30 a.m. and 5:00 p.m. Voicemails left after 4:30 p.m. will not be returned until the following business day. _______________________________________________________________  For prescription refill requests, have your pharmacy contact our office. _______________________________________________________________  Recommendations made by the consultant and any test results will be sent to your referring physician. _______________________________________________________________ 

## 2017-09-20 NOTE — Progress Notes (Signed)
Patient tolerated iron infusion with no complaints voiced.  Peripheral IV site clean and dry with no pain at site.  No bruising or swelling noted.  Good blood return noted before and after administration of iron.  Band aid applied.  VSS with discharge and left via wheelchair with caregiver.  No s/s of distress noted.

## 2017-10-29 ENCOUNTER — Encounter: Payer: Self-pay | Admitting: Podiatry

## 2017-10-29 ENCOUNTER — Ambulatory Visit: Payer: Medicare Other | Admitting: Podiatry

## 2017-10-29 DIAGNOSIS — M79676 Pain in unspecified toe(s): Secondary | ICD-10-CM | POA: Diagnosis not present

## 2017-10-29 DIAGNOSIS — B351 Tinea unguium: Secondary | ICD-10-CM | POA: Diagnosis not present

## 2017-10-29 DIAGNOSIS — E1140 Type 2 diabetes mellitus with diabetic neuropathy, unspecified: Secondary | ICD-10-CM

## 2017-10-29 NOTE — Progress Notes (Signed)
Patient ID: [name], female   DOB: 1941-01-23, 77 y.o.   MRN: [id number] Complaint:  Visit Type: Patient returns to my office for continued preventative foot care services. Complaint: Patient states" my nails have grown long and thick and become painful to walk and wear shoes" Patient has been diagnosed with DM with no foot complications. The patient presents for preventative foot care services. No changes to ROS  Podiatric Exam: Vascular: dorsalis pedis and posterior tibial pulses are palpable bilateral. Capillary return is immediate. Temperature gradient is WNL. Skin turgor WNL  Sensorium: Diminished  Semmes Weinstein monofilament test. Normal tactile sensation bilaterally. Nail Exam: Pt has thick disfigured discolored nails with subungual debris noted bilateral entire nail hallux through fifth toenails Ulcer Exam: There is no evidence of ulcer or pre-ulcerative changes or infection. Orthopedic Exam: Muscle tone and strength are WNL. No limitations in general ROM. No crepitus or effusions noted. Foot type and digits show no abnormalities. Bony prominences are unremarkable. Skin: No Porokeratosis. No infection or ulcers  Diagnosis:  Onychomycosis, , Pain in right toe, pain in left toes  Treatment & Plan Procedures and Treatment: Consent by patient was obtained for treatment procedures. The patient understood the discussion of treatment and procedures well. All questions were answered thoroughly reviewed. Debridement of mycotic and hypertrophic toenails, 1 through 5 bilateral and clearing of subungual debris. No ulceration, no infection noted.  Return Visit-Office Procedure: Patient instructed to return to the office for a follow up visit 3 months for continued evaluation and treatment.    Gardiner Barefoot DPM

## 2017-10-31 ENCOUNTER — Inpatient Hospital Stay (HOSPITAL_COMMUNITY): Payer: Medicare Other | Attending: Oncology

## 2017-10-31 DIAGNOSIS — D72829 Elevated white blood cell count, unspecified: Secondary | ICD-10-CM

## 2017-10-31 DIAGNOSIS — D500 Iron deficiency anemia secondary to blood loss (chronic): Secondary | ICD-10-CM

## 2017-10-31 DIAGNOSIS — D509 Iron deficiency anemia, unspecified: Secondary | ICD-10-CM | POA: Insufficient documentation

## 2017-10-31 LAB — CBC WITH DIFFERENTIAL/PLATELET
BASOS ABS: 0 10*3/uL (ref 0.0–0.1)
BASOS PCT: 0 %
Eosinophils Absolute: 0.2 10*3/uL (ref 0.0–0.7)
Eosinophils Relative: 2 %
HEMATOCRIT: 52.4 % — AB (ref 36.0–46.0)
Hemoglobin: 16.1 g/dL — ABNORMAL HIGH (ref 12.0–15.0)
Lymphocytes Relative: 15 %
Lymphs Abs: 1.5 10*3/uL (ref 0.7–4.0)
MCH: 26.6 pg (ref 26.0–34.0)
MCHC: 30.7 g/dL (ref 30.0–36.0)
MCV: 86.5 fL (ref 78.0–100.0)
MONO ABS: 0.8 10*3/uL (ref 0.1–1.0)
Monocytes Relative: 8 %
NEUTROS ABS: 7.5 10*3/uL (ref 1.7–7.7)
NEUTROS PCT: 75 %
PLATELETS: 257 10*3/uL (ref 150–400)
RBC: 6.06 MIL/uL — AB (ref 3.87–5.11)
RDW: 19.8 % — AB (ref 11.5–15.5)
WBC: 10.1 10*3/uL (ref 4.0–10.5)

## 2017-10-31 LAB — FERRITIN: Ferritin: 36 ng/mL (ref 11–307)

## 2017-10-31 LAB — IRON AND TIBC
IRON: 43 ug/dL (ref 28–170)
Saturation Ratios: 11 % (ref 10.4–31.8)
TIBC: 400 ug/dL (ref 250–450)
UIBC: 357 ug/dL

## 2017-11-01 ENCOUNTER — Other Ambulatory Visit (HOSPITAL_COMMUNITY): Payer: Self-pay | Admitting: *Deleted

## 2017-11-01 DIAGNOSIS — D473 Essential (hemorrhagic) thrombocythemia: Secondary | ICD-10-CM

## 2017-11-01 DIAGNOSIS — D75839 Thrombocytosis, unspecified: Secondary | ICD-10-CM

## 2017-11-01 NOTE — Progress Notes (Signed)
This encounter was created in error - please disregard.

## 2017-11-15 ENCOUNTER — Inpatient Hospital Stay (HOSPITAL_COMMUNITY): Payer: Medicare Other

## 2017-11-15 DIAGNOSIS — D509 Iron deficiency anemia, unspecified: Secondary | ICD-10-CM | POA: Diagnosis not present

## 2017-11-15 DIAGNOSIS — D473 Essential (hemorrhagic) thrombocythemia: Secondary | ICD-10-CM

## 2017-11-15 DIAGNOSIS — D75839 Thrombocytosis, unspecified: Secondary | ICD-10-CM

## 2017-11-15 LAB — CBC WITH DIFFERENTIAL/PLATELET
Basophils Absolute: 0 10*3/uL (ref 0.0–0.1)
Basophils Relative: 0 %
EOS ABS: 0.3 10*3/uL (ref 0.0–0.7)
EOS PCT: 2 %
HCT: 49.9 % — ABNORMAL HIGH (ref 36.0–46.0)
Hemoglobin: 14.9 g/dL (ref 12.0–15.0)
LYMPHS ABS: 2 10*3/uL (ref 0.7–4.0)
Lymphocytes Relative: 16 %
MCH: 26.1 pg (ref 26.0–34.0)
MCHC: 29.9 g/dL — ABNORMAL LOW (ref 30.0–36.0)
MCV: 87.4 fL (ref 78.0–100.0)
Monocytes Absolute: 1 10*3/uL (ref 0.1–1.0)
Monocytes Relative: 8 %
Neutro Abs: 9.2 10*3/uL — ABNORMAL HIGH (ref 1.7–7.7)
Neutrophils Relative %: 74 %
PLATELETS: 271 10*3/uL (ref 150–400)
RBC: 5.71 MIL/uL — AB (ref 3.87–5.11)
RDW: 18.5 % — AB (ref 11.5–15.5)
WBC: 12.5 10*3/uL — AB (ref 4.0–10.5)

## 2017-12-17 ENCOUNTER — Telehealth: Payer: Self-pay | Admitting: *Deleted

## 2017-12-17 NOTE — Telephone Encounter (Signed)
Noted  

## 2017-12-17 NOTE — Telephone Encounter (Signed)
Please see 05/08/17 and 05/14/17 notes. Still appropriate for cologuard testing?

## 2017-12-17 NOTE — Telephone Encounter (Signed)
Would not pursue repeat Cologuard at this time Defer to PCP, as I have not seen her recently.  She also has multiple medical comorbidities. She has chronic IDA and is receiving IV iron when needed and her anemia has resolved.  Most recent iron counts normal

## 2017-12-17 NOTE — Telephone Encounter (Signed)
-----   Message from Larina Bras, [place] sent at 05/22/2017  3:04 PM EDT ----- See 05/14/17 telephone note... Pt needs cologard 12/2017

## 2017-12-31 ENCOUNTER — Inpatient Hospital Stay (HOSPITAL_BASED_OUTPATIENT_CLINIC_OR_DEPARTMENT_OTHER): Payer: Medicare Other | Admitting: Internal Medicine

## 2017-12-31 ENCOUNTER — Other Ambulatory Visit (HOSPITAL_COMMUNITY): Payer: Medicare Other

## 2017-12-31 ENCOUNTER — Other Ambulatory Visit: Payer: Self-pay

## 2017-12-31 ENCOUNTER — Encounter (HOSPITAL_COMMUNITY): Payer: Self-pay | Admitting: Internal Medicine

## 2017-12-31 ENCOUNTER — Ambulatory Visit (HOSPITAL_COMMUNITY): Payer: Medicare Other | Admitting: Hematology

## 2017-12-31 ENCOUNTER — Inpatient Hospital Stay (HOSPITAL_COMMUNITY): Payer: Medicare Other | Attending: Adult Health

## 2017-12-31 ENCOUNTER — Ambulatory Visit (HOSPITAL_COMMUNITY): Payer: Medicare Other

## 2017-12-31 VITALS — BP 127/60 | HR 67 | Temp 98.1°F | Resp 22

## 2017-12-31 DIAGNOSIS — Z9049 Acquired absence of other specified parts of digestive tract: Secondary | ICD-10-CM | POA: Insufficient documentation

## 2017-12-31 DIAGNOSIS — Z87891 Personal history of nicotine dependence: Secondary | ICD-10-CM | POA: Diagnosis not present

## 2017-12-31 DIAGNOSIS — Z8614 Personal history of Methicillin resistant Staphylococcus aureus infection: Secondary | ICD-10-CM | POA: Insufficient documentation

## 2017-12-31 DIAGNOSIS — D72829 Elevated white blood cell count, unspecified: Secondary | ICD-10-CM | POA: Diagnosis not present

## 2017-12-31 DIAGNOSIS — D709 Neutropenia, unspecified: Secondary | ICD-10-CM | POA: Insufficient documentation

## 2017-12-31 DIAGNOSIS — Z8719 Personal history of other diseases of the digestive system: Secondary | ICD-10-CM

## 2017-12-31 DIAGNOSIS — R079 Chest pain, unspecified: Secondary | ICD-10-CM | POA: Diagnosis not present

## 2017-12-31 DIAGNOSIS — E119 Type 2 diabetes mellitus without complications: Secondary | ICD-10-CM | POA: Diagnosis not present

## 2017-12-31 DIAGNOSIS — I10 Essential (primary) hypertension: Secondary | ICD-10-CM

## 2017-12-31 DIAGNOSIS — E669 Obesity, unspecified: Secondary | ICD-10-CM

## 2017-12-31 DIAGNOSIS — Z7982 Long term (current) use of aspirin: Secondary | ICD-10-CM | POA: Diagnosis not present

## 2017-12-31 DIAGNOSIS — D500 Iron deficiency anemia secondary to blood loss (chronic): Secondary | ICD-10-CM | POA: Diagnosis present

## 2017-12-31 DIAGNOSIS — Z79899 Other long term (current) drug therapy: Secondary | ICD-10-CM | POA: Diagnosis not present

## 2017-12-31 DIAGNOSIS — J449 Chronic obstructive pulmonary disease, unspecified: Secondary | ICD-10-CM | POA: Insufficient documentation

## 2017-12-31 DIAGNOSIS — R0602 Shortness of breath: Secondary | ICD-10-CM

## 2017-12-31 DIAGNOSIS — G4730 Sleep apnea, unspecified: Secondary | ICD-10-CM

## 2017-12-31 DIAGNOSIS — R600 Localized edema: Secondary | ICD-10-CM | POA: Diagnosis not present

## 2017-12-31 LAB — CBC WITH DIFFERENTIAL/PLATELET
BASOS ABS: 0.1 10*3/uL (ref 0.0–0.1)
BASOS PCT: 0 %
EOS PCT: 3 %
Eosinophils Absolute: 0.4 10*3/uL (ref 0.0–0.7)
HCT: 49.8 % — ABNORMAL HIGH (ref 36.0–46.0)
Hemoglobin: 14.9 g/dL (ref 12.0–15.0)
Lymphocytes Relative: 15 %
Lymphs Abs: 1.8 10*3/uL (ref 0.7–4.0)
MCH: 25.3 pg — ABNORMAL LOW (ref 26.0–34.0)
MCHC: 29.9 g/dL — AB (ref 30.0–36.0)
MCV: 84.7 fL (ref 78.0–100.0)
MONO ABS: 0.8 10*3/uL (ref 0.1–1.0)
MONOS PCT: 7 %
NEUTROS ABS: 9.6 10*3/uL — AB (ref 1.7–7.7)
Neutrophils Relative %: 75 %
PLATELETS: 327 10*3/uL (ref 150–400)
RBC: 5.88 MIL/uL — ABNORMAL HIGH (ref 3.87–5.11)
RDW: 16.8 % — AB (ref 11.5–15.5)
WBC: 12.7 10*3/uL — ABNORMAL HIGH (ref 4.0–10.5)

## 2017-12-31 LAB — IRON AND TIBC
Iron: 37 ug/dL (ref 28–170)
Saturation Ratios: 9 % — ABNORMAL LOW (ref 10.4–31.8)
TIBC: 434 ug/dL (ref 250–450)
UIBC: 397 ug/dL

## 2017-12-31 LAB — FERRITIN: Ferritin: 20 ng/mL (ref 11–307)

## 2017-12-31 NOTE — Progress Notes (Signed)
Diagnosis Iron deficiency anemia due to chronic blood loss - Plan: CBC with Differential/Platelet, Comprehensive metabolic panel, Lactate dehydrogenase, Ferritin  Staging Cancer Staging No matching staging information was found for the patient.  Assessment and Plan:  1.  Iron deficiency anemia: Pt was previously followed by Dr. Talbert Cage.  Condition was thought to be in setting of chronic GI blood loss. Last IV iron infusion 08/2017.    Labs done today 12/31/2017 show a white count 12.7 hemoglobin 14.9 platelets 327,000.  Ferritin is pending.  I discussed with her today that her hemoglobin remains stable.  Unless iron levels have decreased significantly she will return to clinic for repeat labs in 2 months with CBC and ferritin.  She will be seen for follow-up in 6 months with CBC CMP and ferritin.  She should notify the office if she has any problems prior to her next visit.    2.  Leukocytosis/Neutrophilia.  Previous evaluation with flow cytometry in 02/2014 negative; BCR-ABL negative. Felt unlikely malignant/myeloproliferative etiology.  Patient had a chronic umbilical wound that she reports is healing.  White blood count is stable at 12.7 hemoglobin 14.5 platelets 327,000.  We will repeat labs on return to clinic in 2 months.  3.  Hypertension.  Blood pressure is 127/60.  Follow-up with PCP.  Current Status: Patient is seen today for follow-up.  She is here to go over labs.   Problem List Patient Active Problem List   Diagnosis Date Noted  . Bilateral lower extremity edema [R60.0] 07/17/2017  . 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 IDA (iron deficiency anemia) [D50.9]   . Leukocytosis [D72.829] 11/06/2014  . COPD exacerbation ([place]) [J44.1] 06/05/2014  . Hypoxia [R09.02] 06/05/2014  . Iron deficiency anemia due to chronic blood loss [D50.0] 04/12/2014  . Postoperative MRSA infection of lower abdominal wound [T81.49XA, B95.62] 03/22/2014   . Thrombocytosis ([place]) [D47.3] 03/20/2014  . Incisional hernia without mention of obstruction or gangrene [K43.2] 05/13/2013  . Cellulitis [L03.90] 03/14/2013  . Draining postoperative wound [T81.89XA] 01/27/2013  . S/P colon resection [Z90.49] 06/27/2012  . Obesity, Class III, BMI 40-49.9 (morbid obesity) ([place]) [E66.01] 10/18/2011  . 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 Hypertension [I10] 10/09/2011  . Diabetes mellitus ([place]) [E11.9] 10/09/2011  . OBSTRUCTIVE SLEEP APNEA [G47.33] 03/10/2008    Past Medical History Past Medical History:  Diagnosis Date  . Anemia   . Arthritis   . Bronchitis 05/2014  . Cellulitis of lower extremity 06/2014   bilateral  . COPD (chronic obstructive pulmonary disease) ([place])   . Diabetes mellitus   . Diverticulitis   . Gallstone   . Hypertension   . MRSA (methicillin resistant Staphylococcus aureus)    has now "tested negative"- no open wounds as of 02-10-15  . Obesity   . Shortness of breath    with exertion   . Sleep apnea    cpap at 10   . Sleep apnea   . Thyroid disease   . Tubular adenoma of colon     Past Surgical History Past Surgical History:  Procedure Laterality Date  . ABDOMINAL HYSTERECTOMY  1979  . ABDOMINAL HYSTERECTOMY  1978  . APPENDECTOMY    . APPLICATION OF WOUND VAC  wound vac  . APPLICATION OF WOUND VAC  06/26/2012   Procedure: APPLICATION OF WOUND VAC;  Surgeon: Gayland Curry, MD,FACS;  Location: [facility];  Service: General;;  . BACK SURGERY    .  COLON RESECTION  10/09/2011   Procedure: COLON RESECTION;  Surgeon: Gayland Curry, MD;  Location: [facility];  Service: General;  Laterality: N/A;  Colon Resection with colostomy  . COLON RESECTION  06/26/2012   Procedure: COLON RESECTION;  Surgeon: Gayland Curry, MD,FACS;  Location: [facility];  Service: General;  Laterality: N/A;  . ESOPHAGOGASTRODUODENOSCOPY (EGD) WITH PROPOFOL N/A 02/17/2015   Procedure:  ESOPHAGOGASTRODUODENOSCOPY (EGD) WITH PROPOFOL;  Surgeon: Jerene Bears, MD;  Location: [facility];  Service: Gastroenterology;  Laterality: N/A;  . jackson pratt    . PROCTOSCOPY  06/26/2012   Procedure: PROCTOSCOPY;  Surgeon: Gayland Curry, MD,FACS;  Location: [facility];  Service: General;  Laterality: N/A;  Rigid Proctoscopy    Family History Family History  Problem Relation Age of Onset  . Heart disease Father   . Diabetes Father   . Hypertension Father   . Diabetes Mother   . Hypertension Mother   . Heart disease Brother   . Diabetes Brother   . Diabetes Sister   . Colon cancer Neg Hx   . Colon polyps Neg Hx   . Esophageal cancer Neg Hx   . Kidney disease Neg Hx   . Gallbladder disease Neg Hx      Social History  reports that she quit smoking about 7 years ago. She has never used smokeless tobacco. She reports that she does not drink alcohol or use drugs.  Medications  Current Outpatient Medications:  .  aspirin 81 MG tablet, Take 162 mg by mouth daily. Take 2 tablets, by mouth, every day, Disp: , Rfl:  .  budesonide-formoterol (SYMBICORT) 160-4.5 MCG/ACT inhaler, Inhale 2 puffs into the lungs 2 (two) times daily., Disp: , Rfl:  .  Calcium Carb-Cholecalciferol (CALCIUM 600 + D PO), Take 2 tablets by mouth daily., Disp: , Rfl:  .  calcium carbonate (OSCAL) 1500 (600 Ca) MG TABS tablet, Take by mouth 2 (two) times daily with a meal., Disp: , Rfl:  .  clobetasol (TEMOVATE) 0.05 % GEL, Apply topically 2 (two) times daily., Disp: , Rfl:  .  furosemide (LASIX) 80 MG tablet, , Disp: , Rfl:  .  Glucosamine-Chondroitin-Vit D3 1500-1200-800 MG-MG-UNIT PACK, Take 2 tablets by mouth daily., Disp: , Rfl:  .  guaiFENesin (MUCINEX) 600 MG 12 hr tablet, Take by mouth 3 (three) times daily., Disp: , Rfl:  .  JANUVIA 100 MG tablet, Take 100 mg by mouth daily. , Disp: , Rfl:  .  levothyroxine (SYNTHROID, LEVOTHROID) 50 MCG tablet, Take 50 mcg by mouth daily., Disp: , Rfl:  .  meloxicam (MOBIC)  7.5 MG tablet, Take 7.5 mg by mouth daily. , Disp: , Rfl:  .  metFORMIN (GLUCOPHAGE) 500 MG tablet, Take 1,000 mg by mouth 2 (two) times daily with a meal. , Disp: , Rfl:  .  montelukast (SINGULAIR) 10 MG tablet, Take 10 mg by mouth daily. , Disp: , Rfl:  .  nystatin (MYCOSTATIN) 100000 UNIT/ML suspension, Take by mouth. Swish and spit, Disp: , Rfl:  .  nystatin (MYCOSTATIN) powder, Apply topically as needed., Disp: , Rfl:  .  nystatin cream (MYCOSTATIN), Apply topically as needed., Disp: , Rfl:  .  OXYGEN, Inhale into the lungs at bedtime., Disp: , Rfl:  .  polyethylene glycol (MIRALAX / GLYCOLAX) packet, Take 17 g by mouth daily., Disp: , Rfl:  .  traMADol (ULTRAM) 50 MG tablet, Take 50 mg by mouth at bedtime as needed for moderate pain. , Disp: , Rfl:  .  Wheat Dextrin (BENEFIBER) POWD, Take by mouth as directed., Disp: , Rfl:  No current facility-administered medications for this visit.   Facility-Administered Medications Ordered in Other Visits:  .  0.9 %  sodium chloride infusion, , Intravenous, Continuous, [place], Kelby Fam, MD, Stopped at 07/23/16 1512  Allergies Adhesive [tape]; Codeine; Vancomycin; and Zosyn [piperacillin sod-tazobactam so]  Review of Systems Review of Systems - Oncology ROS as per HPI otherwise 12 point ROS is negative.   Physical Exam  Vitals Wt Readings from Last 3 Encounters:  01/03/17 (!) 315 lb (142.9 kg)  07/05/16 (!) 315 lb (142.9 kg)  04/22/15 (!) 313 lb (142 kg)   Temp Readings from Last 3 Encounters:  12/31/17 98.1 F (36.7 C) (Oral)  09/20/17 98.4 F (36.9 C) (Oral)  09/13/17 98.6 F (37 C)   BP Readings from Last 3 Encounters:  12/31/17 127/60  09/20/17 (!) 112/58  09/13/17 (!) 116/57   Pulse Readings from Last 3 Encounters:  12/31/17 67  09/20/17 70  09/13/17 83   Constitutional: Well-developed, well-nourished, and in no distress.   HENT: Head: Normocephalic and atraumatic.  Mouth/Throat: No oropharyngeal exudate. Mucosa  moist. Eyes: Pupils are equal, round, and reactive to light. Conjunctivae are normal. No scleral icterus.  Neck: Normal range of motion. Neck supple. No JVD present.  Cardiovascular: Normal rate, regular rhythm and normal heart sounds.  Exam reveals no gallop and no friction rub.   No murmur heard. Pulmonary/Chest: Effort normal and breath sounds normal. No respiratory distress. No wheezes.No rales.  Abdominal: Soft. Bowel sounds are normal. No distension. There is no tenderness. There is no guarding. Healed Incision site noted on abdomen.   Musculoskeletal: No edema or tenderness.  Lymphadenopathy: No cervical, axillary or supraclavicular adenopathy.  Neurological: Alert and oriented to person, place, and time. No cranial nerve deficit.  Skin: Skin is warm and dry. No rash noted. No erythema. No pallor.  Psychiatric: Affect and judgment normal.   Labs Appointment on 12/31/2017  Component Date Value Ref Range Status  . WBC 12/31/2017 12.7* 4.0 - 10.5 K/uL Final  . RBC 12/31/2017 5.88* 3.87 - 5.11 MIL/uL Final  . Hemoglobin 12/31/2017 14.9  12.0 - 15.0 g/dL Final  . HCT 12/31/2017 49.8* 36.0 - 46.0 % Final  . MCV 12/31/2017 84.7  78.0 - 100.0 fL Final  . MCH 12/31/2017 25.3* 26.0 - 34.0 pg Final  . MCHC 12/31/2017 29.9* 30.0 - 36.0 g/dL Final  . RDW 12/31/2017 16.8* 11.5 - 15.5 % Final  . Platelets 12/31/2017 327  150 - 400 K/uL Final  . Neutrophils Relative % 12/31/2017 75  % Final  . Neutro Abs 12/31/2017 9.6* 1.7 - 7.7 K/uL Final  . Lymphocytes Relative 12/31/2017 15  % Final  . Lymphs Abs 12/31/2017 1.8  0.7 - 4.0 K/uL Final  . Monocytes Relative 12/31/2017 7  % Final  . Monocytes Absolute 12/31/2017 0.8  0.1 - 1.0 K/uL Final  . Eosinophils Relative 12/31/2017 3  % Final  . Eosinophils Absolute 12/31/2017 0.4  0.0 - 0.7 K/uL Final  . Basophils Relative 12/31/2017 0  % Final  . Basophils Absolute 12/31/2017 0.1  0.0 - 0.1 K/uL Final   Performed at [facility], [address], [place], [place] [address]     Pathology Orders Placed This Encounter  Procedures  . CBC with Differential/Platelet    Standing Status:   Future    Standing Expiration Date:   01/01/2019  . Comprehensive metabolic panel  Standing Status:   Future    Standing Expiration Date:   01/01/2019  . Lactate dehydrogenase    Standing Status:   Future    Standing Expiration Date:   01/01/2019  . Ferritin    Standing Status:   Future    Standing Expiration Date:   01/01/2019       Zoila Shutter MD

## 2018-01-08 ENCOUNTER — Telehealth (HOSPITAL_COMMUNITY): Payer: Self-pay

## 2018-01-08 NOTE — Telephone Encounter (Signed)
Patient called for results of labs performed on 12/31/17. She wants to know if she needs Iron. Explained to patient I would review with the NP and call her back tomorrow (01/09/18) with an answer. Patient verbalized understanding.

## 2018-01-08 NOTE — Telephone Encounter (Signed)
Dr. Walden Field saw the patient, so I forwarded those lab results to her for review via InBasket.  I will defer to Dr. Wenda Low judgment if patient requires additional iron, as I am not familiar with her treatment parameters.   Mike Craze, NP [place] [phone]

## 2018-01-14 NOTE — Telephone Encounter (Signed)
Dr. Walden Field reviewed labs today and said patient did not need Iron at this time. Notified patient who verbalized understanding.

## 2018-01-17 ENCOUNTER — Encounter (HOSPITAL_COMMUNITY): Payer: Self-pay

## 2018-01-17 ENCOUNTER — Other Ambulatory Visit: Payer: Self-pay

## 2018-01-17 ENCOUNTER — Emergency Department (HOSPITAL_COMMUNITY): Payer: Medicare Other

## 2018-01-17 ENCOUNTER — Inpatient Hospital Stay (HOSPITAL_COMMUNITY)
Admission: EM | Admit: 2018-01-17 | Discharge: 2018-01-23 | DRG: 291 | Disposition: A | Discharge status: Home Health | Payer: Medicare Other | Attending: Family Medicine | Admitting: Family Medicine

## 2018-01-17 DIAGNOSIS — R600 Localized edema: Secondary | ICD-10-CM | POA: Diagnosis not present

## 2018-01-17 DIAGNOSIS — R0602 Shortness of breath: Secondary | ICD-10-CM | POA: Diagnosis not present

## 2018-01-17 DIAGNOSIS — I130 Hypertensive heart and chronic kidney disease with heart failure and stage 1 through stage 4 chronic kidney disease, or unspecified chronic kidney disease: Principal | ICD-10-CM | POA: Diagnosis present

## 2018-01-17 DIAGNOSIS — Z79890 Hormone replacement therapy (postmenopausal): Secondary | ICD-10-CM

## 2018-01-17 DIAGNOSIS — Z87891 Personal history of nicotine dependence: Secondary | ICD-10-CM | POA: Diagnosis not present

## 2018-01-17 DIAGNOSIS — E118 Type 2 diabetes mellitus with unspecified complications: Secondary | ICD-10-CM | POA: Diagnosis not present

## 2018-01-17 DIAGNOSIS — Z9049 Acquired absence of other specified parts of digestive tract: Secondary | ICD-10-CM | POA: Diagnosis not present

## 2018-01-17 DIAGNOSIS — Z881 Allergy status to other antibiotic agents status: Secondary | ICD-10-CM

## 2018-01-17 DIAGNOSIS — D631 Anemia in chronic kidney disease: Secondary | ICD-10-CM | POA: Diagnosis present

## 2018-01-17 DIAGNOSIS — E119 Type 2 diabetes mellitus without complications: Secondary | ICD-10-CM

## 2018-01-17 DIAGNOSIS — M1990 Unspecified osteoarthritis, unspecified site: Secondary | ICD-10-CM | POA: Diagnosis present

## 2018-01-17 DIAGNOSIS — I5033 Acute on chronic diastolic (congestive) heart failure: Secondary | ICD-10-CM | POA: Diagnosis present

## 2018-01-17 DIAGNOSIS — Z6841 Body Mass Index (BMI) 40.0 and over, adult: Secondary | ICD-10-CM | POA: Diagnosis not present

## 2018-01-17 DIAGNOSIS — Z8249 Family history of ischemic heart disease and other diseases of the circulatory system: Secondary | ICD-10-CM

## 2018-01-17 DIAGNOSIS — N183 Chronic kidney disease, stage 3 (moderate): Secondary | ICD-10-CM | POA: Diagnosis present

## 2018-01-17 DIAGNOSIS — L239 Allergic contact dermatitis, unspecified cause: Secondary | ICD-10-CM | POA: Diagnosis not present

## 2018-01-17 DIAGNOSIS — Z90710 Acquired absence of both cervix and uterus: Secondary | ICD-10-CM

## 2018-01-17 DIAGNOSIS — Z9981 Dependence on supplemental oxygen: Secondary | ICD-10-CM

## 2018-01-17 DIAGNOSIS — E876 Hypokalemia: Secondary | ICD-10-CM | POA: Diagnosis not present

## 2018-01-17 DIAGNOSIS — Z885 Allergy status to narcotic agent status: Secondary | ICD-10-CM | POA: Diagnosis not present

## 2018-01-17 DIAGNOSIS — Z833 Family history of diabetes mellitus: Secondary | ICD-10-CM

## 2018-01-17 DIAGNOSIS — N289 Disorder of kidney and ureter, unspecified: Secondary | ICD-10-CM | POA: Diagnosis not present

## 2018-01-17 DIAGNOSIS — J449 Chronic obstructive pulmonary disease, unspecified: Secondary | ICD-10-CM | POA: Diagnosis present

## 2018-01-17 DIAGNOSIS — L03116 Cellulitis of left lower limb: Secondary | ICD-10-CM | POA: Diagnosis present

## 2018-01-17 DIAGNOSIS — Z7984 Long term (current) use of oral hypoglycemic drugs: Secondary | ICD-10-CM

## 2018-01-17 DIAGNOSIS — Z791 Long term (current) use of non-steroidal anti-inflammatories (NSAID): Secondary | ICD-10-CM | POA: Diagnosis not present

## 2018-01-17 DIAGNOSIS — J9621 Acute and chronic respiratory failure with hypoxia: Secondary | ICD-10-CM | POA: Diagnosis present

## 2018-01-17 DIAGNOSIS — E039 Hypothyroidism, unspecified: Secondary | ICD-10-CM | POA: Diagnosis present

## 2018-01-17 DIAGNOSIS — T45515A Adverse effect of anticoagulants, initial encounter: Secondary | ICD-10-CM | POA: Diagnosis not present

## 2018-01-17 DIAGNOSIS — Z7982 Long term (current) use of aspirin: Secondary | ICD-10-CM

## 2018-01-17 DIAGNOSIS — E1122 Type 2 diabetes mellitus with diabetic chronic kidney disease: Secondary | ICD-10-CM | POA: Diagnosis present

## 2018-01-17 DIAGNOSIS — L03115 Cellulitis of right lower limb: Secondary | ICD-10-CM | POA: Diagnosis present

## 2018-01-17 DIAGNOSIS — G4733 Obstructive sleep apnea (adult) (pediatric): Secondary | ICD-10-CM | POA: Diagnosis present

## 2018-01-17 DIAGNOSIS — I509 Heart failure, unspecified: Secondary | ICD-10-CM | POA: Diagnosis not present

## 2018-01-17 DIAGNOSIS — Z91048 Other nonmedicinal substance allergy status: Secondary | ICD-10-CM

## 2018-01-17 LAB — I-STAT VENOUS BLOOD GAS, ED
Acid-Base Excess: 9 mmol/L — ABNORMAL HIGH (ref 0.0–2.0)
BICARBONATE: 37.7 mmol/L — AB (ref 20.0–28.0)
O2 SAT: 31 %
TCO2: 40 mmol/L — AB (ref 22–32)
pCO2, Ven: 66.2 mmHg — ABNORMAL HIGH (ref 44.0–60.0)
pH, Ven: 7.364 (ref 7.250–7.430)
pO2, Ven: 21 mmHg — CL (ref 32.0–45.0)

## 2018-01-17 LAB — I-STAT TROPONIN, ED: Troponin i, poc: 0 ng/mL (ref 0.00–0.08)

## 2018-01-17 LAB — CREATININE, URINE, RANDOM: Creatinine, Urine: 40.62 mg/dL

## 2018-01-17 LAB — CBC WITH DIFFERENTIAL/PLATELET
BASOS PCT: 0 %
Basophils Absolute: 0.1 10*3/uL (ref 0.0–0.1)
EOS ABS: 0.4 10*3/uL (ref 0.0–0.7)
EOS PCT: 3 %
HCT: 48.1 % — ABNORMAL HIGH (ref 36.0–46.0)
HEMOGLOBIN: 14.6 g/dL (ref 12.0–15.0)
LYMPHS ABS: 2 10*3/uL (ref 0.7–4.0)
Lymphocytes Relative: 16 %
MCH: 25.3 pg — AB (ref 26.0–34.0)
MCHC: 30.4 g/dL (ref 30.0–36.0)
MCV: 83.4 fL (ref 78.0–100.0)
MONO ABS: 1 10*3/uL (ref 0.1–1.0)
MONOS PCT: 8 %
NEUTROS PCT: 73 %
Neutro Abs: 9.3 10*3/uL — ABNORMAL HIGH (ref 1.7–7.7)
Platelets: 346 10*3/uL (ref 150–400)
RBC: 5.77 MIL/uL — ABNORMAL HIGH (ref 3.87–5.11)
RDW: 16.2 % — AB (ref 11.5–15.5)
WBC: 12.7 10*3/uL — ABNORMAL HIGH (ref 4.0–10.5)

## 2018-01-17 LAB — BASIC METABOLIC PANEL
Anion gap: 15 (ref 5–15)
BUN: 18 mg/dL (ref 6–20)
CO2: 29 mmol/L (ref 22–32)
CREATININE: 1.12 mg/dL — AB (ref 0.44–1.00)
Calcium: 9.4 mg/dL (ref 8.9–10.3)
Chloride: 94 mmol/L — ABNORMAL LOW (ref 101–111)
GFR calc Af Amer: 53 mL/min — ABNORMAL LOW (ref >60)
GFR calc non Af Amer: 46 mL/min — ABNORMAL LOW (ref >60)
GLUCOSE: 110 mg/dL — AB (ref 65–99)
Potassium: 3.7 mmol/L (ref 3.5–5.1)
Sodium: 138 mmol/L (ref 135–145)

## 2018-01-17 LAB — SODIUM, URINE, RANDOM: Sodium, Ur: 83 mmol/L

## 2018-01-17 LAB — BRAIN NATRIURETIC PEPTIDE: B Natriuretic Peptide: 192.6 pg/mL — ABNORMAL HIGH (ref 0.0–100.0)

## 2018-01-17 MED ORDER — MONTELUKAST SODIUM 10 MG PO TABS
10.0000 mg | ORAL_TABLET | Freq: Every day | ORAL | Status: DC
Start: 2018-01-18 — End: 2018-01-23
  Administered 2018-01-18 – 2018-01-23 (×6): 10 mg via ORAL
  Filled 2018-01-17 (×6): qty 1

## 2018-01-17 MED ORDER — GUAIFENESIN ER 600 MG PO TB12
1200.0000 mg | ORAL_TABLET | Freq: Two times a day (BID) | ORAL | Status: DC | PRN
  Administered 2018-01-22: 1200 mg via ORAL
  Filled 2018-01-17: qty 2

## 2018-01-17 MED ORDER — SODIUM CHLORIDE 0.9% FLUSH: 3.0000 mL | INTRAVENOUS | Status: DC | PRN

## 2018-01-17 MED ORDER — CALCIUM CARBONATE 1250 (500 CA) MG PO TABS
1250.0000 mg | ORAL_TABLET | Freq: Two times a day (BID) | ORAL | Status: DC
  Administered 2018-01-18 – 2018-01-23 (×11): 1250 mg via ORAL
  Filled 2018-01-17 (×11): qty 1

## 2018-01-17 MED ORDER — ALPRAZOLAM 0.25 MG PO TABS: 0.2500 mg | ORAL_TABLET | Freq: Two times a day (BID) | ORAL | Status: DC | PRN

## 2018-01-17 MED ORDER — SODIUM CHLORIDE 0.9 % IV SOLN: 250.0000 mL | INTRAVENOUS | Status: DC | PRN

## 2018-01-17 MED ORDER — TRAMADOL HCL 50 MG PO TABS
50.0000 mg | ORAL_TABLET | Freq: Four times a day (QID) | ORAL | Status: DC | PRN
  Administered 2018-01-18: 50 mg via ORAL
  Administered 2018-01-18: 100 mg via ORAL
  Administered 2018-01-18 – 2018-01-20 (×3): 50 mg via ORAL
  Administered 2018-01-20 – 2018-01-23 (×6): 100 mg via ORAL
  Filled 2018-01-17: qty 2
  Filled 2018-01-17 (×3): qty 1
  Filled 2018-01-17 (×2): qty 2
  Filled 2018-01-17: qty 1
  Filled 2018-01-17 (×3): qty 2
  Filled 2018-01-17: qty 1
  Filled 2018-01-17: qty 2

## 2018-01-17 MED ORDER — LEVOTHYROXINE SODIUM 50 MCG PO TABS
50.0000 ug | ORAL_TABLET | Freq: Every day | ORAL | Status: DC
  Administered 2018-01-18 – 2018-01-23 (×6): 50 ug via ORAL
  Filled 2018-01-17 (×6): qty 1

## 2018-01-17 MED ORDER — ONDANSETRON HCL 4 MG/2ML IJ SOLN: 4.0000 mg | Freq: Four times a day (QID) | INTRAMUSCULAR | Status: DC | PRN

## 2018-01-17 MED ORDER — ASPIRIN EC 81 MG PO TBEC
162.0000 mg | DELAYED_RELEASE_TABLET | Freq: Every day | ORAL | Status: DC
  Administered 2018-01-20 – 2018-01-23 (×4): 162 mg via ORAL
  Filled 2018-01-17 (×5): qty 2

## 2018-01-17 MED ORDER — FUROSEMIDE 10 MG/ML IJ SOLN
40.0000 mg | Freq: Once | INTRAMUSCULAR | Status: AC
  Administered 2018-01-17: 40 mg via INTRAVENOUS
  Filled 2018-01-17: qty 4

## 2018-01-17 MED ORDER — FUROSEMIDE 10 MG/ML IJ SOLN
40.0000 mg | Freq: Two times a day (BID) | INTRAMUSCULAR | Status: DC
  Administered 2018-01-18: 40 mg via INTRAVENOUS
  Filled 2018-01-17: qty 4

## 2018-01-17 MED ORDER — MONTELUKAST SODIUM 10 MG PO TABS: 10.0000 mg | ORAL_TABLET | Freq: Every day | ORAL | Status: DC

## 2018-01-17 MED ORDER — FUROSEMIDE 10 MG/ML IJ SOLN
40.0000 mg | Freq: Once | INTRAMUSCULAR | Status: AC
  Administered 2018-01-17: 40 mg via INTRAVENOUS

## 2018-01-17 MED ORDER — IPRATROPIUM-ALBUTEROL 0.5-2.5 (3) MG/3ML IN SOLN
3.0000 mL | Freq: Once | RESPIRATORY_TRACT | Status: AC
  Administered 2018-01-17: 3 mL via RESPIRATORY_TRACT
  Filled 2018-01-17: qty 3

## 2018-01-17 MED ORDER — POLYETHYLENE GLYCOL 3350 17 G PO PACK
17.0000 g | PACK | Freq: Every day | ORAL | Status: DC
  Filled 2018-01-17: qty 1

## 2018-01-17 MED ORDER — ALBUTEROL SULFATE (2.5 MG/3ML) 0.083% IN NEBU
2.5000 mg | INHALATION_SOLUTION | Freq: Four times a day (QID) | RESPIRATORY_TRACT | Status: DC | PRN
  Administered 2018-01-18 – 2018-01-23 (×6): 2.5 mg via RESPIRATORY_TRACT
  Filled 2018-01-17 (×5): qty 3

## 2018-01-17 MED ORDER — ACETAMINOPHEN 325 MG PO TABS
650.0000 mg | ORAL_TABLET | ORAL | Status: DC | PRN
  Administered 2018-01-20 – 2018-01-22 (×2): 650 mg via ORAL
  Filled 2018-01-17 (×2): qty 2

## 2018-01-17 MED ORDER — ZOLPIDEM TARTRATE 5 MG PO TABS: 5.0000 mg | ORAL_TABLET | Freq: Every evening | ORAL | Status: DC | PRN

## 2018-01-17 MED ORDER — SODIUM CHLORIDE 0.9% FLUSH
3.0000 mL | Freq: Two times a day (BID) | INTRAVENOUS | Status: DC
  Administered 2018-01-17 – 2018-01-22 (×9): 3 mL via INTRAVENOUS

## 2018-01-17 MED ORDER — FUROSEMIDE 10 MG/ML IJ SOLN: 60.0000 mg | Freq: Two times a day (BID) | INTRAMUSCULAR | Status: DC

## 2018-01-17 MED ORDER — FUROSEMIDE 10 MG/ML IJ SOLN
INTRAMUSCULAR | Status: AC
  Filled 2018-01-17: qty 4

## 2018-01-17 MED ORDER — ENOXAPARIN SODIUM 40 MG/0.4ML ~~LOC~~ SOLN
40.0000 mg | SUBCUTANEOUS | Status: DC
  Administered 2018-01-18 – 2018-01-21 (×4): 40 mg via SUBCUTANEOUS
  Filled 2018-01-17 (×6): qty 0.4

## 2018-01-17 MED ORDER — MOMETASONE FURO-FORMOTEROL FUM 200-5 MCG/ACT IN AERO
2.0000 | INHALATION_SPRAY | Freq: Two times a day (BID) | RESPIRATORY_TRACT | Status: DC
  Administered 2018-01-18 – 2018-01-23 (×9): 2 via RESPIRATORY_TRACT
  Filled 2018-01-17: qty 8.8

## 2018-01-17 NOTE — ED Triage Notes (Signed)
Pt sent over from [facility] d/t increased swelling in BLE and hypoxia despite being on 2.5lpm [place]. Pt has been treated outpt with lasix and rocephin with no improvement and sent to be worked up for inpt tx and placed in rehab.

## 2018-01-17 NOTE — H&P (Signed)
History and Physical    NAWAL BURLING QQI:[id number] DOB: 1941-04-25 DOA: 01/17/2018  PCP: Sharilyn Sites, MD   Patient coming from: Home  Chief Complaint: bilateral leg swelling, SOB, increasing O2 requirement   HPI: KHRISTINE VERNO is a 77 y.o. female with medical history significant for hypothyroidism, type 2 diabetes mellitus, chronic diastolic CHF, COPD, and chronic hypoxic respiratory failure, now presenting to the emergency department for evaluation of progressive bilateral lower extremity edema and shortness of breath.  Patient reports that she was previously on 2 L/min of supplemental oxygen, recently increased to 2.5, but she has continued to be hypoxic in the 80s.  She reports progressive bilateral lower extremity edema and worsening dyspnea despite increasing her Lasix to 80 mg daily.  She denies fevers, chills, productive cough, or wheezing.  She denies chest pain.  She reports chronic orthopnea.  ED Course: Upon arrival to the ED, patient is found to be afebrile, saturating mid 80s on 4 L/min supplemental oxygen, mildly tachypneic, and vitals otherwise normal.  EKG features a sinus or ectopic atrial rhythm with low voltage QRS.  Chemistry panel is notable for cardiomegaly with vascular congestion.  Chemistry panel features a creatinine of  1.12, up from 0.8 remotely.  CBC is notable for a chronic leukocytosis with WBC 12,700.  Troponin is undetectable and BNP is elevated 293.  Patient was treated with DuoNeb and 40 mg IV Lasix in the ED.  She will be admitted to the telemetry unit for ongoing evaluation and management of acute on chronic hypoxic respiratory failure  suspected secondary to acute on chronic diastolic CHF.  Review of Systems:  All other systems reviewed and apart from HPI, are negative.  Past Medical History:  Diagnosis Date  . Anemia   . Arthritis   . Bronchitis 05/2014  . Cellulitis of lower extremity 06/2014   bilateral  . COPD (chronic obstructive pulmonary disease)  ([place])   . Diabetes mellitus   . Diverticulitis   . Gallstone   . Hypertension   . MRSA (methicillin resistant Staphylococcus aureus)    has now "tested negative"- no open wounds as of 02-10-15  . Obesity   . Shortness of breath    with exertion   . Sleep apnea    cpap at 10   . Sleep apnea   . Thyroid disease   . Tubular adenoma of colon     Past Surgical History:  Procedure Laterality Date  . ABDOMINAL HYSTERECTOMY  1979  . ABDOMINAL HYSTERECTOMY  1978  . APPENDECTOMY    . APPLICATION OF WOUND VAC  wound vac  . APPLICATION OF WOUND VAC  06/26/2012   Procedure: APPLICATION OF WOUND VAC;  Surgeon: Gayland Curry, MD,FACS;  Location: [facility];  Service: General;;  . BACK SURGERY    . COLON RESECTION  10/09/2011   Procedure: COLON RESECTION;  Surgeon: Gayland Curry, MD;  Location: [place];  Service: General;  Laterality: N/A;  Colon Resection with colostomy  . COLON RESECTION  06/26/2012   Procedure: COLON RESECTION;  Surgeon: Gayland Curry, MD,FACS;  Location: [facility];  Service: General;  Laterality: N/A;  . ESOPHAGOGASTRODUODENOSCOPY (EGD) WITH PROPOFOL N/A 02/17/2015   Procedure: ESOPHAGOGASTRODUODENOSCOPY (EGD) WITH PROPOFOL;  Surgeon: Jerene Bears, MD;  Location: [facility];  Service: Gastroenterology;  Laterality: N/A;  . jackson pratt    . PROCTOSCOPY  06/26/2012   Procedure: PROCTOSCOPY;  Surgeon: Gayland Curry, MD,FACS;  Location: [facility];  Service: General;  Laterality:  N/A;  Rigid Proctoscopy     reports that she quit smoking about 7 years ago. She has never used smokeless tobacco. She reports that she does not drink alcohol or use drugs.  Allergies  Allergen Reactions  . Adhesive [Tape] Hives and Rash  . Codeine Itching and Other (See Comments)  . Vancomycin Rash  . Zosyn [Piperacillin Sod-Tazobactam So] Rash    Family History  Problem Relation Age of Onset  . Heart disease Father   . Diabetes Father   . Hypertension Father   . Diabetes Mother   . Hypertension  Mother   . Heart disease Brother   . Diabetes Brother   . Diabetes Sister   . Colon cancer Neg Hx   . Colon polyps Neg Hx   . Esophageal cancer Neg Hx   . Kidney disease Neg Hx   . Gallbladder disease Neg Hx      Prior to Admission medications   Medication Sig Start Date End Date Taking? Authorizing Provider  aspirin 81 MG tablet Take 162 mg by mouth daily. Take 2 tablets, by mouth, every day    [provider]  budesonide-formoterol (SYMBICORT) 160-4.5 MCG/ACT inhaler Inhale 2 puffs into the lungs 2 (two) times daily.    [provider]  Calcium Carb-Cholecalciferol (CALCIUM 600 + D PO) Take 2 tablets by mouth daily.    [provider]  calcium carbonate (OSCAL) 1500 (600 Ca) MG TABS tablet Take by mouth 2 (two) times daily with a meal.    [provider]  clobetasol (TEMOVATE) 0.05 % GEL Apply topically 2 (two) times daily.    [provider]  furosemide (LASIX) 80 MG tablet  12/25/17   [provider]  Glucosamine-Chondroitin-Vit D3 1500-1200-800 MG-MG-UNIT PACK Take 2 tablets by mouth daily.    [provider]  guaiFENesin (MUCINEX) 600 MG 12 hr tablet Take by mouth 3 (three) times daily.    [provider]  JANUVIA 100 MG tablet Take 100 mg by mouth daily.  11/11/14   [provider]  levothyroxine (SYNTHROID, LEVOTHROID) 50 MCG tablet Take 50 mcg by mouth daily. 08/04/15   [provider]  meloxicam (MOBIC) 7.5 MG tablet Take 7.5 mg by mouth daily.     [provider]  metFORMIN (GLUCOPHAGE) 500 MG tablet Take 1,000 mg by mouth 2 (two) times daily with a meal.     [provider]  montelukast (SINGULAIR) 10 MG tablet Take 10 mg by mouth daily.     [provider]  nystatin (MYCOSTATIN) 100000 UNIT/ML suspension Take by mouth. Swish and spit 07/27/15   [provider]  nystatin (MYCOSTATIN) powder Apply topically as needed. 08/20/15   [provider]    nystatin cream (MYCOSTATIN) Apply topically as needed. 08/01/15   [provider]  OXYGEN Inhale into the lungs at bedtime.    [provider]  polyethylene glycol (MIRALAX / GLYCOLAX) packet Take 17 g by mouth daily.    [provider]  traMADol (ULTRAM) 50 MG tablet Take 50 mg by mouth at bedtime as needed for moderate pain.  01/09/12   [provider]  Wheat Dextrin (BENEFIBER) POWD Take by mouth as directed.    [provider]    Physical Exam: Vitals:   01/17/18 1930 01/17/18 1945 01/17/18 2015 01/17/18 2030  BP: (!) 105/92 (!) 110/98 (!) 115/56 (!) 116/55  Pulse: 76 81 72 75  Resp: (!) 28 (!) 24 20 17   Temp:  TempSrc:      SpO2: (!) 88% (!) 85% 94% 93%      Constitutional: NAD, calm, obese Eyes: PERTLA, lids and conjunctivae normal ENMT: Mucous membranes are moist. Posterior pharynx clear of any exudate or lesions.   Neck: normal, supple, no masses, no thyromegaly Respiratory: Breath sounds diminished bilaterally. Dyspnea with speech. Mild tachypnea. No accessory muscle use.  Cardiovascular: S1 & S2 heard, regular rate and rhythm. Pretibial edema bilaterally.   Abdomen: No distension, no tenderness, soft. Bowel sounds normal.  Musculoskeletal: no clubbing / cyanosis. No joint deformity upper and lower extremities.    Skin: Erythema to distal LE's bilaterally in gaiter distribution. Warm, dry, well-perfused. Neurologic: No facial asymmetry. Sensation intact. Strength 5/5 in all 4 limbs.  Psychiatric: Alert and oriented x 3. Pleasant and cooperative.     Labs on Admission: I have personally reviewed following labs and imaging studies  CBC: Recent Labs  Lab 01/17/18 1738  WBC 12.7*  NEUTROABS 9.3*  HGB 14.6  HCT 48.1*  MCV 83.4  PLT 161   Basic Metabolic Panel: Recent Labs  Lab 01/17/18 1738  NA 138  K 3.7  CL 94*  CO2 29  GLUCOSE 110*  BUN 18  CREATININE 1.12*  CALCIUM 9.4   GFR: CrCl cannot be  calculated (Unknown ideal weight.). Liver Function Tests: No results for input(s): AST, ALT, ALKPHOS, BILITOT, PROT, ALBUMIN in the last 168 hours. No results for input(s): LIPASE, AMYLASE in the last 168 hours. No results for input(s): AMMONIA in the last 168 hours. Coagulation Profile: No results for input(s): INR, PROTIME in the last 168 hours. Cardiac Enzymes: No results for input(s): CKTOTAL, CKMB, CKMBINDEX, TROPONINI in the last 168 hours. BNP (last 3 results) No results for input(s): PROBNP in the last 8760 hours. HbA1C: No results for input(s): HGBA1C in the last 72 hours. CBG: No results for input(s): GLUCAP in the last 168 hours. Lipid Profile: No results for input(s): CHOL, HDL, LDLCALC, TRIG, CHOLHDL, LDLDIRECT in the last 72 hours. Thyroid Function Tests: No results for input(s): TSH, T4TOTAL, FREET4, T3FREE, THYROIDAB in the last 72 hours. Anemia Panel: No results for input(s): VITAMINB12, FOLATE, FERRITIN, TIBC, IRON, RETICCTPCT in the last 72 hours. Urine analysis:    Component Value Date/Time   COLORURINE YELLOW 06/25/2014 1823   APPEARANCEUR CLEAR 06/25/2014 1823   LABSPEC 1.020 06/25/2014 1823   PHURINE 5.5 06/25/2014 1823   GLUCOSEU NEGATIVE 06/25/2014 1823   HGBUR NEGATIVE 06/25/2014 1823   BILIRUBINUR NEGATIVE 06/25/2014 1823   KETONESUR NEGATIVE 06/25/2014 1823   PROTEINUR NEGATIVE 06/25/2014 1823   UROBILINOGEN 0.2 06/25/2014 1823   NITRITE NEGATIVE 06/25/2014 1823   LEUKOCYTESUR NEGATIVE 06/25/2014 1823   Sepsis Labs: @LABRCNTIP (procalcitonin:4,lacticidven:4) )No results found for this or any previous visit (from the past 240 hour(s)).   Radiological Exams on Admission: Dg Chest Port 1 View  Result Date: 01/17/2018 CLINICAL DATA:  Increased swelling in the lower extremities EXAM: PORTABLE CHEST 1 VIEW COMPARISON:  07/10/2017 FINDINGS: Cardiomegaly with mild vascular congestion. Possible small left effusion. Unable to exclude left basilar  atelectasis or airspace disease. Aortic atherosclerosis. No pneumothorax. IMPRESSION: 1. Cardiomegaly with mild central vascular congestion 2. Possible small left effusion. There may be atelectasis or partial consolidation in the left retrocardiac space. Electronically Signed   By: Donavan Foil M.D.   On: 01/17/2018 19:28    EKG: Independently reviewed. Sinus or ectopic atrial rhythm.   Assessment/Plan  1. Acute on chronic diastolic CHF; acute on chronic hypoxic respiratory  failure  - Presents with progressive bilateral leg edema and SOB with increasing supplemental O2-requirement   - Found to have marked peripheral edema, mild BNP elevation, cardiomegaly and vascular congestion on CXR, no fever or productive cough, no chest pain  - Suspect acute on chronic CHF  - Treated in ED with Lasix 40 mg IV   - Continue diuresis with Lasix 40 mg IV q12h and increase as needed  - Continue cardiac monitoring, daily wts, I/O's, daily chem panel, update echocardiogram    2. COPD  - No wheezing on admission  - Continue ICS/LABA and prn albuterol    3. Mild renal insufficiency  - SCr is 1.12 on admission; no recent labs available, was 0.8 remotely  - Check urine chemistries, follow daily chem panel during diuresis    4. Hypothyroidism  - Continue Synthroid    5. Type II DM  - A1c was 6.9% remotely - Managed at home with Januvia and metformin, held on admission  - Check CBG's and use a SSI with Novolog while in hospital     DVT prophylaxis: Lovenox Code Status: Full  Family Communication: Husband updated at bedside Consults called: None Admission status: Inpatient    Vianne Bulls, MD [facility] Pager [phone]  If 7PM-7AM, please contact night-coverage [email or web address] Password [facility]  01/17/2018, 8:53 PM

## 2018-01-17 NOTE — ED Provider Notes (Signed)
Patient placed in Quick Look pathway, seen and evaluated   Chief Complaint: Bilateral lower extremity swelling, shortness of breath.  HPI:   77 y.o. female past mental history of diabetes, hypertension, cellulitis of bilateral lower extremities, who presents for evaluation of worsening bilateral lower extremity edema, shortness of breath.  Patient reports that these symptoms have been going on for the last week.  Patient states that she is on Lasix which she has been taking daily.  Patient reports that over last few days, she knows she is got more short of breath.  She states that at baseline she is on 2.5 L O2 but states she has had to bump it up to 3 L.  Patient reports that she does sleep any upright recliner but states that that is not new.  She has not had any coughing.  Patient reports that she is also had some bilateral lower extremity edema.  She feels like the left is slightly greater than the right.  Patient reports that she is also had some overlying warmth, erythema of bilateral lower extremities.  Patient states that she has been treated with antibiotics and still has not had any improvement.  Patient went to her doctor today for evaluation of symptoms and was instructed to go to the emergency department for further evaluation.  Patient denies any fevers, chest pain.  ROS: Shortness of breath, leg swelling.  Physical Exam:   Gen: No distress  Neuro: Awake and Alert  Skin: Warm  MSK: 2+ pitting edema noted to bilateral lower extremities that begins at the proximal tib-fib and extends distally.  There is some overlying warmth, erythema bilateral lower extremities.  They appear symmetric in appearance.  Lungs: Diffuse rales.  Patient speaking in sentences clearly without any difficulty.   5:58 PM: Soil scientist that patient would need to be moved back to main ED.  Initiation of care has begun. The patient has been counseled on the process, plan, and necessity for staying for the  completion/evaluation, and the remainder of the medical screening examination    Volanda Napoleon, PA-C 01/17/18 1800    Pattricia Boss, MD 01/18/18 2325

## 2018-01-17 NOTE — ED Notes (Signed)
Attempted report to 3E x2. 

## 2018-01-17 NOTE — ED Provider Notes (Signed)
[facility] Provider Note   CSN: [id number] Arrival date & time: 01/17/18  1636     History   Chief Complaint Chief Complaint  Patient presents with  . Leg Swelling    HPI [name] is a 77 y.o. female.   The history is provided by the patient.   Shortness of Breath   This is a chronic problem. The problem occurs intermittently.The problem has been gradually worsening. Associated symptoms include leg swelling. Pertinent negatives include no fever, no sore throat, no ear pain, no cough, no chest pain, no vomiting, no abdominal pain and no rash. It is unknown what precipitated the problem. She has tried inhaled steroids for the symptoms. The treatment provided no relief. Associated medical issues include COPD and heart failure.    Past Medical History:  Diagnosis Date  . Anemia   . Arthritis   . Bronchitis 05/2014  . Cellulitis of lower extremity 06/2014   bilateral  . COPD (chronic obstructive pulmonary disease) ([place])   . Diabetes mellitus   . Diverticulitis   . Gallstone   . Hypertension   . MRSA (methicillin resistant Staphylococcus aureus)    has now "tested negative"- no open wounds as of 02-10-15  . Obesity   . Shortness of breath    with exertion   . Sleep apnea    cpap at 10   . Sleep apnea   . Thyroid disease   . Tubular adenoma of colon     Patient Active Problem List   Diagnosis Date Noted  . Mild renal insufficiency 01/17/2018  . Acute on chronic respiratory failure with hypoxia ([place]) 01/17/2018  . Acute on chronic diastolic CHF (congestive heart failure) ([place]) 01/17/2018  . Bilateral lower extremity edema 07/17/2017  . 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 IDA (iron deficiency anemia)   . Leukocytosis 11/06/2014  . COPD (chronic obstructive pulmonary disease) ([place]) 06/05/2014  . Hypoxia 06/05/2014  . Iron deficiency anemia due to chronic blood loss 04/12/2014  . Postoperative MRSA  infection of lower abdominal wound 03/22/2014  . Thrombocytosis ([place]) 03/20/2014  . Incisional hernia without mention of obstruction or gangrene 05/13/2013  . Cellulitis 03/14/2013  . Draining postoperative wound 01/27/2013  . S/P colon resection 06/27/2012  . Obesity, Class III, BMI 40-49.9 (morbid obesity) ([place]) 10/18/2011  . Diverticulitis of colon with perforation sigmoid colectomy and colostomy 1/15 10/09/2011  . Hypertension 10/09/2011  . Diabetes mellitus ([place]) 10/09/2011  . OBSTRUCTIVE SLEEP APNEA 03/10/2008    Past Surgical History:  Procedure Laterality Date  . ABDOMINAL HYSTERECTOMY  1979  . ABDOMINAL HYSTERECTOMY  1978  . APPENDECTOMY    . APPLICATION OF WOUND VAC  wound vac  . APPLICATION OF WOUND VAC  06/26/2012   Procedure: APPLICATION OF WOUND VAC;  Surgeon: Gayland Curry, MD,FACS;  Location: [facility];  Service: General;;  . BACK SURGERY    . COLON RESECTION  10/09/2011   Procedure: COLON RESECTION;  Surgeon: Gayland Curry, MD;  Location: [facility];  Service: General;  Laterality: N/A;  Colon Resection with colostomy  . COLON RESECTION  06/26/2012   Procedure: COLON RESECTION;  Surgeon: Gayland Curry, MD,FACS;  Location: [facility];  Service: General;  Laterality: N/A;  . ESOPHAGOGASTRODUODENOSCOPY (EGD) WITH PROPOFOL N/A 02/17/2015   Procedure: ESOPHAGOGASTRODUODENOSCOPY (EGD) WITH PROPOFOL;  Surgeon: Jerene Bears, MD;  Location: [facility];  Service: Gastroenterology;  Laterality: N/A;  . jackson pratt    .  PROCTOSCOPY  06/26/2012   Procedure: PROCTOSCOPY;  Surgeon: Gayland Curry, MD,FACS;  Location: [facility];  Service: General;  Laterality: N/A;  Rigid Proctoscopy     OB History   None      Home Medications    Prior to Admission medications   Medication Sig Start Date End Date Taking? Authorizing Provider  albuterol (PROAIR HFA) 108 (90 Base) MCG/ACT inhaler Inhale 2 puffs into the lungs every 6 (six) hours as needed for wheezing or shortness of breath.   Yes [provider]  antiseptic oral rinse (BIOTENE) LIQD 15 mLs by Mouth Rinse route as needed for dry mouth.   Yes [provider]  aspirin 81 MG tablet Take 81 mg by mouth at bedtime.    Yes [provider]  budesonide-formoterol (SYMBICORT) 160-4.5 MCG/ACT inhaler Inhale 2 puffs into the lungs 2 (two) times daily.   Yes [provider]  Calcium Carb-Cholecalciferol (CALCIUM 600 + D PO) Take 2 tablets by mouth daily.   Yes [provider]  furosemide (LASIX) 80 MG tablet Take 80 mg by mouth See admin instructions. Take 80 mg by mouth in the morning and 80 mg at 1:30 PM daily 12/25/17  Yes [provider]  Glucosamine-Chondroitin-Vit D3 1500-1200-800 MG-MG-UNIT PACK Take 1 tablet by mouth 2 (two) times daily.    Yes [provider]  ipratropium-albuterol (DUONEB) 0.5-2.5 (3) MG/3ML SOLN Take 3 mLs by nebulization every 4 (four) hours as needed (for wheezing or shortness of breath).  01/10/18  Yes [provider]  JANUVIA 100 MG tablet Take 100 mg by mouth daily.  11/11/14  Yes [provider]  levothyroxine (SYNTHROID, LEVOTHROID) 50 MCG tablet Take 50 mcg by mouth daily. 08/04/15  Yes [provider]  meloxicam (MOBIC) 7.5 MG tablet Take 7.5 mg by mouth daily.    Yes [provider]  metFORMIN (GLUCOPHAGE) 500 MG tablet Take 1,000 mg by mouth 2 (two) times daily with a meal.    Yes [provider]  montelukast (SINGULAIR) 10 MG tablet Take 10 mg by mouth daily.    Yes [provider]  OXYGEN Inhale 2.5 L into the lungs See admin instructions. 2.5 liters as needed for shortness of breath and SCHEDULED at bedtime   Yes [provider]  polyethylene glycol (MIRALAX / GLYCOLAX) packet Take 17 g by mouth daily with lunch.    Yes [provider]  traMADol (ULTRAM) 50 MG tablet Take 50 mg by mouth every 4 (four) hours as needed (for pain).  01/09/12  Yes [provider]  UNABLE TO  FIND CPAP: At bedtime and during naps   Yes [provider]  Vitamins A & D (VITAMIN A & D) ointment Apply 1 application topically as needed (to affected areas under breasts and buttocks).    Yes [provider]  Wheat Dextrin (BENEFIBER) POWD Take 15 g by mouth daily.    Yes [provider]    Family History Family History  Problem Relation Age of Onset  . Heart disease Father   . Diabetes Father   . Hypertension Father   . Diabetes Mother   . Hypertension Mother   . Heart disease Brother   . Diabetes Brother   . Diabetes Sister   . Colon cancer Neg Hx   . Colon polyps Neg Hx   . Esophageal cancer Neg Hx   . Kidney disease Neg Hx   . Gallbladder disease Neg Hx  Social History Social History   Tobacco Use  . Smoking status: Former Smoker    Last attempt to quit: 09/24/2010    Years since quitting: 7.3  . Smokeless tobacco: Never Used  Substance Use Topics  . Alcohol use: No    Alcohol/week: 0.0 oz  . Drug use: No     Allergies   Adhesive [tape]; Codeine; Vancomycin; and Zosyn [piperacillin sod-tazobactam so]   Review of Systems Review of Systems  Constitutional: Negative for chills and fever.  HENT: Negative for ear pain and sore throat.   Eyes: Negative for pain and visual disturbance.  Respiratory: Positive for shortness of breath. Negative for cough.   Cardiovascular: Positive for leg swelling. Negative for chest pain and palpitations.  Gastrointestinal: Negative for abdominal pain and vomiting.  Genitourinary: Negative for dysuria and hematuria.  Musculoskeletal: Negative for arthralgias and back pain.  Skin: Negative for color change and rash.  Neurological: Negative for seizures and syncope.  All other systems reviewed and are negative.    Physical Exam Updated Vital Signs  ED Triage Vitals  Enc Vitals Group     BP 01/17/18 1730 139/74     Pulse Rate 01/17/18 1730 75     Resp 01/17/18 1730 (!) 24     Temp 01/17/18  1730 98.1 F (36.7 C)     Temp Source 01/17/18 1730 Oral     SpO2 01/17/18 1730 (!) 83 %     Weight --      Height --      Head Circumference --      Peak Flow --      Pain Score 01/17/18 1728 7     Pain Loc --      Pain Edu? --      Excl. in [place]? --     Physical Exam  Constitutional: She is oriented to person, place, and time. She appears well-developed and well-nourished. No distress.  HENT:  Head: Normocephalic and atraumatic.  Mouth/Throat: Oropharynx is clear and moist. No oropharyngeal exudate.  Eyes: Pupils are equal, round, and reactive to light. Conjunctivae and EOM are normal.  Neck: Normal range of motion. Neck supple.  Cardiovascular: Normal rate, regular rhythm, normal heart sounds and intact distal pulses.  No murmur heard. Pulmonary/Chest: She has rales.  Poor effort, tachypnea, 4L O2, diminished throughout  Abdominal: Soft. There is no tenderness.  Musculoskeletal: She exhibits edema.  Neurological: She is alert and oriented to person, place, and time.  Skin: Skin is warm and dry. Capillary refill takes less than 2 seconds. No rash noted.  B/l lower extremities with redness and warmth  Psychiatric: She has a normal mood and affect.  Nursing note and vitals reviewed.    ED Treatments / Results  Labs (all labs ordered are listed, but only abnormal results are displayed) Labs Reviewed  BASIC METABOLIC PANEL - Abnormal; Notable for the following components:      Result Value   Chloride 94 (*)    Glucose, Bld 110 (*)    Creatinine, Ser 1.12 (*)    GFR calc non Af Amer 46 (*)    GFR calc Af Amer 53 (*)    All other components within normal limits  CBC WITH DIFFERENTIAL/PLATELET - Abnormal; Notable for the following components:   WBC 12.7 (*)    RBC 5.77 (*)    HCT 48.1 (*)    MCH 25.3 (*)    RDW 16.2 (*)    Neutro Abs 9.3 (*)  All other components within normal limits  BRAIN NATRIURETIC PEPTIDE - Abnormal; Notable for the following components:   B  Natriuretic Peptide 192.6 (*)    All other components within normal limits  I-STAT VENOUS BLOOD GAS, ED - Abnormal; Notable for the following components:   pCO2, Ven 66.2 (*)    pO2, Ven 21.0 (*)    Bicarbonate 37.7 (*)    TCO2 40 (*)    Acid-Base Excess 9.0 (*)    All other components within normal limits  SODIUM, URINE, RANDOM  CREATININE, URINE, RANDOM  UREA NITROGEN, URINE  BASIC METABOLIC PANEL  I-STAT TROPONIN, ED    EKG EKG Interpretation  Date/Time:  Friday January 17 2018 18:22:40 EDT Ventricular Rate:  76 PR Interval:    QRS Duration: 102 QT Interval:  394 QTC Calculation: 443 R Axis:   129 Text Interpretation:  Sinus or ectopic atrial rhythm Low voltage, precordial leads Probable right ventricular hypertrophy Borderline T abnormalities, anterior leads When comapred to prior, no significant changes seen.  No STEMI Confirmed by Antony Blackbird [phone]) on 01/17/2018 6:25:59 PM   Radiology Dg Chest Port 1 View  Result Date: 01/17/2018 CLINICAL DATA:  Increased swelling in the lower extremities EXAM: PORTABLE CHEST 1 VIEW COMPARISON:  07/10/2017 FINDINGS: Cardiomegaly with mild vascular congestion. Possible small left effusion. Unable to exclude left basilar atelectasis or airspace disease. Aortic atherosclerosis. No pneumothorax. IMPRESSION: 1. Cardiomegaly with mild central vascular congestion 2. Possible small left effusion. There may be atelectasis or partial consolidation in the left retrocardiac space. Electronically Signed   By: Donavan Foil M.D.   On: 01/17/2018 19:28    Procedures Procedures (including critical care time)  Medications Ordered in ED Medications  aspirin EC tablet 162 mg (has no administration in time range)  mometasone-formoterol (DULERA) 200-5 MCG/ACT inhaler 2 puff (0 puffs Inhalation Hold 01/17/18 2119)  calcium carbonate (OS-CAL - dosed in mg of elemental calcium) tablet 1,250 mg (has no administration in time range)  guaiFENesin (MUCINEX) 12  hr tablet 1,200 mg (has no administration in time range)  levothyroxine (SYNTHROID, LEVOTHROID) tablet 50 mcg (has no administration in time range)  montelukast (SINGULAIR) tablet 10 mg (has no administration in time range)  polyethylene glycol (MIRALAX / GLYCOLAX) packet 17 g (has no administration in time range)  traMADol (ULTRAM) tablet 50-100 mg (has no administration in time range)  sodium chloride flush (NS) 0.9 % injection 3 mL (has no administration in time range)  sodium chloride flush (NS) 0.9 % injection 3 mL (has no administration in time range)  0.9 %  sodium chloride infusion (has no administration in time range)  acetaminophen (TYLENOL) tablet 650 mg (has no administration in time range)  ondansetron (ZOFRAN) injection 4 mg (has no administration in time range)  enoxaparin (LOVENOX) injection 40 mg (has no administration in time range)  ALPRAZolam (XANAX) tablet 0.25 mg (has no administration in time range)  zolpidem (AMBIEN) tablet 5 mg (has no administration in time range)  albuterol (PROVENTIL) (2.5 MG/3ML) 0.083% nebulizer solution 2.5 mg (has no administration in time range)  furosemide (LASIX) injection 40 mg (has no administration in time range)  furosemide (LASIX) 10 MG/ML injection (has no administration in time range)  ipratropium-albuterol (DUONEB) 0.5-2.5 (3) MG/3ML nebulizer solution 3 mL (3 mLs Nebulization Given 01/17/18 1859)  furosemide (LASIX) injection 40 mg (40 mg Intravenous Given 01/17/18 2131)  furosemide (LASIX) injection 40 mg (40 mg Intravenous Given 01/17/18 2130)     Initial Impression / Assessment  and Plan / ED Course  I have reviewed the triage vital signs and the nursing notes.  Pertinent labs & imaging results that were available during my care of the patient were reviewed by me and considered in my medical decision making (see chart for details).     [name] is a 77 year old female with history of COPD, heart failure, diabetes, sleep  apnea who presents to the ED with shortness of breath, leg swelling.  Patient with overall normal vitals.  No fever.  Patient has been trying with primary care provider to increase diuresis given edema.  Patient is normally on 2.5 L of oxygen at home.  Patient denies any chest pain.  Patient EKG performed that showed normal sinus rhythm with no signs of ischemic changes and fairly unchanged from prior.  Echocardiogram was done last year that showed diastolic failure.  Patient denies any history of DVT or PE.  Has had DVT rule out recently.  Patient does appear to have some swelling and redness of bilateral lower legs.  However, it appears from chronic stasis and low concern for cellulitis at this time.  Patient has no wheezing on exam.  Has rales.  Suspect more likely heart failure exacerbation and COPD exacerbation.  Labs obtained including chest x-ray, BNP, troponin.  Patient given DuoNeb's, IV lasix.  Patient with normal troponin.  BNP elevated.  Chest x-ray shows pulmonary congestion.  Patient with no improvement following breathing treatments.  Suspect likely heart failure exacerbation. Doutb ACS. Patient with no significant electrolyte abnormality.  Creatinine slightly up a bit.  Patient to be admitted to medicine for further care. Low concern for PE. Patient with 4 L oxygen requirement throughout my care.  Likely symptoms from worsening heart failure.  Blood gas showed normal pH,  mild hypercarbia.  Medicine took over care with patient in hemodynamically stable condition.   Final Clinical Impressions(s) / ED Diagnoses   Final diagnoses:  Acute on chronic respiratory failure with hypoxia (H[facility])  Acute on chronic diastolic CHF (congestive heart failure) ([place])  Bilateral lower extremity edema    ED Discharge Orders    None      Lennice Sites, DO 01/17/18 2341  Tegeler, Gwenyth Allegra, MD 01/18/18 1343  Tegeler, Gwenyth Allegra, MD 01/28/18 [phone]

## 2018-01-17 NOTE — ED Notes (Signed)
Attempted report x 1 to 3E. Per [place] the nurse will call back. Phone extension provided for call back.

## 2018-01-18 ENCOUNTER — Inpatient Hospital Stay (HOSPITAL_COMMUNITY): Payer: Medicare Other

## 2018-01-18 ENCOUNTER — Other Ambulatory Visit: Payer: Self-pay

## 2018-01-18 DIAGNOSIS — I509 Heart failure, unspecified: Secondary | ICD-10-CM

## 2018-01-18 DIAGNOSIS — R600 Localized edema: Secondary | ICD-10-CM

## 2018-01-18 LAB — MAGNESIUM: MAGNESIUM: 2 mg/dL (ref 1.7–2.4)

## 2018-01-18 LAB — BASIC METABOLIC PANEL
Anion gap: 11 (ref 5–15)
BUN: 21 mg/dL — ABNORMAL HIGH (ref 6–20)
CO2: 33 mmol/L — ABNORMAL HIGH (ref 22–32)
CREATININE: 1.2 mg/dL — AB (ref 0.44–1.00)
Calcium: 8.7 mg/dL — ABNORMAL LOW (ref 8.9–10.3)
Chloride: 94 mmol/L — ABNORMAL LOW (ref 101–111)
GFR calc Af Amer: 49 mL/min — ABNORMAL LOW (ref >60)
GFR, EST NON AFRICAN AMERICAN: 42 mL/min — AB (ref >60)
Glucose, Bld: 118 mg/dL — ABNORMAL HIGH (ref 65–99)
Potassium: 3.6 mmol/L (ref 3.5–5.1)
SODIUM: 138 mmol/L (ref 135–145)

## 2018-01-18 LAB — GLUCOSE, CAPILLARY
GLUCOSE-CAPILLARY: 146 mg/dL — AB (ref 65–99)
GLUCOSE-CAPILLARY: 126 mg/dL — AB (ref 65–99)
Glucose-Capillary: 141 mg/dL — ABNORMAL HIGH (ref 65–99)

## 2018-01-18 LAB — MRSA PCR SCREENING: MRSA by PCR: NEGATIVE

## 2018-01-18 MED ORDER — POLYETHYLENE GLYCOL 3350 17 G PO PACK
17.0000 g | PACK | Freq: Every day | ORAL | Status: DC
  Administered 2018-01-18 – 2018-01-23 (×6): 17 g via ORAL
  Filled 2018-01-18 (×5): qty 1

## 2018-01-18 MED ORDER — POTASSIUM CHLORIDE CRYS ER 20 MEQ PO TBCR
40.0000 meq | EXTENDED_RELEASE_TABLET | Freq: Once | ORAL | Status: AC
  Administered 2018-01-18: 40 meq via ORAL
  Filled 2018-01-18: qty 2

## 2018-01-18 MED ORDER — FUROSEMIDE 10 MG/ML IJ SOLN
80.0000 mg | Freq: Two times a day (BID) | INTRAMUSCULAR | Status: DC
  Administered 2018-01-18 – 2018-01-23 (×10): 80 mg via INTRAVENOUS
  Filled 2018-01-18 (×10): qty 8

## 2018-01-18 NOTE — Progress Notes (Signed)
Patient resting comfortably during shift report. Denies complaints.  

## 2018-01-18 NOTE — Progress Notes (Signed)
purewick replaced again

## 2018-01-18 NOTE — Progress Notes (Signed)
Pure wick placed.

## 2018-01-18 NOTE — Plan of Care (Signed)
  Problem: Nutrition: Goal: Adequate nutrition will be maintained Outcome: Completed/Met   Problem: Coping: Goal: Level of anxiety will decrease Outcome: Completed/Met   Problem: Elimination: Goal: Will not experience complications related to bowel motility Outcome: Completed/Met   Problem: Pain Managment: Goal: General experience of comfort will improve Outcome: Completed/Met   Problem: Safety: Goal: Ability to remain free from injury will improve Outcome: Completed/Met   

## 2018-01-18 NOTE — Plan of Care (Signed)
  Problem: Coping: Goal: Level of anxiety will decrease Outcome: Completed/Met   Problem: Elimination: Goal: Will not experience complications related to bowel motility Outcome: Completed/Met   Problem: Pain Managment: Goal: General experience of comfort will improve Outcome: Completed/Met

## 2018-01-18 NOTE — Evaluation (Signed)
Physical Therapy Evaluation Patient Details Name: [name] MRN: [id number] DOB: [birth year] Today's Date: 01/18/2018   History of Present Illness  Pt adm with acute on chronic respiratory failure and acute on chronic diastolic heart failure. PMH - copd, osa, morbid obesity,   Clinical Impression  Pt presents to PT with limited mobility but isn't far from her baseline. At home pt has aide from 8-430 and again from 6-8 in addition to husband at home. She sits/sleeps in her lift chair. Pt assisted with household amb with walker and uses w/c if out in community. Husband has built her a step so she can get into higher car. Pt is able to get from lift chair to bsc at night on her own. Pt comfortable with her adaptations at home and feels she can return home with the same set up. Will follow acutely but feel she will be close enough to baseline at dc to not need PT after dc.    Follow Up Recommendations No PT follow up;Supervision for mobility/OOB    Equipment Recommendations  None recommended by PT    Recommendations for Other Services       Precautions / Restrictions Precautions Precautions: Fall      Mobility  Bed Mobility Overal bed mobility: Needs Assistance Bed Mobility: Supine to Sit;Sit to Supine     Supine to sit: Min assist Sit to supine: Min assist   General bed mobility comments: Assist to elevate trunk into sitting and to bring legs back up into bed when returning to supine.  Transfers Overall transfer level: Needs assistance Equipment used: Rolling walker (2 wheeled) Transfers: Sit to/from Stand Sit to Stand: Min assist;From elevated surface         General transfer comment: Assist to bring hips up. Pt had to have bed elevated ~4 inches to come to stand  Ambulation/Gait Ambulation/Gait assistance: Min guard Ambulation Distance (Feet): 3 Feet Assistive device: Rolling walker (2 wheeled) Gait Pattern/deviations: Step-through pattern;Decreased step length -  right;Decreased step length - left;Wide base of support Gait velocity: decr Gait velocity interpretation: <1.31 ft/sec, indicative of household ambulator General Gait Details: Assist for safety. Distance limited by lines/tubes and fatigue  Stairs            Wheelchair Mobility    Modified Rankin (Stroke Patients Only)       Balance Overall balance assessment: Needs assistance Sitting-balance support: No upper extremity supported;Feet supported Sitting balance-Leahy Scale: Fair     Standing balance support: Bilateral upper extremity supported Standing balance-Leahy Scale: Poor Standing balance comment: walker and min guard for static standing                             Pertinent Vitals/Pain Pain Assessment: Faces Faces Pain Scale: Hurts even more Pain Location: neck Pain Descriptors / Indicators: Grimacing;Guarding Pain Intervention(s): Limited activity within patient's tolerance;Repositioned    Home Living Family/patient expects to be discharged to:: Private residence Living Arrangements: Spouse/significant other Available Help at Discharge: Family;Personal care attendant;Available 24 hours/day(HIred help 8-4:30 and 6-8) Type of Home: House Home Access: Stairs to enter Entrance Stairs-Rails: Right;Left;Can reach both Entrance Stairs-Number of Steps: 2-4 Home Layout: One level Home Equipment: Walker - 2 wheels;Walker - 4 wheels;Wheelchair - manual;Tub bench;Other (comment)(home O2, lift chair.) Additional Comments: Pt has lift chair at home. Sleeps in lift chair.     Prior Function Level of Independence: Needs assistance   Gait / Transfers Assistance Needed: amb household  distances with walker and supervision to min assist.           Hand Dominance        Extremity/Trunk Assessment   Upper Extremity Assessment Upper Extremity Assessment: Generalized weakness    Lower Extremity Assessment Lower Extremity Assessment: Generalized  weakness    Cervical / Trunk Assessment Cervical / Trunk Assessment: Kyphotic  Communication   Communication: No difficulties  Cognition Arousal/Alertness: Awake/alert Behavior During Therapy: WFL for tasks assessed/performed Overall Cognitive Status: Within Functional Limits for tasks assessed                                        General Comments General comments (skin integrity, edema, etc.): At rest pt with SpO2 89% on 4l of O2    Exercises     Assessment/Plan    PT Assessment Patient needs continued PT services  PT Problem List Decreased strength;Decreased activity tolerance;Decreased balance;Decreased mobility;Obesity       PT Treatment Interventions DME instruction;Gait training;Functional mobility training;Therapeutic activities;Therapeutic exercise;Balance training;Patient/family education    PT Goals (Current goals can be found in the Care Plan section)  Acute Rehab PT Goals Patient Stated Goal: return home PT Goal Formulation: With patient Time For Goal Achievement: 02/01/18 Potential to Achieve Goals: Good    Frequency Min 3X/week   Barriers to discharge Inaccessible home environment stairs to enter    Co-evaluation               AM-PAC PT "6 Clicks" Daily Activity  Outcome Measure Difficulty turning over in bed (including adjusting bedclothes, sheets and blankets)?: Unable Difficulty moving from lying on back to sitting on the side of the bed? : Unable Difficulty sitting down on and standing up from a chair with arms (e.g., wheelchair, bedside commode, etc,.)?: Unable Help needed moving to and from a bed to chair (including a wheelchair)?: A Little Help needed walking in hospital room?: A Little Help needed climbing 3-5 steps with a railing? : A Lot 6 Click Score: 11    End of Session Equipment Utilized During Treatment: Oxygen Activity Tolerance: Patient limited by fatigue Patient left: in bed;with call bell/phone within  reach Nurse Communication: Mobility status PT Visit Diagnosis: Unsteadiness on feet (R26.81);Other abnormalities of gait and mobility (R26.89);Muscle weakness (generalized) (M62.81)    Time: 1150-1210 PT Time Calculation (min) (ACUTE ONLY): 20 min   Charges:   PT Evaluation $PT Eval Moderate Complexity: 1 Mod     PT G CodesMarland Kitchen        [facility] PT [place] 01/18/2018, 2:00 PM

## 2018-01-18 NOTE — Progress Notes (Signed)
Patient asking to have her home meds placed on [facility], asking for Mobic in particular.

## 2018-01-18 NOTE — Progress Notes (Signed)
Page to Dr Eliseo Squires   Bee. pt with spurts of tachycardia today, at rest. irregular rhythm. just fyi/update, asymptomatic.

## 2018-01-18 NOTE — Progress Notes (Signed)
PROGRESS NOTE    [name]  WNU:[id number] DOB: August 07, 1941 DOA: 01/17/2018 PCP: Sharilyn Sites, MD   Outpatient Specialists:     Brief Narrative:  [name] is a 77 y.o. female with medical history significant for hypothyroidism, type 2 diabetes mellitus, chronic diastolic CHF, COPD, and chronic hypoxic respiratory failure, now presenting to the emergency department for evaluation of progressive bilateral lower extremity edema and shortness of breath.  Patient reports that she was previously on 2 L/min of supplemental oxygen, recently increased to 2.5, but she has continued to be hypoxic in the 80s.  She reports progressive bilateral lower extremity edema and worsening dyspnea despite increasing her Lasix to 80 mg daily.  She denies fevers, chills, productive cough, or wheezing.  She denies chest pain.  She reports chronic orthopnea.     Assessment & Plan:   Principal Problem:   Acute on chronic diastolic CHF (congestive heart failure) (HCC) Active Problems:   OBSTRUCTIVE SLEEP APNEA   Diabetes mellitus (HCC)   COPD (chronic obstructive pulmonary disease) (HCC)   Mild renal insufficiency   Acute on chronic respiratory failure with hypoxia (HCC)   Acute on chronic diastolic CHF; acute on chronic hypoxic respiratory failure  - Presents with progressive bilateral leg edema and SOB with increasing supplemental O2-requirement (usually 2-2.5L)  - Found to have marked peripheral edema, mild BNP elevation, cardiomegaly and vascular congestion on CXR, no fever or productive cough, no chest pain  - Suspect acute on chronic CHF  - increase lasix to 80 IV as she was only getting 40 IV which is equivalent to home dose - echo pending -daily weight -I/Os  COPD  - No wheezing on admission  - Continue ICS/LABA and prn albuterol     Mild renal insufficiency  - SCr is 1.12 on admission; no recent labs available, was 0.8 remotely  - BMP daily while diuresing  Hypothyroidism  -  Continue Synthroid    Type II DM  - A1c was 6.9% remotely - Managed at home with Januvia and metformin, held on admission  - SSI   Obesity Body mass index is 51.47 kg/m.   OSA -CPAP while sleeping   DVT prophylaxis:  Lovenox   Code Status: Full Code   Family Communication:   Disposition Plan:     Consultants:      Subjective: Thinks her weight is down, but still with LE edema  Objective: Vitals:   01/18/18 0815 01/18/18 0900 01/18/18 1146 01/18/18 1148  BP:  131/68 134/66   Pulse:  71 72 74  Resp:   18   Temp:   97.9 F (36.6 C)   TempSrc:   Oral   SpO2: 96%  (!) 89% 91%  Weight:      Height:        Intake/Output Summary (Last 24 hours) at 01/18/2018 1249 Last data filed at 01/18/2018 0848 Gross per 24 hour  Intake 600 ml  Output 700 ml  Net -100 ml   Filed Weights   01/17/18 2154 01/18/18 0500  Weight: (!) 141.9 kg (312 lb 14.4 oz) (!) 140.3 kg (309 lb 4.8 oz)    Examination:  General exam: in bed, wearing CPAP Respiratory system: diminished, no wheezing Cardiovascular system:rrr Gastrointestinal system: +Bs, obese Central nervous system: Aler Extremities: +LE edema Skin: No rashes, lesions or ulcers Psychiatry: Judgement and insight appear normal. Mood & affect appropriate.     Data Reviewed: I have personally reviewed following labs and imaging studies  CBC:  Recent Labs  Lab 01/17/18 1738  WBC 12.7*  NEUTROABS 9.3*  HGB 14.6  HCT 48.1*  MCV 83.4  PLT 419   Basic Metabolic Panel: Recent Labs  Lab 01/17/18 1738 01/18/18 0648  NA 138 138  K 3.7 3.6  CL 94* 94*  CO2 29 33*  GLUCOSE 110* 118*  BUN 18 21*  CREATININE 1.12* 1.20*  CALCIUM 9.4 8.7*   GFR: Estimated Creatinine Clearance: 55.1 mL/min (A) (by C-G formula based on SCr of 1.2 mg/dL (H)). Liver Function Tests: No results for input(s): AST, ALT, ALKPHOS, BILITOT, PROT, ALBUMIN in the last 168 hours. No results for input(s): LIPASE, AMYLASE in the last 168  hours. No results for input(s): AMMONIA in the last 168 hours. Coagulation Profile: No results for input(s): INR, PROTIME in the last 168 hours. Cardiac Enzymes: No results for input(s): CKTOTAL, CKMB, CKMBINDEX, TROPONINI in the last 168 hours. BNP (last 3 results) No results for input(s): PROBNP in the last 8760 hours. HbA1C: No results for input(s): HGBA1C in the last 72 hours. CBG: Recent Labs  Lab 01/18/18 0735 01/18/18 1141  GLUCAP 141* 146*   Lipid Profile: No results for input(s): CHOL, HDL, LDLCALC, TRIG, CHOLHDL, LDLDIRECT in the last 72 hours. Thyroid Function Tests: No results for input(s): TSH, T4TOTAL, FREET4, T3FREE, THYROIDAB in the last 72 hours. Anemia Panel: No results for input(s): VITAMINB12, FOLATE, FERRITIN, TIBC, IRON, RETICCTPCT in the last 72 hours. Urine analysis:    Component Value Date/Time   COLORURINE YELLOW 06/25/2014 1823   APPEARANCEUR CLEAR 06/25/2014 1823   LABSPEC 1.020 06/25/2014 1823   PHURINE 5.5 06/25/2014 1823   GLUCOSEU NEGATIVE 06/25/2014 1823   HGBUR NEGATIVE 06/25/2014 1823   BILIRUBINUR NEGATIVE 06/25/2014 1823   KETONESUR NEGATIVE 06/25/2014 1823   PROTEINUR NEGATIVE 06/25/2014 1823   UROBILINOGEN 0.2 06/25/2014 1823   NITRITE NEGATIVE 06/25/2014 1823   LEUKOCYTESUR NEGATIVE 06/25/2014 1823    ) Recent Results (from the past 240 hour(s))  MRSA PCR Screening     Status: None   Collection Time: 01/18/18  6:43 AM  Result Value Ref Range Status   MRSA by PCR NEGATIVE NEGATIVE Final    Comment:        The GeneXpert MRSA Assay (FDA approved for NASAL specimens only), is one component of a comprehensive MRSA colonization surveillance program. It is not intended to diagnose MRSA infection nor to guide or monitor treatment for MRSA infections. Performed at [facility], [place] [address], [place], [place] [address]       Anti-infectives (From admission, onward)   None       Radiology Studies: Dg Chest Port 1  View  Result Date: 01/17/2018 CLINICAL DATA:  Increased swelling in the lower extremities EXAM: PORTABLE CHEST 1 VIEW COMPARISON:  07/10/2017 FINDINGS: Cardiomegaly with mild vascular congestion. Possible small left effusion. Unable to exclude left basilar atelectasis or airspace disease. Aortic atherosclerosis. No pneumothorax. IMPRESSION: 1. Cardiomegaly with mild central vascular congestion 2. Possible small left effusion. There may be atelectasis or partial consolidation in the left retrocardiac space. Electronically Signed   By: Donavan Foil M.D.   On: 01/17/2018 19:28        Scheduled Meds: . aspirin EC  162 mg Oral Daily  . calcium carbonate  1,250 mg Oral BID WC  . enoxaparin (LOVENOX) injection  40 mg Subcutaneous Q24H  . furosemide  80 mg Intravenous BID  . levothyroxine  50 mcg Oral QAC breakfast  . mometasone-formoterol  2  puff Inhalation BID  . montelukast  10 mg Oral Daily  . polyethylene glycol  17 g Oral Daily  . sodium chloride flush  3 mL Intravenous Q12H   Continuous Infusions: . sodium chloride       LOS: 1 day    Time spent: 25 min    Geradine Girt, DO [facility] Pager [phone]  If 7PM-7AM, please contact night-coverage [email or web address] Password [facility] 01/18/2018, 12:49 PM

## 2018-01-18 NOTE — Progress Notes (Signed)
  Echocardiogram 2D Echocardiogram has been performed.  [name] 01/18/2018, 4:41 PM

## 2018-01-18 NOTE — Progress Notes (Signed)
[facility] consulted via phone, Afib controlled HR in 90s, pt asymptomatic, no new orders at this time.

## 2018-01-18 NOTE — Progress Notes (Signed)
Patient has home CPAP unit at bedside. Patient placed self on and is resting comfortably at this time.

## 2018-01-19 LAB — GLUCOSE, CAPILLARY
GLUCOSE-CAPILLARY: 188 mg/dL — AB (ref 65–99)
Glucose-Capillary: 188 mg/dL — ABNORMAL HIGH (ref 65–99)
Glucose-Capillary: 187 mg/dL — ABNORMAL HIGH (ref 65–99)
Glucose-Capillary: 247 mg/dL — ABNORMAL HIGH (ref 65–99)

## 2018-01-19 LAB — BASIC METABOLIC PANEL
Anion gap: 7 (ref 5–15)
BUN: 19 mg/dL (ref 6–20)
CO2: 36 mmol/L — ABNORMAL HIGH (ref 22–32)
CREATININE: 1.14 mg/dL — AB (ref 0.44–1.00)
Calcium: 8.7 mg/dL — ABNORMAL LOW (ref 8.9–10.3)
Chloride: 96 mmol/L — ABNORMAL LOW (ref 101–111)
GFR calc Af Amer: 52 mL/min — ABNORMAL LOW (ref >60)
GFR, EST NON AFRICAN AMERICAN: 45 mL/min — AB (ref >60)
Glucose, Bld: 139 mg/dL — ABNORMAL HIGH (ref 65–99)
Potassium: 4 mmol/L (ref 3.5–5.1)
Sodium: 139 mmol/L (ref 135–145)

## 2018-01-19 LAB — CBC
HCT: 48 % — ABNORMAL HIGH (ref 36.0–46.0)
Hemoglobin: 14 g/dL (ref 12.0–15.0)
MCH: 24.6 pg — ABNORMAL LOW (ref 26.0–34.0)
MCHC: 29.2 g/dL — ABNORMAL LOW (ref 30.0–36.0)
MCV: 84.4 fL (ref 78.0–100.0)
PLATELETS: 329 10*3/uL (ref 150–400)
RBC: 5.69 MIL/uL — ABNORMAL HIGH (ref 3.87–5.11)
RDW: 16 % — AB (ref 11.5–15.5)
WBC: 11.3 10*3/uL — AB (ref 4.0–10.5)

## 2018-01-19 LAB — ECHOCARDIOGRAM COMPLETE
Height: 65 in
WEIGHTICAEL: 4948.8 [oz_av]

## 2018-01-19 LAB — UREA NITROGEN, URINE: UREA NITROGEN UR: 307 mg/dL

## 2018-01-19 MED ORDER — INSULIN ASPART 100 UNIT/ML ~~LOC~~ SOLN
10.0000 [IU] | Freq: Once | SUBCUTANEOUS | Status: AC
  Administered 2018-01-19: 10 [IU] via SUBCUTANEOUS

## 2018-01-19 MED ORDER — INSULIN ASPART 100 UNIT/ML ~~LOC~~ SOLN
0.0000 [IU] | Freq: Three times a day (TID) | SUBCUTANEOUS | Status: DC
  Administered 2018-01-19 – 2018-01-20 (×4): 2 [IU] via SUBCUTANEOUS
  Administered 2018-01-21: 3 [IU] via SUBCUTANEOUS
  Administered 2018-01-21 – 2018-01-22 (×4): 2 [IU] via SUBCUTANEOUS
  Administered 2018-01-22: 3 [IU] via SUBCUTANEOUS
  Administered 2018-01-23: 2 [IU] via SUBCUTANEOUS

## 2018-01-19 NOTE — Progress Notes (Addendum)
Per [facility] pt with 10+ beat run of "wide QRS".  Pt resting on bedside, asymptomatic.   FYI page to Dr Maylene Roes sent.  [place]. pt with 10+ wide QRS, burst of AFIB last night. Pt has remained asymptomatic.

## 2018-01-19 NOTE — Progress Notes (Signed)
Patient has her home cpap and has placed herself on at this time.

## 2018-01-19 NOTE — Progress Notes (Signed)
Call to RT to request PRN breathing treatment.

## 2018-01-19 NOTE — Progress Notes (Signed)
Sent message to [facility] asking if wanting insulin coverage for recent CBG.

## 2018-01-19 NOTE — Progress Notes (Signed)
Patient resting comfortably during shift report. Denies complaints.  

## 2018-01-19 NOTE — Progress Notes (Signed)
purewick placed

## 2018-01-19 NOTE — Progress Notes (Signed)
PROGRESS NOTE    [name]  WCH:[id number] DOB: November 17, 1940 DOA: 01/17/2018 PCP: Sharilyn Sites, MD     Brief Narrative:  [name] is a 77 y.o.femalewith medical history significant forhypothyroidism, type 2 diabetes mellitus, chronic diastolic CHF, COPD, and chronic hypoxic respiratory failure on 2.5L [place] at baseline, now presenting to the emergency department for evaluation of progressive bilateral lower extremity edema and shortness of breath. Patient reports that she was previously on 2 L/min of supplemental oxygen, recently increased to 2.5L, but she has continued to be hypoxic in the 80s. She reports progressive bilateral lower extremity edema and worsening dyspnea despite increasing her Lasix to 80 mg daily. She was admitted for acute on chronic diastolic heart failure and started on IV lasix.   Assessment & Plan:   Principal Problem:   Acute on chronic diastolic CHF (congestive heart failure) (HCC) Active Problems:   OBSTRUCTIVE SLEEP APNEA   Diabetes mellitus (HCC)   COPD (chronic obstructive pulmonary disease) (HCC)   Mild renal insufficiency   Acute on chronic respiratory failure with hypoxia (HCC)   Acute on chronic diastolic CHF -Presents with progressive bilateral leg edema and SOB with increasing supplemental O2 requirement. Found to have marked peripheral edema, mild BNP elevation, cardiomegaly and vascular congestion on CXR -Echo EF 65-70% -Continue lasix to 80mg  IV BID  -Strict I/Os, daily weight   Acute on chronic hypoxic respiratory failure -Requires 2-2.5L [place] O2 at baseline. Continue O2    COPD -Not in acute exacerbation -Continue ICS/LABA and prn albuterol  CKD stage 3 -Stable   Hypothyroidism -Continue Synthroid  Type II DM -A1c was 6.9% remotely -Managed at home with Januvia and metformin, held on admission -SSI  Morbid obesity -Body mass index is 51.47 kg/m  OSA -CPAP while sleeping    DVT prophylaxis: Lovenox Code  Status: Full Family Communication: No family at bedside Disposition Plan: Pending improvement respiratory status, volume status   Consultants:   None  Procedures:   None   Antimicrobials:  Anti-infectives (From admission, onward)   None        Subjective: Doing well, breathing is slightly improved, swelling improved.   Objective: Vitals:   01/19/18 0827 01/19/18 0908 01/19/18 1141 01/19/18 1209  BP: (!) 113/98  132/66   Pulse:   69   Resp:   18   Temp:   97.8 F (36.6 C)   TempSrc:   Oral   SpO2:  91% 93% 93%  Weight:      Height:        Intake/Output Summary (Last 24 hours) at 01/19/2018 1358 Last data filed at 01/19/2018 0958 Gross per 24 hour  Intake 553 ml  Output 2250 ml  Net -1697 ml   Filed Weights   01/17/18 2154 01/18/18 0500 01/19/18 0329  Weight: (!) 141.9 kg (312 lb 14.4 oz) (!) 140.3 kg (309 lb 4.8 oz) (!) 138.4 kg (305 lb 3.2 oz)    Examination:  General exam: Appears calm and comfortable  Respiratory system: Clear to auscultation. Respiratory effort normal. On  O2 without distress  Cardiovascular system: S1 & S2 heard, RRR. No JVD, murmurs, rubs, gallops or clicks. +1 pedal edema. Gastrointestinal system: Abdomen is nondistended, soft and nontender. No organomegaly or masses felt. Normal bowel sounds heard. Central nervous system: Alert and oriented. No focal neurological deficits. Extremities: Symmetric 5 x 5 power. Skin: No rashes, lesions or ulcers Psychiatry: Judgement and insight appear normal. Mood & affect appropriate.   Data Reviewed: I  have personally reviewed following labs and imaging studies  CBC: Recent Labs  Lab 01/17/18 1738 01/19/18 0457  WBC 12.7* 11.3*  NEUTROABS 9.3*  --   HGB 14.6 14.0  HCT 48.1* 48.0*  MCV 83.4 84.4  PLT 346 161   Basic Metabolic Panel: Recent Labs  Lab 01/17/18 1738 01/18/18 0648 01/19/18 0457  NA 138 138 139  K 3.7 3.6 4.0  CL 94* 94* 96*  CO2 29 33* 36*  GLUCOSE 110* 118* 139*    BUN 18 21* 19  CREATININE 1.12* 1.20* 1.14*  CALCIUM 9.4 8.7* 8.7*  MG  --  2.0  --    GFR: Estimated Creatinine Clearance: 57.5 mL/min (A) (by C-G formula based on SCr of 1.14 mg/dL (H)). Liver Function Tests: No results for input(s): AST, ALT, ALKPHOS, BILITOT, PROT, ALBUMIN in the last 168 hours. No results for input(s): LIPASE, AMYLASE in the last 168 hours. No results for input(s): AMMONIA in the last 168 hours. Coagulation Profile: No results for input(s): INR, PROTIME in the last 168 hours. Cardiac Enzymes: No results for input(s): CKTOTAL, CKMB, CKMBINDEX, TROPONINI in the last 168 hours. BNP (last 3 results) No results for input(s): PROBNP in the last 8760 hours. HbA1C: No results for input(s): HGBA1C in the last 72 hours. CBG: Recent Labs  Lab 01/18/18 0735 01/18/18 1141 01/18/18 2127 01/19/18 0728 01/19/18 1137  GLUCAP 141* 146* 126* 188* 188*   Lipid Profile: No results for input(s): CHOL, HDL, LDLCALC, TRIG, CHOLHDL, LDLDIRECT in the last 72 hours. Thyroid Function Tests: No results for input(s): TSH, T4TOTAL, FREET4, T3FREE, THYROIDAB in the last 72 hours. Anemia Panel: No results for input(s): VITAMINB12, FOLATE, FERRITIN, TIBC, IRON, RETICCTPCT in the last 72 hours. Sepsis Labs: No results for input(s): PROCALCITON, LATICACIDVEN in the last 168 hours.  Recent Results (from the past 240 hour(s))  MRSA PCR Screening     Status: None   Collection Time: 01/18/18  6:43 AM  Result Value Ref Range Status   MRSA by PCR NEGATIVE NEGATIVE Final    Comment:        The GeneXpert MRSA Assay (FDA approved for NASAL specimens only), is one component of a comprehensive MRSA colonization surveillance program. It is not intended to diagnose MRSA infection nor to guide or monitor treatment for MRSA infections. Performed at [facility], [place] [address], [place], [place] [address]        Radiology Studies: Dg Chest Port 1 View  Result Date:  01/17/2018 CLINICAL DATA:  Increased swelling in the lower extremities EXAM: PORTABLE CHEST 1 VIEW COMPARISON:  07/10/2017 FINDINGS: Cardiomegaly with mild vascular congestion. Possible small left effusion. Unable to exclude left basilar atelectasis or airspace disease. Aortic atherosclerosis. No pneumothorax. IMPRESSION: 1. Cardiomegaly with mild central vascular congestion 2. Possible small left effusion. There may be atelectasis or partial consolidation in the left retrocardiac space. Electronically Signed   By: Donavan Foil M.D.   On: 01/17/2018 19:28      Scheduled Meds: . aspirin EC  162 mg Oral Daily  . calcium carbonate  1,250 mg Oral BID WC  . enoxaparin (LOVENOX) injection  40 mg Subcutaneous Q24H  . furosemide  80 mg Intravenous BID  . levothyroxine  50 mcg Oral QAC breakfast  . mometasone-formoterol  2 puff Inhalation BID  . montelukast  10 mg Oral Daily  . polyethylene glycol  17 g Oral Daily  . sodium chloride flush  3 mL Intravenous Q12H   Continuous Infusions: .  sodium chloride       LOS: 2 days    Time spent: 30 minutes   Dessa Phi, DO [facility] [email or web address] Password [email or web address] 01/19/2018, 1:58 PM

## 2018-01-20 LAB — GLUCOSE, CAPILLARY
GLUCOSE-CAPILLARY: 152 mg/dL — AB (ref 65–99)
GLUCOSE-CAPILLARY: 176 mg/dL — AB (ref 65–99)
Glucose-Capillary: 186 mg/dL — ABNORMAL HIGH (ref 65–99)
Glucose-Capillary: 154 mg/dL — ABNORMAL HIGH (ref 65–99)
Glucose-Capillary: 179 mg/dL — ABNORMAL HIGH (ref 65–99)

## 2018-01-20 LAB — CBC
HCT: 47 % — ABNORMAL HIGH (ref 36.0–46.0)
Hemoglobin: 14 g/dL (ref 12.0–15.0)
MCH: 25.1 pg — AB (ref 26.0–34.0)
MCHC: 29.8 g/dL — ABNORMAL LOW (ref 30.0–36.0)
MCV: 84.4 fL (ref 78.0–100.0)
PLATELETS: 311 10*3/uL (ref 150–400)
RBC: 5.57 MIL/uL — AB (ref 3.87–5.11)
RDW: 15.7 % — AB (ref 11.5–15.5)
WBC: 12.5 10*3/uL — AB (ref 4.0–10.5)

## 2018-01-20 LAB — BASIC METABOLIC PANEL
ANION GAP: 7 (ref 5–15)
BUN: 17 mg/dL (ref 6–20)
CALCIUM: 8.7 mg/dL — AB (ref 8.9–10.3)
CO2: 37 mmol/L — AB (ref 22–32)
CREATININE: 1.03 mg/dL — AB (ref 0.44–1.00)
Chloride: 94 mmol/L — ABNORMAL LOW (ref 101–111)
GFR, EST AFRICAN AMERICAN: 59 mL/min — AB (ref >60)
GFR, EST NON AFRICAN AMERICAN: 51 mL/min — AB (ref >60)
Glucose, Bld: 143 mg/dL — ABNORMAL HIGH (ref 65–99)
Potassium: 3.8 mmol/L (ref 3.5–5.1)
Sodium: 138 mmol/L (ref 135–145)

## 2018-01-20 LAB — MAGNESIUM: Magnesium: 1.8 mg/dL (ref 1.7–2.4)

## 2018-01-20 MED ORDER — NYSTATIN 100000 UNIT/GM EX POWD
Freq: Two times a day (BID) | CUTANEOUS | Status: DC
  Administered 2018-01-21 – 2018-01-22 (×2): via TOPICAL
  Filled 2018-01-20 (×2): qty 15

## 2018-01-20 NOTE — Progress Notes (Signed)
Paged Dr. Maylene Roes to inform her pt's HR jumped up to 140 breifly, now back in the 80's.

## 2018-01-20 NOTE — Progress Notes (Deleted)
Placed wicking fabric on pt's abdomen.

## 2018-01-20 NOTE — Progress Notes (Signed)
Pt has home CPAP and is on for the night. RT will continue to monitor.

## 2018-01-20 NOTE — Progress Notes (Signed)
Dry cloths needed for moisture under pt's groin. NP paged

## 2018-01-20 NOTE — Progress Notes (Deleted)
Patient has some moisture within groin- asked on call NP for orders to apply dry clothes in between folds. Thanks

## 2018-01-20 NOTE — Progress Notes (Signed)
PROGRESS NOTE    [name]  WPY:[id number] DOB: 1941/06/13 DOA: 01/17/2018 PCP: Sharilyn Sites, MD     Brief Narrative:  [name] is a 77 y.o.femalewith medical history significant forhypothyroidism, type 2 diabetes mellitus, chronic diastolic CHF, COPD, and chronic hypoxic respiratory failure on 2.5L [place] at baseline, now presenting to the emergency department for evaluation of progressive bilateral lower extremity edema and shortness of breath. Patient reports that she was previously on 2 L/min of supplemental oxygen, recently increased to 2.5L, but she has continued to be hypoxic in the 80s. She reports progressive bilateral lower extremity edema and worsening dyspnea despite increasing her Lasix to 80 mg BID. She was admitted for acute on chronic diastolic heart failure and started on IV lasix.   Assessment & Plan:   Principal Problem:   Acute on chronic diastolic CHF (congestive heart failure) (HCC) Active Problems:   OBSTRUCTIVE SLEEP APNEA   Diabetes mellitus (HCC)   COPD (chronic obstructive pulmonary disease) (HCC)   Mild renal insufficiency   Acute on chronic respiratory failure with hypoxia (HCC)   Acute on chronic diastolic CHF -Presents with progressive bilateral leg edema and SOB with increasing supplemental O2 requirement. Found to have marked peripheral edema, mild BNP elevation, cardiomegaly and vascular congestion on CXR -Echo EF 65-70% -Continue lasix to 80mg  IV BID  -Strict I/Os, daily weight  -Net -2.1L   Acute on chronic hypoxic respiratory failure -Requires 2-2.5L [place] O2 at baseline. Continue O2    COPD -Not in acute exacerbation -Continue ICS/LABA and prn albuterol  CKD stage 3 -Stable   Hypothyroidism -Continue Synthroid  Type II DM -A1c was 6.9% remotely -Managed at home with Januvia and metformin, held on admission -SSI  Morbid obesity -Body mass index is 51.47 kg/m  OSA -CPAP while sleeping    DVT prophylaxis:  Lovenox Code Status: Full Family Communication: No family at bedside Disposition Plan: Pending improvement respiratory status, volume status   Consultants:   None  Procedures:   None   Antimicrobials:  Anti-infectives (From admission, onward)   None       Subjective: Sitting in chair, brushing teeth.  She states that her breathing and her peripheral edema has improved.  No chest pain.  Objective: Vitals:   01/19/18 2100 01/20/18 0330 01/20/18 0737 01/20/18 0829  BP: (!) 112/51 135/73  127/70  Pulse: 73 73  70  Resp: 18 18    Temp: 98.3 F (36.8 C) 98.7 F (37.1 C)    TempSrc: Oral Oral    SpO2: 93% 90% 92% 90%  Weight:  (!) 141.1 kg (311 lb 1.1 oz)    Height:        Intake/Output Summary (Last 24 hours) at 01/20/2018 1228 Last data filed at 01/20/2018 0930 Gross per 24 hour  Intake 1016 ml  Output 1377 ml  Net -361 ml   Filed Weights   01/18/18 0500 01/19/18 0329 01/20/18 0330  Weight: (!) 140.3 kg (309 lb 4.8 oz) (!) 138.4 kg (305 lb 3.2 oz) (!) 141.1 kg (311 lb 1.1 oz)    Examination: General exam: Appears calm and comfortable  Respiratory system: Crackles bilateral base.  Respiratory effort normal. Cardiovascular system: S1 & S2 heard, RRR. No JVD, murmurs, rubs, gallops or clicks. +1 pedal edema. Gastrointestinal system: Abdomen is nondistended, soft and nontender. No organomegaly or masses felt. Normal bowel sounds heard. Central nervous system: Alert and oriented. No focal neurological deficits. Extremities: Symmetric 5 x 5 power. Skin: No rashes, lesions  or ulcers Psychiatry: Judgement and insight appear normal. Mood & affect appropriate.    Data Reviewed: I have personally reviewed following labs and imaging studies  CBC: Recent Labs  Lab 01/17/18 1738 01/19/18 0457 01/20/18 0717  WBC 12.7* 11.3* 12.5*  NEUTROABS 9.3*  --   --   HGB 14.6 14.0 14.0  HCT 48.1* 48.0* 47.0*  MCV 83.4 84.4 84.4  PLT 346 329 983   Basic Metabolic  Panel: Recent Labs  Lab 01/17/18 1738 01/18/18 0648 01/19/18 0457 01/20/18 0717  NA 138 138 139 138  K 3.7 3.6 4.0 3.8  CL 94* 94* 96* 94*  CO2 29 33* 36* 37*  GLUCOSE 110* 118* 139* 143*  BUN 18 21* 19 17  CREATININE 1.12* 1.20* 1.14* 1.03*  CALCIUM 9.4 8.7* 8.7* 8.7*  MG  --  2.0  --  1.8   GFR: Estimated Creatinine Clearance: 64.4 mL/min (A) (by C-G formula based on SCr of 1.03 mg/dL (H)). Liver Function Tests: No results for input(s): AST, ALT, ALKPHOS, BILITOT, PROT, ALBUMIN in the last 168 hours. No results for input(s): LIPASE, AMYLASE in the last 168 hours. No results for input(s): AMMONIA in the last 168 hours. Coagulation Profile: No results for input(s): INR, PROTIME in the last 168 hours. Cardiac Enzymes: No results for input(s): CKTOTAL, CKMB, CKMBINDEX, TROPONINI in the last 168 hours. BNP (last 3 results) No results for input(s): PROBNP in the last 8760 hours. HbA1C: No results for input(s): HGBA1C in the last 72 hours. CBG: Recent Labs  Lab 01/19/18 1137 01/19/18 1550 01/19/18 2058 01/20/18 0759 01/20/18 1147  GLUCAP 188* 187* 247* 152* 154*   Lipid Profile: No results for input(s): CHOL, HDL, LDLCALC, TRIG, CHOLHDL, LDLDIRECT in the last 72 hours. Thyroid Function Tests: No results for input(s): TSH, T4TOTAL, FREET4, T3FREE, THYROIDAB in the last 72 hours. Anemia Panel: No results for input(s): VITAMINB12, FOLATE, FERRITIN, TIBC, IRON, RETICCTPCT in the last 72 hours. Sepsis Labs: No results for input(s): PROCALCITON, LATICACIDVEN in the last 168 hours.  Recent Results (from the past 240 hour(s))  MRSA PCR Screening     Status: None   Collection Time: 01/18/18  6:43 AM  Result Value Ref Range Status   MRSA by PCR NEGATIVE NEGATIVE Final    Comment:        The GeneXpert MRSA Assay (FDA approved for NASAL specimens only), is one component of a comprehensive MRSA colonization surveillance program. It is not intended to diagnose  MRSA infection nor to guide or monitor treatment for MRSA infections. Performed at [facility], [place] [address], [place], [place] [address]        Radiology Studies: No results found.    Scheduled Meds: . aspirin EC  162 mg Oral Daily  . calcium carbonate  1,250 mg Oral BID WC  . enoxaparin (LOVENOX) injection  40 mg Subcutaneous Q24H  . furosemide  80 mg Intravenous BID  . insulin aspart  0-9 Units Subcutaneous TID WC  . levothyroxine  50 mcg Oral QAC breakfast  . mometasone-formoterol  2 puff Inhalation BID  . montelukast  10 mg Oral Daily  . polyethylene glycol  17 g Oral Daily  . sodium chloride flush  3 mL Intravenous Q12H   Continuous Infusions: . sodium chloride       LOS: 3 days    Time spent: 30 minutes   Dessa Phi, DO [facility] [email or web address] Password [facility] 01/20/2018, 12:28 PM

## 2018-01-21 LAB — BASIC METABOLIC PANEL
ANION GAP: 9 (ref 5–15)
BUN: 15 mg/dL (ref 6–20)
CO2: 39 mmol/L — AB (ref 22–32)
CREATININE: 1 mg/dL (ref 0.44–1.00)
Calcium: 8.6 mg/dL — ABNORMAL LOW (ref 8.9–10.3)
Chloride: 88 mmol/L — ABNORMAL LOW (ref 101–111)
GFR, EST NON AFRICAN AMERICAN: 53 mL/min — AB (ref >60)
GLUCOSE: 185 mg/dL — AB (ref 65–99)
Potassium: 3.2 mmol/L — ABNORMAL LOW (ref 3.5–5.1)
Sodium: 136 mmol/L (ref 135–145)

## 2018-01-21 LAB — GLUCOSE, CAPILLARY
GLUCOSE-CAPILLARY: 175 mg/dL — AB (ref 65–99)
GLUCOSE-CAPILLARY: 244 mg/dL — AB (ref 65–99)
GLUCOSE-CAPILLARY: 169 mg/dL — AB (ref 65–99)
Glucose-Capillary: 160 mg/dL — ABNORMAL HIGH (ref 65–99)

## 2018-01-21 LAB — CBC
HCT: 45.7 % (ref 36.0–46.0)
Hemoglobin: 13.7 g/dL (ref 12.0–15.0)
MCH: 25.4 pg — AB (ref 26.0–34.0)
MCHC: 30 g/dL (ref 30.0–36.0)
MCV: 84.6 fL (ref 78.0–100.0)
PLATELETS: 304 10*3/uL (ref 150–400)
RBC: 5.4 MIL/uL — ABNORMAL HIGH (ref 3.87–5.11)
RDW: 16 % — AB (ref 11.5–15.5)
WBC: 11.9 10*3/uL — AB (ref 4.0–10.5)

## 2018-01-21 LAB — MAGNESIUM: Magnesium: 1.8 mg/dL (ref 1.7–2.4)

## 2018-01-21 MED ORDER — POTASSIUM CHLORIDE CRYS ER 20 MEQ PO TBCR
40.0000 meq | EXTENDED_RELEASE_TABLET | ORAL | Status: AC
Start: 2018-01-21 — End: 2018-01-21
  Administered 2018-01-21 (×2): 40 meq via ORAL
  Filled 2018-01-21 (×2): qty 2

## 2018-01-21 NOTE — Progress Notes (Signed)
PROGRESS NOTE    [name]  PPI:[id number] DOB: 1941-01-29 DOA: 01/17/2018 PCP: Sharilyn Sites, MD     Brief Narrative:  [name] is a 77 y.o.femalewith medical history significant forhypothyroidism, type 2 diabetes mellitus, chronic diastolic CHF, COPD, and chronic hypoxic respiratory failure on 2.5L [place] at baseline, now presenting to the emergency department for evaluation of progressive bilateral lower extremity edema and shortness of breath. Patient reports that she was previously on 2 L/min of supplemental oxygen, recently increased to 2.5L, but she has continued to be hypoxic in the 80s. She reports progressive bilateral lower extremity edema and worsening dyspnea despite increasing her Lasix to 80 mg BID. She was admitted for acute on chronic diastolic heart failure and started on IV lasix.   Assessment & Plan:   Principal Problem:   Acute on chronic diastolic CHF (congestive heart failure) (HCC) Active Problems:   OBSTRUCTIVE SLEEP APNEA   Diabetes mellitus (HCC)   COPD (chronic obstructive pulmonary disease) (HCC)   Mild renal insufficiency   Acute on chronic respiratory failure with hypoxia (HCC)   Acute on chronic diastolic CHF -Presents with progressive bilateral leg edema and SOB with increasing supplemental O2 requirement. Found to have marked peripheral edema, mild BNP elevation, cardiomegaly and vascular congestion on CXR -Echo EF 65-70% -Continue lasix to 80mg  IV BID  -Strict I/Os, daily weight  -Net -2.6L   Acute on chronic hypoxic respiratory failure -Requires 2-2.5L [place] O2 at baseline. Continue O2    COPD -Not in acute exacerbation -Continue ICS/LABA and prn albuterol  CKD stage 3 -Stable   Hypothyroidism -Continue Synthroid  Type II DM -A1c was 6.9% remotely -Managed at home with Januvia and metformin, held on admission -SSI  Morbid obesity -Body mass index is 51.47 kg/m  OSA -CPAP while sleeping    Hypokalemia -Replace, trend    DVT prophylaxis: Lovenox Code Status: Full Family Communication: No family at bedside Disposition Plan: Pending improvement respiratory status, volume status   Consultants:   None  Procedures:   None   Antimicrobials:  Anti-infectives (From admission, onward)   None       Subjective: States that she was short of breath this morning.  Feeling much better now.  Legs continue to be edematous, improved from admission, denies any chest pain  Objective: Vitals:   01/20/18 1243 01/20/18 2128 01/21/18 0453 01/21/18 0940  BP: 131/68 134/69 123/69   Pulse: 71 73 67   Resp: 18 (!) 24 18   Temp: 98 F (36.7 C) 98.4 F (36.9 C) (!) 97.5 F (36.4 C)   TempSrc: Oral Oral Oral   SpO2: 95% (!) 85% 95% 95%  Weight:   (!) 139.8 kg (308 lb 4.8 oz)   Height:        Intake/Output Summary (Last 24 hours) at 01/21/2018 1248 Last data filed at 01/21/2018 1213 Gross per 24 hour  Intake 1123 ml  Output 1600 ml  Net -477 ml   Filed Weights   01/19/18 0329 01/20/18 0330 01/21/18 0453  Weight: (!) 138.4 kg (305 lb 3.2 oz) (!) 141.1 kg (311 lb 1.1 oz) (!) 139.8 kg (308 lb 4.8 oz)    Examination: General exam: Appears calm and comfortable  Respiratory system: Clear to auscultation. Respiratory effort normal. Cardiovascular system: S1 & S2 heard, RRR. No JVD, murmurs, rubs, gallops or clicks. +1-2 pedal edema. Gastrointestinal system: Abdomen is nondistended, soft and nontender. No organomegaly or masses felt. Normal bowel sounds heard. Central nervous system: Alert and  oriented. No focal neurological deficits. Extremities: Symmetric 5 x 5 power. Skin: No rashes, lesions or ulcers Psychiatry: Judgement and insight appear normal. Mood & affect appropriate.   Data Reviewed: I have personally reviewed following labs and imaging studies  CBC: Recent Labs  Lab 01/17/18 1738 01/19/18 0457 01/20/18 0717 01/21/18 0710  WBC 12.7* 11.3* 12.5* 11.9*   NEUTROABS 9.3*  --   --   --   HGB 14.6 14.0 14.0 13.7  HCT 48.1* 48.0* 47.0* 45.7  MCV 83.4 84.4 84.4 84.6  PLT 346 329 311 962   Basic Metabolic Panel: Recent Labs  Lab 01/17/18 1738 01/18/18 0648 01/19/18 0457 01/20/18 0717 01/21/18 0710  NA 138 138 139 138 136  K 3.7 3.6 4.0 3.8 3.2*  CL 94* 94* 96* 94* 88*  CO2 29 33* 36* 37* 39*  GLUCOSE 110* 118* 139* 143* 185*  BUN 18 21* 19 17 15   CREATININE 1.12* 1.20* 1.14* 1.03* 1.00  CALCIUM 9.4 8.7* 8.7* 8.7* 8.6*  MG  --  2.0  --  1.8 1.8   GFR: Estimated Creatinine Clearance: 65.9 mL/min (by C-G formula based on SCr of 1 mg/dL). Liver Function Tests: No results for input(s): AST, ALT, ALKPHOS, BILITOT, PROT, ALBUMIN in the last 168 hours. No results for input(s): LIPASE, AMYLASE in the last 168 hours. No results for input(s): AMMONIA in the last 168 hours. Coagulation Profile: No results for input(s): INR, PROTIME in the last 168 hours. Cardiac Enzymes: No results for input(s): CKTOTAL, CKMB, CKMBINDEX, TROPONINI in the last 168 hours. BNP (last 3 results) No results for input(s): PROBNP in the last 8760 hours. HbA1C: No results for input(s): HGBA1C in the last 72 hours. CBG: Recent Labs  Lab 01/20/18 1147 01/20/18 1627 01/20/18 2156 01/21/18 0747 01/21/18 1215  GLUCAP 154* 179* 176* 175* 244*   Lipid Profile: No results for input(s): CHOL, HDL, LDLCALC, TRIG, CHOLHDL, LDLDIRECT in the last 72 hours. Thyroid Function Tests: No results for input(s): TSH, T4TOTAL, FREET4, T3FREE, THYROIDAB in the last 72 hours. Anemia Panel: No results for input(s): VITAMINB12, FOLATE, FERRITIN, TIBC, IRON, RETICCTPCT in the last 72 hours. Sepsis Labs: No results for input(s): PROCALCITON, LATICACIDVEN in the last 168 hours.  Recent Results (from the past 240 hour(s))  MRSA PCR Screening     Status: None   Collection Time: 01/18/18  6:43 AM  Result Value Ref Range Status   MRSA by PCR NEGATIVE NEGATIVE Final    Comment:         The GeneXpert MRSA Assay (FDA approved for NASAL specimens only), is one component of a comprehensive MRSA colonization surveillance program. It is not intended to diagnose MRSA infection nor to guide or monitor treatment for MRSA infections. Performed at [facility], [place] [address], [place], [place] [address]        Radiology Studies: No results found.    Scheduled Meds: . aspirin EC  162 mg Oral Daily  . calcium carbonate  1,250 mg Oral BID WC  . enoxaparin (LOVENOX) injection  40 mg Subcutaneous Q24H  . furosemide  80 mg Intravenous BID  . insulin aspart  0-9 Units Subcutaneous TID WC  . levothyroxine  50 mcg Oral QAC breakfast  . mometasone-formoterol  2 puff Inhalation BID  . montelukast  10 mg Oral Daily  . nystatin   Topical BID  . polyethylene glycol  17 g Oral Daily  . potassium chloride  40 mEq Oral Q4H  . sodium chloride flush  3 mL Intravenous Q12H   Continuous Infusions: . sodium chloride       LOS: 4 days    Time spent: 30 minutes   Dessa Phi, DO [facility] [email or web address] Password [facility] 01/21/2018, 12:48 PM

## 2018-01-21 NOTE — Progress Notes (Signed)
Physical Therapy Treatment Patient Details Name: [name] MRN: [id number] DOB: [birth year] Today's Date: 01/21/2018    History of Present Illness  [name] is a 77 y.o. female with medical history significant for hypothyroidism, type 2 diabetes mellitus, chronic diastolic CHF, COPD, and chronic hypoxic respiratory failure    PT Comments    Pt demonstrates gait instability, difficulty with bed mobility, and difficulty navigating stairs safely. Pt would benefit from continued Acute PT in order to improve functional mobility and independence level. Gait and stair training as well as bed mobility performed today   Follow Up Recommendations  No PT follow up     Equipment Recommendations  None recommended by PT    Recommendations for Other Services       Precautions / Restrictions Precautions Precautions: None Restrictions Weight Bearing Restrictions: No    Mobility  Bed Mobility Overal bed mobility: Needs Assistance Bed Mobility: Sit to Supine;Supine to Sit     Supine to sit: HOB elevated;Min assist(Pt used R upper bed rail) Sit to supine: Min guard(Increased time taken and pt used upper R bed rail and increased time required to complete)   General bed mobility comments: HHA used for supine>Sit; Trendelenburg and PT total assist used for repositioning in bed after pt assumed supine position  Transfers Overall transfer level: Needs assistance Equipment used: Rolling walker (2 wheeled) Transfers: Sit to/from Stand Sit to Stand: Supervision         General transfer comment: Pt requires cueing for hand placement when standing and sitting; Pt plops upon sitting  Ambulation/Gait Ambulation/Gait assistance: Min guard Ambulation Distance (Feet): 25 Feet Assistive device: Rolling walker (2 wheeled) Gait Pattern/deviations: Step-through pattern;Wide base of support   Gait velocity interpretation: <1.8 ft/sec, indicate of risk for recurrent falls General Gait Details:  Guarding for safety due to instability   Stairs Stairs: Yes Stairs assistance: Min guard Stair Management: One rail Right(Rail on R ascending, rail on L descending) Number of Stairs: 2 General stair comments: Pt reports using RW forward to ascend stairs at home while caregiver supports RW. Pt educated about ways to navigate stairs with safer technique including backwards, laterally, and using 1 rail and RW folded on contralateral side   Wheelchair Mobility    Modified Rankin (Stroke Patients Only)       Balance Overall balance assessment: Needs assistance Sitting-balance support: No upper extremity supported;Feet supported Sitting balance-Leahy Scale: Good     Standing balance support: Bilateral upper extremity supported Standing balance-Leahy Scale: Fair Standing balance comment: RW and CGA for static standing                            Cognition Arousal/Alertness: Awake/alert Behavior During Therapy: WFL for tasks assessed/performed Overall Cognitive Status: Within Functional Limits for tasks assessed                                        Exercises      General Comments General comments (skin integrity, edema, etc.): At rest pt with 3 L O2 WNL      Pertinent Vitals/Pain Pain Assessment: No/denies pain    Home Living                      Prior Function            PT Goals (current  goals can now be found in the care plan section) Acute Rehab PT Goals Patient Stated Goal: return home PT Goal Formulation: With patient Time For Goal Achievement: 02/01/18 Potential to Achieve Goals: Good    Frequency    Min 3X/week      PT Plan      Co-evaluation              AM-PAC PT "6 Clicks" Daily Activity  Outcome Measure  Difficulty turning over in bed (including adjusting bedclothes, sheets and blankets)?: Unable Difficulty moving from lying on back to sitting on the side of the bed? : Unable Difficulty sitting  down on and standing up from a chair with arms (e.g., wheelchair, bedside commode, etc,.)?: A Little Help needed moving to and from a bed to chair (including a wheelchair)?: A Little Help needed walking in hospital room?: A Little Help needed climbing 3-5 steps with a railing? : A Lot(2 Stairs ascended/descended today) 6 Click Score: 13    End of Session Equipment Utilized During Treatment: Gait belt Activity Tolerance: Patient tolerated treatment well Patient left: in bed;with bed alarm set;with call bell/phone within reach Nurse Communication: Mobility status PT Visit Diagnosis: Unsteadiness on feet (R26.81);Other abnormalities of gait and mobility (R26.89);Muscle weakness (generalized) (M62.81)     Time: 8366-2947 PT Time Calculation (min) (ACUTE ONLY): 34 min  Charges:  $Gait Training: 8-22 mins $Therapeutic Activity: 8-22 mins                    G Codes:       Gabe Jaye Polidori SPT   [employer] 01/21/2018, 1:32 PM

## 2018-01-21 NOTE — Care Management Important Message (Signed)
Important Message  Patient Details  Name: [name] MRN: [id number] Date of Birth: 1940-12-28   Medicare Important Message Given:  Yes    Henri Baumler Montine Circle 01/21/2018, 11:56 AM

## 2018-01-22 DIAGNOSIS — E118 Type 2 diabetes mellitus with unspecified complications: Secondary | ICD-10-CM

## 2018-01-22 DIAGNOSIS — I5033 Acute on chronic diastolic (congestive) heart failure: Secondary | ICD-10-CM

## 2018-01-22 DIAGNOSIS — G4733 Obstructive sleep apnea (adult) (pediatric): Secondary | ICD-10-CM

## 2018-01-22 DIAGNOSIS — J9621 Acute and chronic respiratory failure with hypoxia: Secondary | ICD-10-CM

## 2018-01-22 DIAGNOSIS — J449 Chronic obstructive pulmonary disease, unspecified: Secondary | ICD-10-CM

## 2018-01-22 LAB — GLUCOSE, CAPILLARY
GLUCOSE-CAPILLARY: 195 mg/dL — AB (ref 65–99)
Glucose-Capillary: 161 mg/dL — ABNORMAL HIGH (ref 65–99)
Glucose-Capillary: 204 mg/dL — ABNORMAL HIGH (ref 65–99)
Glucose-Capillary: 157 mg/dL — ABNORMAL HIGH (ref 65–99)

## 2018-01-22 LAB — CBC
HCT: 47.6 % — ABNORMAL HIGH (ref 36.0–46.0)
Hemoglobin: 14.6 g/dL (ref 12.0–15.0)
MCH: 25.7 pg — AB (ref 26.0–34.0)
MCHC: 30.7 g/dL (ref 30.0–36.0)
MCV: 83.8 fL (ref 78.0–100.0)
PLATELETS: 332 10*3/uL (ref 150–400)
RBC: 5.68 MIL/uL — ABNORMAL HIGH (ref 3.87–5.11)
RDW: 15.7 % — ABNORMAL HIGH (ref 11.5–15.5)
WBC: 14.9 10*3/uL — AB (ref 4.0–10.5)

## 2018-01-22 LAB — BASIC METABOLIC PANEL
Anion gap: 11 (ref 5–15)
BUN: 14 mg/dL (ref 6–20)
CHLORIDE: 90 mmol/L — AB (ref 101–111)
CO2: 37 mmol/L — AB (ref 22–32)
CREATININE: 0.94 mg/dL (ref 0.44–1.00)
Calcium: 8.7 mg/dL — ABNORMAL LOW (ref 8.9–10.3)
GFR calc Af Amer: >60 mL/min (ref >60)
GFR calc non Af Amer: 57 mL/min — ABNORMAL LOW (ref >60)
Glucose, Bld: 171 mg/dL — ABNORMAL HIGH (ref 65–99)
Potassium: 3.8 mmol/L (ref 3.5–5.1)
SODIUM: 138 mmol/L (ref 135–145)

## 2018-01-22 LAB — MAGNESIUM: Magnesium: 1.8 mg/dL (ref 1.7–2.4)

## 2018-01-22 NOTE — Care Management Note (Signed)
Case Management Note  Patient Details  Name: [name] MRN: [id number] Date of Birth: [birth year]  Subjective/Objective:                 Patient is active with Personal Care services through [facility]. She receives almost 24/7 around the clock care through a home health aid. This is through separate long term care insurance. HH will not disrupt current PCS and would have to necessarily be [facility] per Tommi Rumps, clinical liaison [facility].   Action/Plan:   Expected Discharge Date:  01/21/18               Expected Discharge Plan:  [place]  In-House Referral:     Discharge planning Services  CM Consult  Post Acute Care Choice:    Choice offered to:     DME Arranged:    DME Agency:     HH Arranged:    HH Agency:     Status of Service:  In process, will continue to follow  If discussed at Long Length of Stay Meetings, dates discussed:    Additional Comments:  Carles Collet, RN 01/22/2018, 11:36 AM

## 2018-01-22 NOTE — Progress Notes (Signed)
PROGRESS NOTE    [name]  RSW:[id number] DOB: [birth year] DOA: 01/17/2018 PCP: Sharilyn Sites, MD     Brief Narrative:  [name] is a 77 y.o.femalewith medical history significant forhypothyroidism, type 2 diabetes mellitus, chronic diastolic CHF, COPD, and chronic hypoxic respiratory failure on 2.5L [place] at baseline, now presenting to the emergency department for evaluation of progressive bilateral lower extremity edema and shortness of breath. Patient reports that she was previously on 2 L/min of supplemental oxygen, recently increased to 2.5L, but she has continued to be hypoxic in the 80s. She reports progressive bilateral lower extremity edema and worsening dyspnea despite increasing her Lasix to 80 mg BID. She was admitted for acute on chronic diastolic heart failure and started on IV lasix.   Assessment & Plan:   Principal Problem:   Acute on chronic diastolic CHF (congestive heart failure) (HCC) Active Problems:   OBSTRUCTIVE SLEEP APNEA   Diabetes mellitus (HCC)   COPD (chronic obstructive pulmonary disease) (HCC)   Mild renal insufficiency   Acute on chronic respiratory failure with hypoxia (HCC)  Acute on chronic diastolic CHF: Presents with progressive bilateral leg edema and SOB with increasing supplemental O2 requirement. Found to have marked peripheral edema, mild BNP elevation, cardiomegaly and vascular congestion on CXR. Echo EF 65-70% - Continue lasix to 80mg  IV BID another day, likely to convert to po 5/2 if stable.  - Strict I/Os, daily weight. Overall below what was previously suspected to be EDW (315lbs at outpatient cardiology visits). Down 312lbs > 305lbs now.  - Continues to have good diuresis with improving renal function.  Acute on chronic hypoxic respiratory failure: Requires 2-2.5L [place] O2 at baseline.  - Attempt to wean to 2-2.5L[place] today    COPD: Stable, no exacerbation. - Continue ICS/LABA and prn albuterol  Stage III CKD: Creatinine stable,  actually improving with diuresis.  - Monitor BMP in AM.   Hypothyroidism: Last TSH wnl. - Continue synthroid  Type II DM: HbA1c was 6.9% remotely - Holding home januvia, metformin - Continue SSI.   Morbid obesity: Body mass index is 51.47 kg/m  OSA - CPAP qHS  Hypokalemia - Replaced, now wnl. Recheck w/Mag in AM given ongoing diuresis  DVT prophylaxis: Lovenox Code Status: Full Family Communication: No family at bedside Disposition Plan: [place] home 5/2 if continues current rate of improvement Consultants: None Procedures: None   Subjective: Dyspnea has improved over past 24 hours but still with LE swelling worse than baseline. No chest pain, palpitations, presyncope  Objective: Vitals:   01/22/18 0340 01/22/18 0810 01/22/18 0844 01/22/18 1125  BP: 136/86  133/70 (!) 104/52  Pulse: 68 69 65 65  Resp: 18 17  18   Temp: 97.7 F (36.5 C)   97.7 F (36.5 C)  TempSrc: Oral   Oral  SpO2: 94% 93%  90%  Weight: (!) 138.5 kg (305 lb 6.4 oz)     Height:        Intake/Output Summary (Last 24 hours) at 01/22/2018 1827 Last data filed at 01/22/2018 0958 Gross per 24 hour  Intake 290 ml  Output 1000 ml  Net -710 ml   Filed Weights   01/20/18 0330 01/21/18 0453 01/22/18 0340  Weight: (!) 141.1 kg (311 lb 1.1 oz) (!) 139.8 kg (308 lb 4.8 oz) (!) 138.5 kg (305 lb 6.4 oz)    Examination: BP (!) 104/52 (BP Location: Left Wrist)   Pulse 65   Temp 97.7 F (36.5 C) (Oral)   Resp  18   Ht 5\' 5"  (1.651 m)   Wt (!) 138.5 kg (305 lb 6.4 oz) Comment: scale c  SpO2 90%   BMI 50.82 kg/m  Gen: Well-appearing 77 y.o.female in NAD Neck: No JVD, supple HEENT: MMM, posterior oropharynx clear Pulm: Non-labored, bibasilar crackles appreciated CV: Regular rate, no murmur appreciated; distal pulses intact/symmetric. 1+ pitting LE edema.  GI: + BS; soft, non-tender, non-distended Skin: No rashes, wounds, ulcers Neuro: A&Ox3, CN II-XII without deficits  Data Reviewed: I  have personally reviewed following labs and imaging studies  CBC: Recent Labs  Lab 01/17/18 1738 01/19/18 0457 01/20/18 0717 01/21/18 0710 01/22/18 0403  WBC 12.7* 11.3* 12.5* 11.9* 14.9*  NEUTROABS 9.3*  --   --   --   --   HGB 14.6 14.0 14.0 13.7 14.6  HCT 48.1* 48.0* 47.0* 45.7 47.6*  MCV 83.4 84.4 84.4 84.6 83.8  PLT 346 329 311 304 381   Basic Metabolic Panel: Recent Labs  Lab 01/18/18 0648 01/19/18 0457 01/20/18 0717 01/21/18 0710 01/22/18 0403  NA 138 139 138 136 138  K 3.6 4.0 3.8 3.2* 3.8  CL 94* 96* 94* 88* 90*  CO2 33* 36* 37* 39* 37*  GLUCOSE 118* 139* 143* 185* 171*  BUN 21* 19 17 15 14   CREATININE 1.20* 1.14* 1.03* 1.00 0.94  CALCIUM 8.7* 8.7* 8.7* 8.6* 8.7*  MG 2.0  --  1.8 1.8 1.8   Time spent: 25 minutes   Vance Gather, MD 01/22/2018 6:27 PM   [facility] [email or web address] Password [email or web address]

## 2018-01-23 LAB — BASIC METABOLIC PANEL
Anion gap: 9 (ref 5–15)
BUN: 15 mg/dL (ref 6–20)
CO2: 37 mmol/L — ABNORMAL HIGH (ref 22–32)
CREATININE: 0.91 mg/dL (ref 0.44–1.00)
Calcium: 8.5 mg/dL — ABNORMAL LOW (ref 8.9–10.3)
Chloride: 92 mmol/L — ABNORMAL LOW (ref 101–111)
GFR calc Af Amer: >60 mL/min (ref >60)
GFR, EST NON AFRICAN AMERICAN: 59 mL/min — AB (ref >60)
Glucose, Bld: 139 mg/dL — ABNORMAL HIGH (ref 65–99)
POTASSIUM: 3.6 mmol/L (ref 3.5–5.1)
SODIUM: 138 mmol/L (ref 135–145)

## 2018-01-23 LAB — GLUCOSE, CAPILLARY
GLUCOSE-CAPILLARY: 169 mg/dL — AB (ref 65–99)
Glucose-Capillary: 233 mg/dL — ABNORMAL HIGH (ref 65–99)

## 2018-01-23 LAB — MAGNESIUM: Magnesium: 1.9 mg/dL (ref 1.7–2.4)

## 2018-01-23 MED ORDER — TRIAMCINOLONE ACETONIDE 0.025 % EX CREA: 1.0000 "application " | TOPICAL_CREAM | Freq: Two times a day (BID) | CUTANEOUS | 0 refills | Status: DC

## 2018-01-23 NOTE — Progress Notes (Signed)
Physical Therapy Treatment Patient Details Name: [name] MRN: [id number] DOB: Oct 17, 1940 Today's Date: 01/23/2018    History of Present Illness  [name] is a 77 y.o. female with medical history significant for hypothyroidism, type 2 diabetes mellitus, chronic diastolic CHF, COPD, and chronic hypoxic respiratory failure    PT Comments    Patient frustrated upon entry and reports she does not want to ambulate in hallway today. SpO2=80% on RA upon entry, patient reports she has been sitting on BSC for roughly an hour. Patient tolerating standing for pericare with RW without guarding. Ambulated with close supervision and intermittent min guard for 20' on 2.5L and remained > 92% SpO2. Pt reports she is moving near baseline today and eager to return home where she has caregiver support. Advised patient to receive physical assiatnce guarding for all OOB mobility upon home, patient agreeable and reports she will.   Follow Up Recommendations  No PT follow up     Equipment Recommendations  None recommended by PT    Recommendations for Other Services       Precautions / Restrictions Precautions Precautions: None Restrictions Weight Bearing Restrictions: No    Mobility  Bed Mobility Overal bed mobility: Needs Assistance Bed Mobility: Sit to Supine;Supine to Sit       Sit to supine: Min assist   General bed mobility comments: min A to elevate legs over EOB. patient able to position herself into bed.   Transfers Overall transfer level: Needs assistance Equipment used: Rolling walker (2 wheeled) Transfers: Sit to/from Stand Sit to Stand: Supervision         General transfer comment: pt transfreed from [facility] to bed, tolerated standing for 3 minutes for pericare.   Ambulation/Gait Ambulation/Gait assistance: Min guard Ambulation Distance (Feet): 20 Feet Assistive device: Rolling walker (2 wheeled) Gait Pattern/deviations: Step-through pattern;Wide base of support Gait  velocity: decr   General Gait Details: Guarding for safety due to instability   Stairs             Wheelchair Mobility    Modified Rankin (Stroke Patients Only)       Balance Overall balance assessment: Needs assistance Sitting-balance support: No upper extremity supported;Feet supported Sitting balance-Leahy Scale: Good     Standing balance support: Bilateral upper extremity supported Standing balance-Leahy Scale: Fair Standing balance comment: RW and CGA for static standing                            Cognition Arousal/Alertness: Awake/alert Behavior During Therapy: WFL for tasks assessed/performed Overall Cognitive Status: Within Functional Limits for tasks assessed                                        Exercises      General Comments        Pertinent Vitals/Pain Pain Assessment: No/denies pain    Home Living                      Prior Function            PT Goals (current goals can now be found in the care plan section) Acute Rehab PT Goals Patient Stated Goal: return home PT Goal Formulation: With patient Time For Goal Achievement: 02/01/18 Potential to Achieve Goals: Good Progress towards PT goals: Progressing toward goals    Frequency  Min 3X/week      PT Plan Current plan remains appropriate    Co-evaluation              AM-PAC PT "6 Clicks" Daily Activity  Outcome Measure  Difficulty turning over in bed (including adjusting bedclothes, sheets and blankets)?: Unable Difficulty moving from lying on back to sitting on the side of the bed? : Unable Difficulty sitting down on and standing up from a chair with arms (e.g., wheelchair, bedside commode, etc,.)?: A Little Help needed moving to and from a bed to chair (including a wheelchair)?: A Little Help needed walking in hospital room?: A Little Help needed climbing 3-5 steps with a railing? : A Lot 6 Click Score: 13    End of Session  Equipment Utilized During Treatment: Gait belt;Oxygen Activity Tolerance: Patient tolerated treatment well Patient left: in bed;with call bell/phone within reach Nurse Communication: Mobility status PT Visit Diagnosis: Unsteadiness on feet (R26.81);Other abnormalities of gait and mobility (R26.89);Muscle weakness (generalized) (M62.81)     Time: 9323-5573 PT Time Calculation (min) (ACUTE ONLY): 29 min  Charges:  $Gait Training: 8-22 mins $Therapeutic Activity: 8-22 mins                    G Codes:       Reinaldo Berber, PT, DPT Acute Rehab Services Pager: [phone]     Reinaldo Berber 01/23/2018, 11:16 AM

## 2018-01-23 NOTE — Discharge Summary (Signed)
Physician Discharge Summary  [name] OYD:[id number] DOB: 05-Mar-1941 DOA: 01/17/2018  PCP: Sharilyn Sites, MD  Admit date: 01/17/2018 Discharge date: 01/23/2018  Admitted From: Home Disposition: Home   Recommendations for Outpatient Follow-up:  1. Follow up with PCP and cardiology as scheduled below 2. Please obtain BMP/CBC in one week  Home Health: None new Equipment/Devices: None new, supplemental oxygen at 4LPM.  Discharge Condition: Stable CODE STATUS: Full Diet recommendation: Heart healthy, carb-modified  Brief/Interim Summary: [name] is a 77 y.o.femalewith medical history significant forhypothyroidism, type 2 diabetes mellitus, chronic diastolic CHF, COPD, and chronic hypoxic respiratory failure on 2.5L [place] at baseline, now presenting to the emergency department for evaluation of progressive bilateral lower extremity edema and shortness of breath. Patient reports that she was previously on 2 L/min of supplemental oxygen, recently increased to 2.5L, but she has continued to be hypoxic in the 80s. She reports progressive bilateral lower extremity edema and worsening dyspnea despite increasing her Lasix to 80 mg BID. She was admitted for acute on chronic diastolic heart failure and started on IV lasix. Ultimately had good diuresis with lasix 80mg  IV BID. Dyspnea has improved and swelling in legs has diminished significantly.   Discharge Diagnoses:  Principal Problem:   Acute on chronic diastolic CHF (congestive heart failure) (HCC) Active Problems:   OBSTRUCTIVE SLEEP APNEA   Diabetes mellitus (HCC)   COPD (chronic obstructive pulmonary disease) (HCC)   Mild renal insufficiency   Acute on chronic respiratory failure with hypoxia (HCC)  Acute on chronic diastolic CHF: Presents with progressive bilateral leg edema and SOB with increasing supplemental O2 requirement. Found to have marked peripheral edema, mild BNP elevation, cardiomegaly and vascular congestion on CXR. Echo  EF 65-70% - Continue lasix to 80mg  po BID at discharge.   - Overall below what was previously suspected to be EDW (315lbs at outpatient cardiology visits). Down 312lbs > 304lbs now.  - Follow up with cardiology as scheduled.   Acute on chronic hypoxic respiratory failure: Requires 2-2.5L [place] O2 at baseline. Improved from admission.    COPD: Stable, no exacerbation. - Continue ICS/LABA and prn albuterol  Stage III CKD: Creatinine stable, actually improving with diuresis.  - Monitor BMP at follow up. SCr 0.91.   Hypothyroidism: Last TSH wnl. - Continue synthroid  Type II DM: HbA1c was 6.9% remotely - Restart home januvia, metformin   Morbid obesity: Body mass index is 51.47 kg/m  OSA - CPAP qHS  Hypokalemia - Replaced, now wnl and stable. Mg also wnl.   Allergic dermatitis: Suspected reaction to lovenox at sites of ppx dosing. No abscess.  - Topical triamcinolone prescribed. Hoping to avoid po steroids with DM and CHF.  Discharge Instructions Discharge Instructions    (HEART FAILURE PATIENTS) Call MD:  Anytime you have any of the following symptoms: 1) 3 pound weight gain in 24 hours or 5 pounds in 1 week 2) shortness of breath, with or without a dry hacking cough 3) swelling in the hands, feet or stomach 4) if you have to sleep on extra pillows at night in order to breathe.   Complete by:  As directed    Diet - low sodium heart healthy   Complete by:  As directed    Discharge instructions   Complete by:  As directed    Continue taking lasix 80mg  twice daily and follow up with your PCP and cardiologist as scheduled. If your urine output dwindles, call your doctor for recommendations. Weigh yourself daily and  follow directions if you notice weight gain.   Apply triamcinolone cream to the red areas on the belly twice daily. Lovenox has been added to your allergy list.   Increase activity slowly   Complete by:  As directed      Allergies as of 01/23/2018       Reactions   Adhesive [tape] Hives, Rash   Codeine Itching, Other (See Comments)   Lovenox [enoxaparin Sodium]    Local rash May 2019 w/ppx dosing   Vancomycin Itching, Rash   Zosyn [piperacillin Sod-tazobactam So] Itching, Rash      Medication List    TAKE these medications   antiseptic oral rinse Liqd 15 mLs by Mouth Rinse route as needed for dry mouth.   aspirin 81 MG tablet Take 81 mg by mouth at bedtime.   BENEFIBER Powd Take 15 g by mouth daily.   budesonide-formoterol 160-4.5 MCG/ACT inhaler Commonly known as:  SYMBICORT Inhale 2 puffs into the lungs 2 (two) times daily.   CALCIUM 600 + D PO Take 2 tablets by mouth daily.   furosemide 80 MG tablet Commonly known as:  LASIX Take 80 mg by mouth See admin instructions. Take 80 mg by mouth in the morning and 80 mg at 1:30 PM daily   Glucosamine-Chondroitin-Vit D3 1500-1200-800 MG-MG-UNIT Pack Take 1 tablet by mouth 2 (two) times daily.   ipratropium-albuterol 0.5-2.5 (3) MG/3ML Soln Commonly known as:  DUONEB Take 3 mLs by nebulization every 4 (four) hours as needed (for wheezing or shortness of breath).   JANUVIA 100 MG tablet Generic drug:  sitaGLIPtin Take 100 mg by mouth daily.   levothyroxine 50 MCG tablet Commonly known as:  SYNTHROID, LEVOTHROID Take 50 mcg by mouth daily.   meloxicam 7.5 MG tablet Commonly known as:  MOBIC Take 7.5 mg by mouth daily.   metFORMIN 500 MG tablet Commonly known as:  GLUCOPHAGE Take 1,000 mg by mouth 2 (two) times daily with a meal.   montelukast 10 MG tablet Commonly known as:  SINGULAIR Take 10 mg by mouth daily.   OXYGEN Inhale 2.5 L into the lungs See admin instructions. 2.5 liters as needed for shortness of breath and SCHEDULED at bedtime   polyethylene glycol packet Commonly known as:  MIRALAX / GLYCOLAX Take 17 g by mouth daily with lunch.   PROAIR HFA 108 (90 Base) MCG/ACT inhaler Generic drug:  albuterol Inhale 2 puffs into the lungs every 6 (six)  hours as needed for wheezing or shortness of breath.   traMADol 50 MG tablet Commonly known as:  ULTRAM Take 50 mg by mouth every 4 (four) hours as needed (for pain).   triamcinolone 0.025 % cream Commonly known as:  KENALOG Apply 1 application topically 2 (two) times daily.   UNABLE TO FIND CPAP: At bedtime and during naps   vitamin A & D ointment Apply 1 application topically as needed (to affected areas under breasts and buttocks).      Follow-up Information    Sharilyn Sites, MD. Go on 01/28/2018.   Specialty:  Family Medicine Why:  @12 :00 Contact information: [address] [place] [address] [phone]        Lorretta Harp, MD Follow up on 02/05/2018.   Specialties:  Cardiology, Radiology Why:  3:45pm Contact information: [address] [place] [address] [phone]          Allergies  Allergen Reactions  . Adhesive [Tape] Hives and Rash  . Codeine Itching and Other (See  Comments)  . Lovenox [Enoxaparin Sodium]     Local rash May 2019 w/ppx dosing  . Vancomycin Itching and Rash  . Zosyn [Piperacillin Sod-Tazobactam So] Itching and Rash    Consultations:  None  Procedures/Studies: Dg Chest Port 1 View  Result Date: 01/17/2018 CLINICAL DATA:  Increased swelling in the lower extremities EXAM: PORTABLE CHEST 1 VIEW COMPARISON:  07/10/2017 FINDINGS: Cardiomegaly with mild vascular congestion. Possible small left effusion. Unable to exclude left basilar atelectasis or airspace disease. Aortic atherosclerosis. No pneumothorax. IMPRESSION: 1. Cardiomegaly with mild central vascular congestion 2. Possible small left effusion. There may be atelectasis or partial consolidation in the left retrocardiac space. Electronically Signed   By: Donavan Foil M.D.   On: 01/17/2018 19:28   Subjective: Feels better, wants to go home. Breathing is about the same as yesterday, overall improved from admission significantly. Had episode of  desaturation while ambulating today but states oxygen was not connected. No chest pain. Swelling in legs continues to decrease. Has developed itchy rash at sites of lovenox injections.   Discharge Exam: Vitals:   01/23/18 0811 01/23/18 0910  BP: (!) 148/86   Pulse: 71   Resp:    Temp:    SpO2: (!) 84% 92%   General: Pleasant, obese female in no distress Cardiovascular: RRR, S1/S2 +, no rubs, no gallops Respiratory: Nonlabored with supplemental oxygen. Diminished throughout without crackles. bdominal: Soft, NT, ND, bowel sounds + Extremities: Trace BL pitting LE edema, no cyanosis Skin: Erythema and mild induration on bilateral abdomen without wounds, fluctuance. Mildly tender, mostly pruritic.   Labs: BNP (last 3 results) Recent Labs    01/17/18 1738  BNP 621.3*   Basic Metabolic Panel: Recent Labs  Lab 01/18/18 0648 01/19/18 0457 01/20/18 0717 01/21/18 0710 01/22/18 0403 01/23/18 0332  NA 138 139 138 136 138 138  K 3.6 4.0 3.8 3.2* 3.8 3.6  CL 94* 96* 94* 88* 90* 92*  CO2 33* 36* 37* 39* 37* 37*  GLUCOSE 118* 139* 143* 185* 171* 139*  BUN 21* 19 17 15 14 15   CREATININE 1.20* 1.14* 1.03* 1.00 0.94 0.91  CALCIUM 8.7* 8.7* 8.7* 8.6* 8.7* 8.5*  MG 2.0  --  1.8 1.8 1.8 1.9   Liver Function Tests: No results for input(s): AST, ALT, ALKPHOS, BILITOT, PROT, ALBUMIN in the last 168 hours. No results for input(s): LIPASE, AMYLASE in the last 168 hours. No results for input(s): AMMONIA in the last 168 hours. CBC: Recent Labs  Lab 01/17/18 1738 01/19/18 0457 01/20/18 0717 01/21/18 0710 01/22/18 0403  WBC 12.7* 11.3* 12.5* 11.9* 14.9*  NEUTROABS 9.3*  --   --   --   --   HGB 14.6 14.0 14.0 13.7 14.6  HCT 48.1* 48.0* 47.0* 45.7 47.6*  MCV 83.4 84.4 84.4 84.6 83.8  PLT 346 329 311 304 332   Cardiac Enzymes: No results for input(s): CKTOTAL, CKMB, CKMBINDEX, TROPONINI in the last 168 hours. BNP: Invalid input(s): POCBNP CBG: Recent Labs  Lab 01/22/18 0731  01/22/18 1214 01/22/18 1640 01/22/18 2212 01/23/18 0745  GLUCAP 161* 204* 195* 157* 169*   D-Dimer No results for input(s): DDIMER in the last 72 hours. Hgb A1c No results for input(s): HGBA1C in the last 72 hours. Lipid Profile No results for input(s): CHOL, HDL, LDLCALC, TRIG, CHOLHDL, LDLDIRECT in the last 72 hours. Thyroid function studies No results for input(s): TSH, T4TOTAL, T3FREE, THYROIDAB in the last 72 hours.  Invalid input(s): FREET3 Anemia work up No results for input(s):  VITAMINB12, FOLATE, FERRITIN, TIBC, IRON, RETICCTPCT in the last 72 hours. Urinalysis    Component Value Date/Time   COLORURINE YELLOW 06/25/2014 1823   APPEARANCEUR CLEAR 06/25/2014 1823   LABSPEC 1.020 06/25/2014 1823   PHURINE 5.5 06/25/2014 1823   GLUCOSEU NEGATIVE 06/25/2014 1823   HGBUR NEGATIVE 06/25/2014 1823   BILIRUBINUR NEGATIVE 06/25/2014 1823   KETONESUR NEGATIVE 06/25/2014 1823   PROTEINUR NEGATIVE 06/25/2014 1823   UROBILINOGEN 0.2 06/25/2014 1823   NITRITE NEGATIVE 06/25/2014 1823   LEUKOCYTESUR NEGATIVE 06/25/2014 1823    Microbiology Recent Results (from the past 240 hour(s))  MRSA PCR Screening     Status: None   Collection Time: 01/18/18  6:43 AM  Result Value Ref Range Status   MRSA by PCR NEGATIVE NEGATIVE Final    Comment:        The GeneXpert MRSA Assay (FDA approved for NASAL specimens only), is one component of a comprehensive MRSA colonization surveillance program. It is not intended to diagnose MRSA infection nor to guide or monitor treatment for MRSA infections. Performed at [facility], [place] [address], [place], [place] [address]     Time coordinating discharge: Approximately 40 minutes  Patrecia Pour, MD  [facility] 01/23/2018, 10:19 AM Pager [phone]

## 2018-01-28 ENCOUNTER — Ambulatory Visit: Payer: Medicare Other | Admitting: Podiatry

## 2018-02-05 ENCOUNTER — Ambulatory Visit: Payer: Medicare Other | Admitting: Cardiovascular Disease

## 2018-03-03 ENCOUNTER — Other Ambulatory Visit (HOSPITAL_COMMUNITY): Payer: Medicare Other

## 2018-03-07 ENCOUNTER — Inpatient Hospital Stay (HOSPITAL_COMMUNITY): Payer: Medicare Other | Attending: Internal Medicine

## 2018-03-07 DIAGNOSIS — D500 Iron deficiency anemia secondary to blood loss (chronic): Secondary | ICD-10-CM | POA: Insufficient documentation

## 2018-03-07 LAB — COMPREHENSIVE METABOLIC PANEL
ALT: 13 U/L — ABNORMAL LOW (ref 14–54)
AST: 14 U/L — AB (ref 15–41)
Albumin: 3.4 g/dL — ABNORMAL LOW (ref 3.5–5.0)
Alkaline Phosphatase: 72 U/L (ref 38–126)
Anion gap: 11 (ref 5–15)
BUN: 27 mg/dL — ABNORMAL HIGH (ref 6–20)
CHLORIDE: 90 mmol/L — AB (ref 101–111)
CO2: 33 mmol/L — ABNORMAL HIGH (ref 22–32)
Calcium: 9 mg/dL (ref 8.9–10.3)
Creatinine, Ser: 1.13 mg/dL — ABNORMAL HIGH (ref 0.44–1.00)
GFR, EST AFRICAN AMERICAN: 53 mL/min — AB (ref >60)
GFR, EST NON AFRICAN AMERICAN: 45 mL/min — AB (ref >60)
Glucose, Bld: 89 mg/dL (ref 65–99)
POTASSIUM: 4 mmol/L (ref 3.5–5.1)
SODIUM: 134 mmol/L — AB (ref 135–145)
Total Bilirubin: 0.8 mg/dL (ref 0.3–1.2)
Total Protein: 7.1 g/dL (ref 6.5–8.1)

## 2018-03-07 LAB — CBC WITH DIFFERENTIAL/PLATELET
BASOS ABS: 0.1 10*3/uL (ref 0.0–0.1)
Basophils Relative: 0 %
EOS ABS: 0.8 10*3/uL — AB (ref 0.0–0.7)
EOS PCT: 6 %
HCT: 45.7 % (ref 36.0–46.0)
Hemoglobin: 13.6 g/dL (ref 12.0–15.0)
LYMPHS PCT: 15 %
Lymphs Abs: 2 10*3/uL (ref 0.7–4.0)
MCH: 24.3 pg — AB (ref 26.0–34.0)
MCHC: 29.8 g/dL — ABNORMAL LOW (ref 30.0–36.0)
MCV: 81.6 fL (ref 78.0–100.0)
MONO ABS: 1.1 10*3/uL — AB (ref 0.1–1.0)
Monocytes Relative: 8 %
Neutro Abs: 9.8 10*3/uL — ABNORMAL HIGH (ref 1.7–7.7)
Neutrophils Relative %: 71 %
PLATELETS: 356 10*3/uL (ref 150–400)
RBC: 5.6 MIL/uL — ABNORMAL HIGH (ref 3.87–5.11)
RDW: 16.6 % — AB (ref 11.5–15.5)
WBC: 13.7 10*3/uL — ABNORMAL HIGH (ref 4.0–10.5)

## 2018-03-07 LAB — LACTATE DEHYDROGENASE: LDH: 187 U/L (ref 98–192)

## 2018-03-07 LAB — FERRITIN: FERRITIN: 25 ng/mL (ref 11–307)

## 2018-03-17 ENCOUNTER — Other Ambulatory Visit: Payer: Self-pay

## 2018-03-17 ENCOUNTER — Encounter (HOSPITAL_COMMUNITY): Payer: Self-pay

## 2018-03-17 ENCOUNTER — Observation Stay (HOSPITAL_COMMUNITY)
Admission: EM | Admit: 2018-03-17 | Discharge: 2018-03-18 | Disposition: A | Discharge status: Home / Self Care | Payer: Medicare Other | Attending: Family Medicine | Admitting: Family Medicine

## 2018-03-17 ENCOUNTER — Emergency Department (HOSPITAL_COMMUNITY): Payer: Medicare Other

## 2018-03-17 DIAGNOSIS — L0231 Cutaneous abscess of buttock: Secondary | ICD-10-CM

## 2018-03-17 DIAGNOSIS — N183 Chronic kidney disease, stage 3 unspecified: Secondary | ICD-10-CM | POA: Diagnosis present

## 2018-03-17 DIAGNOSIS — I509 Heart failure, unspecified: Secondary | ICD-10-CM

## 2018-03-17 DIAGNOSIS — G4733 Obstructive sleep apnea (adult) (pediatric): Secondary | ICD-10-CM | POA: Diagnosis present

## 2018-03-17 DIAGNOSIS — Z7984 Long term (current) use of oral hypoglycemic drugs: Secondary | ICD-10-CM | POA: Insufficient documentation

## 2018-03-17 DIAGNOSIS — X58XXXA Exposure to other specified factors, initial encounter: Secondary | ICD-10-CM | POA: Insufficient documentation

## 2018-03-17 DIAGNOSIS — Z6841 Body Mass Index (BMI) 40.0 and over, adult: Secondary | ICD-10-CM | POA: Insufficient documentation

## 2018-03-17 DIAGNOSIS — I130 Hypertensive heart and chronic kidney disease with heart failure and stage 1 through stage 4 chronic kidney disease, or unspecified chronic kidney disease: Secondary | ICD-10-CM | POA: Diagnosis not present

## 2018-03-17 DIAGNOSIS — I10 Essential (primary) hypertension: Secondary | ICD-10-CM | POA: Diagnosis present

## 2018-03-17 DIAGNOSIS — I5033 Acute on chronic diastolic (congestive) heart failure: Secondary | ICD-10-CM | POA: Diagnosis not present

## 2018-03-17 DIAGNOSIS — E039 Hypothyroidism, unspecified: Secondary | ICD-10-CM

## 2018-03-17 DIAGNOSIS — Z87891 Personal history of nicotine dependence: Secondary | ICD-10-CM | POA: Diagnosis not present

## 2018-03-17 DIAGNOSIS — Z79899 Other long term (current) drug therapy: Secondary | ICD-10-CM | POA: Insufficient documentation

## 2018-03-17 DIAGNOSIS — Y939 Activity, unspecified: Secondary | ICD-10-CM | POA: Insufficient documentation

## 2018-03-17 DIAGNOSIS — R0602 Shortness of breath: Secondary | ICD-10-CM | POA: Diagnosis present

## 2018-03-17 DIAGNOSIS — Y999 Unspecified external cause status: Secondary | ICD-10-CM | POA: Insufficient documentation

## 2018-03-17 DIAGNOSIS — Y929 Unspecified place or not applicable: Secondary | ICD-10-CM | POA: Insufficient documentation

## 2018-03-17 DIAGNOSIS — E66813 Obesity, class 3: Secondary | ICD-10-CM | POA: Diagnosis present

## 2018-03-17 DIAGNOSIS — L89311 Pressure ulcer of right buttock, stage 1: Secondary | ICD-10-CM | POA: Diagnosis not present

## 2018-03-17 DIAGNOSIS — Z7982 Long term (current) use of aspirin: Secondary | ICD-10-CM | POA: Insufficient documentation

## 2018-03-17 DIAGNOSIS — E1122 Type 2 diabetes mellitus with diabetic chronic kidney disease: Secondary | ICD-10-CM | POA: Diagnosis not present

## 2018-03-17 DIAGNOSIS — J449 Chronic obstructive pulmonary disease, unspecified: Secondary | ICD-10-CM | POA: Diagnosis not present

## 2018-03-17 DIAGNOSIS — R0902 Hypoxemia: Secondary | ICD-10-CM

## 2018-03-17 DIAGNOSIS — S31819A Unspecified open wound of right buttock, initial encounter: Secondary | ICD-10-CM | POA: Diagnosis present

## 2018-03-17 DIAGNOSIS — L03317 Cellulitis of buttock: Secondary | ICD-10-CM

## 2018-03-17 DIAGNOSIS — R2243 Localized swelling, mass and lump, lower limb, bilateral: Secondary | ICD-10-CM | POA: Diagnosis not present

## 2018-03-17 DIAGNOSIS — L03116 Cellulitis of left lower limb: Secondary | ICD-10-CM

## 2018-03-17 DIAGNOSIS — R600 Localized edema: Secondary | ICD-10-CM | POA: Diagnosis present

## 2018-03-17 DIAGNOSIS — E119 Type 2 diabetes mellitus without complications: Secondary | ICD-10-CM

## 2018-03-17 DIAGNOSIS — L03115 Cellulitis of right lower limb: Secondary | ICD-10-CM

## 2018-03-17 DIAGNOSIS — E118 Type 2 diabetes mellitus with unspecified complications: Secondary | ICD-10-CM | POA: Diagnosis not present

## 2018-03-17 HISTORY — DX: Heart failure, unspecified: I50.9

## 2018-03-17 HISTORY — DX: Dependence on supplemental oxygen: Z99.81

## 2018-03-17 LAB — URINALYSIS, ROUTINE W REFLEX MICROSCOPIC
Bilirubin Urine: NEGATIVE
Glucose, UA: NEGATIVE mg/dL
Hgb urine dipstick: NEGATIVE
Ketones, ur: NEGATIVE mg/dL
LEUKOCYTES UA: NEGATIVE
NITRITE: NEGATIVE
PH: 7 (ref 5.0–8.0)
PROTEIN: NEGATIVE mg/dL
Specific Gravity, Urine: 1.009 (ref 1.005–1.030)

## 2018-03-17 LAB — GLUCOSE, CAPILLARY: Glucose-Capillary: 167 mg/dL — ABNORMAL HIGH (ref 65–99)

## 2018-03-17 LAB — CBC WITH DIFFERENTIAL/PLATELET
BASOS ABS: 0.1 10*3/uL (ref 0.0–0.1)
BASOS PCT: 1 %
EOS ABS: 0.9 10*3/uL — AB (ref 0.0–0.7)
EOS PCT: 6 %
HEMATOCRIT: 43.1 % (ref 36.0–46.0)
Hemoglobin: 13 g/dL (ref 12.0–15.0)
LYMPHS ABS: 1.9 10*3/uL (ref 0.7–4.0)
LYMPHS PCT: 13 %
MCH: 24.8 pg — ABNORMAL LOW (ref 26.0–34.0)
MCHC: 30.2 g/dL (ref 30.0–36.0)
MCV: 82.1 fL (ref 78.0–100.0)
MONOS PCT: 7 %
Monocytes Absolute: 1 10*3/uL (ref 0.1–1.0)
Neutro Abs: 10.3 10*3/uL — ABNORMAL HIGH (ref 1.7–7.7)
Neutrophils Relative %: 73 %
Platelets: 421 10*3/uL — ABNORMAL HIGH (ref 150–400)
RBC: 5.25 MIL/uL — AB (ref 3.87–5.11)
RDW: 16.9 % — AB (ref 11.5–15.5)
WBC: 14.1 10*3/uL — ABNORMAL HIGH (ref 4.0–10.5)

## 2018-03-17 LAB — BRAIN NATRIURETIC PEPTIDE: B NATRIURETIC PEPTIDE 5: 94 pg/mL (ref 0.0–100.0)

## 2018-03-17 LAB — TROPONIN I

## 2018-03-17 LAB — BASIC METABOLIC PANEL
Anion gap: 11 (ref 5–15)
BUN: 31 mg/dL — ABNORMAL HIGH (ref 6–20)
CALCIUM: 8.6 mg/dL — AB (ref 8.9–10.3)
CO2: 34 mmol/L — ABNORMAL HIGH (ref 22–32)
CREATININE: 1.3 mg/dL — AB (ref 0.44–1.00)
Chloride: 92 mmol/L — ABNORMAL LOW (ref 101–111)
GFR calc Af Amer: 44 mL/min — ABNORMAL LOW (ref >60)
GFR calc non Af Amer: 38 mL/min — ABNORMAL LOW (ref >60)
Glucose, Bld: 168 mg/dL — ABNORMAL HIGH (ref 65–99)
Potassium: 4.3 mmol/L (ref 3.5–5.1)
SODIUM: 137 mmol/L (ref 135–145)

## 2018-03-17 LAB — LACTIC ACID, PLASMA
Lactic Acid, Venous: 1.6 mmol/L (ref 0.5–1.9)
Lactic Acid, Venous: 1.6 mmol/L (ref 0.5–1.9)

## 2018-03-17 MED ORDER — ASPIRIN 81 MG PO CHEW
81.0000 mg | CHEWABLE_TABLET | Freq: Every day | ORAL | Status: DC
  Administered 2018-03-17: 81 mg via ORAL
  Filled 2018-03-17: qty 1

## 2018-03-17 MED ORDER — PSYLLIUM 95 % PO PACK
1.0000 | PACK | Freq: Every day | ORAL | Status: DC
  Filled 2018-03-17 (×3): qty 1

## 2018-03-17 MED ORDER — DIPHENHYDRAMINE HCL 25 MG PO CAPS: 25.0000 mg | ORAL_CAPSULE | Freq: Four times a day (QID) | ORAL | Status: DC | PRN

## 2018-03-17 MED ORDER — FLUCONAZOLE 100 MG PO TABS
100.0000 mg | ORAL_TABLET | Freq: Every day | ORAL | Status: DC
  Administered 2018-03-18: 100 mg via ORAL
  Filled 2018-03-17: qty 1

## 2018-03-17 MED ORDER — LEVOTHYROXINE SODIUM 50 MCG PO TABS
50.0000 ug | ORAL_TABLET | Freq: Every day | ORAL | Status: DC
  Administered 2018-03-18: 50 ug via ORAL
  Filled 2018-03-17: qty 1

## 2018-03-17 MED ORDER — POLYETHYLENE GLYCOL 3350 17 G PO PACK
17.0000 g | PACK | Freq: Every day | ORAL | Status: DC
  Administered 2018-03-18: 17 g via ORAL
  Filled 2018-03-17: qty 1

## 2018-03-17 MED ORDER — FUROSEMIDE 10 MG/ML IJ SOLN
40.0000 mg | Freq: Once | INTRAMUSCULAR | Status: AC
  Administered 2018-03-17: 40 mg via INTRAVENOUS
  Filled 2018-03-17: qty 4

## 2018-03-17 MED ORDER — MOMETASONE FURO-FORMOTEROL FUM 200-5 MCG/ACT IN AERO
2.0000 | INHALATION_SPRAY | Freq: Two times a day (BID) | RESPIRATORY_TRACT | Status: DC
  Administered 2018-03-17 – 2018-03-18 (×2): 2 via RESPIRATORY_TRACT
  Filled 2018-03-17: qty 8.8

## 2018-03-17 MED ORDER — INSULIN ASPART 100 UNIT/ML ~~LOC~~ SOLN: 0.0000 [IU] | Freq: Every day | SUBCUTANEOUS | Status: DC

## 2018-03-17 MED ORDER — ONDANSETRON HCL 4 MG/2ML IJ SOLN: 4.0000 mg | Freq: Four times a day (QID) | INTRAMUSCULAR | Status: DC | PRN

## 2018-03-17 MED ORDER — TRAMADOL HCL 50 MG PO TABS
50.0000 mg | ORAL_TABLET | ORAL | Status: DC | PRN
  Administered 2018-03-17 – 2018-03-18 (×2): 50 mg via ORAL
  Filled 2018-03-17 (×2): qty 1

## 2018-03-17 MED ORDER — ALBUTEROL SULFATE (2.5 MG/3ML) 0.083% IN NEBU
2.5000 mg | INHALATION_SOLUTION | Freq: Once | RESPIRATORY_TRACT | Status: AC
  Administered 2018-03-17: 2.5 mg via RESPIRATORY_TRACT
  Filled 2018-03-17: qty 3

## 2018-03-17 MED ORDER — ACETAMINOPHEN 325 MG PO TABS: 650.0000 mg | ORAL_TABLET | ORAL | Status: DC | PRN

## 2018-03-17 MED ORDER — CLINDAMYCIN PHOSPHATE 600 MG/50ML IV SOLN
600.0000 mg | Freq: Once | INTRAVENOUS | Status: AC
  Administered 2018-03-17: 600 mg via INTRAVENOUS
  Filled 2018-03-17: qty 50

## 2018-03-17 MED ORDER — ENOXAPARIN SODIUM 80 MG/0.8ML ~~LOC~~ SOLN
70.0000 mg | SUBCUTANEOUS | Status: DC
  Filled 2018-03-17: qty 0.8

## 2018-03-17 MED ORDER — IPRATROPIUM-ALBUTEROL 0.5-2.5 (3) MG/3ML IN SOLN
3.0000 mL | Freq: Once | RESPIRATORY_TRACT | Status: AC
  Administered 2018-03-17: 3 mL via RESPIRATORY_TRACT
  Filled 2018-03-17: qty 3

## 2018-03-17 MED ORDER — INSULIN ASPART 100 UNIT/ML ~~LOC~~ SOLN
0.0000 [IU] | Freq: Three times a day (TID) | SUBCUTANEOUS | Status: DC
  Administered 2018-03-18: 2 [IU] via SUBCUTANEOUS
  Administered 2018-03-18: 3 [IU] via SUBCUTANEOUS

## 2018-03-17 MED ORDER — MUPIROCIN CALCIUM 2 % EX CREA
TOPICAL_CREAM | Freq: Two times a day (BID) | CUTANEOUS | Status: DC
  Administered 2018-03-17 – 2018-03-18 (×2): via TOPICAL
  Filled 2018-03-17: qty 15

## 2018-03-17 MED ORDER — SODIUM CHLORIDE 0.9 % IV SOLN: 250.0000 mL | INTRAVENOUS | Status: DC | PRN

## 2018-03-17 MED ORDER — FUROSEMIDE 10 MG/ML IJ SOLN
80.0000 mg | Freq: Two times a day (BID) | INTRAMUSCULAR | Status: DC
  Administered 2018-03-17 – 2018-03-18 (×2): 80 mg via INTRAVENOUS
  Filled 2018-03-17 (×2): qty 8

## 2018-03-17 MED ORDER — NYSTATIN 100000 UNIT/GM EX POWD
Freq: Two times a day (BID) | CUTANEOUS | Status: DC
  Administered 2018-03-17 – 2018-03-18 (×2): via TOPICAL
  Filled 2018-03-17 (×2): qty 15

## 2018-03-17 MED ORDER — SODIUM CHLORIDE 0.9% FLUSH: 3.0000 mL | INTRAVENOUS | Status: DC | PRN

## 2018-03-17 MED ORDER — IPRATROPIUM-ALBUTEROL 0.5-2.5 (3) MG/3ML IN SOLN
3.0000 mL | RESPIRATORY_TRACT | Status: DC | PRN
  Administered 2018-03-17 – 2018-03-18 (×2): 3 mL via RESPIRATORY_TRACT
  Filled 2018-03-17 (×2): qty 3

## 2018-03-17 MED ORDER — MONTELUKAST SODIUM 10 MG PO TABS
10.0000 mg | ORAL_TABLET | Freq: Every day | ORAL | Status: DC
  Administered 2018-03-18: 10 mg via ORAL
  Filled 2018-03-17: qty 1

## 2018-03-17 MED ORDER — SODIUM CHLORIDE 0.9% FLUSH
3.0000 mL | Freq: Two times a day (BID) | INTRAVENOUS | Status: DC
  Administered 2018-03-17 – 2018-03-18 (×2): 3 mL via INTRAVENOUS

## 2018-03-17 MED ORDER — BENEFIBER PO POWD: 15.0000 g | Freq: Every day | ORAL | Status: DC

## 2018-03-17 NOTE — ED Notes (Signed)
Placed patient on bariatric hospital bed.

## 2018-03-17 NOTE — ED Notes (Addendum)
Emptied foley bag of 800 ml urine.

## 2018-03-17 NOTE — ED Notes (Signed)
Pt has very red and excoriated perineal area noted with catheter insertion.  Body habitus made insertion very difficult due to large labia and pain due to excoriation.  Pt tolerated fairly well, but expressed significant pain with wiping and touching for insertion.

## 2018-03-17 NOTE — Consult Note (Signed)
Reason for Consult: Right buttock abscess Referring Physician: Dr. Alton Revere is an 77 y.o. female.  HPI: Patient is a 77 year old white female with multiple medical problems including history of congestive heart failure, COPD, home oxygen requirements, sleep apnea, and morbid obesity who was referred to the hospital by her primary care physician for evaluation and treatment of shortness of breath and a right buttock abscess.  Patient states that has been draining for the past 2 weeks.  She has been getting topical antifungal cream in the perineal region.  Her ability to ambulate is limited and she is somewhat bedridden.  Past Medical History:  Diagnosis Date  . Anemia   . Arthritis   . Bronchitis 05/2014  . Cellulitis of lower extremity 06/2014   bilateral  . CHF (congestive heart failure) ([place])   . COPD (chronic obstructive pulmonary disease) ([place])   . Diabetes mellitus   . Diverticulitis   . Gallstone   . Hypertension   . MRSA (methicillin resistant Staphylococcus aureus)    has now "tested negative"- no open wounds as of 02-10-15  . Obesity   . On home O2   . Shortness of breath    with exertion   . Sleep apnea    cpap at 10   . Sleep apnea   . Thyroid disease   . Tubular adenoma of colon     Past Surgical History:  Procedure Laterality Date  . ABDOMINAL HYSTERECTOMY  1979  . ABDOMINAL HYSTERECTOMY  1978  . APPENDECTOMY    . APPLICATION OF WOUND VAC  wound vac  . APPLICATION OF WOUND VAC  06/26/2012   Procedure: APPLICATION OF WOUND VAC;  Surgeon: Gayland Curry, MD,FACS;  Location: [facility];  Service: General;;  . BACK SURGERY    . COLON RESECTION  10/09/2011   Procedure: COLON RESECTION;  Surgeon: Gayland Curry, MD;  Location: [facility];  Service: General;  Laterality: N/A;  Colon Resection with colostomy  . COLON RESECTION  06/26/2012   Procedure: COLON RESECTION;  Surgeon: Gayland Curry, MD,FACS;  Location: [facility];  Service: General;  Laterality: N/A;  .  ESOPHAGOGASTRODUODENOSCOPY (EGD) WITH PROPOFOL N/A 02/17/2015   Procedure: ESOPHAGOGASTRODUODENOSCOPY (EGD) WITH PROPOFOL;  Surgeon: Jerene Bears, MD;  Location: [facility];  Service: Gastroenterology;  Laterality: N/A;  . jackson pratt    . PROCTOSCOPY  06/26/2012   Procedure: PROCTOSCOPY;  Surgeon: Gayland Curry, MD,FACS;  Location: [facility];  Service: General;  Laterality: N/A;  Rigid Proctoscopy    Family History  Problem Relation Age of Onset  . Heart disease Father   . Diabetes Father   . Hypertension Father   . Diabetes Mother   . Hypertension Mother   . Heart disease Brother   . Diabetes Brother   . Diabetes Sister   . Colon cancer Neg Hx   . Colon polyps Neg Hx   . Esophageal cancer Neg Hx   . Kidney disease Neg Hx   . Gallbladder disease Neg Hx     Social History:  reports that she quit smoking about 7 years ago. She has never used smokeless tobacco. She reports that she does not drink alcohol or use drugs.  Allergies:  Allergies  Allergen Reactions  . Adhesive [Tape] Hives and Rash  . Codeine Itching and Other (See Comments)  . Lovenox [Enoxaparin Sodium]     Local rash May 2019 w/ppx dosing  . Vancomycin Itching and Rash  . Zosyn [Piperacillin  Sod-Tazobactam So] Itching and Rash    Medications: I have reviewed the patient's current medications.  Results for orders placed or performed during the hospital encounter of 03/17/18 (from the past 48 hour(s))  Basic metabolic panel     Status: Abnormal   Collection Time: 03/17/18  9:43 AM  Result Value Ref Range   Sodium 137 135 - 145 mmol/L   Potassium 4.3 3.5 - 5.1 mmol/L   Chloride 92 (L) 101 - 111 mmol/L   CO2 34 (H) 22 - 32 mmol/L   Glucose, Bld 168 (H) 65 - 99 mg/dL   BUN 31 (H) 6 - 20 mg/dL   Creatinine, Ser 1.30 (H) 0.44 - 1.00 mg/dL   Calcium 8.6 (L) 8.9 - 10.3 mg/dL   GFR calc non Af Amer 38 (L) >60 mL/min   GFR calc Af Amer 44 (L) >60 mL/min    Comment: (NOTE) The eGFR has been calculated using the  CKD EPI equation. This calculation has not been validated in all clinical situations. eGFR's persistently <60 mL/min signify possible Chronic Kidney Disease.    Anion gap 11 5 - 15    Comment: Performed at [facility], [address], [place], [place] [address]  Brain natriuretic peptide     Status: None   Collection Time: 03/17/18  9:43 AM  Result Value Ref Range   B Natriuretic Peptide 94.0 0.0 - 100.0 pg/mL    Comment: Performed at [facility], [address], [place], [place] [address]  Troponin I     Status: None   Collection Time: 03/17/18  9:43 AM  Result Value Ref Range   Troponin I <0.03 <0.03 ng/mL    Comment: Performed at [facility], [address], [place], [place] [address]  Lactic acid, plasma     Status: None   Collection Time: 03/17/18  9:43 AM  Result Value Ref Range   Lactic Acid, Venous 1.6 0.5 - 1.9 mmol/L    Comment: Performed at [facility], [address] [place], [place] [address]  CBC with Differential     Status: Abnormal   Collection Time: 03/17/18  9:43 AM  Result Value Ref Range   WBC 14.1 (H) 4.0 - 10.5 K/uL   RBC 5.25 (H) 3.87 - 5.11 MIL/uL   Hemoglobin 13.0 12.0 - 15.0 g/dL   HCT 43.1 36.0 - 46.0 %   MCV 82.1 78.0 - 100.0 fL   MCH 24.8 (L) 26.0 - 34.0 pg   MCHC 30.2 30.0 - 36.0 g/dL   RDW 16.9 (H) 11.5 - 15.5 %   Platelets 421 (H) 150 - 400 K/uL   Neutrophils Relative % 73 %   Neutro Abs 10.3 (H) 1.7 - 7.7 K/uL   Lymphocytes Relative 13 %   Lymphs Abs 1.9 0.7 - 4.0 K/uL   Monocytes Relative 7 %   Monocytes Absolute 1.0 0.1 - 1.0 K/uL   Eosinophils Relative 6 %   Eosinophils Absolute 0.9 (H) 0.0 - 0.7 K/uL   Basophils Relative 1 %   Basophils Absolute 0.1 0.0 - 0.1 K/uL    Comment: Performed at [facility], [address], [place], [place] [address]  Urinalysis, Routine w reflex microscopic     Status: None   Collection Time: 03/17/18  9:43 AM  Result Value Ref Range   Color, Urine YELLOW YELLOW   APPearance CLEAR CLEAR   Specific  Gravity, Urine 1.009 1.005 - 1.030   pH 7.0 5.0 - 8.0   Glucose, UA NEGATIVE  NEGATIVE mg/dL   Hgb urine dipstick NEGATIVE NEGATIVE   Bilirubin Urine NEGATIVE NEGATIVE   Ketones, ur NEGATIVE NEGATIVE mg/dL   Protein, ur NEGATIVE NEGATIVE mg/dL   Nitrite NEGATIVE NEGATIVE   Leukocytes, UA NEGATIVE NEGATIVE    Comment: Performed at [facility], [address] [place], [place] [address]    No results found.  ROS:  Pertinent items are noted in HPI.  Blood pressure (!) 88/72, pulse 78, temperature 97.7 F (36.5 C), temperature source Oral, resp. rate 15, weight (!) 307 lb (139.3 kg), SpO2 (!) 89 %. Physical Exam: Pleasant morbidly obese white female in no acute distress Head is normocephalic, atraumatic Lungs are relatively clear to auscultation without significant wheezing noted. Heart sounds are distant. Skin examination reveals moist superficial irritated skin around the anus and involving both buttocks at the crease.  There is skin breakdown down to the subcutaneous tissue along the right buttock cheek, 2 cm in its greatest diameter.  No purulent drainage is noted.  There is some induration noted around it. Labs reviewed  Assessment/Plan: Impression: Right buttock decubitus ulcer, no significant fluctuant abscess as this wound is open.  Prognosis for healing will be prolonged secondary to her multiple comorbidities. Plan: Will applied Bactrim cream to the right buttock wound twice a day.  We will continue topical antifungal cream in the groin crease and perineum.  No need for acute surgical intervention.  Aviva Signs 03/17/2018, 12:18 PM

## 2018-03-17 NOTE — ED Provider Notes (Signed)
[facility] EMERGENCY DEPARTMENT Provider Note   CSN: [id number] Arrival date & time: 03/17/18  5621     History   Chief Complaint Chief Complaint  Patient presents with  . Abscess  . Shortness of Breath  . Leg Swelling    HPI [name] is a 77 y.o. female.  HPI  Pt was seen at 0925. Per pt and her family, c/o gradual onset and worsening of persistent pedal edema for the past 2 to 3 weeks. Has been associated with weight gain, SOB and "red legs." Pt states she cannot lay flat due to increasing SOB. SOB persists despite wearing her home O2 N/C. Pt's family states pt's usual weight is "usually below 300lbs" and weighed "307lbs" this morning.  Pt also c/o "sore" on her right buttocks for the past 4 weeks. Pt's family states the area is open and "will drain some." Pt was evaluated by her PMD today, then sent to the ED for further evaluation and admission. Denies CP/palpitations, no cough, no abd pain, no N/V/D, no fevers.    Past Medical History:  Diagnosis Date  . Anemia   . Arthritis   . Bronchitis 05/2014  . Cellulitis of lower extremity 06/2014   bilateral  . CHF (congestive heart failure) ([place])   . COPD (chronic obstructive pulmonary disease) ([place])   . Diabetes mellitus   . Diverticulitis   . Gallstone   . Hypertension   . MRSA (methicillin resistant Staphylococcus aureus)    has now "tested negative"- no open wounds as of 02-10-15  . Obesity   . On home O2   . Shortness of breath    with exertion   . Sleep apnea    cpap at 10   . Sleep apnea   . Thyroid disease   . Tubular adenoma of colon     Patient Active Problem List   Diagnosis Date Noted  . Mild renal insufficiency 01/17/2018  . Acute on chronic respiratory failure with hypoxia ([place]) 01/17/2018  . Acute on chronic diastolic CHF (congestive heart failure) ([place]) 01/17/2018  . Bilateral lower extremity edema 07/17/2017  . 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 IDA  (iron deficiency anemia)   . Leukocytosis 11/06/2014  . COPD (chronic obstructive pulmonary disease) ([place]) 06/05/2014  . Hypoxia 06/05/2014  . Iron deficiency anemia due to chronic blood loss 04/12/2014  . Postoperative MRSA infection of lower abdominal wound 03/22/2014  . Thrombocytosis ([place]) 03/20/2014  . Incisional hernia without mention of obstruction or gangrene 05/13/2013  . Cellulitis 03/14/2013  . Draining postoperative wound 01/27/2013  . S/P colon resection 06/27/2012  . Obesity, Class III, BMI 40-49.9 (morbid obesity) ([place]) 10/18/2011  . Diverticulitis of colon with perforation sigmoid colectomy and colostomy 1/15 10/09/2011  . Hypertension 10/09/2011  . Diabetes mellitus ([place]) 10/09/2011  . OBSTRUCTIVE SLEEP APNEA 03/10/2008    Past Surgical History:  Procedure Laterality Date  . ABDOMINAL HYSTERECTOMY  1979  . ABDOMINAL HYSTERECTOMY  1978  . APPENDECTOMY    . APPLICATION OF WOUND VAC  wound vac  . APPLICATION OF WOUND VAC  06/26/2012   Procedure: APPLICATION OF WOUND VAC;  Surgeon: Gayland Curry, MD,FACS;  Location: [facility];  Service: General;;  . BACK SURGERY    . COLON RESECTION  10/09/2011   Procedure: COLON RESECTION;  Surgeon: Gayland Curry, MD;  Location: [facility];  Service: General;  Laterality: N/A;  Colon Resection with colostomy  . COLON RESECTION  06/26/2012   Procedure: COLON RESECTION;  Surgeon: Gayland Curry, MD,FACS;  Location: [facility];  Service: General;  Laterality: N/A;  . ESOPHAGOGASTRODUODENOSCOPY (EGD) WITH PROPOFOL N/A 02/17/2015   Procedure: ESOPHAGOGASTRODUODENOSCOPY (EGD) WITH PROPOFOL;  Surgeon: Jerene Bears, MD;  Location: [facility];  Service: Gastroenterology;  Laterality: N/A;  . jackson pratt    . PROCTOSCOPY  06/26/2012   Procedure: PROCTOSCOPY;  Surgeon: Gayland Curry, MD,FACS;  Location: [facility];  Service: General;  Laterality: N/A;  Rigid Proctoscopy     OB History   None      Home Medications    Prior to Admission medications     Medication Sig Start Date End Date Taking? Authorizing Provider  albuterol (PROAIR HFA) 108 (90 Base) MCG/ACT inhaler Inhale 2 puffs into the lungs every 6 (six) hours as needed for wheezing or shortness of breath.    [provider]  antiseptic oral rinse (BIOTENE) LIQD 15 mLs by Mouth Rinse route as needed for dry mouth.    [provider]  aspirin 81 MG tablet Take 81 mg by mouth at bedtime.     [provider]  budesonide-formoterol (SYMBICORT) 160-4.5 MCG/ACT inhaler Inhale 2 puffs into the lungs 2 (two) times daily.    [provider]  Calcium Carb-Cholecalciferol (CALCIUM 600 + D PO) Take 2 tablets by mouth daily.    [provider]  furosemide (LASIX) 80 MG tablet Take 80 mg by mouth See admin instructions. Take 80 mg by mouth in the morning and 80 mg at 1:30 PM daily 12/25/17   [provider]  Glucosamine-Chondroitin-Vit D3 1500-1200-800 MG-MG-UNIT PACK Take 1 tablet by mouth 2 (two) times daily.     [provider]  ipratropium-albuterol (DUONEB) 0.5-2.5 (3) MG/3ML SOLN Take 3 mLs by nebulization every 4 (four) hours as needed (for wheezing or shortness of breath).  01/10/18   [provider]  JANUVIA 100 MG tablet Take 100 mg by mouth daily.  11/11/14   [provider]  levothyroxine (SYNTHROID, LEVOTHROID) 50 MCG tablet Take 50 mcg by mouth daily. 08/04/15   [provider]  meloxicam (MOBIC) 7.5 MG tablet Take 7.5 mg by mouth daily.     [provider]  metFORMIN (GLUCOPHAGE) 500 MG tablet Take 1,000 mg by mouth 2 (two) times daily with a meal.     [provider]  montelukast (SINGULAIR) 10 MG tablet Take 10 mg by mouth daily.     [provider]  OXYGEN Inhale 2.5 L into the lungs See admin instructions. 2.5 liters as needed for shortness of breath and SCHEDULED at bedtime    [provider]  polyethylene glycol (MIRALAX / GLYCOLAX) packet Take 17 g by mouth daily  with lunch.     [provider]  traMADol (ULTRAM) 50 MG tablet Take 50 mg by mouth every 4 (four) hours as needed (for pain).  01/09/12   [provider]  triamcinolone (KENALOG) 0.025 % cream Apply 1 application topically 2 (two) times daily. 01/23/18   Patrecia Pour, MD  UNABLE TO FIND CPAP: At bedtime and during naps    [provider]  Vitamins A & D (VITAMIN A & D) ointment Apply 1 application topically as needed (to affected areas under breasts and buttocks).     [provider]  Wheat Dextrin (BENEFIBER) POWD Take 15 g by mouth daily.     [provider]    Family History Family History  Problem  Relation Age of Onset  . Heart disease Father   . Diabetes Father   . Hypertension Father   . Diabetes Mother   . Hypertension Mother   . Heart disease Brother   . Diabetes Brother   . Diabetes Sister   . Colon cancer Neg Hx   . Colon polyps Neg Hx   . Esophageal cancer Neg Hx   . Kidney disease Neg Hx   . Gallbladder disease Neg Hx     Social History Social History   Tobacco Use  . Smoking status: Former Smoker    Last attempt to quit: 09/24/2010    Years since quitting: 7.4  . Smokeless tobacco: Never Used  Substance Use Topics  . Alcohol use: No    Alcohol/week: 0.0 oz  . Drug use: No     Allergies   Adhesive [tape]; Codeine; Lovenox [enoxaparin sodium]; Vancomycin; and Zosyn [piperacillin sod-tazobactam so]   Review of Systems Review of Systems ROS: Statement: All systems negative except as marked or noted in the HPI; Constitutional: Negative for fever and chills. ; ; Eyes: Negative for eye pain, redness and discharge. ; ; ENMT: Negative for ear pain, hoarseness, nasal congestion, sinus pressure and sore throat. ; ; Cardiovascular: Negative for chest pain, palpitations, diaphoresis, +dyspnea and peripheral edema. ; ; Respiratory: Negative for cough, wheezing and stridor. ; ; Gastrointestinal: Negative for nausea, vomiting,  diarrhea, abdominal pain, blood in stool, hematemesis, jaundice and rectal bleeding. . ; ; Genitourinary: Negative for dysuria, flank pain and hematuria. ; ; Musculoskeletal: Negative for back pain and neck pain. Negative for swelling and trauma.; ; Skin: Negative for pruritus, abrasions, blisters, bruising and +rash, skin lesion.; ; Neuro: Negative for headache, lightheadedness and neck stiffness. Negative for weakness, altered level of consciousness, altered mental status, extremity weakness, paresthesias, involuntary movement, seizure and syncope.       Physical Exam Updated Vital Signs BP 128/79 (BP Location: Left Arm)   Pulse 78   Temp 97.7 F (36.5 C) (Oral)   Resp (!) 28 Comment: pt sob from w/c to stretcher  Wt (!) 139.3 kg (307 lb)   SpO2 91%   BMI 51.09 kg/m    Patient Vitals for the past 24 hrs:  BP Temp Temp src Pulse Resp SpO2 Weight  03/17/18 1300 (!) 110/59 - - 80 (!) 27 (!) 87 % -  03/17/18 1230 140/63 - - 78 (!) 24 90 % -  03/17/18 1200 - - - 78 (!) 25 (!) 87 % -  03/17/18 1130 (!) 88/72 - - 78 15 (!) 89 % -  03/17/18 1100 (!) 105/58 - - 75 (!) 25 90 % -  03/17/18 1041 - - - - - 90 % -  03/17/18 1030 - - - 76 19 92 % -  03/17/18 1000 (!) 92/59 - - 73 (!) 24 95 % -  03/17/18 0921 128/79 97.7 F (36.5 C) Oral 78 (!) 28 91 % -  03/17/18 0920 - - - - - - (!) 139.3 kg (307 lb)     Physical Exam 0930: Physical examination:  Nursing notes reviewed; Vital signs and O2 SAT reviewed;  Constitutional: Well developed, Well nourished, Well hydrated, In no acute distress; Head:  Normocephalic, atraumatic; Eyes: EOMI, PERRL, No scleral icterus; ENMT: Mouth and pharynx normal, Mucous membranes moist; Neck: Supple, Full range of motion, No lymphadenopathy; Cardiovascular: Regular rate and rhythm, No gallop; Respiratory: Breath sounds coarse & equal bilaterally, No wheezes.  Speaking full sentences with ease,  Normal respiratory effort/excursion. Pt becomes very SOB and tachypneic  with slightest exertion, including trying to lay on her side.; Chest: Nontender, Movement normal; Abdomen: Soft, Nontender, Nondistended, Normal bowel sounds. Buttock exam performed w/permission of pt and ED RN chaperone present: approximately 8x5cm area induration and fluctuance right medial buttock, overlying erythema, central 3-4cm open area with purulent drainage expressed. Induration extends to gluteal cleft, but does not appear to extend into anus or perineum. Exam ended quickly due to pt becoming increasingly SOB while leaning onto her left side.;;; Genitourinary: No CVA tenderness; Extremities: Peripheral pulses normal, No tenderness, +2 pedal edema bilat feet to knees with erythema anteriorly. No calf tenderness asymmetry.; Neuro: AA&Ox3, Major CN grossly intact.  Speech clear. No gross focal motor deficits in extremities.; Skin: Color normal, Warm, Dry. +multiple excoriations and scabs scattered to torso, extremities, under skin folds.   ED Treatments / Results  Labs (all labs ordered are listed, but only abnormal results are displayed)   EKG EKG Interpretation  Date/Time:  Monday March 17 2018 09:52:35 EDT Ventricular Rate:  74 PR Interval:    QRS Duration: 84 QT Interval:  412 QTC Calculation: 458 R Axis:   137 Text Interpretation:  Sinus rhythm Atrial premature complexes Prolonged PR interval Low voltage, precordial leads Probable right ventricular hypertrophy Baseline wander When compared with ECG of 01/17/2018 No significant change was found Confirmed by Francine Graven [id number]) on 03/17/2018 10:06:40 AM   Radiology   Procedures Procedures (including critical care time)  Medications Ordered in ED Medications  ipratropium-albuterol (DUONEB) 0.5-2.5 (3) MG/3ML nebulizer solution 3 mL (has no administration in time range)  albuterol (PROVENTIL) (2.5 MG/3ML) 0.083% nebulizer solution 2.5 mg (has no administration in time range)     Initial Impression / Assessment and Plan /  ED Course  I have reviewed the triage vital signs and the nursing notes.  Pertinent labs & imaging results that were available during my care of the patient were reviewed by me and considered in my medical decision making (see chart for details).  MDM Reviewed: previous chart, nursing note and vitals Reviewed previous: labs and ECG Interpretation: labs, ECG and x-ray   Results for orders placed or performed during the hospital encounter of 25/95/63  Basic metabolic panel  Result Value Ref Range   Sodium 137 135 - 145 mmol/L   Potassium 4.3 3.5 - 5.1 mmol/L   Chloride 92 (L) 101 - 111 mmol/L   CO2 34 (H) 22 - 32 mmol/L   Glucose, Bld 168 (H) 65 - 99 mg/dL   BUN 31 (H) 6 - 20 mg/dL   Creatinine, Ser 1.30 (H) 0.44 - 1.00 mg/dL   Calcium 8.6 (L) 8.9 - 10.3 mg/dL   GFR calc non Af Amer 38 (L) >60 mL/min   GFR calc Af Amer 44 (L) >60 mL/min   Anion gap 11 5 - 15  Brain natriuretic peptide  Result Value Ref Range   B Natriuretic Peptide 94.0 0.0 - 100.0 pg/mL  Troponin I  Result Value Ref Range   Troponin I <0.03 <0.03 ng/mL  Lactic acid, plasma  Result Value Ref Range   Lactic Acid, Venous 1.6 0.5 - 1.9 mmol/L  Lactic acid, plasma  Result Value Ref Range   Lactic Acid, Venous 1.6 0.5 - 1.9 mmol/L  CBC with Differential  Result Value Ref Range   WBC 14.1 (H) 4.0 - 10.5 K/uL   RBC 5.25 (H) 3.87 - 5.11 MIL/uL   Hemoglobin 13.0 12.0 - 15.0 g/dL  HCT 43.1 36.0 - 46.0 %   MCV 82.1 78.0 - 100.0 fL   MCH 24.8 (L) 26.0 - 34.0 pg   MCHC 30.2 30.0 - 36.0 g/dL   RDW 16.9 (H) 11.5 - 15.5 %   Platelets 421 (H) 150 - 400 K/uL   Neutrophils Relative % 73 %   Neutro Abs 10.3 (H) 1.7 - 7.7 K/uL   Lymphocytes Relative 13 %   Lymphs Abs 1.9 0.7 - 4.0 K/uL   Monocytes Relative 7 %   Monocytes Absolute 1.0 0.1 - 1.0 K/uL   Eosinophils Relative 6 %   Eosinophils Absolute 0.9 (H) 0.0 - 0.7 K/uL   Basophils Relative 1 %   Basophils Absolute 0.1 0.0 - 0.1 K/uL  Urinalysis, Routine w reflex  microscopic  Result Value Ref Range   Color, Urine YELLOW YELLOW   APPearance CLEAR CLEAR   Specific Gravity, Urine 1.009 1.005 - 1.030   pH 7.0 5.0 - 8.0   Glucose, UA NEGATIVE NEGATIVE mg/dL   Hgb urine dipstick NEGATIVE NEGATIVE   Bilirubin Urine NEGATIVE NEGATIVE   Ketones, ur NEGATIVE NEGATIVE mg/dL   Protein, ur NEGATIVE NEGATIVE mg/dL   Nitrite NEGATIVE NEGATIVE   Leukocytes, UA NEGATIVE NEGATIVE   Dg Chest 2 View Result Date: 03/17/2018 CLINICAL DATA:  Shortness of breath, increased weight, pedal edema EXAM: CHEST - 2 VIEW COMPARISON:  01/17/2018 FINDINGS: There is bilateral diffuse mild interstitial thickening. There is no focal parenchymal opacity. There is no pleural effusion or pneumothorax. There is stable cardiomegaly. The osseous structures are unremarkable. IMPRESSION: Cardiomegaly with mild pulmonary vascular congestion. Electronically Signed   By: Kathreen Devoid   On: 03/17/2018 12:22    1135:  Labs c/w previous on file.  Pt will desat with movement and c/o increasing SOB with increasing work of breathing with rolling side to side for exam, foley placement, etc. Short neb given initially without improvement. Will need improved resp status before more complete exam/tx of right buttock wound can be completed. IV clindamycin and lasix ordered for cellulitis and CHF. T/C returned from [facility] Dr. Arnoldo Morale, case discussed, including:  HPI, pertinent PM/SHx, VS/PE, dx testing, ED course and treatment:  Agreeable to consult.    1305:  T/C returned from [facility] Dr. Roderic Palau, case discussed, including:  HPI, pertinent PM/SHx, VS/PE, dx testing, ED course and treatment:  Agreeable to come to ED for evaluation for admission.     Final Clinical Impressions(s) / ED Diagnoses   Final diagnoses:  None    ED Discharge Orders    None       Francine Graven, DO 03/21/18 1249

## 2018-03-17 NOTE — H&P (Signed)
History and Physical    [name] DGL:[id number] DOB: 04-02-1941 DOA: 03/17/2018  PCP: Sharilyn Sites, MD  [name] coming from: home  I have personally briefly reviewed [name]'s old medical records in [place]  Chief Complaint: Swelling in legs and wound on right buttocks  HPI: [name] is a 77 y.o. female with medical history significant of COPD, CHF, chronic respiratory failure on home oxygen, obstructive sleep apnea, diabetes, who appears to have poor functional capacity at baseline.  [name] was receiving the hospital and was discharged in early May after being treated for volume overload from congestive heart failure.  She reports that since her discharge, she has been gradually having worsening edema and increased weight.  She reports compliance with medications and salt intake.  She reports that she is chronically short of breath and at baseline has difficulty walking from her bedroom to the bathroom.  Over the past few weeks, she has developed an ulcer on her right buttocks.  Denies any fever.  She does have some tenderness in the right buttocks.  She does report some drainage.  She has complained of a fungal rash in her perineal area for which she is been taking Diflucan.  She went to her primary care physician today for evaluation of this wound and worsening lower extremity edema and was sent to the ER for evaluation.  ED Course: In the emergency room, she was noted to have increased respiratory effort.  Blood pressures have been labile.  She was noted to be in volume overload and received a dose of intravenous Lasix.  Chest x-ray showed evidence of volume overload.  Blood work was relatively unrevealing.  EKG did not show any acute changes.  Due to concerns of infection of her right buttocks, general surgery evaluate the [name] did not feel that she needed any surgical intervention at this time.  Local wound care was recommended.  She is been referred for admission.  Review  of Systems: As per HPI otherwise 10 point review of systems negative.    Past Medical History:  Diagnosis Date  . Anemia   . Arthritis   . Bronchitis 05/2014  . Cellulitis of lower extremity 06/2014   bilateral  . CHF (congestive heart failure) ([place])   . COPD (chronic obstructive pulmonary disease) ([place])   . Diabetes mellitus   . Diverticulitis   . Gallstone   . Hypertension   . MRSA (methicillin resistant Staphylococcus aureus)    has now "tested negative"- no open wounds as of 02-10-15  . Obesity   . On home O2   . Shortness of breath    with exertion   . Sleep apnea    cpap at 10   . Sleep apnea   . Thyroid disease   . Tubular adenoma of colon     Past Surgical History:  Procedure Laterality Date  . ABDOMINAL HYSTERECTOMY  1979  . ABDOMINAL HYSTERECTOMY  1978  . APPENDECTOMY    . APPLICATION OF WOUND VAC  wound vac  . APPLICATION OF WOUND VAC  06/26/2012   Procedure: APPLICATION OF WOUND VAC;  Surgeon: Gayland Curry, MD,FACS;  Location: [facility];  Service: General;;  . BACK SURGERY    . COLON RESECTION  10/09/2011   Procedure: COLON RESECTION;  Surgeon: Gayland Curry, MD;  Location: [place];  Service: General;  Laterality: N/A;  Colon Resection with colostomy  . COLON RESECTION  06/26/2012   Procedure: COLON RESECTION;  Surgeon: Randall Hiss  Ronnie Derby, MD,FACS;  Location: [facility];  Service: General;  Laterality: N/A;  . ESOPHAGOGASTRODUODENOSCOPY (EGD) WITH PROPOFOL N/A 02/17/2015   Procedure: ESOPHAGOGASTRODUODENOSCOPY (EGD) WITH PROPOFOL;  Surgeon: Jerene Bears, MD;  Location: [facility];  Service: Gastroenterology;  Laterality: N/A;  . jackson pratt    . PROCTOSCOPY  06/26/2012   Procedure: PROCTOSCOPY;  Surgeon: Gayland Curry, MD,FACS;  Location: [facility];  Service: General;  Laterality: N/A;  Rigid Proctoscopy     reports that she quit smoking about 7 years ago. She has never used smokeless tobacco. She reports that she does not drink alcohol or use drugs.  Allergies  Allergen  Reactions  . Adhesive [Tape] Hives and Rash  . Codeine Itching and Other (See Comments)  . Lovenox [Enoxaparin Sodium]     Local rash May 2019 w/ppx dosing  . Vancomycin Itching and Rash  . Zosyn [Piperacillin Sod-Tazobactam So] Itching and Rash    Family History  Problem Relation Age of Onset  . Heart disease Father   . Diabetes Father   . Hypertension Father   . Diabetes Mother   . Hypertension Mother   . Heart disease Brother   . Diabetes Brother   . Diabetes Sister   . Colon cancer Neg Hx   . Colon polyps Neg Hx   . Esophageal cancer Neg Hx   . Kidney disease Neg Hx   . Gallbladder disease Neg Hx     Prior to Admission medications   Medication Sig Start Date End Date Taking? Authorizing Provider  antiseptic oral rinse (BIOTENE) LIQD 15 mLs by Mouth Rinse route as needed for dry mouth.   Yes [provider]  aspirin 81 MG tablet Take 81 mg by mouth at bedtime.    Yes [provider]  budesonide-formoterol (SYMBICORT) 160-4.5 MCG/ACT inhaler Inhale 2 puffs into the lungs 2 (two) times daily.   Yes [provider]  Calcium Carb-Cholecalciferol (CALCIUM 600 + D PO) Take 2 tablets by mouth daily.   Yes [provider]  diphenhydrAMINE (BENADRYL) 25 mg capsule Take 25 mg by mouth every 6 (six) hours as needed for itching.   Yes [provider]  fluconazole (DIFLUCAN) 150 MG tablet Take 150 mg by mouth daily. TAKING 1 EVERY FOURTH DAY   Yes [provider]  furosemide (LASIX) 80 MG tablet Take 80 mg by mouth See admin instructions. Take 80 mg by mouth in the morning and 80 mg at 1:30 PM daily 12/25/17  Yes [provider]  glucosamine-chondroitin 500-400 MG tablet Take 1 tablet by mouth 2 (two) times daily.   Yes [provider]  ipratropium-albuterol (DUONEB) 0.5-2.5 (3) MG/3ML SOLN Take 3 mLs by nebulization every 4 (four) hours as needed (for wheezing or shortness of breath).  01/10/18  Yes [provider]  JANUVIA 100 MG tablet Take 100 mg by mouth daily.  11/11/14  Yes [provider]  levothyroxine (SYNTHROID, LEVOTHROID) 50 MCG tablet Take 50 mcg by mouth daily. 08/04/15  Yes [provider]  meloxicam (MOBIC) 7.5 MG tablet Take 7.5 mg by mouth daily.    Yes [provider]  metFORMIN (GLUCOPHAGE) 500 MG tablet Take 1,000 mg by mouth 2 (two) times daily with a meal.    Yes [provider]  montelukast (SINGULAIR) 10 MG tablet Take 10 mg by mouth daily.    Yes [provider]  polyethylene glycol (MIRALAX / GLYCOLAX) packet Take 17 g by mouth daily with lunch.  Yes [provider]  traMADol (ULTRAM) 50 MG tablet Take 50 mg by mouth every 4 (four) hours as needed (for pain).  01/09/12  Yes [provider]  Wheat Dextrin (BENEFIBER) POWD Take 15 g by mouth daily.    Yes [provider]  triamcinolone (KENALOG) 0.025 % cream Apply 1 application topically 2 (two) times daily. 01/23/18   Patrecia Pour, MD    Physical Exam: Vitals:   03/17/18 1200 03/17/18 1230 03/17/18 1300 03/17/18 1400  BP:  140/63 (!) 110/59 131/66  Pulse: 78 78 80 78  Resp: (!) 25 (!) 24 (!) 27 20  Temp:      TempSrc:      SpO2: (!) 87% 90% (!) 87% (!) 89%  Weight:        Constitutional: NAD, calm, comfortable Vitals:   03/17/18 1200 03/17/18 1230 03/17/18 1300 03/17/18 1400  BP:  140/63 (!) 110/59 131/66  Pulse: 78 78 80 78  Resp: (!) 25 (!) 24 (!) 27 20  Temp:      TempSrc:      SpO2: (!) 87% 90% (!) 87% (!) 89%  Weight:       Eyes: PERRL, lids and conjunctivae normal ENMT: Mucous membranes are moist. Posterior pharynx clear of any exudate or lesions.Normal dentition.  Neck: normal, supple, no masses, no thyromegaly Respiratory: Bilateral crackles, no wheezing. Normal respiratory effort. No accessory muscle use.  Cardiovascular: Regular rate and rhythm, no murmurs / rubs / gallops. 2+ extremity edema. 2+ pedal pulses. No carotid bruits.    Abdomen: no tenderness, no masses palpated. No hepatosplenomegaly. Bowel sounds positive.  Musculoskeletal: no clubbing / cyanosis. No joint deformity upper and lower extremities. Good ROM, no contractures. Normal muscle tone.  Skin: Induration noted in right buttocks.  There is breakdown of the skin with no purulent drainage noted.  Widespread erythema/irritation of the skin and perineal area.  Venous stasis changes in lower extremities bilaterally Neurologic: CN 2-12 grossly intact. Sensation intact, DTR normal. Strength 5/5 in all 4.  Psychiatric: Normal judgment and insight. Alert and oriented x 3. Normal mood.    Labs on Admission: I have personally reviewed following labs and imaging studies  CBC: Recent Labs  Lab 03/17/18 0943  WBC 14.1*  NEUTROABS 10.3*  HGB 13.0  HCT 43.1  MCV 82.1  PLT 734*   Basic Metabolic Panel: Recent Labs  Lab 03/17/18 0943  NA 137  K 4.3  CL 92*  CO2 34*  GLUCOSE 168*  BUN 31*  CREATININE 1.30*  CALCIUM 8.6*   GFR: Estimated Creatinine Clearance: 50.6 mL/min (A) (by C-G formula based on SCr of 1.3 mg/dL (H)). Liver Function Tests: No results for input(s): AST, ALT, ALKPHOS, BILITOT, PROT, ALBUMIN in the last 168 hours. No results for input(s): LIPASE, AMYLASE in the last 168 hours. No results for input(s): AMMONIA in the last 168 hours. Coagulation Profile: No results for input(s): INR, PROTIME in the last 168 hours. Cardiac Enzymes: Recent Labs  Lab 03/17/18 0943  TROPONINI <0.03   BNP (last 3 results) No results for input(s): PROBNP in the last 8760 hours. HbA1C: No results for input(s): HGBA1C in the last 72 hours. CBG: No results for input(s): GLUCAP in the last 168 hours. Lipid Profile: No results for input(s): CHOL, HDL, LDLCALC, TRIG, CHOLHDL, LDLDIRECT in the last 72 hours. Thyroid Function Tests: No results for input(s): TSH, T4TOTAL, FREET4, T3FREE, THYROIDAB in the last 72 hours. Anemia Panel: No results for  input(s): VITAMINB12, FOLATE,  FERRITIN, TIBC, IRON, RETICCTPCT in the last 72 hours. Urine analysis:    Component Value Date/Time   COLORURINE YELLOW 03/17/2018 [place] 03/17/2018 0943   LABSPEC 1.009 03/17/2018 0943   PHURINE 7.0 03/17/2018 0943   GLUCOSEU NEGATIVE 03/17/2018 0943   HGBUR NEGATIVE 03/17/2018 0943   BILIRUBINUR NEGATIVE 03/17/2018 0943   KETONESUR NEGATIVE 03/17/2018 0943   PROTEINUR NEGATIVE 03/17/2018 0943   UROBILINOGEN 0.2 06/25/2014 1823   NITRITE NEGATIVE 03/17/2018 0943   LEUKOCYTESUR NEGATIVE 03/17/2018 0943    Radiological Exams on Admission: Dg Chest 2 View  Result Date: 03/17/2018 CLINICAL DATA:  Shortness of breath, increased weight, pedal edema EXAM: CHEST - 2 VIEW COMPARISON:  01/17/2018 FINDINGS: There is bilateral diffuse mild interstitial thickening. There is no focal parenchymal opacity. There is no pleural effusion or pneumothorax. There is stable cardiomegaly. The osseous structures are unremarkable. IMPRESSION: Cardiomegaly with mild pulmonary vascular congestion. Electronically Signed   By: Kathreen Devoid   On: 03/17/2018 12:22    EKG: Independently reviewed. Sinus rhythm without acute changes  Assessment/Plan Active Problems:   OBSTRUCTIVE SLEEP APNEA   Hypertension   Diabetes mellitus (HCC)   Obesity, Class III, BMI 40-49.9 (morbid obesity) (HCC)   COPD (chronic obstructive pulmonary disease) (HCC)   Bilateral lower extremity edema   Acute on chronic diastolic CHF (congestive heart failure) (HCC)   CHF exacerbation (HCC)   Buttock wound, right, initial encounter   Hypothyroidism   CKD (chronic kidney disease) stage 3, GFR 30-59 ml/min ([place])     1. Acute on chronic diastolic congestive heart failure.  [name] reports compliance with medication as well as dietary restrictions since her last discharge.  May need to discharge on Demadex when she is adequately diuresis.  Restart on Lasix 80 mg IV twice daily. 2. COPD.   Continue on bronchodilators.  Will hold off on steroids for now.  Does not appear to have any wheezing. 3. Chronic respiratory failure with hypoxia.  She is chronically on 2.5 L of oxygen.  Continue to monitor. 4. Chronic kidney disease stage III.  Creatinine is currently stable.  Continue to monitor in the setting of diuresis. 5. Obstructive sleep apnea.  Continue on CPAP 6. Right buttock wound.  Seen by general surgery and no indication for surgical debridement.  Will request wound care consult.  Continue on Bactroban 7. Diabetes.  Hold oral agents.  Start on sliding scale insulin. 8. Hypothyroidism.  Continue on Synthroid.   DVT prophylaxis: lovenox Code Status: full code  Family Communication:  Family at bedside Disposition Plan: discharge home once volume status is better  Consults called: general surgery, wound care  Admission status: inpatient, telemetry   Kathie Dike MD [facility] Pager [phone]  If 7PM-7AM, please contact night-coverage [email or web address] Password [facility]  03/17/2018, 3:59 PM

## 2018-03-17 NOTE — ED Notes (Signed)
Patient's sister, Wilburn Cornelia, called and stated to tell patient she had a doctors appointment and would see patient later. States she wants to leave name and number to call if needed. Number is [phone]. Advised patient of message, patient verbalized understanding and states it is ok to discuss medical needs with sister.

## 2018-03-17 NOTE — ED Notes (Signed)
Patient states she takes a fluid pill and is requesting insertion of foley catheter. Placed purewick on patient. Patient tolerated well.

## 2018-03-17 NOTE — ED Triage Notes (Addendum)
Pt brought over from dr Hilma Favors due to abscess in right inner buttocks for approx 2 weeks. Pt also reports vaginal discharge and has been on medication for one month and still has discharge and vaginal itching  Pt has been taking diflucan po every 4 th day plus monistat. Pt also noted to have swollen and red lower legs. Pt states Dr Hilma Favors sent her to here to be admitted and started on IV antibiotics

## 2018-03-17 NOTE — ED Notes (Signed)
Patient's aide at bedside, states they weigh patient every day and this morning patient weighed 307 lbs. Advised Dr Elise Benne.

## 2018-03-18 DIAGNOSIS — R600 Localized edema: Secondary | ICD-10-CM | POA: Diagnosis not present

## 2018-03-18 DIAGNOSIS — S31819A Unspecified open wound of right buttock, initial encounter: Secondary | ICD-10-CM | POA: Diagnosis not present

## 2018-03-18 DIAGNOSIS — G4733 Obstructive sleep apnea (adult) (pediatric): Secondary | ICD-10-CM | POA: Diagnosis not present

## 2018-03-18 DIAGNOSIS — J449 Chronic obstructive pulmonary disease, unspecified: Secondary | ICD-10-CM

## 2018-03-18 DIAGNOSIS — I509 Heart failure, unspecified: Secondary | ICD-10-CM

## 2018-03-18 DIAGNOSIS — I5033 Acute on chronic diastolic (congestive) heart failure: Secondary | ICD-10-CM | POA: Diagnosis not present

## 2018-03-18 DIAGNOSIS — N183 Chronic kidney disease, stage 3 (moderate): Secondary | ICD-10-CM

## 2018-03-18 DIAGNOSIS — E039 Hypothyroidism, unspecified: Secondary | ICD-10-CM

## 2018-03-18 DIAGNOSIS — I10 Essential (primary) hypertension: Secondary | ICD-10-CM

## 2018-03-18 LAB — URINE CULTURE

## 2018-03-18 LAB — GLUCOSE, CAPILLARY
Glucose-Capillary: 149 mg/dL — ABNORMAL HIGH (ref 70–99)
Glucose-Capillary: 189 mg/dL — ABNORMAL HIGH (ref 70–99)

## 2018-03-18 LAB — BASIC METABOLIC PANEL
ANION GAP: 12 (ref 5–15)
BUN: 28 mg/dL — ABNORMAL HIGH (ref 8–23)
CHLORIDE: 95 mmol/L — AB (ref 98–111)
CO2: 34 mmol/L — ABNORMAL HIGH (ref 22–32)
Calcium: 8.2 mg/dL — ABNORMAL LOW (ref 8.9–10.3)
Creatinine, Ser: 1.21 mg/dL — ABNORMAL HIGH (ref 0.44–1.00)
GFR calc Af Amer: 48 mL/min — ABNORMAL LOW (ref >60)
GFR, EST NON AFRICAN AMERICAN: 42 mL/min — AB (ref >60)
Glucose, Bld: 130 mg/dL — ABNORMAL HIGH (ref 70–99)
POTASSIUM: 3.7 mmol/L (ref 3.5–5.1)
Sodium: 141 mmol/L (ref 135–145)

## 2018-03-18 LAB — MRSA PCR SCREENING: MRSA by PCR: POSITIVE — AB

## 2018-03-18 MED ORDER — NYSTATIN 100000 UNIT/GM EX POWD: Freq: Two times a day (BID) | CUTANEOUS | 0 refills | Status: DC

## 2018-03-18 MED ORDER — CHLORHEXIDINE GLUCONATE CLOTH 2 % EX PADS: 6.0000 | MEDICATED_PAD | Freq: Every day | CUTANEOUS | Status: DC

## 2018-03-18 MED ORDER — MUPIROCIN 2 % EX OINT: 1.0000 "application " | TOPICAL_OINTMENT | Freq: Two times a day (BID) | CUTANEOUS | Status: DC

## 2018-03-18 MED ORDER — MUPIROCIN CALCIUM 2 % EX CREA: TOPICAL_CREAM | CUTANEOUS | 0 refills | Status: DC

## 2018-03-18 NOTE — Discharge Instructions (Signed)
WOUND CARE INSTRUCTIONS: APPLY Bactrim cream to the right buttock wound twice a day.  Continue topical antifungal cream in the groin crease and perineum.   Follow with Primary MD  Sharilyn Sites, MD  and other consultant's as instructed your Hospitalist MD  Please get a complete blood count and chemistry panel checked by your Primary MD at your next visit, and again as instructed by your Primary MD.  Get Medicines reviewed and adjusted: Please take all your medications with you for your next visit with your Primary MD  Laboratory/radiological data: Please request your Primary MD to go over all hospital tests and procedure/radiological results at the follow up, please ask your Primary MD to get all Hospital records sent to his/her office.  In some cases, they will be blood work, cultures and biopsy results pending at the time of your discharge. Please request that your primary care M.D. follows up on these results.  Also Note the following: If you experience worsening of your admission symptoms, develop shortness of breath, life threatening emergency, suicidal or homicidal thoughts you must seek medical attention immediately by calling 911 or calling your MD immediately  if symptoms less severe.  You must read complete instructions/literature along with all the possible adverse reactions/side effects for all the Medicines you take and that have been prescribed to you. Take any new Medicines after you have completely understood and accpet all the possible adverse reactions/side effects.   Do not drive when taking Pain medications or sleeping medications (Benzodaizepines)  Do not take more than prescribed Pain, Sleep and Anxiety Medications. It is not advisable to combine anxiety,sleep and pain medications without talking with your primary care practitioner  Special Instructions: If you have smoked or chewed Tobacco  in the last 2 yrs please stop smoking, stop any regular Alcohol  and or any  Recreational drug use.  Wear Seat belts while driving.  Please note: You were cared for by a hospitalist during your hospital stay. Once you are discharged, your primary care physician will handle any further medical issues. Please note that NO REFILLS for any discharge medications will be authorized once you are discharged, as it is imperative that you return to your primary care physician (or establish a relationship with a primary care physician if you do not have one) for your post hospital discharge needs so that they can reassess your need for medications and monitor your lab values.      Heart Failure Heart failure means your heart has trouble pumping blood. This makes it hard for your body to work well. Heart failure is usually a long-term (chronic) condition. You must take good care of yourself and follow your doctor's treatment plan. Follow these instructions at home:  Take your heart medicine as told by your doctor. ? Do not stop taking medicine unless your doctor tells you to. ? Do not skip any dose of medicine. ? Refill your medicines before they run out. ? Take other medicines only as told by your doctor or pharmacist.  Stay active if told by your doctor. The elderly and people with severe heart failure should talk with a doctor about physical activity.  Eat heart-healthy foods. Choose foods that are without trans fat and are low in saturated fat, cholesterol, and salt (sodium). This includes fresh or frozen fruits and vegetables, fish, lean meats, fat-free or low-fat dairy foods, whole grains, and high-fiber foods. Lentils and dried peas and beans (legumes) are also good choices.  Limit salt if told  by your doctor.  Cook in a healthy way. Roast, grill, broil, bake, poach, steam, or stir-fry foods.  Limit fluids as told by your doctor.  Weigh yourself every morning. Do this after you pee (urinate) and before you eat breakfast. Write down your weight to give to your  doctor.  Take your blood pressure and write it down if your doctor tells you to.  Ask your doctor how to check your pulse. Check your pulse as told.  Lose weight if told by your doctor.  Stop smoking or chewing tobacco. Do not use gum or patches that help you quit without your doctor's approval.  Schedule and go to doctor visits as told.  Nonpregnant women should have no more than 1 drink a day. Men should have no more than 2 drinks a day. Talk to your doctor about drinking alcohol.  Stop illegal drug use.  Stay current with shots (immunizations).  Manage your health conditions as told by your doctor.  Learn to manage your stress.  Rest when you are tired.  If it is really hot outside: ? Avoid intense activities. ? Use air conditioning or fans, or get in a cooler place. ? Avoid caffeine and alcohol. ? Wear loose-fitting, lightweight, and light-colored clothing.  If it is really cold outside: ? Avoid intense activities. ? Layer your clothing. ? Wear mittens or gloves, a hat, and a scarf when going outside. ? Avoid alcohol.  Learn about heart failure and get support as needed.  Get help to maintain or improve your quality of life and your ability to care for yourself as needed. Contact a doctor if:  You gain weight quickly.  You are more short of breath than usual.  You cannot do your normal activities.  You tire easily.  You cough more than normal, especially with activity.  You have any or more puffiness (swelling) in areas such as your hands, feet, ankles, or belly (abdomen).  You cannot sleep because it is hard to breathe.  You feel like your heart is beating fast (palpitations).  You get dizzy or light-headed when you stand up. Get help right away if:  You have trouble breathing.  There is a change in mental status, such as becoming less alert or not being able to focus.  You have chest pain or discomfort.  You faint. This information is not  intended to replace advice given to you by your health care provider. Make sure you discuss any questions you have with your health care provider. Document Released: 06/19/2008 Document Revised: 02/16/2016 Document Reviewed: 10/27/2012 Elsevier Interactive Patient Education  2017 [place].   Heart Failure Eating Plan Heart failure, also called congestive heart failure, occurs when your heart does not pump blood well enough to meet your body's needs for oxygen-rich blood. Heart failure is a long-term (chronic) condition. Living with heart failure can be challenging. However, following your health care provider's instructions about a healthy lifestyle and working with a diet and nutrition specialist (dietitian) to choose the right foods may help to improve your symptoms. What are tips for following this plan? General guidelines  Do not eat more than 2,300 mg of salt (sodium) a day. The amount of sodium that is recommended for you may be lower, depending on your condition.  Maintain a healthy body weight as directed. Ask your health care provider what a healthy weight is for you. ? Check your weight every day. ? Work with your health care provider and dietitian to make  a plan that is right for you to lose weight or maintain your current weight.  Limit how much fluid you drink. Ask your health care provider or dietitian how much fluid you can have each day.  Limit or avoid alcohol as told by your health care provider or dietitian. Reading food labels  Check food labels for the amount of sodium per serving. Choose foods that have less than 140 mg (milligrams) of sodium in each serving.  Check food labels for the number of calories per serving. This is important if you need to limit your daily calorie intake to lose weight.  Check food labels for the serving size. If you eat more than one serving, you will be eating more sodium and calories than what is listed on the label.  Look for foods  that are labeled as "sodium-free," "very low sodium," or "low sodium." ? Foods labeled as "reduced sodium" or "lightly salted" may still have more sodium than what is recommended for you. Cooking  Avoid adding salt when cooking. Ask your health care provider or dietitian before using salt substitutes.  Season food with salt-free seasonings, spices, or herbs. Check the label of seasoning mixes to make sure they do not contain salt.  Cook with heart-healthy oils, such as olive, canola, soybean, or sunflower oil.  Do not fry foods. Cook foods using low-fat methods, such as baking, boiling, grilling, and broiling.  Limit unhealthy fats when cooking by: ? Removing the skin from poultry, such as chicken. ? Removing all visible fats from meats. ? Skimming the fat off from stews, soups, and gravies before serving them. Meal planning  Limit your intake of: ? Processed, canned, or pre-packaged foods. ? Foods that are high in trans fat, such as fried foods. ? Sweets, desserts, sugary drinks, and other foods with added sugar. ? Full-fat dairy products, such as whole milk.  Eat a balanced diet that includes: ? 4-5 servings of fruit each day and 4-5 servings of vegetables each day. At each meal, try to fill half of your plate with fruits and vegetables. ? Up to 6-8 servings of whole grains each day. ? Up to 2 servings of lean meat, poultry, or fish each day. One serving of meat is equal to 3 oz. This is about the same size as a deck of cards. ? 2 servings of low-fat dairy each day. ? Heart-healthy fats. Healthy fats called omega-3 fatty acids are found in foods such as flaxseed and cold-water fish like sardines, salmon, and mackerel.  Aim to eat 25-35 g (grams) of fiber a day. Foods that are high in fiber include apples, broccoli, carrots, beans, peas, and whole grains.  Do not add salt or condiments that contain salt (such as soy sauce) to foods before eating.  When eating at a restaurant, ask  that your food be prepared with less salt or no salt, if possible.  Try to eat 2 or more vegetarian meals each week.  Eat more home-cooked food and eat less restaurant, buffet, and fast food. Recommended foods The items listed may not be a complete list. Talk with your dietitian about what dietary choices are best for you. Grains Bread with less than 80 mg of sodium per slice. Whole-wheat pasta, quinoa, and brown rice. Oats and oatmeal. Barley. [place]. Grits and cream of wheat. Whole-grain and whole-wheat cold cereal. Vegetables All fresh vegetables. Vegetables that are frozen without sauce or added salt. Low-sodium or sodium-free canned vegetables. Fruits All fresh, frozen, and canned  fruits. Dried fruits, such as raisins, prunes, and cranberries. Meats and other protein foods Lean cuts of meat. Skinless chicken and [place]. Fish with high omega-3 fatty acids, such as salmon, sardines, and other cold-water fishes. Eggs. Dried beans, peas, and edamame. Unsalted nuts and nut butters. Dairy Low-fat or nonfat (skim) milk and dried milk. Rice milk, soy milk, and almond milk. Low-fat or nonfat yogurt. Small amounts of reduced-sodium block cheese. Low-sodium cottage cheese. Fats and oils Olive, canola, soybean, flaxseed, or sunflower oil. Avocado. Sweets and desserts Apple sauce. Granola bars. Sugar-free pudding and gelatin. Frozen fruit bars. Seasoning and other foods Fresh and dried herbs. Lemon or lime juice. Vinegar. Low-sodium ketchup. Salt-free marinades, salad dressings, sauces, and seasonings. Foods to avoid The items listed may not be a complete list. Talk with your dietitian about what dietary choices are best for you. Grains Bread with more than 80 mg of sodium per slice. Hot or cold cereal with more than 140 mg sodium per serving. Salted pretzels and crackers. Pre-packaged breadcrumbs. Bagels, croissants, and biscuits. Vegetables Canned vegetables. Frozen vegetables with sauce or  seasonings. Creamed vegetables. [place] fries. Onion rings. Pickled vegetables and sauerkraut. Fruits Fruits that are dried with sodium-containing preservatives. Meats and other protein foods Ribs and chicken wings. Bacon, ham, pepperoni, bologna, salami, and packaged luncheon meats. Hot dogs, bratwurst, and sausage. Canned meat. Smoked meat and fish. Salted nuts and seeds. Dairy Whole milk, half-and-half, and cream. Buttermilk. Processed cheese, cheese spreads, and cheese curds. Regular cottage cheese. Feta cheese. Shredded cheese. String cheese. Fats and oils Butter, lard, shortening, ghee, and bacon fat. Canned and packaged gravies. Seasoning and other foods Onion salt, garlic salt, table salt, and sea salt. Marinades. Regular salad dressings. Relishes, pickles, and olives. Meat flavorings and tenderizers, and bouillon cubes. Horseradish, ketchup, and mustard. Worcestershire sauce. Teriyaki sauce, soy sauce (including reduced sodium). Hot sauce and Tabasco sauce. Steak sauce, fish sauce, oyster sauce, and cocktail sauce. Taco seasonings. Barbecue sauce. Tartar sauce. Summary  A heart failure eating plan includes changes that limit your intake of sodium and unhealthy fat, and it may help you lose weight or maintain a healthy weight. Your health care provider may also recommend limiting how much fluid you drink.  Most people with heart failure should eat no more than 2,300 mg of salt (sodium) a day. The amount of sodium that is recommended for you may be lower, depending on your condition.  Contact your health care provider or dietitian before making any major changes to your diet. This information is not intended to replace advice given to you by your health care provider. Make sure you discuss any questions you have with your health care provider. Document Released: 01/25/2017 Document Revised: 01/25/2017 Document Reviewed: 01/25/2017 Elsevier Interactive Patient Education  2018 [place].   Heart Failure Action Plan A heart failure action plan helps you understand what to do when you have symptoms of heart failure. Follow the plan that was created by you and your health care provider. Review your plan each time you visit your health care provider. Red zone These signs and symptoms mean you should get medical help right away:  You have trouble breathing when resting.  You have a dry cough that is getting worse.  You have swelling or pain in your legs or abdomen that is getting worse.  You suddenly gain more than 2-3 lb (0.9-1.4 kg) in a day, or more than 5 lb (2.3 kg) in one week. This amount may be  more or less depending on your condition.  You have trouble staying awake or you feel confused.  You have chest pain.  You do not have an appetite.  You pass out.  If you experience any of these symptoms:  Call your local emergency services (911 in the [place] right away or seek help at the emergency department of the nearest hospital.  Yellow zone These signs and symptoms mean your condition may be getting worse and you should make some changes:  You have trouble breathing when you are active or you need to sleep with extra pillows.  You have swelling in your legs or abdomen.  You gain 2-3 lb (0.9-1.4 kg) in one day, or 5 lb (2.3 kg) in one week. This amount may be more or less depending on your condition.  You get tired easily.  You have trouble sleeping.  You have a dry cough.  If you experience any of these symptoms:  Contact your health care provider within the next day.  Your health care provider may adjust your medicines.  Green zone These signs mean you are doing well and can continue what you are doing:  You do not have shortness of breath.  You have very little swelling or no new swelling.  Your weight is stable (no gain or loss).  You have a normal activity level.  You do not have chest pain or any other new symptoms.  Follow these  instructions at home:  Take over-the-counter and prescription medicines only as told by your health care provider.  Weigh yourself daily. Your target weight is __________ lb (__________ kg). ? Call your health care provider if you gain more than __________ lb (__________ kg) in a day, or more than __________ lb (__________ kg) in one week.  Eat a heart-healthy diet. Work with a diet and nutrition specialist (dietitian) to create an eating plan that is best for you.  Keep all follow-up visits as told by your health care provider. This is important. Where to find more information:  American Heart Association: www.heart.org Summary  Follow the action plan that was created by you and your health care provider.  Get help right away if you have any symptoms in the Red zone. This information is not intended to replace advice given to you by your health care provider. Make sure you discuss any questions you have with your health care provider. Document Released: 10/20/2016 Document Revised: 10/20/2016 Document Reviewed: 10/20/2016 Elsevier Interactive Patient Education  2018 [place].   Heart Failure Medicines Heart failure is a condition in which the heart cannot pump enough blood through the body. This can cause symptoms such as shortness of breath, fatigue, and confusion. There are two types of heart failure:  Heart failure with reduced ejection fraction. In this type, the heart muscle is weak.  Heart failure with preserved ejection fraction. In this type, the heart muscle does not fill with blood properly and may be stiff.  There is no cure for heart failure. However, being treated with medicines and following your health care provider's instructions about a healthy lifestyle can help you stay active, avoid problems, and live longer. Talk to your health care provider about all medicines that you are taking, how often you should take them, and what possible problems (side effects)  they may cause. Talk with your health care provider if you have difficulty affording your medicines. What are some common medicines for heart failure? The medicines that are prescribed for you will  depend on your symptoms, the type of heart failure you have, and the cause of your heart failure. In some cases, you may need to take more than one medicine. You will be prescribed the following medicines according to your type of heart failure: Heart failure with reduced ejection fraction  Beta-blockers.  Angiotensin-converting enzyme (ACE) inhibitors.  Angiotensin II receptor blockers (ARBs).  Angiotensin receptor neprilysin inhibitors (ARNIs).  Aldosterone antagonists.  Diuretics.  Digoxin.  Nitrates. Heart failure with preserved ejection fraction  Medicines to control blood pressure, including: ? Beta-blockers. ? Angiotensin-converting enzyme (ACE) inhibitors. ? Angiotensin II receptor blockers (ARBs).  Diuretics.  Aldosterone antagonists. What should I know about beta-blockers?  These medicines lower your blood pressure and slow your heart rate. This helps to lessen your heart's workload.  They can help to relieve chest pain (angina).  They can help to improve your heart's ability to pump.  They may cause asthma attacks and shortness of breath.  Because these medicines slow your heart rate, it is important not to overwork yourself while exercising. Talk to your health care provider about what your target heart rate should be while you exercise.  These medicines can hide the symptoms of low blood sugar (glucose), which is also called hypoglycemia. If you have diabetes, make sure to check your blood glucose carefully. If you have hypoglycemia, talk to your health care provider about adjusting your medicines.  Beta-blockers may make you feel dizzy or light-headed at first. Do not drive or use heavy machinery when you first start these medicines. Ask your health care provider  when it is safe for you to drive. What should I know about ACE inhibitors or ARBs?  These medicines help to widen arteries and veins. This action lowers your blood pressure and lessens the strain on your heart, making it easier for your heart to pump.  They can help to lessen the symptoms of heart failure.  ARBs are often used if a person cannot take ACE inhibitors.  ACE inhibitors may cause a dry cough.  In rare cases, ACE inhibitors and ARBs can cause swelling of the tongue or lips, other swelling, taste problems, and skin rashes. If these symptoms occur, stop taking the medicines and contact your health care provider.  Do not take ACE inhibitors if you are pregnant or may become pregnant. These medicines can cause health problems in an unborn baby.  These medicines may cause dizziness. You may need regular checkups and blood tests to monitor how they are working. What should I know about ARNIs?  These medicines are a combination of an ARB and another medicine. They lower your blood pressure.  Side effects may include dry cough, dizziness, low blood pressure, and kidney problems.  Do not take ARNIs if you are already taking ACE inhibitors or ARBs.  You may notice increased urination when taking these medicines.  ARNIs can raise the amount of potassium in the blood. Your potassium levels will be monitored regularly by your health care provider. What should I know about aldosterone antagonists?  They help the body to remove excess sodium through urination. This helps to lessen the amount of blood that the heart needs to pump.  They can also help to lower blood pressure and improve the heart's ability to pump blood.  They may cause dizziness, diarrhea, coughing, or flu-like symptoms.  They should not be used if you have type 2 diabetes.  They can raise the amount of potassium in the blood. Your potassium levels will  be monitored regularly by your health care provider.  These  medicines can make men's breasts large and tender. What should I know about diuretics?  Diuretics are medicines that help the body get rid of excess fluid through urination. They can also help lessen your heart's workload.  They help to lessen fluid buildup in the lungs, ankles, and feet.  They help to lower your blood pressure.  They can worsen problems with controlling urination (urinary incontinence).  They may cause dizziness, headaches, muscle cramps, and an upset stomach.  They can cause weak muscles, dry mouth, or confusion. It is important to drink plenty of fluids while taking these medicines, especially while exercising or on hot days. What should I know about digoxin?  Digoxin helps the heart pump more blood efficiently. It also lowers your heart rate.  It can help ease heart failure symptoms and may be used if other medicines do not work.  It can also help with irregular heartbeat (arrhythmia).  It may cause stomach problems, fatigue, headache, drowsiness, or vision problems. What should I know about nitrates?  Nitrates relax the blood vessels and increase oxygen and blood supply to the heart. They also lower the blood pressure.  They are usually taken to lessen chest pain.  They may cause headaches, flushing, or irregular heartbeat. Summary  A healthy lifestyle and treatment with medicine will relieve symptoms of heart failure.  In some cases, you may need to take more than one medicine.  It is important to talk to your health care provider about how often you should take your medicines. Do not skip a dose or change your dosage.  Talk to your heaLth care provider about possible side effects of these medicines. This information is not intended to replace advice given to you by your health care provider. Make sure you discuss any questions you have with your health care provider. Document Released: 01/25/2017 Document Revised: 01/25/2017 Document Reviewed:  01/25/2017 Elsevier Interactive Patient Education  2018 [place] With Heart Failure  Heart failure is a long-term (chronic) condition in which the heart cannot pump enough blood through the body. When this happens, parts of the body do not get the blood and oxygen they need. There is no cure for heart failure at this time, so it is important for you to take good care of yourself and follow the treatment plan set by your health care provider. If you are living with heart failure, there are ways to help you manage the disease. Follow these instructions at home: Living with heart failure requires you to make changes in your life. Your health care team will teach you about the changes you need to make in order to relieve your symptoms and lower your risk of going to the hospital. Follow the treatment plan as set by your health care provider. Medicines Medicines are important in reducing your heart's workload, slowing the progression of heart failure, and improving your symptoms.  Take over-the-counter and prescription medicines only as told by your health care provider.  Do not stop taking your medicine unless your health care provider tells you to do that.  Do not skip any dose of your medicine.  Refill prescriptions before you run out of medicine. You need your medicines every day.  Eating and drinking  Eat heart-healthy foods. Talk with a dietitian to make an eating plan that is right for you. ? If directed by your health care provider: ? Limit salt (sodium). Lowering your sodium  intake may reduce symptoms of heart failure. Ask a dietitian to recommend heart-healthy seasonings. ? Limit your fluid intake. Fluid restriction may reduce symptoms of heart failure. ? Use low-fat cooking methods instead of frying. Low-fat methods include roasting, grilling, broiling, baking, poaching, steaming, and stir-frying. ? Choose foods that contain no trans fat and are low in saturated fat  and cholesterol. Healthy choices include fresh or frozen fruits and vegetables, fish, lean meats, legumes, fat-free or low-fat dairy products, and whole-grain or high-fiber foods.  Limit alcohol intake to no more than 1 drink a day for nonpregnant women and 2 drinks a day for men. One drink equals 12 oz of beer, 5 oz of wine, or 1 oz of hard liquor. ? Drinking more than that is harmful to your heart. Tell your health care provider if you drink alcohol several times a week. ? Talk with your health care provider about whether any level of alcohol use is safe for you. Activity  Ask your health care provider about attending cardiac rehabilitation. These programs include aerobic physical activity, which provides many benefits for your heart.  If no cardiac rehabilitation program is available, ask your health care provider what aerobic exercises are safe for you to do. Lifestyle Make the lifestyle changes recommended by your health care provider. In general:  Lose weight if your health care provider tells you to do that. Weight loss may reduce symptoms of heart failure.  Do not use any products that contain nicotine or tobacco, such as cigarettes or e-cigarettes. If you need help quitting, ask your health care provider.  Do not use street (illegal) drugs.  Return to your normal activities as told by your health care provider. Ask your health care provider what activities are safe for you.  General instructions  Make sure you weigh yourself every day to track your weight. Rapid weight gain may indicate an increase in fluid in your body and may increase the workload of your heart. ? Weigh yourself every morning. Do this after you urinate but before you eat breakfast. ? Wear the same type of clothing, without shoes, each time you weigh yourself. ? Weigh yourself on the same scale and in the same spot each time.  Living with chronic heart failure often leads to emotions such as fear, stress,  anxiety, and depression. If you feel any of these emotions and need help coping, contact your health care provider. Other ways to get help include: ? Talking to friends and family members about your condition. They can give you support and guidance. Explain your symptoms to them and, if comfortable, invite them to attend appointments or rehabilitation with you. ? Joining a support group for people with chronic heart failure. Talking with other people who have the same symptoms may give you new ways of coping with your disease and your emotions.  Stay up to date with your shots (vaccines). Staying current on pneumococcal and influenza vaccines is especially important in preventing germs from attacking your airways (respiratory infections).  Keep all follow-up visits as told by your health care provider. This is important. How to recognize changes in your condition You and your family members need to know what changes to watch for in your condition. Watch for the following changes and report them to your health care provider:  Sudden weight gain. Ask your health care provider what amount of weight gain to report.  Shortness of breath: ? Feeling short of breath while at rest, with no exercise or activity  that required great effort. ? Feeling breathless with activity.  Swelling of your lower legs or ankles.  Difficulty sleeping: ? You wake up feeling short of breath. ? You have to use more pillows to raise your head in order to sleep.  Frequent, dry, hacking cough.  Loss of appetite.  Feeling more tired all the time.  Depression or feelings of sadness or hopelessness.  Bloating in the stomach.  Where to find more information  Local support groups. Ask your health care provider about groups near you.  The American Heart Association: www.heart.org Contact a health care provider if:  You have a rapid weight gain.  You have increasing shortness of breath that is unusual for  you.  You are unable to participate in your usual physical activities.  You tire easily.  You cough more than normal, especially with physical activity.  You have any swelling or more swelling in areas such as your hands, feet, ankles, or abdomen.  You feel like your heart is beating quickly (palpitations).  You become dizzy or light-headed when you stand up. Get help right away if:  You have difficulty breathing.  You notice or your family notices a change in your awareness, such as having trouble staying awake or having difficulty with concentration.  You have pain or discomfort in your chest.  You have an episode of fainting (syncope). Summary  There is no cure for heart failure, so it is important for you to take good care of yourself and follow the treatment plan set by your health care provider.  Medicines are important in reducing your heart's workload, slowing the progression of heart failure, and improving your symptoms.  Living with chronic heart failure often leads to emotions such as fear, stress, anxiety, and depression. If you are feeling any of these emotions and need help coping, contact your health care provider. This information is not intended to replace advice given to you by your health care provider. Make sure you discuss any questions you have with your health care provider. Document Released: 01/23/2017 Document Revised: 01/23/2017 Document Reviewed: 01/23/2017 Elsevier Interactive Patient Education  2018 [place].   Preventing Heart Failure Heart failure is a condition in which the heart has trouble pumping blood. This may mean that the heart cannot pump enough blood out to the body, or that the heart does not fill up with enough blood. Either of those problems can lead to symptoms such as fatigue, trouble breathing, and swelling throughout the body. This is a common medical condition that affects not only the heart, but the entire body. Making  certain nutrition and lifestyle changes can help you prevent heart failure and avoid serious health problems. What nutrition changes can be made?  If you are overweight or obese, reduce how many calories you eat each day so that you lose weight. Work with your health care provider or a diet and nutrition specialist (dietitian) to determine how many calories you need each day.  Eat foods that are low in salt (sodium). Avoid adding extra salt to foods.  Eat a well-balanced diet that includes a lot of: ? Fresh fruits and vegetables. ? Whole grains. ? Lean meats. ? Beans. ? Fat-free or low-fat dairy products.  Avoid foods that contain a lot of: ? Trans fats. ? Saturated fats. ? Sugar. ? Cholesterol. What lifestyle changes can be made?  Do not use any products that contain nicotine or tobacco, such as cigarettes and e-cigarettes. If you need help quitting or  reducing how much you smoke, ask your health care provider.  Stop using alcohol, or limit alcohol intake to no more than 1 drink a day for nonpregnant women and 2 drinks a day for men. One drink equals 12 oz of beer, 5 oz of wine, or 1 oz of hard liquor.  Exercise for at least 150 minutes each week, or as much as told by your health care provider. ? Do moderate-intensity exercise, such as brisk walking, bicycling, or water aerobics. ? Ask your health care provider which activities are safe for you.  See a health care provider regularly for screening and wellness checks. Know your heart health indicators, such as: ? Blood pressure. ? Cholesterol levels. ? Blood sugar (glucose) levels. ? Weight and BMI.  If you have diabetes, manage your condition and follow your treatment plan as instructed.  Try to get 7-9 hours of sleep each night. To help with sleep: ? Keep your bedroom cool and dark. ? Do not eat a heavy meal during the hour before you go to bed. ? Do not drink alcohol or caffeinated drinks before bed. ? Avoid screen time  before bedtime. This means avoiding television, computers, tablets, and cell phones.  Find ways to relax and manage stress. These may include: ? Breathing exercises. ? Meditation. ? Yoga. ? Listening to music. Why are these changes important?  A well-balanced diet with the appropriate amount of calories can keep your body weight at a healthy level, which reduces strain on your heart.  A low-sodium diet can help keep your blood pressure in a normal range and keep your blood vessels working properly.  Quitting smoking and limiting alcohol intake can reduce harmful effects that these substances have on your heart and blood vessels.  Regular exercise can keep your heart strong so it can pump blood normally.  Managing diabetes helps your blood circulate and can help you maintain a healthy weight.  Managing stress helps to reduce the risk of high blood pressure and heart problems. What can happen if changes are not made? Heart failure can cause very serious problems that may get worse over time, such as:  Extreme fatigue during normal physical activities.  Shortness of breath or trouble breathing.  Swelling in your abdomen, legs, ankles, feet, or neck.  Fluid buildup throughout the body.  Weight gain.  Cough.  Frequent urination.  What can I do to lower my risk? You may be able to lower your risk of heart failure by:  Losing weight or keeping your weight under control.  Working with your health care provider to manage your: ? Cholesterol. ? Blood pressure. ? Diabetes, if this applies.  Eating a healthy diet.  Exercising regularly.  Avoiding unhealthy habits, such as smoking, drinking, or using drugs.  Getting plenty of sleep.  Managing your stress.  How is this treated? Heart failure cannot be cured except by heart transplant, but treatment can help to improve your quality of life. Treatment may include:  Medicines to help: ? Lower blood pressure. ? Remove  excess sodium from your body. ? Relax blood vessels. ? Improve heart function. ? Control other symptoms of heart failure.  Surgery to open blocked coronary arteries or repair damaged heart valves.  Implantation of a biventricular pacemaker to improve heart muscle function (cardiac resynchronization therapy). This device paces both the right ventricle and left ventricle.  Implantation of a device to treat serious abnormal heart rhythms (implantable cardioverter defibrillator, ICD).  Implantation of a mechanical heart pump  to improve the pumping ability of your heart (left ventricular assist device, LVAD).  Heart transplant. This treatment is considered for certain people who do not improve with other treatments.  Where to find more information:  National Heart, Lung, and Blood Institute: ClickDebate.gl  Centers for Disease Control and Prevention: LawyerNetworking.com.cy  [facility]: https://www.[place].info/  American Heart Association: ReligiousCamps.at.jsp Contact a health care provider if:  You have rapid weight gain.  You have increasing shortness of breath that is unusual for you.  You tire easily, or you are unable to participate in your usual activities.  You cough more than normal, especially with physical activity.  You have any swelling or more swelling in areas such as your hands, feet, ankles, or abdomen. Summary  Heart failure can be prevented by making changes to your diet and your lifestyle.  It is important to eat a healthy diet, manage your weight, exercise regularly, manage stress, avoid drugs and alcohol, and keep your cholesterol and blood pressure under control.  Heart failure can cause very serious problems over time. This information is not intended to replace advice  given to you by your health care provider. Make sure you discuss any questions you have with your health care provider. Document Released: 05/01/2016 Document Revised: 11/27/2016 Document Reviewed: 05/01/2016 Elsevier Interactive Patient Education  2018 [place] of Breath, Adult Shortness of breath means you have trouble breathing. Your lungs are organs for breathing. Follow these instructions at home: Pay attention to any changes in your symptoms. Take these actions to help with your condition:  Do not smoke. Smoking can cause shortness of breath. If you need help to quit smoking, ask your doctor.  Avoid things that can make it harder to breathe, such as: ? Mold. ? Dust. ? Air pollution. ? Chemical smells. ? Things that can cause allergy symptoms (allergens), if you have allergies.  Keep your living space clean and free of mold and dust.  Rest as needed. Slowly return to your usual activities.  Take over-the-counter and prescription medicines, including oxygen and inhaled medicines, only as told by your doctor.  Keep all follow-up visits as told by your doctor. This is important.  Contact a doctor if:  Your condition does not get better as soon as expected.  You have a hard time doing your normal activities, even after you rest.  You have new symptoms. Get help right away if:  You have trouble breathing when you are resting.  You feel light-headed or you faint.  You have a cough that is not helped by medicines.  You cough up blood.  You have pain with breathing.  You have pain in your chest, arms, shoulders, or belly (abdomen).  You have a fever.  You cannot walk up stairs.  You cannot exercise the way you normally do. This information is not intended to replace advice given to you by your health care provider. Make sure you discuss any questions you have with your health care provider. Document Released: 02/27/2008 Document Revised: 09/27/2016  Document Reviewed: 09/27/2016 Elsevier Interactive Patient Education  2017 [employer].

## 2018-03-18 NOTE — Progress Notes (Signed)
Patient states understanding of discharge instructions.  

## 2018-03-18 NOTE — Discharge Summary (Signed)
Physician Discharge Summary  [name] JHE:[id number] DOB: [birth year] DOA: 03/17/2018  PCP: Sharilyn Sites, MD Cardiologist: Dr. Gwenlyn Found  Admit date: 03/17/2018 Discharge date: 03/18/2018  Admitted From: HOME  Disposition: HOME   Recommendations for Outpatient Follow-up:  1. Follow up with PCP in 1 weeks 2. Follow up with cardiology in 2 weeks 3. Please obtain BMP/CBC in 1-2 weeks  Home Health: RN for complex wound care   Discharge Condition: STABLE  CODE STATUS: FULL    Brief Hospitalization Summary: Please see all hospital notes, images, labs for full details of the hospitalization.  HPI: [name] is a 77 y.o. female with medical history significant of COPD, CHF, chronic respiratory failure on home oxygen, obstructive sleep apnea, diabetes, who appears to have poor functional capacity at baseline.  Patient was receiving the hospital and was discharged in early May after being treated for volume overload from congestive heart failure.  She reports that since her discharge, she has been gradually having worsening edema and increased weight.  She reports compliance with medications and salt intake.  She reports that she is chronically short of breath and at baseline has difficulty walking from her bedroom to the bathroom.  Over the past few weeks, she has developed an ulcer on her right buttocks.  Denies any fever.  She does have some tenderness in the right buttocks.  She does report some drainage.  She has complained of a fungal rash in her perineal area for which she is been taking Diflucan.  She went to her primary care physician today for evaluation of this wound and worsening lower extremity edema and was sent to the ER for evaluation.  ED Course: In the emergency room, she was noted to have increased respiratory effort.  Blood pressures have been labile.  She was noted to be in volume overload and received a dose of intravenous Lasix.  Chest x-ray showed evidence of volume overload.   Blood work was relatively unrevealing.  EKG did not show any acute changes.  Due to concerns of infection of her right buttocks, general surgery evaluate the patient did not feel that she needed any surgical intervention at this time.  Local wound care was recommended.  She is been referred for admission.   1. Acute on chronic diastolic congestive heart failure.  Patient reports compliance with medication as well as dietary restrictions since her last discharge.  She was treated with Lasix 80 mg IV twice daily with good results. She diuresed and felt much better and felt stable to go home. She will follow up with her cardiologist Dr. Gwenlyn Found in 2 weeks.  Pt advised to follow diet and take medications as prescribed.  She verbalized understanding. Resume home lasix 80 mg BID until she sees her cardiologist. 2. COPD.  Continue on bronchodilators.   Resume home respiratory medications. 3. Chronic respiratory failure with hypoxia.  Oxygen was increased to 3.5 L. 4. Chronic kidney disease stage III.  Creatinine is currently stable.  Continue to monitor in the setting of diuresis. STOP MOBIC.  Discuss with PCP about stopping metformin.  5. Obstructive sleep apnea.  Continue on CPAP.  Follow up with sleep doctor.  6. Right buttock wound.  Seen by general surgery and no indication for surgical debridement.  Continue on Bactroban. Home health RN requested for complex wound care.   7. Diabetes.  Held oral agents in hospital.   8. Hypothyroidism.  Continue on Synthroid.  DVT prophylaxis: lovenox Code Status: full code  Family Communication:  Family at bedside Disposition Plan: discharge home once volume status is better  Consults called: general surgery, wound care  Admission status: inpatient, telemetry   Discharge Diagnoses:  Active Problems:   OBSTRUCTIVE SLEEP APNEA   Hypertension   Diabetes mellitus (HCC)   Obesity, Class III, BMI 40-49.9 (morbid obesity) (HCC)   COPD (chronic obstructive pulmonary  disease) (HCC)   Bilateral lower extremity edema   Acute on chronic diastolic CHF (congestive heart failure) (HCC)   CHF exacerbation (HCC)   Buttock wound, right, initial encounter   Hypothyroidism   CKD (chronic kidney disease) stage 3, GFR 30-59 ml/min (HCC)  Discharge Instructions: Discharge Instructions    (HEART FAILURE PATIENTS) Call MD:  Anytime you have any of the following symptoms: 1) 3 pound weight gain in 24 hours or 5 pounds in 1 week 2) shortness of breath, with or without a dry hacking cough 3) swelling in the hands, feet or stomach 4) if you have to sleep on extra pillows at night in order to breathe.   Complete by:  As directed    Call MD for:  difficulty breathing, headache or visual disturbances   Complete by:  As directed    Call MD for:  extreme fatigue   Complete by:  As directed    Call MD for:  persistant dizziness or light-headedness   Complete by:  As directed    Call MD for:  persistant nausea and vomiting   Complete by:  As directed    Call MD for:  severe uncontrolled pain   Complete by:  As directed    Diet - low sodium heart healthy   Complete by:  As directed    Increase activity slowly   Complete by:  As directed      Allergies as of 03/18/2018      Reactions   Adhesive [tape] Hives, Rash   Codeine Itching, Other (See Comments)   Lovenox [enoxaparin Sodium]    Local rash May 2019 w/ppx dosing   Vancomycin Itching, Rash   Zosyn [piperacillin Sod-tazobactam So] Itching, Rash      Medication List    STOP taking these medications   meloxicam 7.5 MG tablet Commonly known as:  MOBIC   triamcinolone 0.025 % cream Commonly known as:  KENALOG     TAKE these medications   antiseptic oral rinse Liqd 15 mLs by Mouth Rinse route as needed for dry mouth.   aspirin 81 MG tablet Take 81 mg by mouth at bedtime.   BENEFIBER Powd Take 15 g by mouth daily.   budesonide-formoterol 160-4.5 MCG/ACT inhaler Commonly known as:  SYMBICORT Inhale 2  puffs into the lungs 2 (two) times daily.   CALCIUM 600 + D PO Take 2 tablets by mouth daily.   diphenhydrAMINE 25 mg capsule Commonly known as:  BENADRYL Take 25 mg by mouth every 6 (six) hours as needed for itching.   fluconazole 150 MG tablet Commonly known as:  DIFLUCAN Take 150 mg by mouth daily. TAKING 1 EVERY FOURTH DAY   furosemide 80 MG tablet Commonly known as:  LASIX Take 80 mg by mouth See admin instructions. Take 80 mg by mouth in the morning and 80 mg at 1:30 PM daily   glucosamine-chondroitin 500-400 MG tablet Take 1 tablet by mouth 2 (two) times daily.   ipratropium-albuterol 0.5-2.5 (3) MG/3ML Soln Commonly known as:  DUONEB Take 3 mLs by nebulization every 4 (four) hours as needed (for wheezing or shortness  of breath).   JANUVIA 100 MG tablet Generic drug:  sitaGLIPtin Take 100 mg by mouth daily.   levothyroxine 50 MCG tablet Commonly known as:  SYNTHROID, LEVOTHROID Take 50 mcg by mouth daily.   metFORMIN 500 MG tablet Commonly known as:  GLUCOPHAGE Take 1,000 mg by mouth 2 (two) times daily with a meal.   montelukast 10 MG tablet Commonly known as:  SINGULAIR Take 10 mg by mouth daily.   mupirocin cream 2 % Commonly known as:  BACTROBAN Apply to the right buttock wound twice a day.   nystatin powder Commonly known as:  MYCOSTATIN/NYSTOP Apply topically 2 (two) times daily.   polyethylene glycol packet Commonly known as:  MIRALAX / GLYCOLAX Take 17 g by mouth daily with lunch.   traMADol 50 MG tablet Commonly known as:  ULTRAM Take 50 mg by mouth every 4 (four) hours as needed (for pain).      Follow-up Information    Sharilyn Sites, MD. Schedule an appointment as soon as possible for a visit in 1 week(s).   Specialty:  Family Medicine Why:  Hospital Follow Up  Contact information: [place] [address] [phone]        Lorretta Harp, MD. Schedule an appointment as soon as possible for a visit in 2  week(s).   Specialties:  Cardiology, Radiology Why:  Hospital Follow Up  Contact information: [address] [place] [address] [phone]          Allergies  Allergen Reactions  . Adhesive [Tape] Hives and Rash  . Codeine Itching and Other (See Comments)  . Lovenox [Enoxaparin Sodium]     Local rash May 2019 w/ppx dosing  . Vancomycin Itching and Rash  . Zosyn [Piperacillin Sod-Tazobactam So] Itching and Rash   Allergies as of 03/18/2018      Reactions   Adhesive [tape] Hives, Rash   Codeine Itching, Other (See Comments)   Lovenox [enoxaparin Sodium]    Local rash May 2019 w/ppx dosing   Vancomycin Itching, Rash   Zosyn [piperacillin Sod-tazobactam So] Itching, Rash      Medication List    STOP taking these medications   meloxicam 7.5 MG tablet Commonly known as:  MOBIC   triamcinolone 0.025 % cream Commonly known as:  KENALOG     TAKE these medications   antiseptic oral rinse Liqd 15 mLs by Mouth Rinse route as needed for dry mouth.   aspirin 81 MG tablet Take 81 mg by mouth at bedtime.   BENEFIBER Powd Take 15 g by mouth daily.   budesonide-formoterol 160-4.5 MCG/ACT inhaler Commonly known as:  SYMBICORT Inhale 2 puffs into the lungs 2 (two) times daily.   CALCIUM 600 + D PO Take 2 tablets by mouth daily.   diphenhydrAMINE 25 mg capsule Commonly known as:  BENADRYL Take 25 mg by mouth every 6 (six) hours as needed for itching.   fluconazole 150 MG tablet Commonly known as:  DIFLUCAN Take 150 mg by mouth daily. TAKING 1 EVERY FOURTH DAY   furosemide 80 MG tablet Commonly known as:  LASIX Take 80 mg by mouth See admin instructions. Take 80 mg by mouth in the morning and 80 mg at 1:30 PM daily   glucosamine-chondroitin 500-400 MG tablet Take 1 tablet by mouth 2 (two) times daily.   ipratropium-albuterol 0.5-2.5 (3) MG/3ML Soln Commonly known as:  DUONEB Take 3 mLs by nebulization every 4 (four) hours as needed (for wheezing  or shortness  of breath).   JANUVIA 100 MG tablet Generic drug:  sitaGLIPtin Take 100 mg by mouth daily.   levothyroxine 50 MCG tablet Commonly known as:  SYNTHROID, LEVOTHROID Take 50 mcg by mouth daily.   metFORMIN 500 MG tablet Commonly known as:  GLUCOPHAGE Take 1,000 mg by mouth 2 (two) times daily with a meal.   montelukast 10 MG tablet Commonly known as:  SINGULAIR Take 10 mg by mouth daily.   mupirocin cream 2 % Commonly known as:  BACTROBAN Apply to the right buttock wound twice a day.   nystatin powder Commonly known as:  MYCOSTATIN/NYSTOP Apply topically 2 (two) times daily.   polyethylene glycol packet Commonly known as:  MIRALAX / GLYCOLAX Take 17 g by mouth daily with lunch.   traMADol 50 MG tablet Commonly known as:  ULTRAM Take 50 mg by mouth every 4 (four) hours as needed (for pain).       Procedures/Studies: Dg Chest 2 View  Result Date: 03/17/2018 CLINICAL DATA:  Shortness of breath, increased weight, pedal edema EXAM: CHEST - 2 VIEW COMPARISON:  01/17/2018 FINDINGS: There is bilateral diffuse mild interstitial thickening. There is no focal parenchymal opacity. There is no pleural effusion or pneumothorax. There is stable cardiomegaly. The osseous structures are unremarkable. IMPRESSION: Cardiomegaly with mild pulmonary vascular congestion. Electronically Signed   By: Kathreen Devoid   On: 03/17/2018 12:22      Subjective: Pt says that she feels much better and feels stable to go home.  No chest pain and no SOB.  No fever or chills.    Discharge Exam: Vitals:   03/18/18 0630 03/18/18 0853  BP: 127/65   Pulse: 74   Resp: 20   Temp: 98.5 F (36.9 C)   SpO2: 95% 91%   Vitals:   03/17/18 2117 03/17/18 2328 03/18/18 0630 03/18/18 0853  BP: 133/64  127/65   Pulse: 78  74   Resp: 20  20   Temp: 98 F (36.7 C)  98.5 F (36.9 C)   TempSrc: Oral  Oral   SpO2: 94% 94% 95% 91%  Weight:      Height:       General: Pt is alert, awake, not in  acute distress Cardiovascular: normal S1/S2 +, no rubs, no gallops Respiratory: CTA bilaterally, no wheezing, no rhonchi Abdominal: Soft, NT, ND, bowel sounds + Extremities: no cyanosis   The results of significant diagnostics from this hospitalization (including imaging, microbiology, ancillary and laboratory) are listed below for reference.     Microbiology: Recent Results (from the past 240 hour(s))  MRSA PCR Screening     Status: Abnormal   Collection Time: 03/18/18  5:55 AM  Result Value Ref Range Status   MRSA by PCR POSITIVE (A) NEGATIVE Final    Comment:        The GeneXpert MRSA Assay (FDA approved for NASAL specimens only), is one component of a comprehensive MRSA colonization surveillance program. It is not intended to diagnose MRSA infection nor to guide or monitor treatment for MRSA infections. RESULT CALLED TO, READ BACK BY AND VERIFIED WITH: BULLINS M. AT 0102V ON [id number] BY THOMPSON S. Performed at [facility], [address], [place], [place] [address]      Labs: BNP (last 3 results) Recent Labs    01/17/18 1738 03/17/18 0943  BNP 192.6* 42.5   Basic Metabolic Panel: Recent Labs  Lab 03/17/18 0943 03/18/18 0437  NA 137 141  K 4.3 3.7  CL 92* 95*  CO2 34* 34*  GLUCOSE 168* 130*  BUN 31* 28*  CREATININE 1.30* 1.21*  CALCIUM 8.6* 8.2*   Liver Function Tests: No results for input(s): AST, ALT, ALKPHOS, BILITOT, PROT, ALBUMIN in the last 168 hours. No results for input(s): LIPASE, AMYLASE in the last 168 hours. No results for input(s): AMMONIA in the last 168 hours. CBC: Recent Labs  Lab 03/17/18 0943  WBC 14.1*  NEUTROABS 10.3*  HGB 13.0  HCT 43.1  MCV 82.1  PLT 421*   Cardiac Enzymes: Recent Labs  Lab 03/17/18 0943  TROPONINI <0.03   BNP: Invalid input(s): POCBNP CBG: Recent Labs  Lab 03/17/18 2050 03/18/18 0739  GLUCAP 167* 149*   D-Dimer No results for input(s): DDIMER in the last 72 hours. Hgb A1c No results for  input(s): HGBA1C in the last 72 hours. Lipid Profile No results for input(s): CHOL, HDL, LDLCALC, TRIG, CHOLHDL, LDLDIRECT in the last 72 hours. Thyroid function studies No results for input(s): TSH, T4TOTAL, T3FREE, THYROIDAB in the last 72 hours.  Invalid input(s): FREET3 Anemia work up No results for input(s): VITAMINB12, FOLATE, FERRITIN, TIBC, IRON, RETICCTPCT in the last 72 hours. Urinalysis    Component Value Date/Time   COLORURINE YELLOW 03/17/2018 [place] 03/17/2018 0943   LABSPEC 1.009 03/17/2018 0943   PHURINE 7.0 03/17/2018 0943   GLUCOSEU NEGATIVE 03/17/2018 0943   HGBUR NEGATIVE 03/17/2018 0943   BILIRUBINUR NEGATIVE 03/17/2018 0943   KETONESUR NEGATIVE 03/17/2018 0943   PROTEINUR NEGATIVE 03/17/2018 0943   UROBILINOGEN 0.2 06/25/2014 1823   NITRITE NEGATIVE 03/17/2018 0943   LEUKOCYTESUR NEGATIVE 03/17/2018 0943   Sepsis Labs Invalid input(s): PROCALCITONIN,  WBC,  LACTICIDVEN Microbiology Recent Results (from the past 240 hour(s))  MRSA PCR Screening     Status: Abnormal   Collection Time: 03/18/18  5:55 AM  Result Value Ref Range Status   MRSA by PCR POSITIVE (A) NEGATIVE Final    Comment:        The GeneXpert MRSA Assay (FDA approved for NASAL specimens only), is one component of a comprehensive MRSA colonization surveillance program. It is not intended to diagnose MRSA infection nor to guide or monitor treatment for MRSA infections. RESULT CALLED TO, READ BACK BY AND VERIFIED WITH: BULLINS M. AT [id number] ON [id number] BY THOMPSON S. Performed at [facility], [address], [place], [place] [address]    Time coordinating discharge:   SIGNED:  Irwin Brakeman, MD  [facility] 03/18/2018, 11:19 AM Pager [phone]  If 7PM-7AM, please contact night-coverage [email or web address] Password [email or web address]

## 2018-03-18 NOTE — Care Management Obs Status (Signed)
[place] NOTIFICATION   Patient Details  Name: [name] MRN: [id number] Date of Birth: 08/11/1941   Medicare Observation Status Notification Given:  Yes    Sherald Barge, RN 03/18/2018, 12:11 PM

## 2018-03-18 NOTE — Progress Notes (Signed)
When RT rounded on patient, O2 sats decreased to 86% on 3L, increased to 3.5L and instructed patient to take a few deep breaths.  O2 sats increased to 91%.  MD notified to make aware via text page

## 2018-03-18 NOTE — Consult Note (Addendum)
WOC consult for buttocks wound was requested prior to surgical team involvement.  Dr Arnoldo Morale performed a consult on 6/24; refer to progress notes for assessment and he has already provided bedside nurses with topical treatment orders.  Please re-consult if further assistance is needed.  Thank-you,  Julien Girt MSN, [place], [place], [place], [place]

## 2018-03-18 NOTE — Care Management CC44 (Signed)
Condition Code 44 Documentation Completed  Patient Details  Name: RAYLIE MADDISON MRN: [id number] Date of Birth: December 29, 1940   Condition Code 44 given:  Yes Patient signature on Condition Code 44 notice:  Yes Documentation of 2 MD's agreement:  Yes Code 44 added to claim:  Yes    Sherald Barge, RN 03/18/2018, 12:11 PM

## 2018-03-18 NOTE — Care Management Note (Signed)
Case Management Note  Patient Details  Name: [name] MRN: [id number] Date of Birth: 10-30-1940  Subjective/Objective:      Admitted with CHF exacerbation. Pt from home, lives with her spouse, ind with ADL's. She has an aid 8 hrs per day. She has PCP, cardiologist, insurance with drug coverage and transportation to appointments. She has all necessary DME. She was recently [place] from [facility]. Pt does not feel HH is warranted at this time. She reports compliance with weights, diet and medications.               Action/Plan: DC home today with self care.   Expected Discharge Date:  03/18/18               Expected Discharge Plan:  Home/Self Care  In-House Referral:  NA  Discharge planning Services  CM Consult  Post Acute Care Choice:  NA Choice offered to:  NA  Status of Service:  Completed, signed off  If discussed at Long Length of Stay Meetings, dates discussed:    Additional Comments:  Sherald Barge, RN 03/18/2018, 12:07 PM

## 2018-03-20 ENCOUNTER — Telehealth (HOSPITAL_COMMUNITY): Payer: Self-pay | Admitting: *Deleted

## 2018-03-20 NOTE — Telephone Encounter (Signed)
Pt aware that labs are stable.  

## 2018-03-26 ENCOUNTER — Ambulatory Visit: Payer: Medicare Other | Admitting: Podiatry

## 2018-03-26 ENCOUNTER — Encounter: Payer: Self-pay | Admitting: Podiatry

## 2018-03-26 DIAGNOSIS — B351 Tinea unguium: Secondary | ICD-10-CM

## 2018-03-26 DIAGNOSIS — M79674 Pain in right toe(s): Secondary | ICD-10-CM | POA: Diagnosis not present

## 2018-03-26 DIAGNOSIS — M79675 Pain in left toe(s): Secondary | ICD-10-CM | POA: Diagnosis not present

## 2018-03-29 NOTE — Progress Notes (Signed)
Subjective: [name] is a Type II diabetic who presents to clinic today with cc of painful, discolored, thick toenails which cause pain when wearing enclosed shoe gear. Pain interferes with routine activities. Pain is relieved with periodic professional debridement.  Objective: There were no vitals filed for this visit.  Vascular Examination: Capillary refill time <3 seconds x 10 digits Dorsalis pedis and posterior tibial pulses present b/l No digital hair x 10 digits Skin temperature warm to warm b/l  Dermatological Examination: Skin turgor WNL.  Toenails 1-5 b/l discolored, thick, dystrophic with subungual debris and pain with palpation to nailbeds due to thickness of nails.  Musculoskeletal: Muscle strength 5/5 to all LE muscle groups No gross deformities noted b/l.  Neurological: Sensation intact with 10 gram monofilament. Vibratory sensation intact.  Assessment: Painful onychomycosis toenails 1-5 b/l   Plan: 1. Toenails 1-5 b/l were debrided in length and girth. 2. Patient to continue soft, supportive shoe gear 3. Patient to report any pedal injuries to medical professional immediately. 4. Follow up 3 months.  5. Patient/POA to call should there be a concern in the interim.

## 2018-04-04 ENCOUNTER — Encounter: Payer: Self-pay | Admitting: Cardiovascular Disease

## 2018-04-04 ENCOUNTER — Ambulatory Visit: Payer: Medicare Other | Admitting: Cardiovascular Disease

## 2018-04-04 VITALS — BP 132/68 | HR 71 | Ht 65.0 in | Wt 300.0 lb

## 2018-04-04 DIAGNOSIS — I5033 Acute on chronic diastolic (congestive) heart failure: Secondary | ICD-10-CM | POA: Diagnosis not present

## 2018-04-04 DIAGNOSIS — I5032 Chronic diastolic (congestive) heart failure: Secondary | ICD-10-CM

## 2018-04-04 DIAGNOSIS — I38 Endocarditis, valve unspecified: Secondary | ICD-10-CM

## 2018-04-04 MED ORDER — METOLAZONE 2.5 MG PO TABS: 2.5000 mg | ORAL_TABLET | ORAL | 3 refills | Status: DC

## 2018-04-04 NOTE — Patient Instructions (Signed)
Medication Instructions:   START METOLAZONE 2.5 MG EVERY OTHER DAY 30 MINUTES PRIOR TO FUROSEMIDE  Labwork:  Your physician recommends that you return for lab work in: 2 WEEKS   Follow-Up:  Your physician wants you to follow-up in: [place] will receive a reminder letter in the mail two months in advance. If you don't receive a letter, please call our office to schedule the follow-up appointment.   Your physician wants you to follow-up in: [place] will receive a reminder letter in the mail two months in advance. If you don't receive a letter, please call our office to schedule the follow-up appointment.   REFERRAL TO [place]

## 2018-04-04 NOTE — Assessment & Plan Note (Signed)
[name] has had several hospitalizations for volume overload related to diastolic heart failure.  2D echo on 01/18/2018 revealed EF of 65 to 70%.  Diastolic function could not be assessed.  She is on high-dose diuretics and is aware of salt restriction.  She does have 1-2+ pitting edema still.  I am to begin her on Zaroxolyn 2.5 mg every other morning, and we will recheck a basic metabolic panel in 2 weeks.  I am going to have her see the advanced heart failure clinic for further pharmacologic therapy.

## 2018-04-04 NOTE — Assessment & Plan Note (Signed)
History of essential hypertension blood pressure measured today 132/68.  She is not on antihypertensive medications.

## 2018-04-04 NOTE — Progress Notes (Signed)
04/04/2018 [name]   1941/02/07  [id number]  Primary Physician Sharilyn Sites, MD Primary Cardiologist: Lorretta Harp MD Lupe Carney, [place]  HPI:  [name] is a 77 y.o.  severely overweight married Caucasian female with no children whose husband [name] is also patient of mine.  She is accompanied by a caregiver today.  I last saw her in the office 07/17/2017.  She was referred by Dr. Hilma Favors for cardiovascular evaluation because of lower extremity edema to rule out congestive heart failure. Risk factors include 50-pack-years of tobacco abuse having quit 7 years ago. She does have COPD. She has treated diabetes but no evidence of hypertension or hyperlipidemia. She's never had a heart attack or stroke. There is no family history. She does complain of dyspnea on exertion. She is minimally ambulatory because of dyspnea. She had noticed increased lower extremity edema recently and was evaluated by her PCP. He doubled her diuretic which resulted in marked improvement in her lower extremity edema. She is had several admissions for acute on chronic diastolic heart failure with volume overload requiring IV diuresis.  She is now on twice daily furosemide 80 mg and despite limiting her salt intake 2000 mg a day she continues to have 1-2+ pitting edema.  A 2D echocardiogram performed 01/18/2018 revealed normal LV systolic function.    Current Meds  Medication Sig  . antiseptic oral rinse (BIOTENE) LIQD 15 mLs by Mouth Rinse route as needed for dry mouth.  Marland Kitchen aspirin 81 MG tablet Take 81 mg by mouth at bedtime.   . budesonide-formoterol (SYMBICORT) 160-4.5 MCG/ACT inhaler Inhale 2 puffs into the lungs 2 (two) times daily.  . Calcium Carb-Cholecalciferol (CALCIUM 600 + D PO) Take 2 tablets by mouth daily.  . diphenhydrAMINE (BENADRYL) 25 mg capsule Take 25 mg by mouth every 6 (six) hours as needed for itching.  Marland Kitchen doxycycline (VIBRA-TABS) 100 MG tablet   . fluconazole (DIFLUCAN) 150 MG  tablet Take 150 mg by mouth daily. TAKING 1 EVERY FOURTH DAY  . furosemide (LASIX) 80 MG tablet Take 80 mg by mouth See admin instructions. Take 80 mg by mouth in the morning and 80 mg at 1:30 PM daily  . Glucosamine-Chondroit-Vit C-Mn (GLUCOSAMINE 1500 COMPLEX PO) Take 2 tablets by mouth daily.  Marland Kitchen ipratropium-albuterol (DUONEB) 0.5-2.5 (3) MG/3ML SOLN Take 3 mLs by nebulization every 4 (four) hours as needed (for wheezing or shortness of breath).   Marland Kitchen JANUVIA 100 MG tablet Take 100 mg by mouth daily.   Marland Kitchen levothyroxine (SYNTHROID, LEVOTHROID) 50 MCG tablet Take 50 mcg by mouth daily.  . metFORMIN (GLUCOPHAGE) 500 MG tablet Take 1,000 mg by mouth 2 (two) times daily with a meal.   . montelukast (SINGULAIR) 10 MG tablet Take 10 mg by mouth daily.   . mupirocin cream (BACTROBAN) 2 % Apply to the right buttock wound twice a day.  . mupirocin ointment (BACTROBAN) 2 %   . NAFTIN 2 % GEL   . nystatin (MYCOSTATIN/NYSTOP) powder Apply topically 2 (two) times daily.  . polyethylene glycol (MIRALAX / GLYCOLAX) packet Take 17 g by mouth daily with lunch.   . traMADol (ULTRAM) 50 MG tablet Take 50 mg by mouth every 4 (four) hours as needed (for pain).   . Wheat Dextrin (BENEFIBER) POWD Take 15 g by mouth daily.      Allergies  Allergen Reactions  . Adhesive [Tape] Hives and Rash  . Codeine Itching and Other (See Comments)  . Lovenox [  Enoxaparin Sodium]     Local rash May 2019 w/ppx dosing  . Vancomycin Itching and Rash  . Zosyn [Piperacillin Sod-Tazobactam So] Itching and Rash    Social History   Socioeconomic History  . Marital status: Married    Spouse name: Not on file  . Number of children: 0  . Years of education: Not on file  . Highest education level: Not on file  Occupational History  . Occupation: Retired  Scientific laboratory technician  . Financial resource strain: Not on file  . Food insecurity:    Worry: Not on file    Inability: Not on file  . Transportation needs:    Medical: Not on file     Non-medical: Not on file  Tobacco Use  . Smoking status: Former Smoker    Last attempt to quit: 09/24/2010    Years since quitting: 7.5  . Smokeless tobacco: Never Used  Substance and Sexual Activity  . Alcohol use: No    Alcohol/week: 0.0 oz  . Drug use: No  . Sexual activity: Not Currently    Birth control/protection: Surgical  Lifestyle  . Physical activity:    Days per week: Not on file    Minutes per session: Not on file  . Stress: Not on file  Relationships  . Social connections:    Talks on phone: Not on file    Gets together: Not on file    Attends religious service: Not on file    Active member of club or organization: Not on file    Attends meetings of clubs or organizations: Not on file    Relationship status: Not on file  . Intimate partner violence:    Fear of current or ex partner: Not on file    Emotionally abused: Not on file    Physically abused: Not on file    Forced sexual activity: Not on file  Other Topics Concern  . Not on file  Social History Narrative  . Not on file     Review of Systems: General: negative for chills, fever, night sweats or weight changes.  Cardiovascular: negative for chest pain, dyspnea on exertion, edema, orthopnea, palpitations, paroxysmal nocturnal dyspnea or shortness of breath Dermatological: negative for rash Respiratory: negative for cough or wheezing Urologic: negative for hematuria Abdominal: negative for nausea, vomiting, diarrhea, bright red blood per rectum, melena, or hematemesis Neurologic: negative for visual changes, syncope, or dizziness All other systems reviewed and are otherwise negative except as noted above.    Blood pressure 132/68, pulse 71, height 5\' 5"  (1.651 m), weight 300 lb (136.1 kg).  General appearance: alert and no distress Neck: no adenopathy, no carotid bruit, no JVD, supple, symmetrical, trachea midline and thyroid not enlarged, symmetric, no tenderness/mass/nodules Lungs: clear to  auscultation bilaterally Heart: regular rate and rhythm, S1, S2 normal, no murmur, click, rub or gallop Extremities: 1-2+ pitting edema bilaterally with venous stasis changes. Pulses: 2+ and symmetric Skin: Bilateral venous stasis changes. Neurologic: Alert and oriented X 3, normal strength and tone. Normal symmetric reflexes. Normal coordination and gait  EKG not performed today  ASSESSMENT AND PLAN:   Hypertension History of essential hypertension blood pressure measured today 132/68.  She is not on antihypertensive medications.  Acute on chronic diastolic CHF (congestive heart failure) ([place]) Ms. Corker has had several hospitalizations for volume overload related to diastolic heart failure.  2D echo on 01/18/2018 revealed EF of 65 to 70%.  Diastolic function could not be assessed.  She is on  high-dose diuretics and is aware of salt restriction.  She does have 1-2+ pitting edema still.  I am to begin her on Zaroxolyn 2.5 mg every other morning, and we will recheck a basic metabolic panel in 2 weeks.  I am going to have her see the advanced heart failure clinic for further pharmacologic therapy.      Lorretta Harp MD FACP,FACC,FAHA, [facility] 04/04/2018 2:38 PM

## 2018-04-07 ENCOUNTER — Encounter: Payer: Self-pay | Admitting: Cardiovascular Disease

## 2018-04-07 ENCOUNTER — Other Ambulatory Visit: Payer: Self-pay | Admitting: Cardiovascular Disease

## 2018-04-07 ENCOUNTER — Ambulatory Visit: Payer: Medicare Other | Admitting: Cardiovascular Disease

## 2018-04-07 VITALS — BP 116/68 | HR 72 | Ht 65.0 in | Wt 297.6 lb

## 2018-04-07 DIAGNOSIS — I10 Essential (primary) hypertension: Secondary | ICD-10-CM

## 2018-04-07 DIAGNOSIS — I38 Endocarditis, valve unspecified: Secondary | ICD-10-CM | POA: Diagnosis not present

## 2018-04-07 DIAGNOSIS — G4733 Obstructive sleep apnea (adult) (pediatric): Secondary | ICD-10-CM

## 2018-04-07 DIAGNOSIS — R600 Localized edema: Secondary | ICD-10-CM

## 2018-04-07 DIAGNOSIS — E039 Hypothyroidism, unspecified: Secondary | ICD-10-CM

## 2018-04-07 DIAGNOSIS — J449 Chronic obstructive pulmonary disease, unspecified: Secondary | ICD-10-CM

## 2018-04-07 DIAGNOSIS — I5032 Chronic diastolic (congestive) heart failure: Secondary | ICD-10-CM

## 2018-04-07 DIAGNOSIS — R0902 Hypoxemia: Secondary | ICD-10-CM

## 2018-04-07 NOTE — Progress Notes (Addendum)
Cardiology Office Note    Date:  04/07/2018   ID:  [name], DOB [birth year], MRN [id number]  PCP:  Sharilyn Sites, MD  Cardiologist:  Shelva Majestic, MD (sleep); Dr. Gwenlyn Found   New sleep evaluation  History of Present Illness:  [name] is a 77 y.o. female who is referred for sleep clinic evaluation.  She is followed by Dr. Gwenlyn Found  for cardiology care.  [name] has a history of COPD, diabetes mellitus, hypothyroidism,chronic respiratory failure on home O2.  She has had several admissions for acute on chronic diastolic heart failure with volume overload requiring IV diuresis.  She has a long-standing history of obstructive sleep apnea which was originally diagnosed in April 2004.  Her sleep apnea was severe with an obstructive apnea index of 18.3 and hypotony index of 20.6.  In rem sleep her apnea obstructive index was 84/h.  Her study was interpreted by Dr. clients and apparently oxygen 1 L was added due to significant desaturation.  Lowesr oxygen desaturation was 66% during rem sleep.  She apparently may have undergone a reevaluation more had hospital in 2014.  Currently she has an old S9 AutoSet CPAP unit has been set at an auto pressure of 5-20.  He has home care is her DME company.  He continues to be on vision therapy.  She sleeps in a lift chair.  She typically goes to bed between 9 and 10 PM and is up around 6.  She uses a full facemask and has 2-1/2 L of oxygen during therapy.  A download was obtained in the office from June 12 through April 03, 2018.  She is 100% compliant with average usage at 9 hours and 53 minutes.  She does not sleep this continuously but part of this is during the evening and she often uses it when she sleeps during the daytime.  Her 95th percentile pressure is 9.0 with a maximum average pressure at 11.3.  AHI is 0.9.  Her CPAP machine is greater than 5 years duration. She may qualify for a new CPAP machine.  She is now referred for initial sleep evaluation with  me.  An Epworth Sleepiness Scale score was calculated in the office today and this endorsed at 1.  She denies any daytime sleepiness but she typically takes several hour nap in the daytime.  There is no bruxism.  She denies hypnagogic hallucinations.  She denies sleep paralysis.  She presents for evaluation  Past Medical History:  Diagnosis Date  . Anemia   . Arthritis   . Bronchitis 05/2014  . Cellulitis of lower extremity 06/2014   bilateral  . CHF (congestive heart failure) ([place])   . COPD (chronic obstructive pulmonary disease) ([place])   . Diabetes mellitus   . Diverticulitis   . Gallstone   . Hypertension   . MRSA (methicillin resistant Staphylococcus aureus)    has now "tested negative"- no open wounds as of 02-10-15  . Obesity   . On home O2   . Shortness of breath    with exertion   . Sleep apnea    cpap at 10   . Sleep apnea   . Thyroid disease   . Tubular adenoma of colon     Past Surgical History:  Procedure Laterality Date  . ABDOMINAL HYSTERECTOMY  1979  . ABDOMINAL HYSTERECTOMY  1978  . APPENDECTOMY    . APPLICATION OF WOUND VAC  wound vac  . APPLICATION OF WOUND VAC  06/26/2012  Procedure: APPLICATION OF WOUND VAC;  Surgeon: Gayland Curry, MD,FACS;  Location: [facility];  Service: General;;  . BACK SURGERY    . COLON RESECTION  10/09/2011   Procedure: COLON RESECTION;  Surgeon: Gayland Curry, MD;  Location: [facility];  Service: General;  Laterality: N/A;  Colon Resection with colostomy  . COLON RESECTION  06/26/2012   Procedure: COLON RESECTION;  Surgeon: Gayland Curry, MD,FACS;  Location: [facility];  Service: General;  Laterality: N/A;  . ESOPHAGOGASTRODUODENOSCOPY (EGD) WITH PROPOFOL N/A 02/17/2015   Procedure: ESOPHAGOGASTRODUODENOSCOPY (EGD) WITH PROPOFOL;  Surgeon: Jerene Bears, MD;  Location: [facility];  Service: Gastroenterology;  Laterality: N/A;  . jackson pratt    . PROCTOSCOPY  06/26/2012   Procedure: PROCTOSCOPY;  Surgeon: Gayland Curry, MD,FACS;  Location: [facility];  Service: General;  Laterality: N/A;  Rigid Proctoscopy    Current Medications: Outpatient Medications Prior to Visit  Medication Sig Dispense Refill  . antiseptic oral rinse (BIOTENE) LIQD 15 mLs by Mouth Rinse route as needed for dry mouth.    Marland Kitchen aspirin 81 MG tablet Take 81 mg by mouth at bedtime.     . budesonide-formoterol (SYMBICORT) 160-4.5 MCG/ACT inhaler Inhale 2 puffs into the lungs 2 (two) times daily.    . Calcium Carb-Cholecalciferol (CALCIUM 600 + D PO) Take 2 tablets by mouth daily.    . diphenhydrAMINE (BENADRYL) 25 mg capsule Take 25 mg by mouth every 6 (six) hours as needed for itching.    Marland Kitchen doxycycline (VIBRA-TABS) 100 MG tablet     . fluconazole (DIFLUCAN) 150 MG tablet Take 150 mg by mouth daily. TAKING 1 EVERY FOURTH DAY    . furosemide (LASIX) 80 MG tablet Take 80 mg by mouth See admin instructions. Take 80 mg by mouth in the morning and 80 mg at 1:30 PM daily    . Glucosamine-Chondroit-Vit C-Mn (GLUCOSAMINE 1500 COMPLEX PO) Take 2 tablets by mouth daily.    Marland Kitchen ipratropium-albuterol (DUONEB) 0.5-2.5 (3) MG/3ML SOLN Take 3 mLs by nebulization every 4 (four) hours as needed (for wheezing or shortness of breath).     Marland Kitchen JANUVIA 100 MG tablet Take 100 mg by mouth daily.     Marland Kitchen levothyroxine (SYNTHROID, LEVOTHROID) 50 MCG tablet Take 50 mcg by mouth daily.    . metFORMIN (GLUCOPHAGE) 500 MG tablet Take 1,000 mg by mouth 2 (two) times daily with a meal.     . metolazone (ZAROXOLYN) 2.5 MG tablet Take 1 tablet (2.5 mg total) by mouth every other day. Take 30 mins prior to furosemide 45 tablet 3  . montelukast (SINGULAIR) 10 MG tablet Take 10 mg by mouth daily.     . mupirocin cream (BACTROBAN) 2 % Apply to the right buttock wound twice a day. 30 g 0  . mupirocin ointment (BACTROBAN) 2 %     . NAFTIN 2 % GEL     . nystatin (MYCOSTATIN/NYSTOP) powder Apply topically 2 (two) times daily. 30 g 0  . polyethylene glycol (MIRALAX / GLYCOLAX) packet Take 17 g by mouth daily with  lunch.     . traMADol (ULTRAM) 50 MG tablet Take 50 mg by mouth every 4 (four) hours as needed (for pain).     . Wheat Dextrin (BENEFIBER) POWD Take 15 g by mouth daily.      Facility-Administered Medications Prior to Visit  Medication Dose Route Frequency Provider Last Rate Last Dose  . 0.9 %  sodium chloride infusion   Intravenous Continuous Penland,  Kelby Fam, MD   Stopped at 07/23/16 1512     Allergies:   Adhesive [tape]; Codeine; Lovenox [enoxaparin sodium]; Vancomycin; and Zosyn [piperacillin sod-tazobactam so]   Social History   Socioeconomic History  . Marital status: Married    Spouse name: Not on file  . Number of children: 0  . Years of education: Not on file  . Highest education level: Not on file  Occupational History  . Occupation: Retired  Scientific laboratory technician  . Financial resource strain: Not on file  . Food insecurity:    Worry: Not on file    Inability: Not on file  . Transportation needs:    Medical: Not on file    Non-medical: Not on file  Tobacco Use  . Smoking status: Former Smoker    Last attempt to quit: 09/24/2010    Years since quitting: 7.5  . Smokeless tobacco: Never Used  Substance and Sexual Activity  . Alcohol use: No    Alcohol/week: 0.0 oz  . Drug use: No  . Sexual activity: Not Currently    Birth control/protection: Surgical  Lifestyle  . Physical activity:    Days per week: Not on file    Minutes per session: Not on file  . Stress: Not on file  Relationships  . Social connections:    Talks on phone: Not on file    Gets together: Not on file    Attends religious service: Not on file    Active member of club or organization: Not on file    Attends meetings of clubs or organizations: Not on file    Relationship status: Not on file  Other Topics Concern  . Not on file  Social History Narrative  . Not on file    Additional social history is notable that she is married for 59 years.  She does not have children.  Family History:  The  patient's family history includes Diabetes in her brother, father, mother, and sister; Heart disease in her brother and father; Hypertension in her father and mother.  Her father died at age 6 and had congestive heart failure.  Her mother died at age 31 and had a hip fracture and pulmonary embolism.  She has 1 brother who is deceased and had diabetes and heart problems.  A sister is deceased who had diabetes mellitus.  ROS General: Negative; No fevers, chills, or night sweats;  HEENT: Negative; No changes in vision or hearing, sinus congestion, difficulty swallowing Pulmonary: OPD on supplemental oxygen Cardiovascular: Negative; No chest pain, presyncope, syncope, palpitations GI: Negative; No nausea, vomiting, diarrhea, or abdominal pain GU: Negative; No dysuria, hematuria, or difficulty voiding Musculoskeletal: Negative; no myalgias, joint pain, or weakness Hematologic/Oncology: Negative; no easy bruising, bleeding Endocrine: Negative; no heat/cold intolerance; no diabetes Neuro: Negative; no changes in balance, headaches Skin: Negative; No rashes or skin lesions Psychiatric: Negative; No behavioral problems, depression Sleep: OSA on CPAP therapy is 2004; No residual snoring, daytime sleepiness, hypersomnolence, bruxism, restless legs, hypnogognic hallucinations, no cataplexy Other comprehensive 14 point system review is negative.   PHYSICAL EXAM:   VS:  BP 116/68   Pulse 72   Ht '5\' 5"'  (1.651 m)   Wt 297 lb 9.6 oz (135 kg)   SpO2 92%   BMI 49.52 kg/m     Repeat blood pressure by me was 108/66.  Wt Readings from Last 3 Encounters:  04/07/18 297 lb 9.6 oz (135 kg)  04/04/18 300 lb (136.1 kg)  03/17/18 (!) 305 lb (  138.3 kg)    General: Alert, oriented, no distress.  Skin: normal turgor, no rashes, warm and dry HEENT: Normocephalic, atraumatic. Pupils equal round and reactive to light; sclera anicteric; extraocular muscles intact; Fundi no hemorrhages or exudates.  Discs flat    Nose without nasal septal hypertrophy Mouth/Parynx benign; Mallinpatti scale 3 Neck: No JVD, no carotid bruits; normal carotid upstroke Lungs: clear to ausculatation and percussion; no wheezing or rales Chest wall: without tenderness to palpitation Heart: PMI not displaced, RRR, s1 s2 normal, 1/6 systolic murmur, no diastolic murmur, no rubs, gallops, thrills, or heaves Abdomen: soft, nontender; no hepatosplenomehaly, BS+; abdominal aorta nontender and not dilated by palpation. Back: no CVA tenderness Pulses 2+ Musculoskeletal: full range of motion, normal strength, no joint deformities Extremities: 1+pitting edema;  no clubbing,cyanosis, Homan's sign negative  Neurologic: grossly nonfocal; Cranial nerves grossly wnl Psychologic: Normal mood and affect   Studies/Labs Reviewed:   EKG:  EKG is not ordered today.  Personally reviewed her ECG of March 17, 2018 which shows normal sinus rhythm at 74 bpm, and isolated PAC.  First-degree AV block.  No significant ST-T changes.  Recent Labs: BMP Latest Ref Rng & Units 03/18/2018 03/17/2018 03/07/2018  Glucose 70 - 99 mg/dL 130(H) 168(H) 89  BUN 8 - 23 mg/dL 28(H) 31(H) 27(H)  Creatinine 0.44 - 1.00 mg/dL 1.21(H) 1.30(H) 1.13(H)  Sodium 135 - 145 mmol/L 141 137 134(L)  Potassium 3.5 - 5.1 mmol/L 3.7 4.3 4.0  Chloride 98 - 111 mmol/L 95(L) 92(L) 90(L)  CO2 22 - 32 mmol/L 34(H) 34(H) 33(H)  Calcium 8.9 - 10.3 mg/dL 8.2(L) 8.6(L) 9.0     Hepatic Function Latest Ref Rng & Units 03/07/2018 01/21/2015 06/25/2014  Total Protein 6.5 - 8.1 g/dL 7.1 7.2 6.9  Albumin 3.5 - 5.0 g/dL 3.4(L) 3.5 3.1(L)  AST 15 - 41 U/L 14(L) 16 22  ALT 14 - 54 U/L 13(L) 18 57(H)  Alk Phosphatase 38 - 126 U/L 72 79 94  Total Bilirubin 0.3 - 1.2 mg/dL 0.8 0.8 0.5    CBC Latest Ref Rng & Units 03/17/2018 03/07/2018 01/22/2018  WBC 4.0 - 10.5 K/uL 14.1(H) 13.7(H) 14.9(H)  Hemoglobin 12.0 - 15.0 g/dL 13.0 13.6 14.6  Hematocrit 36.0 - 46.0 % 43.1 45.7 47.6(H)  Platelets 150 -  400 K/uL 421(H) 356 332   Lab Results  Component Value Date   MCV 82.1 03/17/2018   MCV 81.6 03/07/2018   MCV 83.8 01/22/2018   Lab Results  Component Value Date   TSH 3.47 10/12/2015   Lab Results  Component Value Date   HGBA1C 6.9 (H) 06/07/2014     BNP    Component Value Date/Time   BNP 94.0 03/17/2018 0943    ProBNP    Component Value Date/Time   PROBNP 64.6 06/05/2014 1823     Lipid Panel  No results found for: CHOL, TRIG, HDL, CHOLHDL, VLDL, LDLCALC, LDLDIRECT   RADIOLOGY: Dg Chest 2 View  Result Date: 03/17/2018 CLINICAL DATA:  Shortness of breath, increased weight, pedal edema EXAM: CHEST - 2 VIEW COMPARISON:  01/17/2018 FINDINGS: There is bilateral diffuse mild interstitial thickening. There is no focal parenchymal opacity. There is no pleural effusion or pneumothorax. There is stable cardiomegaly. The osseous structures are unremarkable. IMPRESSION: Cardiomegaly with mild pulmonary vascular congestion. Electronically Signed   By: Kathreen Devoid   On: 03/17/2018 12:22     Additional studies/ records that were reviewed today include:  Reviewed the patient's scored report from her sleep study  in April 2004 and also reviewed records from [facility] in 2014.  Recent hospital record and office visit with Dr. Alvester Chou were reviewed.  ASSESSMENT:    No diagnosis found.   PLAN:  Ms. Herma "[name] "Boehlke is a 77 year old patient who is followed by Dr. Alvester Chou for cardiology care.  She has a history of morbid obesity, COPD, respiratory failure on supplemental oxygen, diabetes mellitus, hypothyroidism, as well as issues with chronic diastolic heart failure and significant lower extremity edema.  She has significant obstructive sleep apnea diagnosed in 2004 and has been on CPAP therapy ever since.  Her most recent CPAP machine is an S9 AutoSet ResMed unit and she has been on auto pressure ranging from 5-20 with supplemental 2-1/2 L of oxygen.  I reviewed her most recent  download.  She sleeps in a lift chair and sleeps part of the night part of the day.  She is averaging 9 hours and 53 minutes of CPAP use on a daily basis.  Her AHI is excellent and her average 95th percentile pressure was 9 with a maximum average pressure of 11.3.  She is sgnificantly benefiting from continued use of her CPAP therapy.  She qualifies for a new machine based on her machine age.  She is unaware of any significant malfunction.  If she qualifies for a new machine based on insurance I will prescribe a ResMed air sense 10 AutoSet with parameters ranging from 8 to 20 cm water pressure.  If she is granted a new machine, I will see her within 90 days for sleep evaluation.  Otherwise if she continues her current therapy, I will see her in 1 year for reevaluation.  When last seen by Dr. Gwenlyn Found she was recently given a prescription for metolazone which has resulted in improvement in her leg edema to her blood pressure today is stable on current therapy.  She is diabetic on Januvia, and metformin.  With her history of diastolic heart failure issues, if additional diabetic medication is indicated she may benefit from consideration of Jardiance.  I will see her for follow-up sleep evaluation.   Medication Adjustments/Labs and Tests Ordered: Current medicines are reviewed at length with the patient today.  Concerns regarding medicines are outlined above.  Medication changes, Labs and Tests ordered today are listed in the Patient Instructions below. Patient Instructions  Follow-Up: 3-4 months with Dr. Claiborne Billings (sleep clinic)-if you get a new machine If you do not get a new machine-follow up in 1 year  Any Other Special Instructions Will Be Listed Below (If Applicable).  We will send order to [facility] for a new CPAP machine   If you need a refill on your cardiac medications before your next appointment, please call your pharmacy.      Signed, Shelva Majestic, MD  04/07/2018 12:35 PM    [facility] [address], [place], [place], [place]  [address] Phone: [phone]

## 2018-04-07 NOTE — Patient Instructions (Signed)
Follow-Up: 3-4 months with Dr. Claiborne Billings (sleep clinic)-if you get a new machine If you do not get a new machine-follow up in 1 year  Any Other Special Instructions Will Be Listed Below (If Applicable).  We will send order to [facility] for a new CPAP machine   If you need a refill on your cardiac medications before your next appointment, please call your pharmacy.

## 2018-04-13 ENCOUNTER — Encounter: Payer: Self-pay | Admitting: Cardiovascular Disease

## 2018-05-05 ENCOUNTER — Other Ambulatory Visit (HOSPITAL_COMMUNITY): Payer: Self-pay

## 2018-05-05 ENCOUNTER — Inpatient Hospital Stay (HOSPITAL_COMMUNITY): Payer: Medicare Other | Attending: Internal Medicine

## 2018-05-05 DIAGNOSIS — D500 Iron deficiency anemia secondary to blood loss (chronic): Secondary | ICD-10-CM

## 2018-05-05 LAB — COMPREHENSIVE METABOLIC PANEL
ALK PHOS: 72 U/L (ref 38–126)
ALT: 13 U/L (ref 0–44)
AST: 17 U/L (ref 15–41)
Albumin: 3.3 g/dL — ABNORMAL LOW (ref 3.5–5.0)
Anion gap: 12 (ref 5–15)
BUN: 44 mg/dL — ABNORMAL HIGH (ref 8–23)
CALCIUM: 9.7 mg/dL (ref 8.9–10.3)
CO2: 36 mmol/L — AB (ref 22–32)
Chloride: 84 mmol/L — ABNORMAL LOW (ref 98–111)
Creatinine, Ser: 1.45 mg/dL — ABNORMAL HIGH (ref 0.44–1.00)
GFR, EST AFRICAN AMERICAN: 39 mL/min — AB (ref >60)
GFR, EST NON AFRICAN AMERICAN: 34 mL/min — AB (ref >60)
Glucose, Bld: 84 mg/dL (ref 70–99)
Potassium: 3.3 mmol/L — ABNORMAL LOW (ref 3.5–5.1)
SODIUM: 132 mmol/L — AB (ref 135–145)
Total Bilirubin: 0.6 mg/dL (ref 0.3–1.2)
Total Protein: 7 g/dL (ref 6.5–8.1)

## 2018-05-05 LAB — CBC WITH DIFFERENTIAL/PLATELET
BASOS PCT: 0 %
Basophils Absolute: 0 10*3/uL (ref 0.0–0.1)
Eosinophils Absolute: 0.4 10*3/uL (ref 0.0–0.7)
Eosinophils Relative: 3 %
HEMATOCRIT: 41.1 % (ref 36.0–46.0)
Hemoglobin: 12.8 g/dL (ref 12.0–15.0)
Lymphocytes Relative: 16 %
Lymphs Abs: 2.1 10*3/uL (ref 0.7–4.0)
MCH: 24.3 pg — ABNORMAL LOW (ref 26.0–34.0)
MCHC: 31.1 g/dL (ref 30.0–36.0)
MCV: 78 fL (ref 78.0–100.0)
MONOS PCT: 7 %
Monocytes Absolute: 0.9 10*3/uL (ref 0.1–1.0)
NEUTROS ABS: 9.6 10*3/uL — AB (ref 1.7–7.7)
NEUTROS PCT: 74 %
Platelets: 382 10*3/uL (ref 150–400)
RBC: 5.27 MIL/uL — AB (ref 3.87–5.11)
RDW: 16.5 % — ABNORMAL HIGH (ref 11.5–15.5)
WBC: 13 10*3/uL — AB (ref 4.0–10.5)

## 2018-05-05 LAB — FERRITIN: Ferritin: 29 ng/mL (ref 11–307)

## 2018-05-05 LAB — LACTATE DEHYDROGENASE: LDH: 224 U/L — ABNORMAL HIGH (ref 98–192)

## 2018-05-15 ENCOUNTER — Ambulatory Visit (HOSPITAL_COMMUNITY)
Admission: RE | Admit: 2018-05-15 | Discharge: 2018-05-15 | Disposition: A | Discharge status: Home / Self Care | Payer: Medicare Other | Source: Ambulatory Visit | Attending: Internal Medicine | Admitting: Internal Medicine

## 2018-05-15 VITALS — BP 128/70 | HR 70 | Wt 289.0 lb

## 2018-05-15 DIAGNOSIS — Z6841 Body Mass Index (BMI) 40.0 and over, adult: Secondary | ICD-10-CM | POA: Insufficient documentation

## 2018-05-15 DIAGNOSIS — Z885 Allergy status to narcotic agent status: Secondary | ICD-10-CM | POA: Insufficient documentation

## 2018-05-15 DIAGNOSIS — Z79890 Hormone replacement therapy (postmenopausal): Secondary | ICD-10-CM | POA: Insufficient documentation

## 2018-05-15 DIAGNOSIS — Z888 Allergy status to other drugs, medicaments and biological substances status: Secondary | ICD-10-CM | POA: Insufficient documentation

## 2018-05-15 DIAGNOSIS — Z7982 Long term (current) use of aspirin: Secondary | ICD-10-CM | POA: Diagnosis not present

## 2018-05-15 DIAGNOSIS — I5032 Chronic diastolic (congestive) heart failure: Secondary | ICD-10-CM | POA: Diagnosis not present

## 2018-05-15 DIAGNOSIS — Z79899 Other long term (current) drug therapy: Secondary | ICD-10-CM | POA: Insufficient documentation

## 2018-05-15 DIAGNOSIS — Z86010 Personal history of colonic polyps: Secondary | ICD-10-CM | POA: Diagnosis not present

## 2018-05-15 DIAGNOSIS — Z87891 Personal history of nicotine dependence: Secondary | ICD-10-CM | POA: Diagnosis not present

## 2018-05-15 DIAGNOSIS — J9611 Chronic respiratory failure with hypoxia: Secondary | ICD-10-CM | POA: Insufficient documentation

## 2018-05-15 DIAGNOSIS — Z7951 Long term (current) use of inhaled steroids: Secondary | ICD-10-CM | POA: Diagnosis not present

## 2018-05-15 DIAGNOSIS — I50810 Right heart failure, unspecified: Secondary | ICD-10-CM | POA: Diagnosis not present

## 2018-05-15 DIAGNOSIS — Z7984 Long term (current) use of oral hypoglycemic drugs: Secondary | ICD-10-CM | POA: Insufficient documentation

## 2018-05-15 DIAGNOSIS — E119 Type 2 diabetes mellitus without complications: Secondary | ICD-10-CM | POA: Diagnosis not present

## 2018-05-15 DIAGNOSIS — Z881 Allergy status to other antibiotic agents status: Secondary | ICD-10-CM | POA: Diagnosis not present

## 2018-05-15 DIAGNOSIS — J449 Chronic obstructive pulmonary disease, unspecified: Secondary | ICD-10-CM | POA: Insufficient documentation

## 2018-05-15 DIAGNOSIS — E662 Morbid (severe) obesity with alveolar hypoventilation: Secondary | ICD-10-CM | POA: Diagnosis not present

## 2018-05-15 DIAGNOSIS — M1990 Unspecified osteoarthritis, unspecified site: Secondary | ICD-10-CM | POA: Diagnosis not present

## 2018-05-15 DIAGNOSIS — Z833 Family history of diabetes mellitus: Secondary | ICD-10-CM | POA: Diagnosis not present

## 2018-05-15 DIAGNOSIS — E079 Disorder of thyroid, unspecified: Secondary | ICD-10-CM | POA: Diagnosis not present

## 2018-05-15 DIAGNOSIS — Z8249 Family history of ischemic heart disease and other diseases of the circulatory system: Secondary | ICD-10-CM | POA: Diagnosis not present

## 2018-05-15 DIAGNOSIS — I110 Hypertensive heart disease with heart failure: Secondary | ICD-10-CM | POA: Insufficient documentation

## 2018-05-15 DIAGNOSIS — Z9981 Dependence on supplemental oxygen: Secondary | ICD-10-CM | POA: Insufficient documentation

## 2018-05-15 DIAGNOSIS — I38 Endocarditis, valve unspecified: Secondary | ICD-10-CM

## 2018-05-15 LAB — BASIC METABOLIC PANEL
ANION GAP: 14 (ref 5–15)
BUN: 45 mg/dL — ABNORMAL HIGH (ref 8–23)
CHLORIDE: 84 mmol/L — AB (ref 98–111)
CO2: 34 mmol/L — AB (ref 22–32)
Calcium: 9.7 mg/dL (ref 8.9–10.3)
Creatinine, Ser: 1.42 mg/dL — ABNORMAL HIGH (ref 0.44–1.00)
GFR calc Af Amer: 40 mL/min — ABNORMAL LOW (ref >60)
GFR calc non Af Amer: 34 mL/min — ABNORMAL LOW (ref >60)
Glucose, Bld: 109 mg/dL — ABNORMAL HIGH (ref 70–99)
POTASSIUM: 3.2 mmol/L — AB (ref 3.5–5.1)
Sodium: 132 mmol/L — ABNORMAL LOW (ref 135–145)

## 2018-05-15 MED ORDER — METOLAZONE 2.5 MG PO TABS: 2.5000 mg | ORAL_TABLET | ORAL | 3 refills | Status: DC | PRN

## 2018-05-15 MED ORDER — SPIRONOLACTONE 25 MG PO TABS: 12.5000 mg | ORAL_TABLET | Freq: Every day | ORAL | 3 refills | Status: DC

## 2018-05-15 MED ORDER — TORSEMIDE 20 MG PO TABS: 60.0000 mg | ORAL_TABLET | Freq: Two times a day (BID) | ORAL | 3 refills | Status: DC

## 2018-05-15 MED ORDER — POTASSIUM CHLORIDE CRYS ER 20 MEQ PO TBCR: 20.0000 meq | EXTENDED_RELEASE_TABLET | Freq: Every day | ORAL | 3 refills | Status: DC

## 2018-05-15 NOTE — Patient Instructions (Addendum)
Stop Furosemide   Start Torsemide 60 mg (3 tabs) Twice daily   Start Spironolactone 12.5 mg (1/2 tab) daily  Start Potassium 20 meq (1 tab) daily, BUT TAKE 2 TABS TODAY ONLY  Change Metolazone to take ONLY AS NEEDED once a week  Your physician recommends that you schedule a follow-up appointment in: 2 weeks

## 2018-05-15 NOTE — Progress Notes (Signed)
Advanced Heart Failure Consult Note   Referring Physician: Dr. Gwenlyn Found PCP: Sharilyn Sites, MD PCP-Cardiologist: Quay Burow, MD   HPI:  [name] is a 77 y.o. female with super morbid obesity, chronic diastolic CHF, DM2, COPD with 50 pack year history, OSA/OHS on CPAP, and HTN who was referred to the HF clinic by Dr. Gwenlyn Found for further evaluation and treatment of her diastolic HF.   She was last seen by Dr. Gwenlyn Found 04/04/18. Noted to have 1-2+ edema despite high-dose diuretics. Metolazone added for 2.5 mg every other day and referred to HF clinic for further evaluation and recommendations.   She is here today with her husband. Says she is feeling OK today. She was very SOB weighing but states that is her normal. She cannot stand for prolonged periods (more than a minute or two). She limits her sodium to [phone] mg. She has lost about 11 lbs since starting on metolazone. Edema improved but not resolved. Sleeps in a lift chair at home with chronic orthopnea. Unable to get out much, Denies lightheadedness or dizziness. She is struggling with keeping her neck upright currently, has been seeing chiropractor but not ortho yet. Stopped smoking years ago. Keeps ice water beside her through the day, but PT and [place] aide attest that she doesn't drink half of it through the day. Wears CPAP every night. Lives at home with her husband and has a Psychologist, clinical. Denies CP, palpitations, syncope or presyncope.    Echo 01/18/18 LVEF 65-70%, Mod RV reduction, + RV strain with septal flattening Trivial TR. Unable to assess RVSP accurately. Personally reviewed   Review of Systems: [y] = yes, [ ]  = no   General: Weight gain [ ] ; Weight loss [ y]; Anorexia [ ] ; Fatigue [y]; Fever [ ] ; Chills [ ] ; Weakness [y]  Cardiac: Chest pain/pressure [ ] ; Resting SOB [y]; Exertional SOB [y]; Orthopnea [ ] ; Pedal Edema [y]; Palpitations [ ] ; Syncope [ ] ; Presyncope [ ] ; Paroxysmal nocturnal dyspnea[ ]   Pulmonary: Cough [ ] ; Wheezing[  ]; Hemoptysis[ ] ; Sputum [ ] ; Snoring [ ]   GI: Vomiting[ ] ; Dysphagia[ ] ; Melena[ ] ; Hematochezia [ ] ; Heartburn[ ] ; Abdominal pain [ ] ; Constipation [ ] ; Diarrhea [ ] ; BRBPR [ ]   GU: Hematuria[ ] ; Dysuria [ ] ; Nocturia[ ]   Vascular: Pain in legs with walking [ ] ; Pain in feet with lying flat [ ] ; Non-healing sores [ ] ; Stroke [ ] ; TIA [ ] ; Slurred speech [ ] ;  Neuro: Headaches[ ] ; Vertigo[ ] ; Seizures[ ] ; Paresthesias[ ] ;Blurred vision [ ] ; Diplopia [ ] ; Vision changes [ ]   Ortho/Skin: Arthritis [y]; Joint pain [y]; Muscle pain [ ] ; Joint swelling [ ] ; Back Pain [ ] ; Rash [ ]   Psych: Depression[ ] ; Anxiety[ ]   Heme: Bleeding problems [ ] ; Clotting disorders [ ] ; Anemia [ ]   Endocrine: Diabetes [ y]; Thyroid dysfunction[y ]  Past Medical History:  Diagnosis Date  . Anemia   . Arthritis   . Bronchitis 05/2014  . Cellulitis of lower extremity 06/2014   bilateral  . CHF (congestive heart failure) ([place])   . COPD (chronic obstructive pulmonary disease) ([place])   . Diabetes mellitus   . Diverticulitis   . Gallstone   . Hypertension   . MRSA (methicillin resistant Staphylococcus aureus)    has now "tested negative"- no open wounds as of 02-10-15  . Obesity   . On home O2   . Shortness of breath    with exertion   . Sleep  apnea    cpap at 10   . Sleep apnea   . Thyroid disease   . Tubular adenoma of colon     Current Outpatient Medications  Medication Sig Dispense Refill  . antiseptic oral rinse (BIOTENE) LIQD 15 mLs by Mouth Rinse route as needed for dry mouth.    Marland Kitchen aspirin 81 MG tablet Take 81 mg by mouth at bedtime.     . budesonide-formoterol (SYMBICORT) 160-4.5 MCG/ACT inhaler Inhale 2 puffs into the lungs 2 (two) times daily.    . Calcium Carb-Cholecalciferol (CALCIUM 600 + D PO) Take 2 tablets by mouth daily.    . diphenhydrAMINE (BENADRYL) 25 mg capsule Take 25 mg by mouth every 6 (six) hours as needed for itching.    Marland Kitchen doxycycline (VIBRA-TABS) 100 MG tablet     . fluconazole  (DIFLUCAN) 150 MG tablet Take 150 mg by mouth daily. TAKING 1 EVERY FOURTH DAY    . furosemide (LASIX) 80 MG tablet Take 80 mg by mouth See admin instructions. Take 80 mg by mouth in the morning and 80 mg at 1:30 PM daily    . Glucosamine-Chondroit-Vit C-Mn (GLUCOSAMINE 1500 COMPLEX PO) Take 2 tablets by mouth daily.    Marland Kitchen ipratropium-albuterol (DUONEB) 0.5-2.5 (3) MG/3ML SOLN Take 3 mLs by nebulization every 4 (four) hours as needed (for wheezing or shortness of breath).     Marland Kitchen JANUVIA 100 MG tablet Take 100 mg by mouth daily.     Marland Kitchen levothyroxine (SYNTHROID, LEVOTHROID) 50 MCG tablet Take 50 mcg by mouth daily.    . metFORMIN (GLUCOPHAGE) 500 MG tablet Take 1,000 mg by mouth 2 (two) times daily with a meal.     . metolazone (ZAROXOLYN) 2.5 MG tablet Take 1 tablet (2.5 mg total) by mouth every other day. Take 30 mins prior to furosemide 45 tablet 3  . montelukast (SINGULAIR) 10 MG tablet Take 10 mg by mouth daily.     . mupirocin cream (BACTROBAN) 2 % Apply to the right buttock wound twice a day. 30 g 0  . NAFTIN 2 % GEL     . nystatin (MYCOSTATIN/NYSTOP) powder Apply topically 2 (two) times daily. 30 g 0  . polyethylene glycol (MIRALAX / GLYCOLAX) packet Take 17 g by mouth daily with lunch.     . traMADol (ULTRAM) 50 MG tablet Take 50 mg by mouth every 4 (four) hours as needed (for pain).     . Wheat Dextrin (BENEFIBER) POWD Take 15 g by mouth daily.      No current facility-administered medications for this encounter.    Facility-Administered Medications Ordered in Other Encounters  Medication Dose Route Frequency Provider Last Rate Last Dose  . 0.9 %  sodium chloride infusion   Intravenous Continuous Penland, Kelby Fam, MD   Stopped at 07/23/16 1512   Allergies  Allergen Reactions  . Adhesive [Tape] Hives and Rash  . Codeine Itching and Other (See Comments)  . Lovenox [Enoxaparin Sodium]     Local rash May 2019 w/ppx dosing  . Vancomycin Itching and Rash  . Zosyn [Piperacillin  Sod-Tazobactam So] Itching and Rash    Social History   Socioeconomic History  . Marital status: Married    Spouse name: Not on file  . Number of children: 0  . Years of education: Not on file  . Highest education level: Not on file  Occupational History  . Occupation: Retired  Scientific laboratory technician  . Financial resource strain: Not on file  . Food insecurity:  Worry: Not on file    Inability: Not on file  . Transportation needs:    Medical: Not on file    Non-medical: Not on file  Tobacco Use  . Smoking status: Former Smoker    Last attempt to quit: 09/24/2010    Years since quitting: 7.6  . Smokeless tobacco: Never Used  Substance and Sexual Activity  . Alcohol use: No    Alcohol/week: 0.0 standard drinks  . Drug use: No  . Sexual activity: Not Currently    Birth control/protection: Surgical  Lifestyle  . Physical activity:    Days per week: Not on file    Minutes per session: Not on file  . Stress: Not on file  Relationships  . Social connections:    Talks on phone: Not on file    Gets together: Not on file    Attends religious service: Not on file    Active member of club or organization: Not on file    Attends meetings of clubs or organizations: Not on file    Relationship status: Not on file  . Intimate partner violence:    Fear of current or ex partner: Not on file    Emotionally abused: Not on file    Physically abused: Not on file    Forced sexual activity: Not on file  Other Topics Concern  . Not on file  Social History Narrative  . Not on file   Family History  Problem Relation Age of Onset  . Heart disease Father   . Diabetes Father   . Hypertension Father   . Diabetes Mother   . Hypertension Mother   . Heart disease Brother   . Diabetes Brother   . Diabetes Sister   . Colon cancer Neg Hx   . Colon polyps Neg Hx   . Esophageal cancer Neg Hx   . Kidney disease Neg Hx   . Gallbladder disease Neg Hx    Vitals:   05/15/18 1029  BP: 128/70    Pulse: 70  SpO2: 90%  Weight: 131.1 kg (289 lb)   Wt Readings from Last 3 Encounters:  05/15/18 131.1 kg (289 lb)  04/07/18 135 kg (297 lb 9.6 oz)  04/04/18 136.1 kg (300 lb)    PHYSICAL EXAM: General:  Chronically ill appearing. In [place]. Very SOB weighing on arrival to clinic.  HEENT: normal Neck: supple. no JVD. Carotids 2+ bilat; no bruits. No lymphadenopathy or thyromegaly appreciated. Cor: PMI nondisplaced. Regular rate & rhythm. No rubs, gallops or murmurs. Lungs: clear no wheeze Abdomen: Marked central obesity, soft, nontender, nondistended. Unable to assess for hepatosplenomegaly. No bruits or masses. Good bowel sounds. Extremities: no cyanosis. + clubbing. Chronic venous stasis changes BLE with 1+ chronic, woody edema. + DP pulses. Neuro: alert & oriented x 3, cranial nerves grossly intact. moves all 4 extremities w/o difficulty. Affect pleasant   ASSESSMENT & PLAN:  1. Chronic diastolic CHF - Echo 3/32/95 LVEF 65-70%, Mod RV reduction, Trivial TR - Volume status difficult due to body habitus, but appears relatively stable.  - She is on lasix 80 mg BID. Suspect she would benefit from transition to Torsemide.  - Continue metolazone 2.5 mg every other day.   2. OSA/OHS on CPAP - Encouraged nightly CPAP.   3. Morbid obesity - Body mass index is 48.09 kg/m.  - Needs weight loss.   4. Chronic hypoxic respiratory failure on home O2/COPD - Continue home O2.   5. HTN - Stable on current  meds.   6. DM2 - Per PCP   Shirley Friar, PA-C 05/15/18   Patient seen and examined with the above-signed Advanced Practice Provider and/or Housestaff. I personally reviewed laboratory data, imaging studies and relevant notes. I independently examined the patient and formulated the important aspects of the plan. I have edited the note to reflect any of my changes or salient points. I have personally discussed the plan with the patient and/or family.  Echo,labs and previous  notes reviewed personally.   Very pleasant 77 y/o woman with severe obesity and advanced OHS/OSA referred by Dr. Gwenlyn Found for assistance with volume management. Echo reveals normal LV function with RV strain. Long discussion with her and her husband regarding the pathphysiology or pulmonary HTN and cor pulmonale in setting of OHS/OSA. She is now essentially wheelchair bound.    Volume status improved with metolazone but would like to minimize dosing if possible and reserve metolazone only for prn use. Will change lasix to torsemide 6 bid and add spiro 12.5 daily. Start KCL. Will need labs in 1 week.   Stressed need for weight loss but given advanced symptoms and comorbidities not sure how feasible this will be very difficut proposition for her.   Glori Bickers, MD  10:03 PM

## 2018-05-16 ENCOUNTER — Other Ambulatory Visit (HOSPITAL_COMMUNITY): Payer: Self-pay

## 2018-05-16 DIAGNOSIS — I5032 Chronic diastolic (congestive) heart failure: Principal | ICD-10-CM

## 2018-05-16 DIAGNOSIS — I38 Endocarditis, valve unspecified: Secondary | ICD-10-CM

## 2018-05-16 MED ORDER — TORSEMIDE 20 MG PO TABS: 60.0000 mg | ORAL_TABLET | Freq: Two times a day (BID) | ORAL | 0 refills | Status: DC

## 2018-05-16 MED ORDER — METOLAZONE 2.5 MG PO TABS: 2.5000 mg | ORAL_TABLET | ORAL | 0 refills | Status: DC | PRN

## 2018-05-16 MED ORDER — SPIRONOLACTONE 25 MG PO TABS: 12.5000 mg | ORAL_TABLET | Freq: Every day | ORAL | 0 refills | Status: DC

## 2018-05-16 MED ORDER — POTASSIUM CHLORIDE CRYS ER 20 MEQ PO TBCR
20.0000 meq | EXTENDED_RELEASE_TABLET | Freq: Every day | ORAL | 0 refills | Status: DC
Start: 2018-05-16 — End: 2018-06-21

## 2018-05-27 ENCOUNTER — Telehealth: Payer: Self-pay | Admitting: Cardiovascular Disease

## 2018-05-27 ENCOUNTER — Other Ambulatory Visit (HOSPITAL_COMMUNITY): Payer: Self-pay | Admitting: *Deleted

## 2018-05-27 ENCOUNTER — Telehealth (HOSPITAL_COMMUNITY): Payer: Self-pay | Admitting: Cardiology

## 2018-05-27 MED ORDER — TORSEMIDE 20 MG PO TABS: 60.0000 mg | ORAL_TABLET | Freq: Two times a day (BID) | ORAL | 6 refills | Status: DC

## 2018-05-27 NOTE — Telephone Encounter (Signed)
Patient called very upset, was told by a nurse earlier that a RX for torsemide would be sent to her local pharmacy as she is still waiting on her mail order to deliver.  Spoke with pharmacist at [facility], they have received RX and have placed it on file. Will be more than happy to get medications ready for her as she will have to pay the cash price as her mail order has it file under her insurance. Cash price $36 #120 tabs   Spoke with representative at [facility], rx was received, processed, and mailed  on 05/16/18. Medications were delivered on 05/19/18 @ 9:33 am, patient will need to call customer service to report medications were not received [phone], they will file a report and have medications sent overnight to her.  Should she decide to only get medications from local pharmacy, the local pharmacy can call her insurance for a override.    Patient reports she will call customer service to let them know torsemide was not in delivery to have meds sent over night and she will get #12 tabs from local pharmacy

## 2018-05-27 NOTE — Telephone Encounter (Signed)
Dr Claiborne Billings is aware and this is currently being addressed.

## 2018-05-27 NOTE — Telephone Encounter (Signed)
New message  Corene Cornea states that the patient's office visit notes from 04/07/2018 they do not speak benefits of the CPAP. Please call Corene Cornea to discuss.

## 2018-05-28 NOTE — Progress Notes (Signed)
Advanced Heart Failure Note   Referring Physician: Dr. Gwenlyn Found PCP: Sharilyn Sites, MD PCP-Cardiologist: Quay Burow, MD  HF: Dr Haroldine Laws  HPI:  [name] is a 77 y.o. female with super morbid obesity, chronic diastolic CHF, DM2, COPD with 50 pack year history, OSA/OHS on CPAP, and HTN who was referred to the HF clinic by Dr. Gwenlyn Found for further evaluation and treatment of her diastolic HF.   She was last seen by Dr. Gwenlyn Found 04/04/18. Noted to have 1-2+ edema despite high-dose diuretics. Metolazone added for 2.5 mg every other day and referred to HF clinic for further evaluation and recommendations.   She presents today for regular follow up. Last visit, lasix 80 mg BID was switched to torsemide 60 mg daily. She was also taken off of scheduled metolazone and started on spiro. Overall doing fine. Main complaint is ongoing back pain. Her PCP and orthopedist are working on further work up. SOB is about the same. She is SOB with walking from room to room. Not SOB with ADLs. She sleeps sitting up in lift chair. Denies edema. Wearing CPAP qHS and 3 L O2 at all times. Denies dizziness. No cough, fever, or chills. She limits fluid and salt intake. Her LLE is red and warm. She says it started 3 hours ago. No injuries. Taking all medications. Limits fluid and salt intake. She has aides from [facility] but is not doing PT. Weight is down 15 lbs on home scale after switch to torsemide.   Echo 01/18/18 LVEF 65-70%, Mod RV reduction, + RV strain with septal flattening Trivial TR. Unable to assess RVSP accurately. Personally reviewed  Review of systems complete and found to be negative unless listed in HPI.   Past Medical History:  Diagnosis Date  . Anemia   . Arthritis   . Bronchitis 05/2014  . Cellulitis of lower extremity 06/2014   bilateral  . CHF (congestive heart failure) ([place])   . COPD (chronic obstructive pulmonary disease) ([place])   . Diabetes mellitus   . Diverticulitis   . Gallstone   .  Hypertension   . MRSA (methicillin resistant Staphylococcus aureus)    has now "tested negative"- no open wounds as of 02-10-15  . Obesity   . On home O2   . Shortness of breath    with exertion   . Sleep apnea    cpap at 10   . Sleep apnea   . Thyroid disease   . Tubular adenoma of colon     Current Outpatient Medications  Medication Sig Dispense Refill  . antiseptic oral rinse (BIOTENE) LIQD 15 mLs by Mouth Rinse route as needed for dry mouth.    Marland Kitchen aspirin 81 MG tablet Take 81 mg by mouth at bedtime.     . budesonide-formoterol (SYMBICORT) 160-4.5 MCG/ACT inhaler Inhale 2 puffs into the lungs 2 (two) times daily.    . Calcium Carb-Cholecalciferol (CALCIUM 600 + D PO) Take 2 tablets by mouth daily.    . diphenhydrAMINE (BENADRYL) 25 mg capsule Take 25 mg by mouth every 6 (six) hours as needed for itching.    Marland Kitchen doxycycline (VIBRA-TABS) 100 MG tablet Take 1 tablet (100 mg total) by mouth 2 (two) times daily. Take until bottle empty. Take with food. 14 tablet 0  . fluconazole (DIFLUCAN) 150 MG tablet Take 150 mg by mouth daily. TAKING 1 EVERY FOURTH DAY    . Glucosamine-Chondroit-Vit C-Mn (GLUCOSAMINE 1500 COMPLEX PO) Take 2 tablets by mouth daily.    Marland Kitchen ipratropium-albuterol (  DUONEB) 0.5-2.5 (3) MG/3ML SOLN Take 3 mLs by nebulization every 4 (four) hours as needed (for wheezing or shortness of breath).     Marland Kitchen JANUVIA 100 MG tablet Take 100 mg by mouth daily.     Marland Kitchen levothyroxine (SYNTHROID, LEVOTHROID) 50 MCG tablet Take 50 mcg by mouth daily.    . metFORMIN (GLUCOPHAGE) 500 MG tablet Take 1,000 mg by mouth 2 (two) times daily with a meal.     . metolazone (ZAROXOLYN) 2.5 MG tablet Take 1 tablet (2.5 mg total) by mouth as needed. 10 tablet 0  . montelukast (SINGULAIR) 10 MG tablet Take 10 mg by mouth daily.     . mupirocin cream (BACTROBAN) 2 % Apply to the right buttock wound twice a day. 30 g 0  . NAFTIN 2 % GEL     . nystatin (MYCOSTATIN/NYSTOP) powder Apply topically 2 (two) times  daily. 30 g 0  . polyethylene glycol (MIRALAX / GLYCOLAX) packet Take 17 g by mouth daily with lunch.     . potassium chloride SA (K-DUR,KLOR-CON) 20 MEQ tablet Take 1 tablet (20 mEq total) by mouth daily. 30 tablet 0  . spironolactone (ALDACTONE) 25 MG tablet Take 0.5 tablets (12.5 mg total) by mouth daily. 15 tablet 0  . torsemide (DEMADEX) 20 MG tablet Take 3 tablets (60 mg total) by mouth 2 (two) times daily. 180 tablet 6  . traMADol (ULTRAM) 50 MG tablet Take 50 mg by mouth every 4 (four) hours as needed (for pain).     . Wheat Dextrin (BENEFIBER) POWD Take 15 g by mouth daily.      No current facility-administered medications for this encounter.    Facility-Administered Medications Ordered in Other Encounters  Medication Dose Route Frequency Provider Last Rate Last Dose  . 0.9 %  sodium chloride infusion   Intravenous Continuous Penland, Kelby Fam, MD   Stopped at 07/23/16 1512   Allergies  Allergen Reactions  . Adhesive [Tape] Hives and Rash  . Codeine Itching and Other (See Comments)  . Lovenox [Enoxaparin Sodium]     Local rash May 2019 w/ppx dosing  . Vancomycin Itching and Rash  . Zosyn [Piperacillin Sod-Tazobactam So] Itching and Rash    Social History   Socioeconomic History  . Marital status: Married    Spouse name: Not on file  . Number of children: 0  . Years of education: Not on file  . Highest education level: Not on file  Occupational History  . Occupation: Retired  Scientific laboratory technician  . Financial resource strain: Not on file  . Food insecurity:    Worry: Not on file    Inability: Not on file  . Transportation needs:    Medical: Not on file    Non-medical: Not on file  Tobacco Use  . Smoking status: Former Smoker    Last attempt to quit: 09/24/2010    Years since quitting: 7.6  . Smokeless tobacco: Never Used  Substance and Sexual Activity  . Alcohol use: No    Alcohol/week: 0.0 standard drinks  . Drug use: No  . Sexual activity: Not Currently    Birth  control/protection: Surgical  Lifestyle  . Physical activity:    Days per week: Not on file    Minutes per session: Not on file  . Stress: Not on file  Relationships  . Social connections:    Talks on phone: Not on file    Gets together: Not on file    Attends religious service: Not  on file    Active member of club or organization: Not on file    Attends meetings of clubs or organizations: Not on file    Relationship status: Not on file  . Intimate partner violence:    Fear of current or ex partner: Not on file    Emotionally abused: Not on file    Physically abused: Not on file    Forced sexual activity: Not on file  Other Topics Concern  . Not on file  Social History Narrative  . Not on file   Family History  Problem Relation Age of Onset  . Heart disease Father   . Diabetes Father   . Hypertension Father   . Diabetes Mother   . Hypertension Mother   . Heart disease Brother   . Diabetes Brother   . Diabetes Sister   . Colon cancer Neg Hx   . Colon polyps Neg Hx   . Esophageal cancer Neg Hx   . Kidney disease Neg Hx   . Gallbladder disease Neg Hx    Vitals:   05/29/18 1411  BP: 110/62  Pulse: 71  SpO2: 95%  Weight: 126 kg (277 lb 12.8 oz)   Wt Readings from Last 3 Encounters:  05/29/18 126 kg (277 lb 12.8 oz)  05/15/18 131.1 kg (289 lb)  04/07/18 135 kg (297 lb 9.6 oz)    PHYSICAL EXAM: General: Obese. Chronically ill appearing. In wheelchair HEENT: Normal Neck: Supple. JVP difficult but does not appear elevated. Carotids 2+ bilat; no bruits. No thyromegaly or nodule noted. Cor: PMI nondisplaced. RRR, No M/G/R noted Lungs: CTAB, normal effort. Abdomen: Soft, non-tender, non-distended, no HSM. No bruits or masses. +BS  Extremities: No cyanosis, +clubbing. R and LLE nonpitting edema. Chronic venous changes. LLE erythematous and warm to touch. No open sores. Neuro: Alert & orientedx3, cranial nerves grossly intact. moves all 4 extremities w/o difficulty.  Affect pleasant   ASSESSMENT & PLAN:  1. Chronic diastolic CHF - Echo 2/48/25 LVEF 65-70%, Mod RV reduction, Trivial TR - Volume status difficult due to body habitus, but appears stable. - Continue torsemide 60 mg daily. Metolazone only PRN - Continue spiro 12.5 mg daily. BMET today. Will not increase with soft pressures.   2. OSA/OHS on CPAP - Encouraged nightly CPAP.   3. Morbid obesity - Body mass index is 46.23 kg/m.  - Needs weight loss.   4. Chronic hypoxic respiratory failure on home O2/COPD - Continue home O2. No change.  5. HTN - Stable on current meds. No change.   6. DM2 - Per PCP  7. LLE Cellulitis - Will prescribe doxy 100 mg BID x 7 days. She needs to follow up with PCP if she does not have improvement.  BMET today Doxy 100 mg BID x 7 days for LLE cellulitis F/u 4 weeks  Georgiana Shore, NP 05/29/18  Greater than 50% of the 25 minute visit was spent in counseling/coordination of care regarding disease state education, salt/fluid restriction, sliding scale diuretics, and medication compliance.

## 2018-05-29 ENCOUNTER — Ambulatory Visit (HOSPITAL_COMMUNITY)
Admission: RE | Admit: 2018-05-29 | Discharge: 2018-05-29 | Disposition: A | Discharge status: Home / Self Care | Payer: Medicare Other | Source: Ambulatory Visit | Attending: Internal Medicine | Admitting: Internal Medicine

## 2018-05-29 VITALS — BP 110/62 | HR 71 | Wt 277.8 lb

## 2018-05-29 DIAGNOSIS — Z7984 Long term (current) use of oral hypoglycemic drugs: Secondary | ICD-10-CM | POA: Insufficient documentation

## 2018-05-29 DIAGNOSIS — I5032 Chronic diastolic (congestive) heart failure: Secondary | ICD-10-CM | POA: Diagnosis not present

## 2018-05-29 DIAGNOSIS — Z8249 Family history of ischemic heart disease and other diseases of the circulatory system: Secondary | ICD-10-CM | POA: Insufficient documentation

## 2018-05-29 DIAGNOSIS — J9611 Chronic respiratory failure with hypoxia: Secondary | ICD-10-CM | POA: Insufficient documentation

## 2018-05-29 DIAGNOSIS — J449 Chronic obstructive pulmonary disease, unspecified: Secondary | ICD-10-CM | POA: Insufficient documentation

## 2018-05-29 DIAGNOSIS — I110 Hypertensive heart disease with heart failure: Secondary | ICD-10-CM | POA: Diagnosis not present

## 2018-05-29 DIAGNOSIS — Z881 Allergy status to other antibiotic agents status: Secondary | ICD-10-CM | POA: Insufficient documentation

## 2018-05-29 DIAGNOSIS — Z833 Family history of diabetes mellitus: Secondary | ICD-10-CM | POA: Insufficient documentation

## 2018-05-29 DIAGNOSIS — Z9981 Dependence on supplemental oxygen: Secondary | ICD-10-CM | POA: Diagnosis not present

## 2018-05-29 DIAGNOSIS — M1990 Unspecified osteoarthritis, unspecified site: Secondary | ICD-10-CM | POA: Diagnosis not present

## 2018-05-29 DIAGNOSIS — I5033 Acute on chronic diastolic (congestive) heart failure: Secondary | ICD-10-CM

## 2018-05-29 DIAGNOSIS — Z888 Allergy status to other drugs, medicaments and biological substances status: Secondary | ICD-10-CM | POA: Diagnosis not present

## 2018-05-29 DIAGNOSIS — Z87891 Personal history of nicotine dependence: Secondary | ICD-10-CM | POA: Insufficient documentation

## 2018-05-29 DIAGNOSIS — Z6841 Body Mass Index (BMI) 40.0 and over, adult: Secondary | ICD-10-CM | POA: Insufficient documentation

## 2018-05-29 DIAGNOSIS — Z79899 Other long term (current) drug therapy: Secondary | ICD-10-CM | POA: Diagnosis not present

## 2018-05-29 DIAGNOSIS — E079 Disorder of thyroid, unspecified: Secondary | ICD-10-CM | POA: Insufficient documentation

## 2018-05-29 DIAGNOSIS — Z885 Allergy status to narcotic agent status: Secondary | ICD-10-CM | POA: Insufficient documentation

## 2018-05-29 DIAGNOSIS — Z7982 Long term (current) use of aspirin: Secondary | ICD-10-CM | POA: Insufficient documentation

## 2018-05-29 DIAGNOSIS — E119 Type 2 diabetes mellitus without complications: Secondary | ICD-10-CM | POA: Diagnosis not present

## 2018-05-29 DIAGNOSIS — L03119 Cellulitis of unspecified part of limb: Secondary | ICD-10-CM

## 2018-05-29 DIAGNOSIS — E662 Morbid (severe) obesity with alveolar hypoventilation: Secondary | ICD-10-CM

## 2018-05-29 DIAGNOSIS — Z79890 Hormone replacement therapy (postmenopausal): Secondary | ICD-10-CM | POA: Insufficient documentation

## 2018-05-29 DIAGNOSIS — L03116 Cellulitis of left lower limb: Secondary | ICD-10-CM | POA: Diagnosis not present

## 2018-05-29 DIAGNOSIS — I38 Endocarditis, valve unspecified: Secondary | ICD-10-CM

## 2018-05-29 DIAGNOSIS — Z7951 Long term (current) use of inhaled steroids: Secondary | ICD-10-CM | POA: Insufficient documentation

## 2018-05-29 LAB — BASIC METABOLIC PANEL
ANION GAP: 16 — AB (ref 5–15)
BUN: 40 mg/dL — ABNORMAL HIGH (ref 8–23)
CHLORIDE: 89 mmol/L — AB (ref 98–111)
CO2: 29 mmol/L (ref 22–32)
Calcium: 9.9 mg/dL (ref 8.9–10.3)
Creatinine, Ser: 1.57 mg/dL — ABNORMAL HIGH (ref 0.44–1.00)
GFR calc non Af Amer: 30 mL/min — ABNORMAL LOW (ref >60)
GFR, EST AFRICAN AMERICAN: 35 mL/min — AB (ref >60)
Glucose, Bld: 87 mg/dL (ref 70–99)
Potassium: 4 mmol/L (ref 3.5–5.1)
Sodium: 134 mmol/L — ABNORMAL LOW (ref 135–145)

## 2018-05-29 MED ORDER — DOXYCYCLINE HYCLATE 100 MG PO TABS: 100.0000 mg | ORAL_TABLET | Freq: Two times a day (BID) | ORAL | 0 refills | Status: DC

## 2018-05-29 NOTE — Patient Instructions (Signed)
Routine lab work today. Will notify you of abnormal results, otherwise no news is good news!  START Doxycycline 100 mg tablet twice daily for one week (until bottle empty). Take with food to avoid stomach upset.  Follow up 4 weeks.  ______________________________________________________________ [name] Code: 2241  Take all medication as prescribed the day of your appointment. Bring all medications with you to your appointment.  Do the following things EVERYDAY: 1) Weigh yourself in the morning before breakfast. Write it down and keep it in a log. 2) Take your medicines as prescribed 3) Eat low salt foods-Limit salt (sodium) to 2000 mg per day.  4) Stay as active as you can everyday 5) Limit all fluids for the day to less than 2 liters

## 2018-06-19 ENCOUNTER — Emergency Department (HOSPITAL_COMMUNITY): Payer: Medicare Other

## 2018-06-19 ENCOUNTER — Encounter (HOSPITAL_COMMUNITY): Payer: Self-pay

## 2018-06-19 ENCOUNTER — Inpatient Hospital Stay (HOSPITAL_COMMUNITY): Payer: Medicare Other

## 2018-06-19 ENCOUNTER — Inpatient Hospital Stay (HOSPITAL_COMMUNITY)
Admission: EM | Admit: 2018-06-19 | Discharge: 2018-06-21 | DRG: 374 | Disposition: A | Discharge status: Hospice | Payer: Medicare Other | Attending: Internal Medicine | Admitting: Internal Medicine

## 2018-06-19 ENCOUNTER — Other Ambulatory Visit: Payer: Self-pay

## 2018-06-19 DIAGNOSIS — K661 Hemoperitoneum: Secondary | ICD-10-CM | POA: Diagnosis not present

## 2018-06-19 DIAGNOSIS — C786 Secondary malignant neoplasm of retroperitoneum and peritoneum: Principal | ICD-10-CM | POA: Diagnosis present

## 2018-06-19 DIAGNOSIS — N183 Chronic kidney disease, stage 3 unspecified: Secondary | ICD-10-CM | POA: Diagnosis present

## 2018-06-19 DIAGNOSIS — E875 Hyperkalemia: Secondary | ICD-10-CM | POA: Diagnosis present

## 2018-06-19 DIAGNOSIS — D508 Other iron deficiency anemias: Secondary | ICD-10-CM | POA: Diagnosis not present

## 2018-06-19 DIAGNOSIS — R1011 Right upper quadrant pain: Secondary | ICD-10-CM

## 2018-06-19 DIAGNOSIS — J9611 Chronic respiratory failure with hypoxia: Secondary | ICD-10-CM

## 2018-06-19 DIAGNOSIS — G4733 Obstructive sleep apnea (adult) (pediatric): Secondary | ICD-10-CM | POA: Diagnosis not present

## 2018-06-19 DIAGNOSIS — J449 Chronic obstructive pulmonary disease, unspecified: Secondary | ICD-10-CM | POA: Diagnosis present

## 2018-06-19 DIAGNOSIS — R180 Malignant ascites: Secondary | ICD-10-CM | POA: Diagnosis not present

## 2018-06-19 DIAGNOSIS — J9621 Acute and chronic respiratory failure with hypoxia: Secondary | ICD-10-CM | POA: Diagnosis not present

## 2018-06-19 DIAGNOSIS — K861 Other chronic pancreatitis: Secondary | ICD-10-CM | POA: Diagnosis not present

## 2018-06-19 DIAGNOSIS — G893 Neoplasm related pain (acute) (chronic): Secondary | ICD-10-CM | POA: Diagnosis not present

## 2018-06-19 DIAGNOSIS — I739 Peripheral vascular disease, unspecified: Secondary | ICD-10-CM

## 2018-06-19 DIAGNOSIS — Z79890 Hormone replacement therapy (postmenopausal): Secondary | ICD-10-CM

## 2018-06-19 DIAGNOSIS — E1122 Type 2 diabetes mellitus with diabetic chronic kidney disease: Secondary | ICD-10-CM | POA: Diagnosis present

## 2018-06-19 DIAGNOSIS — Z7982 Long term (current) use of aspirin: Secondary | ICD-10-CM

## 2018-06-19 DIAGNOSIS — I5033 Acute on chronic diastolic (congestive) heart failure: Secondary | ICD-10-CM

## 2018-06-19 DIAGNOSIS — Z6841 Body Mass Index (BMI) 40.0 and over, adult: Secondary | ICD-10-CM

## 2018-06-19 DIAGNOSIS — D509 Iron deficiency anemia, unspecified: Secondary | ICD-10-CM | POA: Diagnosis present

## 2018-06-19 DIAGNOSIS — R6510 Systemic inflammatory response syndrome (SIRS) of non-infectious origin without acute organ dysfunction: Secondary | ICD-10-CM | POA: Diagnosis not present

## 2018-06-19 DIAGNOSIS — I130 Hypertensive heart and chronic kidney disease with heart failure and stage 1 through stage 4 chronic kidney disease, or unspecified chronic kidney disease: Secondary | ICD-10-CM | POA: Diagnosis present

## 2018-06-19 DIAGNOSIS — Z66 Do not resuscitate: Secondary | ICD-10-CM | POA: Diagnosis not present

## 2018-06-19 DIAGNOSIS — R21 Rash and other nonspecific skin eruption: Secondary | ICD-10-CM | POA: Diagnosis present

## 2018-06-19 DIAGNOSIS — Z515 Encounter for palliative care: Secondary | ICD-10-CM | POA: Diagnosis not present

## 2018-06-19 DIAGNOSIS — I2781 Cor pulmonale (chronic): Secondary | ICD-10-CM | POA: Diagnosis not present

## 2018-06-19 DIAGNOSIS — N189 Chronic kidney disease, unspecified: Secondary | ICD-10-CM | POA: Diagnosis not present

## 2018-06-19 DIAGNOSIS — L03311 Cellulitis of abdominal wall: Secondary | ICD-10-CM | POA: Diagnosis present

## 2018-06-19 DIAGNOSIS — Z888 Allergy status to other drugs, medicaments and biological substances status: Secondary | ICD-10-CM

## 2018-06-19 DIAGNOSIS — Z79891 Long term (current) use of opiate analgesic: Secondary | ICD-10-CM

## 2018-06-19 DIAGNOSIS — D500 Iron deficiency anemia secondary to blood loss (chronic): Secondary | ICD-10-CM | POA: Diagnosis not present

## 2018-06-19 DIAGNOSIS — N179 Acute kidney failure, unspecified: Secondary | ICD-10-CM | POA: Diagnosis not present

## 2018-06-19 DIAGNOSIS — K810 Acute cholecystitis: Secondary | ICD-10-CM | POA: Diagnosis not present

## 2018-06-19 DIAGNOSIS — Z79899 Other long term (current) drug therapy: Secondary | ICD-10-CM

## 2018-06-19 DIAGNOSIS — R230 Cyanosis: Secondary | ICD-10-CM | POA: Diagnosis present

## 2018-06-19 DIAGNOSIS — Z8614 Personal history of Methicillin resistant Staphylococcus aureus infection: Secondary | ICD-10-CM

## 2018-06-19 DIAGNOSIS — Z833 Family history of diabetes mellitus: Secondary | ICD-10-CM

## 2018-06-19 DIAGNOSIS — R1903 Right lower quadrant abdominal swelling, mass and lump: Secondary | ICD-10-CM | POA: Diagnosis present

## 2018-06-19 DIAGNOSIS — Z9981 Dependence on supplemental oxygen: Secondary | ICD-10-CM

## 2018-06-19 DIAGNOSIS — Z8249 Family history of ischemic heart disease and other diseases of the circulatory system: Secondary | ICD-10-CM

## 2018-06-19 DIAGNOSIS — Z90710 Acquired absence of both cervix and uterus: Secondary | ICD-10-CM

## 2018-06-19 DIAGNOSIS — Z7984 Long term (current) use of oral hypoglycemic drugs: Secondary | ICD-10-CM

## 2018-06-19 DIAGNOSIS — Z9049 Acquired absence of other specified parts of digestive tract: Secondary | ICD-10-CM

## 2018-06-19 DIAGNOSIS — Z91048 Other nonmedicinal substance allergy status: Secondary | ICD-10-CM

## 2018-06-19 DIAGNOSIS — Z791 Long term (current) use of non-steroidal anti-inflammatories (NSAID): Secondary | ICD-10-CM

## 2018-06-19 DIAGNOSIS — Z885 Allergy status to narcotic agent status: Secondary | ICD-10-CM

## 2018-06-19 DIAGNOSIS — Z87891 Personal history of nicotine dependence: Secondary | ICD-10-CM

## 2018-06-19 DIAGNOSIS — E66813 Obesity, class 3: Secondary | ICD-10-CM | POA: Diagnosis present

## 2018-06-19 DIAGNOSIS — Z7189 Other specified counseling: Secondary | ICD-10-CM | POA: Diagnosis not present

## 2018-06-19 DIAGNOSIS — C801 Malignant (primary) neoplasm, unspecified: Secondary | ICD-10-CM

## 2018-06-19 DIAGNOSIS — E039 Hypothyroidism, unspecified: Secondary | ICD-10-CM | POA: Diagnosis present

## 2018-06-19 DIAGNOSIS — Z7951 Long term (current) use of inhaled steroids: Secondary | ICD-10-CM

## 2018-06-19 DIAGNOSIS — Z881 Allergy status to other antibiotic agents status: Secondary | ICD-10-CM

## 2018-06-19 LAB — CBC WITH DIFFERENTIAL/PLATELET
ABS IMMATURE GRANULOCYTES: 0.3 10*3/uL — AB (ref 0.0–0.1)
BASOS ABS: 0.1 10*3/uL (ref 0.0–0.1)
BASOS PCT: 0 %
Basophils Absolute: 0.1 10*3/uL (ref 0.0–0.1)
Basophils Relative: 1 %
EOS ABS: 0.1 10*3/uL (ref 0.0–0.7)
EOS ABS: 0 10*3/uL (ref 0.0–0.7)
EOS PCT: 1 %
Eosinophils Relative: 0 %
HCT: 48.3 % — ABNORMAL HIGH (ref 36.0–46.0)
HCT: 48.6 % — ABNORMAL HIGH (ref 36.0–46.0)
Hemoglobin: 15.1 g/dL — ABNORMAL HIGH (ref 12.0–15.0)
Hemoglobin: 14.4 g/dL (ref 12.0–15.0)
IMMATURE GRANULOCYTES: 2 %
Lymphocytes Relative: 11 %
Lymphocytes Relative: 7 %
Lymphs Abs: 1.9 10*3/uL (ref 0.7–4.0)
Lymphs Abs: 1.4 10*3/uL (ref 0.7–4.0)
MCH: 23.9 pg — ABNORMAL LOW (ref 26.0–34.0)
MCH: 23 pg — ABNORMAL LOW (ref 26.0–34.0)
MCHC: 31.3 g/dL (ref 30.0–36.0)
MCHC: 29.6 g/dL — ABNORMAL LOW (ref 30.0–36.0)
MCV: 76.4 fL — ABNORMAL LOW (ref 78.0–100.0)
MCV: 77.5 fL — ABNORMAL LOW (ref 78.0–100.0)
MONO ABS: 1.2 10*3/uL — AB (ref 0.1–1.0)
Monocytes Absolute: 1.6 10*3/uL — ABNORMAL HIGH (ref 0.1–1.0)
Monocytes Relative: 7 %
Monocytes Relative: 8 %
NEUTROS ABS: 17.3 10*3/uL — AB (ref 1.7–7.7)
NEUTROS PCT: 84 %
Neutro Abs: 14.6 10*3/uL — ABNORMAL HIGH (ref 1.7–7.7)
Neutrophils Relative %: 81 %
PLATELETS: 549 10*3/uL — AB (ref 150–400)
PLATELETS: 562 10*3/uL — AB (ref 150–400)
RBC: 6.32 MIL/uL — ABNORMAL HIGH (ref 3.87–5.11)
RBC: 6.27 MIL/uL — AB (ref 3.87–5.11)
RDW: 17.1 % — AB (ref 11.5–15.5)
RDW: 18.7 % — ABNORMAL HIGH (ref 11.5–15.5)
WBC: 17.8 10*3/uL — AB (ref 4.0–10.5)
WBC: 20.6 10*3/uL — AB (ref 4.0–10.5)

## 2018-06-19 LAB — PROTIME-INR
INR: 1.07
Prothrombin Time: 13.8 seconds (ref 11.4–15.2)

## 2018-06-19 LAB — BLOOD GAS, ARTERIAL
ACID-BASE DEFICIT: 3.2 mmol/L — AB (ref 0.0–2.0)
Bicarbonate: 22.3 mmol/L (ref 20.0–28.0)
Drawn by: 277331
O2 CONTENT: 3 L/min
O2 SAT: 98.8 %
PATIENT TEMPERATURE: 37
PCO2 ART: 33 mmHg (ref 32.0–48.0)
PO2 ART: 145 mmHg — AB (ref 83.0–108.0)
pH, Arterial: 7.414 (ref 7.350–7.450)

## 2018-06-19 LAB — COMPREHENSIVE METABOLIC PANEL
ALBUMIN: 3.5 g/dL (ref 3.5–5.0)
ALBUMIN: 3.3 g/dL — AB (ref 3.5–5.0)
ALT: 12 U/L (ref 0–44)
ALT: 11 U/L (ref 0–44)
ANION GAP: 13 (ref 5–15)
AST: 19 U/L (ref 15–41)
AST: 18 U/L (ref 15–41)
Alkaline Phosphatase: 102 U/L (ref 38–126)
Alkaline Phosphatase: 123 U/L (ref 38–126)
Anion gap: 16 — ABNORMAL HIGH (ref 5–15)
BUN: 44 mg/dL — ABNORMAL HIGH (ref 8–23)
BUN: 45 mg/dL — AB (ref 8–23)
CHLORIDE: 94 mmol/L — AB (ref 98–111)
CHLORIDE: 95 mmol/L — AB (ref 98–111)
CO2: 25 mmol/L (ref 22–32)
CO2: 25 mmol/L (ref 22–32)
Calcium: 9.3 mg/dL (ref 8.9–10.3)
Calcium: 9.3 mg/dL (ref 8.9–10.3)
Creatinine, Ser: 1.85 mg/dL — ABNORMAL HIGH (ref 0.44–1.00)
Creatinine, Ser: 2.49 mg/dL — ABNORMAL HIGH (ref 0.44–1.00)
GFR calc Af Amer: 29 mL/min — ABNORMAL LOW (ref >60)
GFR calc Af Amer: 20 mL/min — ABNORMAL LOW (ref >60)
GFR calc non Af Amer: 25 mL/min — ABNORMAL LOW (ref >60)
GFR calc non Af Amer: 17 mL/min — ABNORMAL LOW (ref >60)
GLUCOSE: 174 mg/dL — AB (ref 70–99)
GLUCOSE: 175 mg/dL — AB (ref 70–99)
POTASSIUM: 5 mmol/L (ref 3.5–5.1)
Potassium: 5.7 mmol/L — ABNORMAL HIGH (ref 3.5–5.1)
SODIUM: 136 mmol/L (ref 135–145)
Sodium: 132 mmol/L — ABNORMAL LOW (ref 135–145)
Total Bilirubin: 1.1 mg/dL (ref 0.3–1.2)
Total Bilirubin: 1 mg/dL (ref 0.3–1.2)
Total Protein: 7.5 g/dL (ref 6.5–8.1)
Total Protein: 5.7 g/dL — ABNORMAL LOW (ref 6.5–8.1)

## 2018-06-19 LAB — APTT: aPTT: 29 seconds (ref 24–36)

## 2018-06-19 LAB — URINALYSIS, ROUTINE W REFLEX MICROSCOPIC
Bilirubin Urine: NEGATIVE
GLUCOSE, UA: NEGATIVE mg/dL
HGB URINE DIPSTICK: NEGATIVE
Ketones, ur: NEGATIVE mg/dL
LEUKOCYTES UA: NEGATIVE
NITRITE: NEGATIVE
PH: 5 (ref 5.0–8.0)
PROTEIN: 30 mg/dL — AB
Specific Gravity, Urine: 1.023 (ref 1.005–1.030)

## 2018-06-19 LAB — TYPE AND SCREEN
ABO/RH(D): A NEG
ANTIBODY SCREEN: NEGATIVE

## 2018-06-19 LAB — GLUCOSE, CAPILLARY
Glucose-Capillary: 157 mg/dL — ABNORMAL HIGH (ref 70–99)
Glucose-Capillary: 145 mg/dL — ABNORMAL HIGH (ref 70–99)

## 2018-06-19 LAB — HEMOGLOBIN A1C
HEMOGLOBIN A1C: 6.4 % — AB (ref 4.8–5.6)
MEAN PLASMA GLUCOSE: 136.98 mg/dL

## 2018-06-19 LAB — I-STAT CG4 LACTIC ACID, ED
Lactic Acid, Venous: 3.38 mmol/L (ref 0.5–1.9)
Lactic Acid, Venous: 2.93 mmol/L (ref 0.5–1.9)

## 2018-06-19 LAB — RETICULOCYTES
RBC.: 6.27 MIL/uL — ABNORMAL HIGH (ref 3.87–5.11)
Retic Count, Absolute: 87.8 10*3/uL (ref 19.0–186.0)
Retic Ct Pct: 1.4 % (ref 0.4–3.1)

## 2018-06-19 LAB — CK: CK TOTAL: 26 U/L — AB (ref 38–234)

## 2018-06-19 LAB — BRAIN NATRIURETIC PEPTIDE: B NATRIURETIC PEPTIDE 5: 52 pg/mL (ref 0.0–100.0)

## 2018-06-19 LAB — LACTIC ACID, PLASMA: LACTIC ACID, VENOUS: 3 mmol/L — AB (ref 0.5–1.9)

## 2018-06-19 LAB — ABO/RH: ABO/RH(D): A NEG

## 2018-06-19 LAB — LIPASE, BLOOD: LIPASE: 79 U/L — AB (ref 11–51)

## 2018-06-19 LAB — TROPONIN I: Troponin I: <0.03 ng/mL (ref <0.03)

## 2018-06-19 MED ORDER — IPRATROPIUM-ALBUTEROL 0.5-2.5 (3) MG/3ML IN SOLN
3.0000 mL | Freq: Four times a day (QID) | RESPIRATORY_TRACT | Status: DC
  Administered 2018-06-19 – 2018-06-20 (×2): 3 mL via RESPIRATORY_TRACT
  Filled 2018-06-19 (×3): qty 3

## 2018-06-19 MED ORDER — METRONIDAZOLE IN NACL 5-0.79 MG/ML-% IV SOLN
500.0000 mg | Freq: Three times a day (TID) | INTRAVENOUS | Status: DC
  Filled 2018-06-19: qty 100

## 2018-06-19 MED ORDER — INSULIN ASPART 100 UNIT/ML ~~LOC~~ SOLN: 0.0000 [IU] | Freq: Every day | SUBCUTANEOUS | Status: DC

## 2018-06-19 MED ORDER — OXYCODONE HCL 5 MG PO TABS: 5.0000 mg | ORAL_TABLET | ORAL | Status: DC | PRN

## 2018-06-19 MED ORDER — FENTANYL CITRATE (PF) 100 MCG/2ML IJ SOLN
12.5000 ug | INTRAMUSCULAR | Status: DC | PRN
  Administered 2018-06-19: 25 ug via INTRAVENOUS
  Filled 2018-06-19: qty 2

## 2018-06-19 MED ORDER — ACETAMINOPHEN 325 MG PO TABS: 650.0000 mg | ORAL_TABLET | Freq: Four times a day (QID) | ORAL | Status: DC | PRN

## 2018-06-19 MED ORDER — SODIUM CHLORIDE 0.9 % IV SOLN
1.0000 g | Freq: Two times a day (BID) | INTRAVENOUS | Status: DC
  Administered 2018-06-19 (×2): 1 g via INTRAVENOUS
  Filled 2018-06-19 (×3): qty 1

## 2018-06-19 MED ORDER — INSULIN ASPART 100 UNIT/ML ~~LOC~~ SOLN
0.0000 [IU] | SUBCUTANEOUS | Status: DC
  Administered 2018-06-19: 1 [IU] via SUBCUTANEOUS
  Administered 2018-06-19 – 2018-06-20 (×3): 2 [IU] via SUBCUTANEOUS

## 2018-06-19 MED ORDER — ONDANSETRON HCL 4 MG/2ML IJ SOLN
4.0000 mg | Freq: Once | INTRAMUSCULAR | Status: AC
  Administered 2018-06-19: 4 mg via INTRAVENOUS
  Filled 2018-06-19: qty 2

## 2018-06-19 MED ORDER — ONDANSETRON HCL 4 MG/2ML IJ SOLN: 4.0000 mg | Freq: Four times a day (QID) | INTRAMUSCULAR | Status: DC | PRN

## 2018-06-19 MED ORDER — ONDANSETRON HCL 4 MG PO TABS: 4.0000 mg | ORAL_TABLET | Freq: Four times a day (QID) | ORAL | Status: DC | PRN

## 2018-06-19 MED ORDER — METRONIDAZOLE IN NACL 5-0.79 MG/ML-% IV SOLN
500.0000 mg | Freq: Once | INTRAVENOUS | Status: AC
  Administered 2018-06-19: 500 mg via INTRAVENOUS
  Filled 2018-06-19: qty 100

## 2018-06-19 MED ORDER — FENTANYL CITRATE (PF) 100 MCG/2ML IJ SOLN
50.0000 ug | Freq: Once | INTRAMUSCULAR | Status: AC
  Administered 2018-06-19: 50 ug via INTRAVENOUS
  Filled 2018-06-19: qty 2

## 2018-06-19 MED ORDER — HEPARIN SODIUM (PORCINE) 5000 UNIT/ML IJ SOLN: 5000.0000 [IU] | Freq: Three times a day (TID) | INTRAMUSCULAR | Status: DC

## 2018-06-19 MED ORDER — FENTANYL CITRATE (PF) 100 MCG/2ML IJ SOLN: 25.0000 ug | INTRAMUSCULAR | Status: DC | PRN

## 2018-06-19 MED ORDER — SODIUM CHLORIDE 0.9 % IV SOLN
100.0000 mg | Freq: Two times a day (BID) | INTRAVENOUS | Status: DC
  Administered 2018-06-19 – 2018-06-20 (×2): 100 mg via INTRAVENOUS
  Filled 2018-06-19 (×3): qty 100

## 2018-06-19 MED ORDER — FENTANYL CITRATE (PF) 100 MCG/2ML IJ SOLN
25.0000 ug | INTRAMUSCULAR | Status: DC | PRN
  Administered 2018-06-19: 25 ug via INTRAVENOUS
  Administered 2018-06-20 (×4): 50 ug via INTRAVENOUS
  Filled 2018-06-19 (×7): qty 2

## 2018-06-19 MED ORDER — SODIUM CHLORIDE 0.9 % IV SOLN
1.0000 g | Freq: Once | INTRAVENOUS | Status: AC
  Administered 2018-06-19: 1 g via INTRAVENOUS
  Filled 2018-06-19: qty 10

## 2018-06-19 MED ORDER — SODIUM CHLORIDE 0.9 % IV BOLUS
500.0000 mL | Freq: Once | INTRAVENOUS | Status: AC
  Administered 2018-06-19: 500 mL via INTRAVENOUS

## 2018-06-19 MED ORDER — ASPIRIN 81 MG PO CHEW: 81.0000 mg | CHEWABLE_TABLET | Freq: Every day | ORAL | Status: DC

## 2018-06-19 MED ORDER — SODIUM CHLORIDE 0.9 % IV SOLN
2.0000 g | INTRAVENOUS | Status: DC
  Filled 2018-06-19: qty 20

## 2018-06-19 MED ORDER — HYDROMORPHONE HCL 1 MG/ML IJ SOLN
1.0000 mg | Freq: Once | INTRAMUSCULAR | Status: AC
  Administered 2018-06-19: 1 mg via INTRAVENOUS
  Filled 2018-06-19: qty 1

## 2018-06-19 MED ORDER — INSULIN ASPART 100 UNIT/ML ~~LOC~~ SOLN: 0.0000 [IU] | Freq: Three times a day (TID) | SUBCUTANEOUS | Status: DC

## 2018-06-19 MED ORDER — ACETAMINOPHEN 650 MG RE SUPP: 650.0000 mg | Freq: Four times a day (QID) | RECTAL | Status: DC | PRN

## 2018-06-19 MED ORDER — FENTANYL CITRATE (PF) 100 MCG/2ML IJ SOLN
50.0000 ug | Freq: Once | INTRAMUSCULAR | Status: AC
  Administered 2018-06-19: 50 ug via INTRAVENOUS

## 2018-06-19 MED ORDER — FENTANYL CITRATE (PF) 100 MCG/2ML IJ SOLN
25.0000 ug | Freq: Once | INTRAMUSCULAR | Status: AC
  Administered 2018-06-19: 25 ug via INTRAVENOUS
  Filled 2018-06-19: qty 2

## 2018-06-19 MED ORDER — FUROSEMIDE 10 MG/ML IJ SOLN: 60.0000 mg | Freq: Two times a day (BID) | INTRAMUSCULAR | Status: DC

## 2018-06-19 MED ORDER — FUROSEMIDE 10 MG/ML IJ SOLN
60.0000 mg | Freq: Once | INTRAMUSCULAR | Status: AC
  Administered 2018-06-19: 60 mg via INTRAVENOUS
  Filled 2018-06-19: qty 6

## 2018-06-19 MED ORDER — LEVOTHYROXINE SODIUM 100 MCG IV SOLR: 25.0000 ug | Freq: Every day | INTRAVENOUS | Status: DC

## 2018-06-19 MED ORDER — ALBUTEROL SULFATE (2.5 MG/3ML) 0.083% IN NEBU
2.5000 mg | INHALATION_SOLUTION | RESPIRATORY_TRACT | Status: DC | PRN
  Administered 2018-06-19 – 2018-06-21 (×7): 2.5 mg via RESPIRATORY_TRACT
  Filled 2018-06-19 (×7): qty 3

## 2018-06-19 MED ORDER — TRAMADOL HCL 50 MG PO TABS: 50.0000 mg | ORAL_TABLET | ORAL | Status: DC | PRN

## 2018-06-19 MED ORDER — HYDROMORPHONE HCL 1 MG/ML IJ SOLN: 1.0000 mg | Freq: Once | INTRAMUSCULAR | Status: DC

## 2018-06-19 NOTE — Consult Note (Signed)
Palliative care brief note  I met this evening with [name] and her family.  Family reports being updated by doctors throughout the day today and understanding that "time is short."  Ms. Cake reports being "put through the ringer" and not wanting to discuss further this evening.  She continues to have significant pain and also reports SOB.  Discussed risk vs benefit of increasing opioid dosing, and she and family are in agreement that pain medication should be used as needed for pain regardless of concern for side effects to ensure she is not suffering.  Increased fentanyl to 25-71mg every 2 hours as needed.  Full note to follow.  GMicheline Rough MD [facility]amiamiTeam [phone]

## 2018-06-19 NOTE — Progress Notes (Signed)
Pharmacy Antibiotic Note  [name] is a 77 y.o. female admitted on 06/19/2018 with Intraabdominal infection and cellulitis. MD wishes to expand coverage given patient's existing allergies.  Pharmacy has been consulted for meropenem dosing.  Plan: Meropenem 1gm IV q12h Doxycycline 100mg  IV q12h per MD  Height: 5\' 5"  (165.1 cm) Weight: 270 lb (122.5 kg) IBW/kg (Calculated) : 57  Temp (24hrs), Avg:97.7 F (36.5 C), Min:97.6 F (36.4 C), Max:97.7 F (36.5 C)  Recent Labs  Lab 06/19/18 0629 06/19/18 0636 06/19/18 0848  WBC 17.8*  --   --   CREATININE 1.85*  --   --   LATICACIDVEN  --  3.38* 2.93*    Estimated Creatinine Clearance: 32.9 mL/min (A) (by C-G formula based on SCr of 1.85 mg/dL (H)).    Allergies  Allergen Reactions  . Adhesive [Tape] Hives and Rash  . Codeine Itching and Other (See Comments)  . Lovenox [Enoxaparin Sodium]     Local rash May 2019 w/ppx dosing Has tolerated SQ heparin in the past  . Vancomycin Itching and Rash  . Zosyn [Piperacillin Sod-Tazobactam So] Itching and Rash    Antimicrobials this admission: Ceftriaxone 9/26 x 1 Metronidazole 9/26 x 1 Doxycycline 9/26 >> Meropenem 9/26 >>   Dessire Grimes A. Levada Dy, PharmD, [place] Pager: [phone] Please utilize Amion for appropriate phone number to reach the unit pharmacist ([place])   06/19/2018 3:36 PM

## 2018-06-19 NOTE — ED Provider Notes (Signed)
[facility] EMERGENCY DEPARTMENT Provider Note   CSN: [id number] Arrival date & time: 06/19/18  0549     History   Chief Complaint Chief Complaint  Patient presents with  . Abdominal Pain    HPI [name] is a 77 y.o. female.   77 y.o. female with super morbid obesity, chronic diastolic CHF, DM2, COPD with 50 pack year history, OSA/OHS on CPAP, and HTN presents with right-sided upper abdominal pain that has been constant since last evening.  She denies any falls or trauma.  Denies any nausea or vomiting.  Denies any change in her bowel movements which have been regular and constant, last bowel movement yesterday.  She is never had this kind of pain before.  Previous appendectomy and hysterectomy.  Still has a gallbladder.  Denies dysuria hematuria.  Denies vaginal bleeding or discharge.  She arrives quite short of breath but states this is near her baseline and is due to moving around so much.  Denies chest pain, cough or fever.  She is on her 3 L of home oxygen without hypoxia.  The history is provided by the patient and the EMS personnel. The history is limited by the condition of the patient.  Abdominal Pain   Pertinent negatives include nausea, vomiting, dysuria, hematuria, headaches and arthralgias.    Past Medical History:  Diagnosis Date  . Anemia   . Arthritis   . Bronchitis 05/2014  . Cellulitis of lower extremity 06/2014   bilateral  . CHF (congestive heart failure) ([place])   . COPD (chronic obstructive pulmonary disease) ([place])   . Diabetes mellitus   . Diverticulitis   . Gallstone   . Hypertension   . MRSA (methicillin resistant Staphylococcus aureus)    has now "tested negative"- no open wounds as of 02-10-15  . Obesity   . On home O2   . Shortness of breath    with exertion   . Sleep apnea    cpap at 10   . Sleep apnea   . Thyroid disease   . Tubular adenoma of colon     Patient Active Problem List   Diagnosis Date Noted  . Acute exacerbation of CHF  (congestive heart failure) ([place]) 03/18/2018  . CHF exacerbation ([place]) 03/17/2018  . Buttock wound, right, initial encounter 03/17/2018  . Hypothyroidism 03/17/2018  . CKD (chronic kidney disease) stage 3, GFR 30-59 ml/min (HCC) 03/17/2018  . Decubitus ulcer of right buttock, stage 1   . Mild renal insufficiency 01/17/2018  . Acute on chronic respiratory failure with hypoxia ([place]) 01/17/2018  . Acute on chronic diastolic CHF (congestive heart failure) ([place]) 01/17/2018  . Bilateral lower extremity edema 07/17/2017  . 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 IDA (iron deficiency anemia)   . Leukocytosis 11/06/2014  . COPD (chronic obstructive pulmonary disease) ([place]) 06/05/2014  . Hypoxia 06/05/2014  . Iron deficiency anemia due to chronic blood loss 04/12/2014  . Postoperative MRSA infection of lower abdominal wound 03/22/2014  . Thrombocytosis ([place]) 03/20/2014  . Incisional hernia without mention of obstruction or gangrene 05/13/2013  . Cellulitis 03/14/2013  . Draining postoperative wound 01/27/2013  . S/P colon resection 06/27/2012  . Obesity, Class III, BMI 40-49.9 (morbid obesity) ([place]) 10/18/2011  . Diverticulitis of colon with perforation sigmoid colectomy and colostomy 1/15 10/09/2011  . Hypertension 10/09/2011  . Diabetes mellitus ([place]) 10/09/2011  . OBSTRUCTIVE SLEEP APNEA 03/10/2008    Past Surgical History:  Procedure Laterality Date  .  ABDOMINAL HYSTERECTOMY  1979  . ABDOMINAL HYSTERECTOMY  1978  . APPENDECTOMY    . APPLICATION OF WOUND VAC  wound vac  . APPLICATION OF WOUND VAC  06/26/2012   Procedure: APPLICATION OF WOUND VAC;  Surgeon: Gayland Curry, MD,FACS;  Location: [facility];  Service: General;;  . BACK SURGERY    . COLON RESECTION  10/09/2011   Procedure: COLON RESECTION;  Surgeon: Gayland Curry, MD;  Location: [place];  Service: General;  Laterality: N/A;  Colon Resection with colostomy  . COLON RESECTION  06/26/2012   Procedure:  COLON RESECTION;  Surgeon: Gayland Curry, MD,FACS;  Location: [facility];  Service: General;  Laterality: N/A;  . ESOPHAGOGASTRODUODENOSCOPY (EGD) WITH PROPOFOL N/A 02/17/2015   Procedure: ESOPHAGOGASTRODUODENOSCOPY (EGD) WITH PROPOFOL;  Surgeon: Jerene Bears, MD;  Location: [facility];  Service: Gastroenterology;  Laterality: N/A;  . jackson pratt    . PROCTOSCOPY  06/26/2012   Procedure: PROCTOSCOPY;  Surgeon: Gayland Curry, MD,FACS;  Location: [facility];  Service: General;  Laterality: N/A;  Rigid Proctoscopy     OB History   None      Home Medications    Prior to Admission medications   Medication Sig Start Date End Date Taking? Authorizing Provider  aspirin 81 MG tablet Take 81 mg by mouth at bedtime.    Yes [provider]  budesonide-formoterol (SYMBICORT) 160-4.5 MCG/ACT inhaler Inhale 2 puffs into the lungs 2 (two) times daily.   Yes [provider]  Calcium Carb-Cholecalciferol (CALCIUM 600 + D PO) Take 2 tablets by mouth daily.   Yes [provider]  diphenhydrAMINE (BENADRYL) 25 mg capsule Take 25 mg by mouth every 6 (six) hours as needed for itching.   Yes [provider]  doxycycline (VIBRA-TABS) 100 MG tablet Take 1 tablet (100 mg total) by mouth 2 (two) times daily. Take until bottle empty. Take with food. 05/29/18  Yes Georgiana Shore, NP  ipratropium-albuterol (DUONEB) 0.5-2.5 (3) MG/3ML SOLN Take 3 mLs by nebulization every 4 (four) hours as needed (for wheezing or shortness of breath).  01/10/18  Yes [provider]  JANUVIA 100 MG tablet Take 100 mg by mouth daily.  11/11/14  Yes [provider]  levothyroxine (SYNTHROID, LEVOTHROID) 50 MCG tablet Take 50 mcg by mouth daily. 08/04/15  Yes [provider]  metFORMIN (GLUCOPHAGE) 500 MG tablet Take 1,000 mg by mouth 2 (two) times daily with a meal.    Yes [provider]  metolazone (ZAROXOLYN) 2.5 MG tablet Take 1 tablet (2.5 mg total) by mouth as needed.  05/16/18 08/14/18 Yes Bensimhon, Shaune Pascal, MD  montelukast (SINGULAIR) 10 MG tablet Take 10 mg by mouth daily.    Yes [provider]  polyethylene glycol (MIRALAX / GLYCOLAX) packet Take 17 g by mouth daily with lunch.    Yes [provider]  potassium chloride SA (K-DUR,KLOR-CON) 20 MEQ tablet Take 1 tablet (20 mEq total) by mouth daily. 05/16/18  Yes Bensimhon, Shaune Pascal, MD  torsemide (DEMADEX) 20 MG tablet Take 3 tablets (60 mg total) by mouth 2 (two) times daily. 05/27/18 08/25/18 Yes Bensimhon, Shaune Pascal, MD  traMADol (ULTRAM) 50 MG tablet Take 50 mg by mouth every 4 (four) hours as needed (for pain).  01/09/12  Yes [provider]  Wheat Dextrin (BENEFIBER) POWD Take 15 g by mouth daily.    Yes [provider]  antiseptic oral rinse (BIOTENE) LIQD 15 mLs by Mouth Rinse route as needed  for dry mouth.    [provider]  fluconazole (DIFLUCAN) 150 MG tablet Take 150 mg by mouth daily. TAKING 1 [place]    [provider]  Glucosamine-Chondroit-Vit C-Mn (GLUCOSAMINE 1500 COMPLEX PO) Take 2 tablets by mouth daily.    [provider]  mupirocin cream (BACTROBAN) 2 % Apply to the right buttock wound twice a day. 03/18/18   Murlean Iba, MD  NAFTIN 2 % GEL  03/25/18   [provider]  nystatin (MYCOSTATIN/NYSTOP) powder Apply topically 2 (two) times daily. 03/18/18   Johnson, Clanford L, MD  spironolactone (ALDACTONE) 25 MG tablet Take 0.5 tablets (12.5 mg total) by mouth daily. 05/16/18 08/14/18  Bensimhon, Shaune Pascal, MD    Family History Family History  Problem Relation Age of Onset  . Heart disease Father   . Diabetes Father   . Hypertension Father   . Diabetes Mother   . Hypertension Mother   . Heart disease Brother   . Diabetes Brother   . Diabetes Sister   . Colon cancer Neg Hx   . Colon polyps Neg Hx   . Esophageal cancer Neg Hx   . Kidney disease Neg Hx   . Gallbladder disease Neg Hx     Social  History Social History   Tobacco Use  . Smoking status: Former Smoker    Last attempt to quit: 09/24/2010    Years since quitting: 7.7  . Smokeless tobacco: Never Used  Substance Use Topics  . Alcohol use: No    Alcohol/week: 0.0 standard drinks  . Drug use: No     Allergies   Adhesive [tape]; Codeine; Lovenox [enoxaparin sodium]; Vancomycin; and Zosyn [piperacillin sod-tazobactam so]   Review of Systems Review of Systems  Constitutional: Negative for activity change and appetite change.  Eyes: Negative for visual disturbance.  Respiratory: Positive for shortness of breath.   Cardiovascular: Positive for leg swelling. Negative for chest pain.  Gastrointestinal: Positive for abdominal pain. Negative for anal bleeding, blood in stool, nausea and vomiting.  Endocrine: Negative for polyphagia and polyuria.  Genitourinary: Negative for dysuria, hematuria, vaginal bleeding and vaginal discharge.  Musculoskeletal: Negative for arthralgias.  Neurological: Positive for weakness. Negative for dizziness and headaches.   all other systems are negative except as noted in the HPI and PMH.     Physical Exam Updated Vital Signs BP 121/71 (BP Location: Right Arm)   Pulse (!) 104   Temp 97.7 F (36.5 C) (Oral)   Resp (!) 24   Ht 5\' 5"  (1.651 m)   Wt 122.5 kg   SpO2 96%   BMI 44.93 kg/m   Physical Exam  Constitutional: She is oriented to person, place, and time. She appears well-developed and well-nourished. She appears ill. She appears distressed.  Morbidly obese, chronically ill-appearing, tachypneic and short of breath with conversation  HENT:  Head: Normocephalic and atraumatic.  Mouth/Throat: Oropharynx is clear and moist. No oropharyngeal exudate.  Eyes: Pupils are equal, round, and reactive to light. Conjunctivae and EOM are normal.  Neck: Normal range of motion. Neck supple.  No meningismus.  Cardiovascular: Normal rate, regular rhythm, normal heart sounds and intact distal  pulses.  No murmur heard. Pulmonary/Chest: She is in respiratory distress. She has rales.  Increased work of breathing with tachypnea bibasilar crackles.  No wheezing  Abdominal: Soft. There is tenderness. There is no rebound and no guarding.  Obese abdomen, tenderness in the right upper quadrant and epigastrium with voluntary guarding. Exam limited  by obesity and patient's inability to lay flat.   Draining wound to lower pannus as depicted  Musculoskeletal: Normal range of motion. She exhibits edema. She exhibits no tenderness.  Brawny edema to lower extremities and chronic skin changes.  Intact DP pulses  Neurological: She is alert and oriented to person, place, and time. No cranial nerve deficit. She exhibits normal muscle tone. Coordination normal.  No ataxia on finger to nose bilaterally. No pronator drift. 5/5 strength throughout. CN 2-12 intact.Equal grip strength. Sensation intact.   Skin: Skin is warm.  Psychiatric: She has a normal mood and affect. Her behavior is normal.  Nursing note and vitals reviewed.      ED Treatments / Results  Labs (all labs ordered are listed, but only abnormal results are displayed) Labs Reviewed  CBC WITH DIFFERENTIAL/PLATELET - Abnormal; Notable for the following components:      Result Value   WBC 17.8 (*)    RBC 6.32 (*)    Hemoglobin 15.1 (*)    HCT 48.3 (*)    MCV 76.4 (*)    MCH 23.9 (*)    RDW 17.1 (*)    Platelets 549 (*)    Neutro Abs 14.6 (*)    Monocytes Absolute 1.2 (*)    All other components within normal limits  COMPREHENSIVE METABOLIC PANEL - Abnormal; Notable for the following components:   Sodium 132 (*)    Chloride 94 (*)    Glucose, Bld 174 (*)    BUN 44 (*)    Creatinine, Ser 1.85 (*)    GFR calc non Af Amer 25 (*)    GFR calc Af Amer 29 (*)    All other components within normal limits  LIPASE, BLOOD - Abnormal; Notable for the following components:   Lipase 79 (*)    All other components within normal limits   URINALYSIS, ROUTINE W REFLEX MICROSCOPIC - Abnormal; Notable for the following components:   Color, Urine AMBER (*)    APPearance HAZY (*)    Protein, ur 30 (*)    Bacteria, UA RARE (*)    All other components within normal limits  I-STAT CG4 LACTIC ACID, ED - Abnormal; Notable for the following components:   Lactic Acid, Venous 3.38 (*)    All other components within normal limits  I-STAT CG4 LACTIC ACID, ED - Abnormal; Notable for the following components:   Lactic Acid, Venous 2.93 (*)    All other components within normal limits  CULTURE, BLOOD (ROUTINE X 2)  CULTURE, BLOOD (ROUTINE X 2)  URINE CULTURE  BRAIN NATRIURETIC PEPTIDE  TROPONIN I  BLOOD GAS, ARTERIAL    EKG EKG Interpretation  Date/Time:  Thursday June 19 2018 06:25:59 EDT Ventricular Rate:  103 PR Interval:    QRS Duration: 82 QT Interval:  328 QTC Calculation: 430 R Axis:   130 Text Interpretation:  Sinus tachycardia Low voltage, precordial leads Probable right ventricular hypertrophy Borderline T abnormalities, anterior leads Rate faster Confirmed by Ezequiel Essex [phone]) on 06/19/2018 6:35:09 AM   Radiology Dg Chest Portable 1 View  Result Date: 06/19/2018 CLINICAL DATA:  Initial evaluation for acute shortness of breath. EXAM: PORTABLE CHEST 1 VIEW COMPARISON:  Prior radiograph from 03/17/2018. FINDINGS: Cardiomegaly, grossly stable from previous. Mediastinal silhouette within normal limits. Aortic atherosclerosis. Lungs are hypoinflated. Diffuse vascular congestion with interstitial prominence, suggesting diffuse pulmonary interstitial edema. Superimposed bibasilar opacities favored to reflect atelectasis. Infiltrates not excluded, and could be considered in the correct clinical setting. No  obvious large pleural effusion. No pneumothorax. No acute osseus abnormality. IMPRESSION: 1. Cardiomegaly with diffuse vascular congestion and interstitial prominence, suggesting mild pulmonary interstitial edema.  2. Low lung volumes with superimposed bibasilar opacities, likely atelectasis. Electronically Signed   By: Jeannine Boga M.D.   On: 06/19/2018 06:48    Procedures Procedures (including critical care time)  Medications Ordered in ED Medications  ondansetron (ZOFRAN) injection 4 mg (has no administration in time range)  fentaNYL (SUBLIMAZE) injection 25 mcg (has no administration in time range)     Initial Impression / Assessment and Plan / ED Course  I have reviewed the triage vital signs and the nursing notes.  Pertinent labs & imaging results that were available during my care of the patient were reviewed by me and considered in my medical decision making (see chart for details).    Chronically ill female presenting with right-sided abdominal pain that onset last night.  Shortness of breath she states is near baseline and due to her COPD and CHF and sleep apnea. She has crackles at the bases.  Patient given symptom control pain and nausea medications.  She does have bibasilar crackles and is tachypneic but states this is near baseline. chest x-ray shows some vascular congestion.   Patient with elevated lactate and leukocytosis.  Afebrile.  Mild tachycardia.  She is tachypneic and quite short of breath with conversation. X-ray shows vascular congestion.  Patient with SIRs but no fever.  Concern for intra-abdominal infection, has abdominal wall draining wound and cellulitis as well.  Given her pulmonary edema and increased work of breathing already, she is not given aggressive fluid bolus.  Started on IV antibiotics.  Will need CT scan to further evaluate abdomen and as well as gallbladder.  Will need admission.  Patient continues to be dyspneic one hour after arrival.  With increased work of breathing, will place on BiPAP to facilitate CT scan. Patient states that she would want to be intubated only if necessary to save her life. Will continue to hold aggressive large volume IVF  at this time but will attempt small amounts of fluid while continuing bipap.   Dr. Dayna Barker to assume care at shift change.   CRITICAL CARE Performed by: Ezequiel Essex Total critical care time: 45 minutes Critical care time was exclusive of separately billable procedures and treating other patients. Critical care was necessary to treat or prevent imminent or life-threatening deterioration. Critical care was time spent personally by me on the following activities: development of treatment plan with patient and/or surrogate as well as nursing, discussions with consultants, evaluation of patient's response to treatment, examination of patient, obtaining history from patient or surrogate, ordering and performing treatments and interventions, ordering and review of laboratory studies, ordering and review of radiographic studies, pulse oximetry and re-evaluation of patient's condition.    Final Clinical Impressions(s) / ED Diagnoses   Final diagnoses:  None    ED Discharge Orders    None       Mamadou Breon, Annie Main, MD 06/19/18 [phone]

## 2018-06-19 NOTE — ED Notes (Signed)
Pt taken off bi pap per EDP verbal order, placed on 3 l o2 via [place]

## 2018-06-19 NOTE — Progress Notes (Signed)
VASCULAR LAB PRELIMINARY  ARTERIAL  ABI completed: Right: Resting right ankle-brachial index is within normal range. No evidence of significant right lower extremity arterial disease. The right toe-brachial index is abnormal.   Left: Resting left ankle-brachial index is within normal range. No evidence of significant left lower extremity arterial disease. The left toe-brachial index is abnormal.      RIGHT    LEFT    PRESSURE WAVEFORM  PRESSURE WAVEFORM  BRACHIAL 129 Triphasic BRACHIAL 125 Triphasic  DP 84 Monophasic DP 111 Monophasic  PT 129 Biphasic PT 132 Biphasic  GREAT TOE 0 NA GREAT TOE 0 NA    RIGHT LEFT  ABI/TBI 1.00/0.00 1.02/0.00     Rudell Cobb, RVT, RDMS 06/19/2018, 5:13 PM

## 2018-06-19 NOTE — ED Notes (Signed)
CRITICAL VALUE ALERT  Critical Value:  Lactic Acid  Date & Time Notied:  06/19/2018  8:48  Provider Notified: [place]  Orders Received/Actions taken: N/A

## 2018-06-19 NOTE — Consult Note (Addendum)
Hospital Consult    Reason for Consult: Cyanotic feet bilaterally Referring Physician: Dr. Posey Pronto with the hospitalist MRN #:  [id number]  History of Present Illness: This is a 77 y.o. 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  She has been transferred from any pain for further evaluation.  On evaluation earlier she is found to have cyanotic feet.  She states that the feet occasionally look this way.  It is not painful.  She has no sensory or motor dysfunction.  She does not take any blood thinners is never had blood clots that she knows about.  She is limited mobility secondary to her oxygen requirement as well as her size.  She does not have a good prognosis from this mesenteric mass and she seems to understand this.  Past Medical History:  Diagnosis Date  . Anemia   . Arthritis   . Bronchitis 05/2014  . Cellulitis of lower extremity 06/2014   bilateral  . CHF (congestive heart failure) ([place])   . COPD (chronic obstructive pulmonary disease) ([place])   . Diabetes mellitus   . Diverticulitis   . Gallstone   . Hypertension   . MRSA (methicillin resistant Staphylococcus aureus)    has now "tested negative"- no open wounds as of 02-10-15  . Obesity   . On home O2   . Shortness of breath    with exertion   . Sleep apnea    cpap at 10   . Sleep apnea   . Thyroid disease   . Tubular adenoma of colon     Past Surgical History:  Procedure Laterality Date  . ABDOMINAL HYSTERECTOMY  1979  . ABDOMINAL HYSTERECTOMY  1978  . APPENDECTOMY    . APPLICATION OF WOUND VAC  wound vac  . APPLICATION OF WOUND VAC  06/26/2012   Procedure: APPLICATION OF WOUND VAC;  Surgeon: Gayland Curry, MD,FACS;  Location: [facility];  Service: General;;  . BACK SURGERY    . COLON RESECTION  10/09/2011   Procedure: COLON RESECTION;  Surgeon: Gayland Curry,  MD;  Location: [place];  Service: General;  Laterality: N/A;  Colon Resection with colostomy  . COLON RESECTION  06/26/2012   Procedure: COLON RESECTION;  Surgeon: Gayland Curry, MD,FACS;  Location: [facility];  Service: General;  Laterality: N/A;  . ESOPHAGOGASTRODUODENOSCOPY (EGD) WITH PROPOFOL N/A 02/17/2015   Procedure: ESOPHAGOGASTRODUODENOSCOPY (EGD) WITH PROPOFOL;  Surgeon: Jerene Bears, MD;  Location: [facility];  Service: Gastroenterology;  Laterality: N/A;  . jackson pratt    . PROCTOSCOPY  06/26/2012   Procedure: PROCTOSCOPY;  Surgeon: Gayland Curry, MD,FACS;  Location: [facility];  Service: General;  Laterality: N/A;  Rigid Proctoscopy    Allergies  Allergen Reactions  . Adhesive [Tape] Hives and Rash  . Codeine Itching and Other (See Comments)  . Lovenox [Enoxaparin Sodium]     Local rash May 2019 w/ppx dosing Has tolerated SQ heparin in the past  . Vancomycin Itching and Rash  . Zosyn [Piperacillin Sod-Tazobactam So] Itching and Rash    Prior to Admission medications   Medication Sig Start Date End Date Taking? Authorizing Provider  aspirin 81 MG tablet Take 81 mg by mouth at bedtime.    Yes [provider]  budesonide-formoterol (SYMBICORT) 160-4.5 MCG/ACT inhaler Inhale 2 puffs into the lungs 2 (two) times daily.   Yes  [provider]  Calcium Carb-Cholecalciferol (CALCIUM 600 + D PO) Take 2 tablets by mouth daily.   Yes [provider]  diphenhydrAMINE (BENADRYL) 25 mg capsule Take 25 mg by mouth every 6 (six) hours as needed for itching.   Yes [provider]  ipratropium-albuterol (DUONEB) 0.5-2.5 (3) MG/3ML SOLN Take 3 mLs by nebulization every 4 (four) hours as needed (for wheezing or shortness of breath).  01/10/18  Yes [provider]  JANUVIA 100 MG tablet Take 100 mg by mouth daily.  11/11/14  Yes [provider]  levothyroxine (SYNTHROID, LEVOTHROID) 50 MCG tablet Take 50 mcg by mouth daily. 08/04/15  Yes [provider]  metFORMIN (GLUCOPHAGE) 500 MG tablet Take 1,000 mg by mouth 2 (two) times daily with a meal.    Yes [provider]  metolazone (ZAROXOLYN) 2.5 MG tablet Take 1 tablet (2.5 mg total) by mouth as needed. 05/16/18 08/14/18 Yes Bensimhon, Shaune Pascal, MD  montelukast (SINGULAIR) 10 MG tablet Take 10 mg by mouth daily.    Yes [provider]  polyethylene glycol (MIRALAX / GLYCOLAX) packet Take 17 g by mouth daily with lunch.    Yes [provider]  potassium chloride SA (K-DUR,KLOR-CON) 20 MEQ tablet Take 1 tablet (20 mEq total) by mouth daily. 05/16/18  Yes Bensimhon, Shaune Pascal, MD  torsemide (DEMADEX) 20 MG tablet Take 3 tablets (60 mg total) by mouth 2 (two) times daily. 05/27/18 08/25/18 Yes Bensimhon, Shaune Pascal, MD  traMADol (ULTRAM) 50 MG tablet Take 50 mg by mouth every 4 (four) hours as needed (for pain).  01/09/12  Yes [provider]  Wheat Dextrin (BENEFIBER) POWD Take 15 g by mouth daily.    Yes [provider]  betamethasone dipropionate (DIPROLENE) 0.05 % cream  05/31/18   [provider]  doxycycline (VIBRA-TABS) 100 MG tablet Take 1 tablet (100 mg total) by mouth 2 (two) times daily. Take until bottle empty. Take with food. Patient not taking: Reported on 06/19/2018 05/29/18   Georgiana Shore, NP  fluconazole (DIFLUCAN) 150 MG tablet Take 150 mg by mouth daily. TAKING 1 [place]    [provider]  Glucosamine-Chondroit-Vit C-Mn (GLUCOSAMINE 1500 COMPLEX PO) Take 2 tablets by mouth daily.    [provider]  mupirocin cream (BACTROBAN) 2 % Apply to the right buttock wound twice a day. 03/18/18   Johnson, Clanford L, MD  nystatin (MYCOSTATIN/NYSTOP) powder Apply topically 2 (two) times daily. 03/18/18   Murlean Iba, MD    Social History   Socioeconomic History  . Marital status: Married    Spouse name: Not on file  . Number of children: 0  . Years of education: Not on file  . Highest education  level: Not on file  Occupational History  . Occupation: Retired  Scientific laboratory technician  . Financial resource strain: Not on file  . Food insecurity:    Worry: Not on file    Inability: Not on file  . Transportation needs:    Medical: Not on file    Non-medical: Not on file  Tobacco Use  . Smoking status: Former Smoker    Last attempt to quit: 09/24/2010    Years since quitting: 7.7  . Smokeless tobacco: Never Used  Substance and Sexual Activity  . Alcohol use: No    Alcohol/week: 0.0 standard drinks  . Drug use: No  . Sexual activity: Not Currently    Birth control/protection: Surgical  Lifestyle  . Physical  activity:    Days per week: Not on file    Minutes per session: Not on file  . Stress: Not on file  Relationships  . Social connections:    Talks on phone: Not on file    Gets together: Not on file    Attends religious service: Not on file    Active member of club or organization: Not on file    Attends meetings of clubs or organizations: Not on file    Relationship status: Not on file  . Intimate partner violence:    Fear of current or ex partner: Not on file    Emotionally abused: Not on file    Physically abused: Not on file    Forced sexual activity: Not on file  Other Topics Concern  . Not on file  Social History Narrative  . Not on file     Family History  Problem Relation Age of Onset  . Heart disease Father   . Diabetes Father   . Hypertension Father   . Diabetes Mother   . Hypertension Mother   . Heart disease Brother   . Diabetes Brother   . Diabetes Sister   . Colon cancer Neg Hx   . Colon polyps Neg Hx   . Esophageal cancer Neg Hx   . Kidney disease Neg Hx   . Gallbladder disease Neg Hx     ROS: [x]  Positive   [ ]  Negative   [ ]  All sytems reviewed and are negative  Cardiovascular: []  chest pain/pressure []  palpitations [x]  SOB lying flat []  DOE []  pain in legs while walking []  pain in legs at rest []  pain in legs at night []  non-healing  ulcers []  hx of DVT [x]  swelling in legs  Pulmonary: []  productive cough []  asthma/wheezing []  home O2  Neurologic: [x]  weakness in []  arms []  legs []  numbness in []  arms []  legs []  hx of CVA []  mini stroke [] difficulty speaking or slurred speech []  temporary loss of vision in one eye []  dizziness  Hematologic: []  hx of cancer []  bleeding problems []  problems with blood clotting easily  Endocrine:   []  diabetes []  thyroid disease  GI []  vomiting blood []  blood in stool  GU: []  CKD/renal failure []  HD--[]  M/W/F or []  T/T/S []  burning with urination []  blood in urine  Psychiatric: []  anxiety []  depression  Musculoskeletal: []  arthritis []  joint pain  Integumentary: [x]  rashes []  ulcers  Constitutional: []  fever []  chills   Physical Examination  Vitals:   06/19/18 1455 06/19/18 1521  BP: (!) 89/72 105/68  Pulse: 92   Resp: (!) 22   Temp: 97.6 F (36.4 C)   SpO2: 96%    Body mass index is 44.93 kg/m.  General:  WDWN in NAD Pulmonary: Currently nonlabored on oxygen Cardiac: Palpable radial pulses bilaterally as well as palpable popliteal pulses bilaterally, she has very weak posterior tibial signals bilaterally Abdomen: Her abdomen is soft minimally tender, she is obese, I cannot palpate any masses Extremities: Bilateral feet are cyanotic and cool but she does have good capillary refill less than 2 seconds bilaterally and her feet are sensorimotor intact Neurologic: A&O X 3; Appropriate Affect ; SENSATION: normal; MOTOR FUNCTION:  moving all extremities equally. Speech is fluent/normal   CBC    Component Value Date/Time   WBC 17.8 (H) 06/19/2018 0629   RBC 6.32 (H) 06/19/2018 0629   HGB 15.1 (H) 06/19/2018 0629   HCT 48.3 (H) 06/19/2018 4166  PLT 549 (H) 06/19/2018 0629   MCV 76.4 (L) 06/19/2018 0629   MCH 23.9 (L) 06/19/2018 0629   MCHC 31.3 06/19/2018 0629   RDW 17.1 (H) 06/19/2018 0629   LYMPHSABS 1.9 06/19/2018 0629   MONOABS 1.2 (H)  06/19/2018 0629   EOSABS 0.1 06/19/2018 0629   BASOSABS 0.1 06/19/2018 0629    BMET    Component Value Date/Time   NA 132 (L) 06/19/2018 0629   K 5.0 06/19/2018 0629   CL 94 (L) 06/19/2018 0629   CO2 25 06/19/2018 0629   GLUCOSE 174 (H) 06/19/2018 0629   BUN 44 (H) 06/19/2018 0629   CREATININE 1.85 (H) 06/19/2018 0629   CALCIUM 9.3 06/19/2018 0629   GFRNONAA 25 (L) 06/19/2018 0629   GFRAA 29 (L) 06/19/2018 0629    COAGS: Lab Results  Component Value Date   INR 1.02 10/22/2011     Non-Invasive Imaging:   Non contrast CT IMPRESSION: 1. There is a large solid and cystic mass identified within the right lower quadrant of the abdomen. Primary concern is for malignancy. Additionally, since the previous exam there has been interval development of diffuse peritoneal nodularity, and infiltration of the omentum, worrisome for diffuse peritoneal carcinomatosis. 2. High attenuation material extending into the right retroperitoneum is identified, concerning for retroperitoneal hematoma. 3. There is marked cellulitic changes involving the patient's pannus. 4. Periumbilical hernia is identified with surrounding skin thickening and ulceration. Cannot exclude underlying subcutaneous abscess. 5. Gallstone.   ASSESSMENT/PLAN: This is a 77 y.o. female with bilateral cyanotic feet.  Formal ABIs are pending being performed when I was evaluating.  As she has no sensorimotor deficits has palpable popliteal pulses bilaterally she likely has tibial disease only with the overall picture being grim would not recommend any intervention.  Brandon C. Donzetta Matters, MD [facility] Office: [phone] Pager: [phone]  Addendum: ABI's reviewed with negligible toe pressures but as patient is asymptomatic we will be available as needed.   Servando Snare

## 2018-06-19 NOTE — Progress Notes (Signed)
Patient on CPAP with 02 flow set at 15L. Patient's 02 saturation 81%. Placed patient on 100% NRB. Patient is more comfortable and 02 saturations 98%. RT will continue to monitor and wean as tolerated.

## 2018-06-19 NOTE — Progress Notes (Signed)
Attempted to place foley catheter twice. Patient unable to tolerate laying flat. Unable to place catheter. Dr. Posey Pronto notified. Will place purwick for now until patient gets more comfortable and can lay flat.

## 2018-06-19 NOTE — H&P (Signed)
History and Physical  [name]:[id number] DOB: 08-Mar-1941 DOA: 06/19/2018   PCP: Sharilyn Sites, MD   Patient coming from: Home  Chief Complaint: abdominal pain  HPI:  [name] is a 77 y.o. female with medical history of diastolic CHF/cor pulmonale, OHS/OSA, morbid obesity, CKD stage III, diabetes mellitus type 2, COPD, chronic respiratory failure on 3 L at home presenting with abdominal pain that began on 06/18/2018.  The patient denies any fevers, chills, nausea, vomiting, diarrhea, hematochezia, melena.  She states that the abdominal pain was in the right upper quadrant and epigastric area as well as right lower quadrant and has been constant and severe in intensity.  She denies any recent injury or falls or new medications.  She denies any dysuria, hematuria, or drainage from her intra-abdominal wounds.  The patient has had a history of perforated colon diverticulitis with partial colectomy and reversal of her colostomy by Dr. Greer Pickerel.  The patient was noted to be short of breath upon arrival and was initially placed on BiPAP.  The patient endorsed some indiscretion with fluid intake in the last 1 to 2 days.  She endorsed compliance with her medications and diet.  She states that her leg edema has actually been a little bit better than usual, but she states that she has to sleep in a recliner chronically.  In the emergency department, the patient was afebrile and hemodynamically stable saturating 95% 3 L.  The patient was initially placed on BiPAP.  She was given furosemide with improvement of respiratory status and weaned off BiPAP.  She is currently stable on 3 L at time of my evaluation.  WBC was 17.8 with unremarkable LFTs.  Serum creatinine was 1.5 with sodium 132, potassium 5.0.  Lactic acid was 2.93.  CT of the abdomen and pelvis showed a gallbladder neck stone without ductal dilatation.  There was also a large solid cystic mass in the right lower quadrant with diffuse  peritoneal nodularity.  Assessment/Plan: Gallbladder neck stone/cholecystitis -continue ceftriaxone and metronidazole (rash with zosyn) -discussed with [facility] [place] [facility], Dr. Grandville Silos, who will see in consult after transfer to [place]  RLQ Mass/Omental Caking -will need biopsy -defer to general surgery  Acute on chronic diastolic CHF -Exacerbation is mild -Patient endorse indiscretion with fluid intake the last 1 to 2 days -Initially placed on BiPAP, but weaned back to baseline 3 L after IV furosemide -Daily weights -01/18/2018 echo EF 65-70%, no WMA, RV overload -as po is npo--continue lasix 60 mg IV bid  Chronic respiratory failure with hypoxia -Normally on 3 L at home -Multifactorial including CHF, OSH/OSA, COPD  COPD -30-pack-year tobacco history -Continue duo nebs  Diabetes mellitus type 2 -Check hemoglobin A1c -NovoLog sliding scale -Holding metformin and Januvia  Hypothyroidism -Start IV Synthroid as the patient is n.p.o.  Morbid obesity -BMI 44.93 -lifestyle modification        Past Medical History:  Diagnosis Date  . Anemia   . Arthritis   . Bronchitis 05/2014  . Cellulitis of lower extremity 06/2014   bilateral  . CHF (congestive heart failure) ([place])   . COPD (chronic obstructive pulmonary disease) ([place])   . Diabetes mellitus   . Diverticulitis   . Gallstone   . Hypertension   . MRSA (methicillin resistant Staphylococcus aureus)    has now "tested negative"- no open wounds as of 02-10-15  . Obesity   . On home O2   . Shortness of breath  with exertion   . Sleep apnea    cpap at 10   . Sleep apnea   . Thyroid disease   . Tubular adenoma of colon    Past Surgical History:  Procedure Laterality Date  . ABDOMINAL HYSTERECTOMY  1979  . ABDOMINAL HYSTERECTOMY  1978  . APPENDECTOMY    . APPLICATION OF WOUND VAC  wound vac  . APPLICATION OF WOUND VAC  06/26/2012   Procedure: APPLICATION OF WOUND VAC;  Surgeon: Gayland Curry,  MD,FACS;  Location: [facility];  Service: General;;  . BACK SURGERY    . COLON RESECTION  10/09/2011   Procedure: COLON RESECTION;  Surgeon: Gayland Curry, MD;  Location: [facility];  Service: General;  Laterality: N/A;  Colon Resection with colostomy  . COLON RESECTION  06/26/2012   Procedure: COLON RESECTION;  Surgeon: Gayland Curry, MD,FACS;  Location: [facility];  Service: General;  Laterality: N/A;  . ESOPHAGOGASTRODUODENOSCOPY (EGD) WITH PROPOFOL N/A 02/17/2015   Procedure: ESOPHAGOGASTRODUODENOSCOPY (EGD) WITH PROPOFOL;  Surgeon: Jerene Bears, MD;  Location: [facility];  Service: Gastroenterology;  Laterality: N/A;  . jackson pratt    . PROCTOSCOPY  06/26/2012   Procedure: PROCTOSCOPY;  Surgeon: Gayland Curry, MD,FACS;  Location: [facility];  Service: General;  Laterality: N/A;  Rigid Proctoscopy   Social History:  reports that she quit smoking about 7 years ago. She has never used smokeless tobacco. She reports that she does not drink alcohol or use drugs.   Family History  Problem Relation Age of Onset  . Heart disease Father   . Diabetes Father   . Hypertension Father   . Diabetes Mother   . Hypertension Mother   . Heart disease Brother   . Diabetes Brother   . Diabetes Sister   . Colon cancer Neg Hx   . Colon polyps Neg Hx   . Esophageal cancer Neg Hx   . Kidney disease Neg Hx   . Gallbladder disease Neg Hx      Allergies  Allergen Reactions  . Adhesive [Tape] Hives and Rash  . Codeine Itching and Other (See Comments)  . Lovenox [Enoxaparin Sodium]     Local rash May 2019 w/ppx dosing  . Vancomycin Itching and Rash  . Zosyn [Piperacillin Sod-Tazobactam So] Itching and Rash     Prior to Admission medications   Medication Sig Start Date End Date Taking? Authorizing Provider  aspirin 81 MG tablet Take 81 mg by mouth at bedtime.    Yes [provider]  budesonide-formoterol (SYMBICORT) 160-4.5 MCG/ACT inhaler Inhale 2 puffs into the lungs 2 (two) times daily.   Yes  [provider]  Calcium Carb-Cholecalciferol (CALCIUM 600 + D PO) Take 2 tablets by mouth daily.   Yes [provider]  diphenhydrAMINE (BENADRYL) 25 mg capsule Take 25 mg by mouth every 6 (six) hours as needed for itching.   Yes [provider]  ipratropium-albuterol (DUONEB) 0.5-2.5 (3) MG/3ML SOLN Take 3 mLs by nebulization every 4 (four) hours as needed (for wheezing or shortness of breath).  01/10/18  Yes [provider]  JANUVIA 100 MG tablet Take 100 mg by mouth daily.  11/11/14  Yes [provider]  levothyroxine (SYNTHROID, LEVOTHROID) 50 MCG tablet Take 50 mcg by mouth daily. 08/04/15  Yes [provider]  metFORMIN (GLUCOPHAGE) 500 MG tablet Take 1,000 mg by mouth 2 (two) times daily with a meal.    Yes [provider]  metolazone (ZAROXOLYN) 2.5 MG  tablet Take 1 tablet (2.5 mg total) by mouth as needed. 05/16/18 08/14/18 Yes Bensimhon, Shaune Pascal, MD  montelukast (SINGULAIR) 10 MG tablet Take 10 mg by mouth daily.    Yes [provider]  polyethylene glycol (MIRALAX / GLYCOLAX) packet Take 17 g by mouth daily with lunch.    Yes [provider]  potassium chloride SA (K-DUR,KLOR-CON) 20 MEQ tablet Take 1 tablet (20 mEq total) by mouth daily. 05/16/18  Yes Bensimhon, Shaune Pascal, MD  torsemide (DEMADEX) 20 MG tablet Take 3 tablets (60 mg total) by mouth 2 (two) times daily. 05/27/18 08/25/18 Yes Bensimhon, Shaune Pascal, MD  traMADol (ULTRAM) 50 MG tablet Take 50 mg by mouth every 4 (four) hours as needed (for pain).  01/09/12  Yes [provider]  Wheat Dextrin (BENEFIBER) POWD Take 15 g by mouth daily.    Yes [provider]  betamethasone dipropionate (DIPROLENE) 0.05 % cream  05/31/18   [provider]  doxycycline (VIBRA-TABS) 100 MG tablet Take 1 tablet (100 mg total) by mouth 2 (two) times daily. Take until bottle empty. Take with food. Patient not taking: Reported on 06/19/2018 05/29/18   Georgiana Shore, NP  fluconazole (DIFLUCAN) 150 MG tablet Take 150 mg by mouth daily. TAKING 1 [facility]    [provider]  Glucosamine-Chondroit-Vit C-Mn (GLUCOSAMINE 1500 COMPLEX PO) Take 2 tablets by mouth daily.    [provider]  mupirocin cream (BACTROBAN) 2 % Apply to the right buttock wound twice a day. 03/18/18   Johnson, Clanford L, MD  nystatin (MYCOSTATIN/NYSTOP) powder Apply topically 2 (two) times daily. 03/18/18   Murlean Iba, MD    Review of Systems:  Constitutional:  No weight loss, night sweats, Fevers, chills, fatigue.  Head&Eyes: No headache.  No vision loss.  No eye pain or scotoma ENT:  No Difficulty swallowing,Tooth/dental problems,Sore throat,  No ear ache, post nasal drip,  Cardio-vascular:  No chest pain, Orthopnea, PND, swelling in lower extremities,  dizziness, palpitations  GI:  No   nausea, vomiting, diarrhea, loss of appetite, hematochezia, melena, heartburn, indigestion, Resp:   No cough. No coughing up of blood .No wheezing.No chest wall deformity  Skin:  no rash or lesions.  GU:  no dysuria, change in color of urine, no urgency or frequency. No flank pain.  Musculoskeletal:  No joint pain or swelling. No decreased range of motion. No back pain.  Psych:  No change in mood or affect. No depression or anxiety. Neurologic: No headache, no dysesthesia, no focal weakness, no vision loss. No syncope  Physical Exam: Vitals:   06/19/18 1100 06/19/18 1115 06/19/18 1130 06/19/18 1148  BP:   115/74   Pulse: 97 98 94 96  Resp: (!) 25 (!) 25 (!) 27 (!) 21  Temp:      TempSrc:      SpO2: 98% 97% 91% 96%  Weight:      Height:       General:  A&O x 3, NAD, nontoxic, pleasant/cooperative Head/Eye: No conjunctival hemorrhage, no icterus, [place]/AT, No nystagmus ENT:  No icterus,  No thrush, good dentition, no pharyngeal exudate Neck:  No masses, no lymphadenpathy, no bruits CV:  RRR, no rub, no gallop, no S3 Lung: Bibasilar  crackles.  Diminished breath sounds at the bases but no wheezing. Abdomen: soft/RUQ, RLQ, epigastric pain, +BS, nondistended, no peritoneal signs Ext: No cyanosis, No rashes, No petechiae, No lymphangitis, No edema Neuro: CNII-XII intact, strength 4/5 in bilateral upper  and lower extremities, no dysmetria  Labs on Admission:  Basic Metabolic Panel: Recent Labs  Lab 06/19/18 0629  NA 132*  K 5.0  CL 94*  CO2 25  GLUCOSE 174*  BUN 44*  CREATININE 1.85*  CALCIUM 9.3   Liver Function Tests: Recent Labs  Lab 06/19/18 0629  AST 19  ALT 12  ALKPHOS 102  BILITOT 1.1  PROT 7.5  ALBUMIN 3.5   Recent Labs  Lab 06/19/18 0629  LIPASE 79*   No results for input(s): AMMONIA in the last 168 hours. CBC: Recent Labs  Lab 06/19/18 0629  WBC 17.8*  NEUTROABS 14.6*  HGB 15.1*  HCT 48.3*  MCV 76.4*  PLT 549*   Coagulation Profile: No results for input(s): INR, PROTIME in the last 168 hours. Cardiac Enzymes: Recent Labs  Lab 06/19/18 0629  TROPONINI <0.03   BNP: Invalid input(s): POCBNP CBG: No results for input(s): GLUCAP in the last 168 hours. Urine analysis:    Component Value Date/Time   COLORURINE AMBER (A) 06/19/2018 0607   APPEARANCEUR HAZY (A) 06/19/2018 0607   LABSPEC 1.023 06/19/2018 0607   PHURINE 5.0 06/19/2018 0607   GLUCOSEU NEGATIVE 06/19/2018 0607   HGBUR NEGATIVE 06/19/2018 0607   BILIRUBINUR NEGATIVE 06/19/2018 0607   KETONESUR NEGATIVE 06/19/2018 0607   PROTEINUR 30 (A) 06/19/2018 0607   UROBILINOGEN 0.2 06/25/2014 1823   NITRITE NEGATIVE 06/19/2018 0607   LEUKOCYTESUR NEGATIVE 06/19/2018 0607   Sepsis Labs: @LABRCNTIP (procalcitonin:4,lacticidven:4) ) Recent Results (from the past 240 hour(s))  Blood culture (routine x 2)     Status: None (Preliminary result)   Collection Time: 06/19/18  7:30 AM  Result Value Ref Range Status   Specimen Description BLOOD RIGHT HAND  Final   Special Requests   Final    BOTTLES DRAWN AEROBIC AND ANAEROBIC  Blood Culture adequate volume Performed at [facility], [address], [place], [place] [address]    Culture PENDING  Incomplete   Report Status PENDING  Incomplete  Blood culture (routine x 2)     Status: None (Preliminary result)   Collection Time: 06/19/18  7:39 AM  Result Value Ref Range Status   Specimen Description BLOOD  Final   Special Requests   Final    BOTTLES DRAWN AEROBIC AND ANAEROBIC Blood Culture adequate volume Performed at [facility], [address], [place],  [address]    Culture PENDING  Incomplete   Report Status PENDING  Incomplete     Radiological Exams on Admission: Ct Abdomen Pelvis Wo Contrast  Result Date: 06/19/2018 CLINICAL DATA:  Abdominal swelling and right-sided pain. EXAM: CT ABDOMEN AND PELVIS WITHOUT CONTRAST TECHNIQUE: Multidetector CT imaging of the abdomen and pelvis was performed following the standard protocol without IV contrast. COMPARISON:  01/15/2013 FINDINGS: Lower chest: Small pleural effusions are identified with overlying atelectasis/consolidation. Hepatobiliary: No focal liver abnormality. Stone within the gallbladder neck measures 1.1 cm, image 26/2. No biliary ductal dilatation. Pancreas: Multiple calcifications are identified throughout the pancreas compatible with chronic pancreatitis. No inflammation or mass noted. Spleen: Spleen is unremarkable. Adrenals/Urinary Tract: Normal appearance of the adrenal glands. Several hyperdense lesions are identified within the left kidney. The largest is in the upper pole measuring 3.5 cm and appears new from comparison exam. No hydronephrosis identified bilaterally.Urinary bladder appears collapsed. Stomach/Bowel: The stomach appears nondilated. Small and large bowel loops have a normal course and caliber. Vascular/Lymphatic: Aortic atherosclerosis. No aneurysm. No retroperitoneal adenopathy. No iliac adenopathy. Right inguinal lymph node is enlarged measuring 1.4 cm.  Adjacent lymph node measures  1.2 cm. Reproductive: The uterus appears surgically absent. Is the ovaries are not confidently identified on this exam. Other: There is a moderate volume of ascites within the peritoneal cavity which within the limitations of unenhanced technique appears at least partially loculated. Fluid extends scratch set perihepatic and perisplenic ascites is noted. There is diffuse peritoneal nodularity identified within the abdomen and pelvis as well as soft tissue infiltration throughout the omentum compatible with omental caking. This is new compared with the previous exam. Within the right lower quadrant of the abdomen there is a large solid and cystic mass measuring 16.8 x 11.7 by 16.8 cm. There is high attenuation material extending into the right retroperitoneum worrisome for hematoma. There is a left lower quadrant hernia containing low-attenuation fluid measuring approximately 4 cm, image 81/2. Periumbilical hernia, eccentric to the left, is also noted containing fat, fluid and soft tissue. There is surrounding marked skin thickening and ulceration identified. Musculoskeletal: There is marked skin thickening overlying the pannus. Diffuse subcutaneous fat stranding throughout the pannus is also noted compatible with cellulitis. IMPRESSION: 1. There is a large solid and cystic mass identified within the right lower quadrant of the abdomen. Primary concern is for malignancy. Additionally, since the previous exam there has been interval development of diffuse peritoneal nodularity, and infiltration of the omentum, worrisome for diffuse peritoneal carcinomatosis. 2. High attenuation material extending into the right retroperitoneum is identified, concerning for retroperitoneal hematoma. 3. There is marked cellulitic changes involving the patient's pannus. 4. Periumbilical hernia is identified with surrounding skin thickening and ulceration. Cannot exclude underlying subcutaneous abscess. 5. Gallstone. Electronically Signed    By: Kerby Moors M.D.   On: 06/19/2018 10:19   Dg Chest Portable 1 View  Result Date: 06/19/2018 CLINICAL DATA:  Initial evaluation for acute shortness of breath. EXAM: PORTABLE CHEST 1 VIEW COMPARISON:  Prior radiograph from 03/17/2018. FINDINGS: Cardiomegaly, grossly stable from previous. Mediastinal silhouette within normal limits. Aortic atherosclerosis. Lungs are hypoinflated. Diffuse vascular congestion with interstitial prominence, suggesting diffuse pulmonary interstitial edema. Superimposed bibasilar opacities favored to reflect atelectasis. Infiltrates not excluded, and could be considered in the correct clinical setting. No obvious large pleural effusion. No pneumothorax. No acute osseus abnormality. IMPRESSION: 1. Cardiomegaly with diffuse vascular congestion and interstitial prominence, suggesting mild pulmonary interstitial edema. 2. Low lung volumes with superimposed bibasilar opacities, likely atelectasis. Electronically Signed   By: Jeannine Boga M.D.   On: 06/19/2018 06:48    EKG: Independently reviewed. Sinus, nonspecific T wave changes    Time spent:70 minutes Code Status:   DNR--confirmed with patient and spouse Family Communication:  Spouse at bedside Disposition Plan: expect several day hospitalization Consults called: [facility] DVT Prophylaxis: [place] Heparin     Orson Eva, DO  [facility] Pager [phone]  If 7PM-7AM, please contact night-coverage [email or web address] Password [facility] 06/19/2018, 12:16 PM

## 2018-06-19 NOTE — Progress Notes (Signed)
[facility] Progress Note  Patient: [name] VOJ:[id number]   PCP: Sharilyn Sites, MD DOB: 1940-11-26   DOA: 06/19/2018   DOS: 06/19/2018   Date of Service: the patient was seen and examined on 06/19/2018  Brief hospital course: Pt. with PMH of type II DM, chronic kidney disease stage III, COPD, chronic respiratory failure on 3 L of oxygen, morbid obesity, OHS OSA, cor pulmonale, chronic diastolic CHF; admitted on 06/19/2018, presented with complaint of abdominal pain, was found to have large abdominal mass with omental caking and ascites as well as retroperitoneal hematoma. Currently further plan is continue discussion of goals of care.  Subjective: Patient reports 8 out of 10 abdominal pain.  On transfer from any pain was hypotensive.  No nausea no vomiting.  Reports constipation.  Her breathing is high because of the pain.  She denies having any pain in her legs.  She mentions that her legs goes blue every now and then.  Has not voided since Lasix in ER.  No cough no chest pain.  Assessment and Plan: 1.  Large right lower quadrant mass. Presented with abdominal pain, CT abdomen pelvis without contrast shows a large solid and cystic mass in the right lower quadrant. Concerning for malignancy. There is also peritoneal nodularity as well as infiltration of omentum with omental caking, involvement of left kidney with hyperdense lesions. Patient is in severe pain right now. General surgery consulted highly appreciate their input. Patient is not a candidate for surgery. The best case scenario for the patient would be to attempt a diagnostic paracentesis to get a diagnosis. Even after getting the diagnosis patient would probably may not be a candidate for any chemoradiation as she is unable to lay down flat at her baseline due to her OSA CHF. Recommended complete comfort with palliative care approach for the patient. Palliative care consult for further assistance.  2.  Sirs. With  leukocytosis, tachycardia tachypnea meeting criteria for Sirs although infection less likely. Given that there is also concern for abdominal cellulitis as well as intra-abdominal infection antibiotics were changed to IV doxycycline and IV meropenem. Patient allergic to vancomycin and Zosyn. Monitor cultures.  3.  Retroperitoneal hematoma. CT scan also shows high attenuation material extending into right retroperitoneum concerning for hematoma. Discontinue DVT prophylaxis as well as aspirin. H&H at present relatively stable.  Monitor.  4.  Chronic respiratory failure. COPD. OSA on CPAP. Morbid obesity. Chronic diastolic CHF-cor pulmonale. Presented with abdominal pain and had shortness of breath.  Initially was placed on BiPAP to get CT scan done.  Currently reports that her pain is making her breathing worse. Unable to lie down flat at her baseline. X-ray and shows no significant edema. Clinically appears euvolemic. Discontinue Lasix. Monitor closely.  5.  Cyanotic toes. Patient has bilateral cyanotic toes with loss of peripheral pulse. Vascular surgery consulted as well as ABI performed. ABI appears to be normal although patient does not have any significant circulation in bilateral toes. Currently not a candidate for any surgical intervention from vascular surgery as well. Monitor.  6. Goals of care discussion. 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 Patient's prognosis is already poor with her comorbidities and with addition of the new findings prognosis becomes even more guarded. Patient's pain is currently uncontrolled despite  1 mg of IV Dilaudid and 25 mics of fentanyl. Once the patient is  getting appropriate medication for pain control I suspect that her OSA as well as respiratory failure will get worse. Currently patient is not a surgical candidate and going forward does not appear to be a candidate for any intervention for from oncology side. Consulted palliative care, I highly recommended family to consider complete comfort.  They verbalized understanding.  We will follow-up with palliative care.   Diet: N.p.o. for now DVT Prophylaxis: mechanical compression device  Advance goals of care discussion: DNR/DNI  Family Communication: family was present at bedside, at the time of interview. The pt provided permission to discuss medical plan with the family. Opportunity was given to ask question and all questions were answered satisfactorily.   Disposition:  Discharge to be determined.  Consultants: I personally discussed the case with general surgery, vascular surgery, Palliative care.  Procedures: [place] ABI  Scheduled Meds: . insulin aspart  0-9 Units Subcutaneous Q4H  . ipratropium-albuterol  3 mL Nebulization Q6H  . [START ON 06/20/2018] levothyroxine  25 mcg Intravenous QAC breakfast   Continuous Infusions: . doxycycline (VIBRAMYCIN) IV    . meropenem (MERREM) IV 200 mL/hr at 06/19/18 1700   PRN Meds: acetaminophen **OR** acetaminophen, fentaNYL (SUBLIMAZE) injection, ondansetron **OR** ondansetron (ZOFRAN) IV, oxyCODONE Antibiotics: Anti-infectives (From admission, onward)   Start     Dose/Rate Route Frequency Ordered Stop   06/19/18 1600  cefTRIAXone (ROCEPHIN) 2 g in sodium chloride 0.9 % 100 mL IVPB  Status:  Discontinued     2 g 200 mL/hr over 30 Minutes Intravenous Every 24 hours 06/19/18 1513 06/19/18 1531   06/19/18 1600  metroNIDAZOLE (FLAGYL) IVPB 500 mg  Status:  Discontinued     500 mg 100 mL/hr over 60 Minutes Intravenous Every 8 hours 06/19/18 1513 06/19/18 1531   06/19/18 1545  doxycycline (VIBRAMYCIN) 100 mg in sodium chloride 0.9 % 250 mL IVPB      100 mg 125 mL/hr over 120 Minutes Intravenous Every 12 hours 06/19/18 1533     06/19/18 1545  meropenem (MERREM) 1 g in sodium chloride 0.9 % 100 mL IVPB     1 g 200 mL/hr over 30 Minutes Intravenous Every 12 hours 06/19/18 1541     06/19/18 0715  cefTRIAXone (ROCEPHIN) 1 g in sodium chloride 0.9 % 100 mL IVPB     1 g 200 mL/hr over 30 Minutes Intravenous  Once 06/19/18 0711 06/19/18 0849   06/19/18 0715  metroNIDAZOLE (FLAGYL) IVPB 500 mg     500 mg 100 mL/hr over 60 Minutes Intravenous  Once 06/19/18 0711 06/19/18 1028       Objective: Physical Exam: Vitals:   06/19/18 1330 06/19/18 1335 06/19/18 1455 06/19/18 1521  BP:   (!) 89/72 105/68  Pulse:  94 92   Resp: (!) 25 (!) 27 (!) 22   Temp:   97.6 F (36.4 C)   TempSrc:   Oral   SpO2:  91% 96%   Weight:      Height:        Intake/Output Summary (Last 24 hours) at 06/19/2018 1804 Last data filed at 06/19/2018 1700 Gross per 24 hour  Intake 17.89 ml  Output -  Net 17.89 ml   Filed Weights   06/19/18 0554  Weight: 122.5 kg   General: Alert, Awake and Oriented to Time, Place and Person. Appear in severe distress, affect flat Eyes: PERRL, Conjunctiva normal ENT: Oral Mucosa clear moist. Neck:  difficult to assess  JVD, no Abnormal Mass Or lumps Cardiovascular: S1 and S2 Present, no Murmur, Peripheral Pulses difficult to assess  Respiratory: increased respiratory effort, Bilateral Air entry equal and Decreased, no use of accessory muscle, bilateral  Crackles, no wheezes Abdomen: Bowel Sound present, Soft and severe diffuse tenderness, no hernia Skin: abdominal wall redness, bilateral leg purple blue discoloration, no Rash, no induration Extremities: bilateral  Pedal edema, no calf tenderness Neurologic: Grossly no focal neuro deficit. Bilaterally Equal motor strength  Data Reviewed: CBC: Recent Labs  Lab 06/19/18 0629 06/19/18 1658  WBC 17.8* 20.6*  NEUTROABS 14.6* 17.3*  HGB 15.1* 14.4  HCT 48.3* 48.6*  MCV 76.4*  77.5*  PLT 549* 993*   Basic Metabolic Panel: Recent Labs  Lab 06/19/18 0629 06/19/18 1658  NA 132* 136  K 5.0 5.7*  CL 94* 95*  CO2 25 25  GLUCOSE 174* 175*  BUN 44* 45*  CREATININE 1.85* 2.49*  CALCIUM 9.3 9.3    Liver Function Tests: Recent Labs  Lab 06/19/18 0629 06/19/18 1658  AST 19 18  ALT 12 11  ALKPHOS 102 123  BILITOT 1.1 1.0  PROT 7.5 5.7*  ALBUMIN 3.5 3.3*   Recent Labs  Lab 06/19/18 0629  LIPASE 79*   No results for input(s): AMMONIA in the last 168 hours. Coagulation Profile: Recent Labs  Lab 06/19/18 1658  INR 1.07   Cardiac Enzymes: Recent Labs  Lab 06/19/18 0629 06/19/18 1658  CKTOTAL  --  26*  TROPONINI <0.03  --    BNP (last 3 results) No results for input(s): PROBNP in the last 8760 hours. CBG: Recent Labs  Lab 06/19/18 1635  GLUCAP 157*   Studies: Ct Abdomen Pelvis Wo Contrast  Result Date: 06/19/2018 CLINICAL DATA:  Abdominal swelling and right-sided pain. EXAM: CT ABDOMEN AND PELVIS WITHOUT CONTRAST TECHNIQUE: Multidetector CT imaging of the abdomen and pelvis was performed following the standard protocol without IV contrast. COMPARISON:  01/15/2013 FINDINGS: Lower chest: Small pleural effusions are identified with overlying atelectasis/consolidation. Hepatobiliary: No focal liver abnormality. Stone within the gallbladder neck measures 1.1 cm, image 26/2. No biliary ductal dilatation. Pancreas: Multiple calcifications are identified throughout the pancreas compatible with chronic pancreatitis. No inflammation or mass noted. Spleen: Spleen is unremarkable. Adrenals/Urinary Tract: Normal appearance of the adrenal glands. Several hyperdense lesions are identified within the left kidney. The largest is in the upper pole measuring 3.5 cm and appears new from comparison exam. No hydronephrosis identified bilaterally.Urinary bladder appears collapsed. Stomach/Bowel: The stomach appears nondilated. Small and large bowel loops have a normal  course and caliber. Vascular/Lymphatic: Aortic atherosclerosis. No aneurysm. No retroperitoneal adenopathy. No iliac adenopathy. Right inguinal lymph node is enlarged measuring 1.4 cm. Adjacent lymph node measures 1.2 cm. Reproductive: The uterus appears surgically absent. Is the ovaries are not confidently identified on this exam. Other: There is a moderate volume of ascites within the peritoneal cavity which within the limitations of unenhanced technique appears at least partially loculated. Fluid extends scratch set perihepatic and perisplenic ascites is noted. There is diffuse peritoneal nodularity identified within the abdomen and pelvis as well as soft tissue infiltration throughout the omentum compatible with omental caking. This is new compared with the previous exam. Within the right lower quadrant of the abdomen there is a large solid and cystic mass measuring 16.8 x 11.7 by 16.8 cm. There is high attenuation material extending into the right retroperitoneum worrisome for hematoma. There is a left lower quadrant hernia containing low-attenuation fluid measuring  approximately 4 cm, image 81/2. Periumbilical hernia, eccentric to the left, is also noted containing fat, fluid and soft tissue. There is surrounding marked skin thickening and ulceration identified. Musculoskeletal: There is marked skin thickening overlying the pannus. Diffuse subcutaneous fat stranding throughout the pannus is also noted compatible with cellulitis. IMPRESSION: 1. There is a large solid and cystic mass identified within the right lower quadrant of the abdomen. Primary concern is for malignancy. Additionally, since the previous exam there has been interval development of diffuse peritoneal nodularity, and infiltration of the omentum, worrisome for diffuse peritoneal carcinomatosis. 2. High attenuation material extending into the right retroperitoneum is identified, concerning for retroperitoneal hematoma. 3. There is marked  cellulitic changes involving the patient's pannus. 4. Periumbilical hernia is identified with surrounding skin thickening and ulceration. Cannot exclude underlying subcutaneous abscess. 5. Gallstone. Electronically Signed   By: Kerby Moors M.D.   On: 06/19/2018 10:19   Dg Chest Portable 1 View  Result Date: 06/19/2018 CLINICAL DATA:  Initial evaluation for acute shortness of breath. EXAM: PORTABLE CHEST 1 VIEW COMPARISON:  Prior radiograph from 03/17/2018. FINDINGS: Cardiomegaly, grossly stable from previous. Mediastinal silhouette within normal limits. Aortic atherosclerosis. Lungs are hypoinflated. Diffuse vascular congestion with interstitial prominence, suggesting diffuse pulmonary interstitial edema. Superimposed bibasilar opacities favored to reflect atelectasis. Infiltrates not excluded, and could be considered in the correct clinical setting. No obvious large pleural effusion. No pneumothorax. No acute osseus abnormality. IMPRESSION: 1. Cardiomegaly with diffuse vascular congestion and interstitial prominence, suggesting mild pulmonary interstitial edema. 2. Low lung volumes with superimposed bibasilar opacities, likely atelectasis. Electronically Signed   By: Jeannine Boga M.D.   On: 06/19/2018 06:48     Time spent: From 3:13 PM to 4:27 PM.  Author: Berle Mull, MD [facility] Pager: [phone] 06/19/2018 6:04 PM  If 7PM-7AM, please contact night-coverage at [email or web address], password [facility]

## 2018-06-19 NOTE — ED Provider Notes (Signed)
7:49 AM Assumed care from Dr. Wyvonnia Dusky, please see their note for full history, physical and decision making until this point. In brief this is a 77 y.o. year old female who presented to the ED tonight with Abdominal Pain     Sepsis of unclear etiology at this point. Concern for cholecystitits. But also has purulent draining wound with cellulitis on abdomen.  abx given -  Intraabdominal abx given, rocephin should help if skin as well.  Fluids given Pending ct scan for deeper infection/gall bladder.   My evaluation patient is tachypneic, tachycardic and a white blood cell count is 17.  Her lactic acid is over 3.  This in concert with her obvious cellulitis on her abdomen and questionable cholecystitis is concerning for sepsis.  Appropriate antibiotics already given.  However fluids have not been ordered in fact patient is being diuresed.  She still having pursed lip breathing.  She stated that she would not want to be put on a ventilator if she could all help it.  I discussed with her that given her fluids was necessary as she seemed to be intravascularly depleted with the sepsis but not septic shock.  She understood but did not want a large volume resuscitation as she would prefer not to be on a ventilator.  She is still full code and would do it if absolutely necessary however at this point we will try to avoid it.  I will start her on BiPAP and give her 500 cc and reassess and continue doing that until vital signs, lactic acid improved.  I also think that this will help with her lying flat to get her CT scan.  CT scan with obvious cellulitis on her anterior abdomen with some questionable abscesses.  Antibiotics were restarted.  Also found to have large mass in the right lower quadrant with concern for peritoneal carcinomatosis, ascites.  I discussed with oncology here, Dr. Delton Coombes, who recommends transfer to [facility] for work-up secondary to the complicated medical history and specialist will likely  need to be involved. Discussed with Dr. Carles Collet who will admit.    CRITICAL CARE Performed by: Merrily Pew Total critical care time: 35 minutes Critical care time was exclusive of separately billable procedures and treating other patients. Critical care was necessary to treat or prevent imminent or life-threatening deterioration. Critical care was time spent personally by me on the following activities: development of treatment plan with patient and/or surrogate as well as nursing, discussions with consultants, evaluation of patient's response to treatment, examination of patient, obtaining history from patient or surrogate, ordering and performing treatments and interventions, ordering and review of laboratory studies, ordering and review of radiographic studies, pulse oximetry and re-evaluation of patient's condition.   Labs, studies and imaging reviewed by myself and considered in medical decision making if ordered. Imaging interpreted by radiology.  Labs Reviewed  CBC WITH DIFFERENTIAL/PLATELET - Abnormal; Notable for the following components:      Result Value   WBC 17.8 (*)    RBC 6.32 (*)    Hemoglobin 15.1 (*)    HCT 48.3 (*)    MCV 76.4 (*)    MCH 23.9 (*)    RDW 17.1 (*)    Platelets 549 (*)    Neutro Abs 14.6 (*)    Monocytes Absolute 1.2 (*)    All other components within normal limits  COMPREHENSIVE METABOLIC PANEL - Abnormal; Notable for the following components:   Sodium 132 (*)    Chloride 94 (*)  Glucose, Bld 174 (*)    BUN 44 (*)    Creatinine, Ser 1.85 (*)    GFR calc non Af Amer 25 (*)    GFR calc Af Amer 29 (*)    All other components within normal limits  LIPASE, BLOOD - Abnormal; Notable for the following components:   Lipase 79 (*)    All other components within normal limits  URINALYSIS, ROUTINE W REFLEX MICROSCOPIC - Abnormal; Notable for the following components:   Color, Urine AMBER (*)    APPearance HAZY (*)    Protein, ur 30 (*)    Bacteria, UA  RARE (*)    All other components within normal limits  I-STAT CG4 LACTIC ACID, ED - Abnormal; Notable for the following components:   Lactic Acid, Venous 3.38 (*)    All other components within normal limits  CULTURE, BLOOD (ROUTINE X 2)  CULTURE, BLOOD (ROUTINE X 2)  URINE CULTURE  BRAIN NATRIURETIC PEPTIDE  TROPONIN I  I-STAT CG4 LACTIC ACID, ED    DG Chest Portable 1 View  Final Result    CT ABDOMEN PELVIS WO CONTRAST    (Results Pending)    No follow-ups on file.    Merrily Pew, MD 06/19/18 1128

## 2018-06-19 NOTE — ED Notes (Signed)
Placed on bi pap by rt at this time

## 2018-06-19 NOTE — ED Triage Notes (Signed)
Pt arrived from home via [facility] c/o right sided abdominal pain starting at the rib cage and moving down. Pt denies fall N/V and presents SOB on 4L Nasal Cannula. Pt states last BM yesterday.

## 2018-06-19 NOTE — Consult Note (Signed)
[facility] Surgery Consult Note  [name] Feb 11, 1941  [id number].    Requesting MD: Orson Eva Chief Complaint/Reason for Consult: abdominal pain  HPI:  [name] is a 77yo female with multiple medical problems including DM, CHF, COPD on 3L home O2, and OSA, who was transferred from [facility] today with acute onset abdominal pain. States that she ate cabbage and corn bread for dinner last night, then around 7pm started having diffuse abdominal pain. Felt like gas pain, but the pain continued to worsen so she went to the ED. Associated with nausea and hot flashes, no emesis. Denies diarrhea, fever, chills, or recent unexpected weight loss, chest pain, but admits to chronic SOB. Last BM yesterday afternoon. Typically has a normal BM daily, but over the last week she has been constipated until yesterday afternoon. Uses miralax PRN at home. Of note, patient does state that she has noted some abdominal bloating for about 1 week, but she was not having any abdominal pain until last night.  ED workup included CT abdomen/pelvis which showed a 16.8 x 11.7 by 16.8 cm solid and cystic mass measuring in RLQ concerning for malignancy, diffuse peritoneal nodularity and infiltration of the omentum worrisome for diffuse peritoneal carcinomatosis, marked cellulitic changes involving the patient's pannus, and a gallstone. WBC 17.8, lactic acid 3.38, Cr 1.85, lipase 79, LFTs WNL.  -Abdominal surgical history: sigmoid colectomy/colostomy 10/09/2011 for perforated diverticulitis, colostomy reversal 06/28/2012 Dr. [name], hysterectomy -Last colonoscopy 2010 -Anticoagulants: none -Former smoker, quit about 10 years ago -Lives at home with husband, has an aid that helps intermittently -Ambulates with a walker, or leans on objects around the house, but unable to ambulate any amount of distance except between chair and bathroom etc.  She sleeps in her lift chair.  ROS: Review of Systems   Constitutional: Negative.  Negative for chills and fever.  HENT: Negative.   Eyes: Negative.   Respiratory: Positive for shortness of breath.   Cardiovascular: Negative.  Negative for chest pain.  Gastrointestinal: Positive for abdominal pain, constipation and nausea. Negative for diarrhea and vomiting.  Genitourinary: Negative.   Musculoskeletal: Positive for back pain and joint pain.  Skin: Positive for rash.  Neurological: Negative.    All systems reviewed and otherwise negative except for as above  Family History  Problem Relation Age of Onset  . Heart disease Father   . Diabetes Father   . Hypertension Father   . Diabetes Mother   . Hypertension Mother   . Heart disease Brother   . Diabetes Brother   . Diabetes Sister   . Colon cancer Neg Hx   . Colon polyps Neg Hx   . Esophageal cancer Neg Hx   . Kidney disease Neg Hx   . Gallbladder disease Neg Hx     Past Medical History:  Diagnosis Date  . Anemia   . Arthritis   . Bronchitis 05/2014  . Cellulitis of lower extremity 06/2014   bilateral  . CHF (congestive heart failure) ([place])   . COPD (chronic obstructive pulmonary disease) ([place])   . Diabetes mellitus   . Diverticulitis   . Gallstone   . Hypertension   . MRSA (methicillin resistant Staphylococcus aureus)    has now "tested negative"- no open wounds as of 02-10-15  . Obesity   . On home O2   . Shortness of breath    with exertion   . Sleep apnea    cpap at 10   . Sleep  apnea   . Thyroid disease   . Tubular adenoma of colon     Past Surgical History:  Procedure Laterality Date  . ABDOMINAL HYSTERECTOMY  1979  . ABDOMINAL HYSTERECTOMY  1978  . APPENDECTOMY    . APPLICATION OF WOUND VAC  wound vac  . APPLICATION OF WOUND VAC  06/26/2012   Procedure: APPLICATION OF WOUND VAC;  Surgeon: Gayland Curry, MD,FACS;  Location: [facility];  Service: General;;  . BACK SURGERY    . COLON RESECTION  10/09/2011   Procedure: COLON RESECTION;  Surgeon: Gayland Curry,  MD;  Location: [place];  Service: General;  Laterality: N/A;  Colon Resection with colostomy  . COLON RESECTION  06/26/2012   Procedure: COLON RESECTION;  Surgeon: Gayland Curry, MD,FACS;  Location: [facility];  Service: General;  Laterality: N/A;  . ESOPHAGOGASTRODUODENOSCOPY (EGD) WITH PROPOFOL N/A 02/17/2015   Procedure: ESOPHAGOGASTRODUODENOSCOPY (EGD) WITH PROPOFOL;  Surgeon: Jerene Bears, MD;  Location: [facility];  Service: Gastroenterology;  Laterality: N/A;  . jackson pratt    . PROCTOSCOPY  06/26/2012   Procedure: PROCTOSCOPY;  Surgeon: Gayland Curry, MD,FACS;  Location: [facility];  Service: General;  Laterality: N/A;  Rigid Proctoscopy    Social History:  reports that she quit smoking about 7 years ago. She has never used smokeless tobacco. She reports that she does not drink alcohol or use drugs.  Allergies:  Allergies  Allergen Reactions  . Adhesive [Tape] Hives and Rash  . Codeine Itching and Other (See Comments)  . Lovenox [Enoxaparin Sodium]     Local rash May 2019 w/ppx dosing Has tolerated SQ heparin in the past  . Vancomycin Itching and Rash  . Zosyn [Piperacillin Sod-Tazobactam So] Itching and Rash    Medications Prior to Admission  Medication Sig Dispense Refill  . aspirin 81 MG tablet Take 81 mg by mouth at bedtime.     . budesonide-formoterol (SYMBICORT) 160-4.5 MCG/ACT inhaler Inhale 2 puffs into the lungs 2 (two) times daily.    . Calcium Carb-Cholecalciferol (CALCIUM 600 + D PO) Take 2 tablets by mouth daily.    . diphenhydrAMINE (BENADRYL) 25 mg capsule Take 25 mg by mouth every 6 (six) hours as needed for itching.    Marland Kitchen ipratropium-albuterol (DUONEB) 0.5-2.5 (3) MG/3ML SOLN Take 3 mLs by nebulization every 4 (four) hours as needed (for wheezing or shortness of breath).     Marland Kitchen JANUVIA 100 MG tablet Take 100 mg by mouth daily.     Marland Kitchen levothyroxine (SYNTHROID, LEVOTHROID) 50 MCG tablet Take 50 mcg by mouth daily.    . metFORMIN (GLUCOPHAGE) 500 MG tablet Take 1,000 mg by  mouth 2 (two) times daily with a meal.     . metolazone (ZAROXOLYN) 2.5 MG tablet Take 1 tablet (2.5 mg total) by mouth as needed. 10 tablet 0  . montelukast (SINGULAIR) 10 MG tablet Take 10 mg by mouth daily.     . polyethylene glycol (MIRALAX / GLYCOLAX) packet Take 17 g by mouth daily with lunch.     . potassium chloride SA (K-DUR,KLOR-CON) 20 MEQ tablet Take 1 tablet (20 mEq total) by mouth daily. 30 tablet 0  . torsemide (DEMADEX) 20 MG tablet Take 3 tablets (60 mg total) by mouth 2 (two) times daily. 180 tablet 6  . traMADol (ULTRAM) 50 MG tablet Take 50 mg by mouth every 4 (four) hours as needed (for pain).     . Wheat Dextrin (BENEFIBER) POWD Take 15 g by mouth  daily.     . betamethasone dipropionate (DIPROLENE) 0.05 % cream     . doxycycline (VIBRA-TABS) 100 MG tablet Take 1 tablet (100 mg total) by mouth 2 (two) times daily. Take until bottle empty. Take with food. (Patient not taking: Reported on 06/19/2018) 14 tablet 0  . fluconazole (DIFLUCAN) 150 MG tablet Take 150 mg by mouth daily. TAKING 1 EVERY FOURTH DAY    . Glucosamine-Chondroit-Vit C-Mn (GLUCOSAMINE 1500 COMPLEX PO) Take 2 tablets by mouth daily.    . mupirocin cream (BACTROBAN) 2 % Apply to the right buttock wound twice a day. 30 g 0  . nystatin (MYCOSTATIN/NYSTOP) powder Apply topically 2 (two) times daily. 30 g 0    Prior to Admission medications   Medication Sig Start Date End Date Taking? Authorizing Provider  aspirin 81 MG tablet Take 81 mg by mouth at bedtime.    Yes [provider]  budesonide-formoterol (SYMBICORT) 160-4.5 MCG/ACT inhaler Inhale 2 puffs into the lungs 2 (two) times daily.   Yes [provider]  Calcium Carb-Cholecalciferol (CALCIUM 600 + D PO) Take 2 tablets by mouth daily.   Yes [provider]  diphenhydrAMINE (BENADRYL) 25 mg capsule Take 25 mg by mouth every 6 (six) hours as needed for itching.   Yes [provider]  ipratropium-albuterol (DUONEB) 0.5-2.5 (3)  MG/3ML SOLN Take 3 mLs by nebulization every 4 (four) hours as needed (for wheezing or shortness of breath).  01/10/18  Yes [provider]  JANUVIA 100 MG tablet Take 100 mg by mouth daily.  11/11/14  Yes [provider]  levothyroxine (SYNTHROID, LEVOTHROID) 50 MCG tablet Take 50 mcg by mouth daily. 08/04/15  Yes [provider]  metFORMIN (GLUCOPHAGE) 500 MG tablet Take 1,000 mg by mouth 2 (two) times daily with a meal.    Yes [provider]  metolazone (ZAROXOLYN) 2.5 MG tablet Take 1 tablet (2.5 mg total) by mouth as needed. 05/16/18 08/14/18 Yes Bensimhon, Shaune Pascal, MD  montelukast (SINGULAIR) 10 MG tablet Take 10 mg by mouth daily.    Yes [provider]  polyethylene glycol (MIRALAX / GLYCOLAX) packet Take 17 g by mouth daily with lunch.    Yes [provider]  potassium chloride SA (K-DUR,KLOR-CON) 20 MEQ tablet Take 1 tablet (20 mEq total) by mouth daily. 05/16/18  Yes Bensimhon, Shaune Pascal, MD  torsemide (DEMADEX) 20 MG tablet Take 3 tablets (60 mg total) by mouth 2 (two) times daily. 05/27/18 08/25/18 Yes Bensimhon, Shaune Pascal, MD  traMADol (ULTRAM) 50 MG tablet Take 50 mg by mouth every 4 (four) hours as needed (for pain).  01/09/12  Yes [provider]  Wheat Dextrin (BENEFIBER) POWD Take 15 g by mouth daily.    Yes [provider]  betamethasone dipropionate (DIPROLENE) 0.05 % cream  05/31/18   [provider]  doxycycline (VIBRA-TABS) 100 MG tablet Take 1 tablet (100 mg total) by mouth 2 (two) times daily. Take until bottle empty. Take with food. Patient not taking: Reported on 06/19/2018 05/29/18   Georgiana Shore, NP  fluconazole (DIFLUCAN) 150 MG tablet Take 150 mg by mouth daily. TAKING 1 [place]    [provider]  Glucosamine-Chondroit-Vit C-Mn (GLUCOSAMINE 1500 COMPLEX PO) Take 2 tablets by mouth daily.    [provider]  mupirocin cream (BACTROBAN) 2 % Apply to the right buttock wound  twice a day. 03/18/18   Johnson, Clanford L, MD  nystatin (MYCOSTATIN/NYSTOP) powder Apply topically 2 (two) times  daily. 03/18/18   Johnson, Clanford L, MD    Blood pressure 105/68, pulse 92, temperature 97.6 F (36.4 C), temperature source Oral, resp. rate (!) 22, height '5\' 5"'  (1.651 m), weight 122.5 kg, SpO2 96 %. Physical Exam: Physical Exam General: chronically ill-appearing, dyspneic, elderly white female who is laying in bed in NAD. HEENT: head is normocephalic, atraumatic.  Sclera are noninjected.  PERRL.  Ears and nose without any masses or lesions.  Mouth is pink and moist.  Heart: regular, rate, and rhythm.  Normal s1,s2. No obvious murmurs, gallops, or rubs noted.  Palpable radial pulses; however, only a right posterior tib pulse could be dopplered.  No other pulses noted.  Her feet were cold and purple.  She did have some sensation and was able to move them. Lungs: CTAB, no wheezes, rhonchi, or rales noted. Labored breathing with puffing used to help breath.  O2 in place at 3L.  desats into the mid 80s with talking at times. Abd: morbidly obese, fullness, distention noted. Possible palpation of mass in right lower to mid abdomen noted. Tender on right side of the abdomen.  Cellulitis noted of her pannus with old wounds from scratching at night while sleeping.   MS: all 4 extremities are symmetrical with significant cyanosis of her B feet.  These are cold.  See description above.  +1-2 edema in lower extremities. Skin: warm and dry with no masses, but she has scattered sores on her arms, abdomen, legs, secondary to scratching at night. Psych: A&Ox3 with an appropriate affect.   Results for orders placed or performed during the hospital encounter of 06/19/18 (from the past 48 hour(s))  Urinalysis, Routine w reflex microscopic     Status: Abnormal   Collection Time: 06/19/18  6:07 AM  Result Value Ref Range   Color, Urine AMBER (A) YELLOW    Comment: BIOCHEMICALS MAY BE AFFECTED BY  COLOR   APPearance HAZY (A) CLEAR   Specific Gravity, Urine 1.023 1.005 - 1.030   pH 5.0 5.0 - 8.0   Glucose, UA NEGATIVE NEGATIVE mg/dL   Hgb urine dipstick NEGATIVE NEGATIVE   Bilirubin Urine NEGATIVE NEGATIVE   Ketones, ur NEGATIVE NEGATIVE mg/dL   Protein, ur 30 (A) NEGATIVE mg/dL   Nitrite NEGATIVE NEGATIVE   Leukocytes, UA NEGATIVE NEGATIVE   RBC / HPF 0-5 0 - 5 RBC/hpf   WBC, UA 6-10 0 - 5 WBC/hpf   Bacteria, UA RARE (A) NONE SEEN   Squamous Epithelial / LPF 6-10 0 - 5   Mucus PRESENT    Hyaline Casts, UA PRESENT     Comment: Performed at [facility], [address] [place], [place] [address]  Brain natriuretic peptide     Status: None   Collection Time: 06/19/18  6:29 AM  Result Value Ref Range   B Natriuretic Peptide 52.0 0.0 - 100.0 pg/mL    Comment: Performed at [facility], [address] [place], [place] [address]  CBC with Differential/Platelet     Status: Abnormal   Collection Time: 06/19/18  6:29 AM  Result Value Ref Range   WBC 17.8 (H) 4.0 - 10.5 K/uL   RBC 6.32 (H) 3.87 - 5.11 MIL/uL   Hemoglobin 15.1 (H) 12.0 - 15.0 g/dL   HCT 48.3 (H) 36.0 - 46.0 %   MCV 76.4 (L) 78.0 - 100.0 fL   MCH 23.9 (L) 26.0 - 34.0 pg   MCHC 31.3 30.0 - 36.0 g/dL   RDW 17.1 (H) 11.5 - 15.5 %  Platelets 549 (H) 150 - 400 K/uL   Neutrophils Relative % 81 %   Neutro Abs 14.6 (H) 1.7 - 7.7 K/uL   Lymphocytes Relative 11 %   Lymphs Abs 1.9 0.7 - 4.0 K/uL   Monocytes Relative 7 %   Monocytes Absolute 1.2 (H) 0.1 - 1.0 K/uL   Eosinophils Relative 1 %   Eosinophils Absolute 0.1 0.0 - 0.7 K/uL   Basophils Relative 0 %   Basophils Absolute 0.1 0.0 - 0.1 K/uL    Comment: Performed at [facility], [address] [place], [place] [address]  Comprehensive metabolic panel     Status: Abnormal   Collection Time: 06/19/18  6:29 AM  Result Value Ref Range   Sodium 132 (L) 135 - 145 mmol/L   Potassium 5.0 3.5 - 5.1 mmol/L   Chloride 94 (L) 98 - 111 mmol/L   CO2 25 22 - 32 mmol/L    Glucose, Bld 174 (H) 70 - 99 mg/dL   BUN 44 (H) 8 - 23 mg/dL   Creatinine, Ser 1.85 (H) 0.44 - 1.00 mg/dL   Calcium 9.3 8.9 - 10.3 mg/dL   Total Protein 7.5 6.5 - 8.1 g/dL   Albumin 3.5 3.5 - 5.0 g/dL   AST 19 15 - 41 U/L   ALT 12 0 - 44 U/L   Alkaline Phosphatase 102 38 - 126 U/L   Total Bilirubin 1.1 0.3 - 1.2 mg/dL   GFR calc non Af Amer 25 (L) >60 mL/min   GFR calc Af Amer 29 (L) >60 mL/min    Comment: (NOTE) The eGFR has been calculated using the CKD EPI equation. This calculation has not been validated in all clinical situations. eGFR's persistently <60 mL/min signify possible Chronic Kidney Disease.    Anion gap 13 5 - 15    Comment: Performed at [facility], [address], [place], [place] [address]  Lipase, blood     Status: Abnormal   Collection Time: 06/19/18  6:29 AM  Result Value Ref Range   Lipase 79 (H) 11 - 51 U/L    Comment: Performed at [facility], [address] [place], [place] [address]  Troponin I     Status: None   Collection Time: 06/19/18  6:29 AM  Result Value Ref Range   Troponin I <0.03 <0.03 ng/mL    Comment: Performed at [facility], [address], [place], [place] [address]  I-Stat CG4 Lactic Acid, ED     Status: Abnormal   Collection Time: 06/19/18  6:36 AM  Result Value Ref Range   Lactic Acid, Venous 3.38 (HH) 0.5 - 1.9 mmol/L  Blood culture (routine x 2)     Status: None (Preliminary result)   Collection Time: 06/19/18  7:30 AM  Result Value Ref Range   Specimen Description BLOOD RIGHT HAND    Special Requests      BOTTLES DRAWN AEROBIC AND ANAEROBIC Blood Culture adequate volume Performed at [facility], [address], [place], [place] [address]    Culture PENDING    Report Status PENDING   Blood culture (routine x 2)     Status: None (Preliminary result)   Collection Time: 06/19/18  7:39 AM  Result Value Ref Range   Specimen Description BLOOD    Special Requests      BOTTLES DRAWN AEROBIC AND ANAEROBIC Blood Culture adequate  volume Performed at [facility], [address] [place], [place] [address]    Culture PENDING    Report Status  PENDING   Blood gas, arterial ([facility] ONLY)     Status: Abnormal   Collection Time: 06/19/18  8:30 AM  Result Value Ref Range   O2 Content 3.0 L/min   Delivery systems NASAL CANNULA    pH, Arterial 7.414 7.350 - 7.450   pCO2 arterial 33.0 32.0 - 48.0 mmHg   pO2, Arterial 145 (H) 83.0 - 108.0 mmHg   Bicarbonate 22.3 20.0 - 28.0 mmol/L   Acid-base deficit 3.2 (H) 0.0 - 2.0 mmol/L   O2 Saturation 98.8 %   Patient temperature 37.0    Collection site RIGHT RADIAL    Drawn by [id number]    Sample type ARTERIAL DRAW    Allens test (pass/fail) PASS PASS    Comment: Performed at [facility], [address] [place], [place] [address]  I-Stat CG4 Lactic Acid, ED     Status: Abnormal   Collection Time: 06/19/18  8:48 AM  Result Value Ref Range   Lactic Acid, Venous 2.93 (HH) 0.5 - 1.9 mmol/L   Comment NOTIFIED PHYSICIAN    Ct Abdomen Pelvis Wo Contrast  Result Date: 06/19/2018 CLINICAL DATA:  Abdominal swelling and right-sided pain. EXAM: CT ABDOMEN AND PELVIS WITHOUT CONTRAST TECHNIQUE: Multidetector CT imaging of the abdomen and pelvis was performed following the standard protocol without IV contrast. COMPARISON:  01/15/2013 FINDINGS: Lower chest: Small pleural effusions are identified with overlying atelectasis/consolidation. Hepatobiliary: No focal liver abnormality. Stone within the gallbladder neck measures 1.1 cm, image 26/2. No biliary ductal dilatation. Pancreas: Multiple calcifications are identified throughout the pancreas compatible with chronic pancreatitis. No inflammation or mass noted. Spleen: Spleen is unremarkable. Adrenals/Urinary Tract: Normal appearance of the adrenal glands. Several hyperdense lesions are identified within the left kidney. The largest is in the upper pole measuring 3.5 cm and appears new from comparison exam. No hydronephrosis identified  bilaterally.Urinary bladder appears collapsed. Stomach/Bowel: The stomach appears nondilated. Small and large bowel loops have a normal course and caliber. Vascular/Lymphatic: Aortic atherosclerosis. No aneurysm. No retroperitoneal adenopathy. No iliac adenopathy. Right inguinal lymph node is enlarged measuring 1.4 cm. Adjacent lymph node measures 1.2 cm. Reproductive: The uterus appears surgically absent. Is the ovaries are not confidently identified on this exam. Other: There is a moderate volume of ascites within the peritoneal cavity which within the limitations of unenhanced technique appears at least partially loculated. Fluid extends scratch set perihepatic and perisplenic ascites is noted. There is diffuse peritoneal nodularity identified within the abdomen and pelvis as well as soft tissue infiltration throughout the omentum compatible with omental caking. This is new compared with the previous exam. Within the right lower quadrant of the abdomen there is a large solid and cystic mass measuring 16.8 x 11.7 by 16.8 cm. There is high attenuation material extending into the right retroperitoneum worrisome for hematoma. There is a left lower quadrant hernia containing low-attenuation fluid measuring approximately 4 cm, image 81/2. Periumbilical hernia, eccentric to the left, is also noted containing fat, fluid and soft tissue. There is surrounding marked skin thickening and ulceration identified. Musculoskeletal: There is marked skin thickening overlying the pannus. Diffuse subcutaneous fat stranding throughout the pannus is also noted compatible with cellulitis. IMPRESSION: 1. There is a large solid and cystic mass identified within the right lower quadrant of the abdomen. Primary concern is for malignancy. Additionally, since the previous exam there has been interval development of diffuse peritoneal nodularity, and infiltration of the omentum, worrisome for diffuse peritoneal carcinomatosis. 2. High  attenuation material extending into the  right retroperitoneum is identified, concerning for retroperitoneal hematoma. 3. There is marked cellulitic changes involving the patient's pannus. 4. Periumbilical hernia is identified with surrounding skin thickening and ulceration. Cannot exclude underlying subcutaneous abscess. 5. Gallstone. Electronically Signed   By: Kerby Moors M.D.   On: 06/19/2018 10:19   Dg Chest Portable 1 View  Result Date: 06/19/2018 CLINICAL DATA:  Initial evaluation for acute shortness of breath. EXAM: PORTABLE CHEST 1 VIEW COMPARISON:  Prior radiograph from 03/17/2018. FINDINGS: Cardiomegaly, grossly stable from previous. Mediastinal silhouette within normal limits. Aortic atherosclerosis. Lungs are hypoinflated. Diffuse vascular congestion with interstitial prominence, suggesting diffuse pulmonary interstitial edema. Superimposed bibasilar opacities favored to reflect atelectasis. Infiltrates not excluded, and could be considered in the correct clinical setting. No obvious large pleural effusion. No pneumothorax. No acute osseus abnormality. IMPRESSION: 1. Cardiomegaly with diffuse vascular congestion and interstitial prominence, suggesting mild pulmonary interstitial edema. 2. Low lung volumes with superimposed bibasilar opacities, likely atelectasis. Electronically Signed   By: Jeannine Boga M.D.   On: 06/19/2018 06:48      Assessment/Plan Abdominal pain secondary to intra-abdominal mass The patient presented to the ED with acute onset of abdominal pain last night.  It was after eating supper; however, she is only noted to have a gallstone on her CT scan and no concerning findings for cholecystitis.  She was found to have a large cystic solid mass in her RLQ extending into her RUQ.  She has significant omental caking etc around this mass as well as a possible RTP hematoma.  This is almost certainly the source of her symptoms and not her gallstone.  No further work up is  needed for her gallbladder.  Unfortunately the patient has multiple medical problems and co-morbidities.  She would not tolerate an operation and very likely may not tolerate even a biopsy that would require conscious sedation given her severe respiratory status.  She does have ascites and a paracentesis could be attempted to see if this would assist in a diagnosis.  This was all discussed with Dr. Posey Pronto of the medical service who is going to discuss goals of care etc with the patient and her family to determine how to proceed in her work up.  May eat from surgical standpoint. Abdominal wall cellulitis This is likely secondary to the chronic wounds she has from scratching her belly while she sleeps and unrelated to her intra-abdominal mass.  Agree with abx therapy for this. Cyanotic feet During our exam we noted the patient have cyanotic feet.  A doppler signal was only able to be found at the right posterior tibialis artery.  Will defer to primary service regarding further care for this.  DM HTN COPD, on home O2, 3L CHF OSA Morbid obesity  ID - Doxycycline, Merrem VTE - per medicine FEN - NPO, but can have a diet from our standpoint.   Henreitta Cea, PA-C [facility] 06/19/2018, 3:24 PM Mon 7:00 am -11:30 AM Tues-Fri 7:00 am-4:30 pm Sat-Sun 7:00 am-11:30 am

## 2018-06-20 DIAGNOSIS — R6510 Systemic inflammatory response syndrome (SIRS) of non-infectious origin without acute organ dysfunction: Secondary | ICD-10-CM

## 2018-06-20 DIAGNOSIS — N179 Acute kidney failure, unspecified: Secondary | ICD-10-CM

## 2018-06-20 DIAGNOSIS — R180 Malignant ascites: Secondary | ICD-10-CM

## 2018-06-20 DIAGNOSIS — J9621 Acute and chronic respiratory failure with hypoxia: Secondary | ICD-10-CM

## 2018-06-20 DIAGNOSIS — Z515 Encounter for palliative care: Secondary | ICD-10-CM

## 2018-06-20 DIAGNOSIS — D500 Iron deficiency anemia secondary to blood loss (chronic): Secondary | ICD-10-CM

## 2018-06-20 DIAGNOSIS — R1903 Right lower quadrant abdominal swelling, mass and lump: Secondary | ICD-10-CM

## 2018-06-20 DIAGNOSIS — N189 Chronic kidney disease, unspecified: Secondary | ICD-10-CM

## 2018-06-20 DIAGNOSIS — Z7189 Other specified counseling: Secondary | ICD-10-CM

## 2018-06-20 LAB — COMPREHENSIVE METABOLIC PANEL
ALT: 10 U/L (ref 0–44)
ANION GAP: 15 (ref 5–15)
AST: 19 U/L (ref 15–41)
Albumin: 3.1 g/dL — ABNORMAL LOW (ref 3.5–5.0)
Alkaline Phosphatase: 138 U/L — ABNORMAL HIGH (ref 38–126)
BILIRUBIN TOTAL: 0.9 mg/dL (ref 0.3–1.2)
BUN: 53 mg/dL — AB (ref 8–23)
CO2: 27 mmol/L (ref 22–32)
Calcium: 9.1 mg/dL (ref 8.9–10.3)
Chloride: 94 mmol/L — ABNORMAL LOW (ref 98–111)
Creatinine, Ser: 3.21 mg/dL — ABNORMAL HIGH (ref 0.44–1.00)
GFR calc Af Amer: 15 mL/min — ABNORMAL LOW (ref >60)
GFR, EST NON AFRICAN AMERICAN: 13 mL/min — AB (ref >60)
Glucose, Bld: 168 mg/dL — ABNORMAL HIGH (ref 70–99)
POTASSIUM: 5.3 mmol/L — AB (ref 3.5–5.1)
Sodium: 136 mmol/L (ref 135–145)
TOTAL PROTEIN: 7 g/dL (ref 6.5–8.1)

## 2018-06-20 LAB — CBC
HEMATOCRIT: 44.5 % (ref 36.0–46.0)
HEMOGLOBIN: 12.9 g/dL (ref 12.0–15.0)
MCH: 23 pg — ABNORMAL LOW (ref 26.0–34.0)
MCHC: 29 g/dL — ABNORMAL LOW (ref 30.0–36.0)
MCV: 79.5 fL (ref 78.0–100.0)
Platelets: 526 10*3/uL — ABNORMAL HIGH (ref 150–400)
RBC: 5.6 MIL/uL — ABNORMAL HIGH (ref 3.87–5.11)
RDW: 18.1 % — AB (ref 11.5–15.5)
WBC: 22.4 10*3/uL — AB (ref 4.0–10.5)

## 2018-06-20 LAB — URINE CULTURE: Culture: 60000 — AB

## 2018-06-20 LAB — MRSA PCR SCREENING: MRSA by PCR: NEGATIVE

## 2018-06-20 LAB — GLUCOSE, CAPILLARY
GLUCOSE-CAPILLARY: 168 mg/dL — AB (ref 70–99)
Glucose-Capillary: 154 mg/dL — ABNORMAL HIGH (ref 70–99)
Glucose-Capillary: 144 mg/dL — ABNORMAL HIGH (ref 70–99)

## 2018-06-20 MED ORDER — ONDANSETRON HCL 4 MG/2ML IJ SOLN
4.0000 mg | Freq: Four times a day (QID) | INTRAMUSCULAR | Status: DC | PRN
  Administered 2018-06-20 – 2018-06-21 (×2): 4 mg via INTRAVENOUS
  Filled 2018-06-20 (×2): qty 2

## 2018-06-20 MED ORDER — FENTANYL 50 MCG/HR TD PT72
50.0000 ug | MEDICATED_PATCH | TRANSDERMAL | Status: DC
  Filled 2018-06-20: qty 1

## 2018-06-20 MED ORDER — HYDROMORPHONE HCL 1 MG/ML IJ SOLN: 1.0000 mg | INTRAMUSCULAR | Status: DC | PRN

## 2018-06-20 MED ORDER — DIPHENHYDRAMINE HCL 50 MG/ML IJ SOLN
12.5000 mg | Freq: Four times a day (QID) | INTRAMUSCULAR | Status: DC | PRN
  Administered 2018-06-20: 12.5 mg via INTRAVENOUS
  Filled 2018-06-20: qty 1

## 2018-06-20 MED ORDER — FENTANYL BOLUS VIA INFUSION
25.0000 ug | INTRAVENOUS | Status: DC | PRN
  Administered 2018-06-20 (×3): 25 ug via INTRAVENOUS
  Filled 2018-06-20: qty 25

## 2018-06-20 MED ORDER — FENTANYL CITRATE (PF) 100 MCG/2ML IJ SOLN
50.0000 ug | INTRAMUSCULAR | Status: DC | PRN
  Administered 2018-06-20 (×2): 50 ug via INTRAVENOUS
  Filled 2018-06-20: qty 2

## 2018-06-20 MED ORDER — MORPHINE SULFATE (PF) 2 MG/ML IV SOLN
2.0000 mg | INTRAVENOUS | Status: DC | PRN
  Administered 2018-06-20: 2 mg via INTRAVENOUS
  Filled 2018-06-20: qty 1

## 2018-06-20 MED ORDER — CHLORHEXIDINE GLUCONATE 0.12 % MT SOLN
15.0000 mL | Freq: Two times a day (BID) | OROMUCOSAL | Status: DC
  Administered 2018-06-20 – 2018-06-21 (×2): 15 mL via OROMUCOSAL
  Filled 2018-06-20 (×2): qty 15

## 2018-06-20 MED ORDER — FENTANYL BOLUS VIA INFUSION
50.0000 ug | INTRAVENOUS | Status: DC | PRN
  Administered 2018-06-20 – 2018-06-21 (×3): 50 ug via INTRAVENOUS
  Filled 2018-06-20: qty 50

## 2018-06-20 MED ORDER — CALCIUM CARBONATE ANTACID 500 MG PO CHEW
1.0000 | CHEWABLE_TABLET | Freq: Every day | ORAL | Status: DC | PRN
  Administered 2018-06-20: 200 mg via ORAL
  Filled 2018-06-20 (×3): qty 1

## 2018-06-20 MED ORDER — SODIUM CHLORIDE 0.9 % IV SOLN
INTRAVENOUS | Status: DC
  Administered 2018-06-20: 08:00:00 via INTRAVENOUS

## 2018-06-20 MED ORDER — FENTANYL 2500MCG IN NS 250ML (10MCG/ML) PREMIX INFUSION
50.0000 ug/h | INTRAVENOUS | Status: DC
  Administered 2018-06-20: 25 ug/h via INTRAVENOUS
  Filled 2018-06-20 (×2): qty 250

## 2018-06-20 MED ORDER — ORAL CARE MOUTH RINSE
15.0000 mL | Freq: Two times a day (BID) | OROMUCOSAL | Status: DC
  Administered 2018-06-20: 15 mL via OROMUCOSAL

## 2018-06-20 MED ORDER — LORAZEPAM 2 MG/ML IJ SOLN
0.5000 mg | INTRAMUSCULAR | Status: DC | PRN
  Administered 2018-06-20 (×2): 1 mg via INTRAVENOUS
  Filled 2018-06-20 (×2): qty 1

## 2018-06-20 NOTE — Progress Notes (Signed)
[facility] PROGRESS NOTE    Progress Note  [name]  DQQ:[id number] DOB: 10-03-1940 DOA: 06/19/2018 PCP: Sharilyn Sites, MD    Brief Narrative:   [name] is an 77 y.o. female past medical history of diabetes mellitus type 2, chronic kidney disease stage III, COPD chronic respiratory failure on 3 L of oxygen cor pulmonale chronic diastolic heart failure presents with complaints of abdominal pain was found to have a large abdominal mass and omental caking along with ascites and retroperitoneal hematoma  Assessment/Plan:   Abdominal mass, right lower quadrant/Malignant ascites/ SIRS (systemic inflammatory response syndrome) ([place]) CT scan of the abdomen and pelvis on 06/19/2018 showed a large right lower quadrant solid mass with diffuse peritoneal carcinomatosis, and ascites with probable omental caking, with involvement of the left kidney. Surgery evaluated the patient and recommended she is not a candidate for surgical intervention or open biopsy due to her poor respiratory status and they recommended comfort care. I will talk to the family this morning palliative care have been consulted and will be around to talk to the family. She has leukocytosis with tachycardia and meets criteria for Sirs, there is unlikely to be an abdominal wall infection has chronic changes, will discontinue IV antibiotics. She has a very poor prognoses.  Acute respiratory failure with hypoxia due to significant abdominal distention: Patient on nonrebreather satting around 85 to 90%. She relates fentanyl taking care of the pain which continues to be feeling air hunger. We will start her on morphine IV 2 mg every 2 hours as needed for shortness of breath.  New acute kidney injury in the setting of chronic renal disease stage III: Her creatinine back in Jan 23, 2018 was 0.9 on admission 2.4 likely post renal as there are malignant lesions involving the left kidney. We will hold nephrotoxic drugs we will  start on gentle IV fluid hydration.  Basic metabolic panel is pending this morning.  New hyperkalemia: Metabolic panel is pending, about 6 months ago her creatinine was at baseline yesterday was 2.4 likely in the setting of all her new diagnoses. Await basic metabolic panel we will start on gentle IV fluid hydration.  Peritoneal hematoma: CT scan show high attenuation in the right retroperitoneal area concerning for hematoma continue to hold anticoagulation mother has been a mild drop in hemoglobin from 14-2.4 question hemodilution all. We will continue to trend CBCs with the family desires aggressive treatment which I would not recommend due to her multiple comorbidities and very poor prognoses.  Abdominal wall lesions: There is unlikely to be cellulitis these are nontender non-erythematous hard lesion is concern about metastatic disease to the abdominal wall.  Cyanotic toes: ABIs performed with no circulation. Vascular surgery deemed her not a good candidate for surgical intervention due to her multiple comorbidities.  Goals of care discussion: In a 77 year old patient with morbid obesity obstructive sleep apnea, chronic diastolic heart failure, cor pulmonale chronic respiratory failure chronic kidney disease with a large intra-abdominal mass with peritoneal carcinomatosis with renal involvement and a large retroperitoneal hematoma with malignant lesions in the abdominal wall which she is not a good candidate for surgical intervention nor chemotherapy.  I have explained this to the patient husband over the phone and he would like to talk in person when he gets here.  The patient has a very poor prognosis and I would recommend comfort care.  Her life expectancy is days to weeks.  Iron deficiency anemia due to chronic blood loss/IDA (iron deficiency anemia)  DVT prophylaxis: SCD Family Communication:husband Disposition Plan/Barrier to D/C: poor prognosis. Code Status:     Code Status  Orders  (From admission, onward)         Start     Ordered   06/19/18 1513  Do not attempt resuscitation (DNR)  Continuous    Question Answer Comment  In the event of cardiac or respiratory ARREST Do not call a "code blue"   In the event of cardiac or respiratory ARREST Do not perform Intubation, CPR, defibrillation or ACLS   In the event of cardiac or respiratory ARREST Use medication by any route, position, wound care, and other measures to relive pain and suffering. May use oxygen, suction and manual treatment of airway obstruction as needed for comfort.      06/19/18 1513        Code Status History    Date Active Date Inactive Code Status Order ID Comments User Context   03/17/2018 1559 03/18/2018 1933 Full Code [id number]  Kathie Dike, MD ED   01/17/2018 2051 01/23/2018 1611 Full Code [id number]  Vianne Bulls, MD ED   06/05/2014 2322 06/08/2014 1628 Full Code [id number]  Truett Mainland, DO Inpatient   06/26/2012 1702 07/02/2012 1503 Full Code [id number]  Carter Kitten, RN Inpatient   10/09/2011 2022 10/24/2011 1752 Full Code [id number]  Cleaster Corin, RN Inpatient   10/09/2011 1336 10/09/2011 2022 Full Code [id number]  Earle Gell, RN Inpatient        IV Access:    Peripheral IV   Procedures and diagnostic studies:   Ct Abdomen Pelvis Wo Contrast  Result Date: 06/19/2018 CLINICAL DATA:  Abdominal swelling and right-sided pain. EXAM: CT ABDOMEN AND PELVIS WITHOUT CONTRAST TECHNIQUE: Multidetector CT imaging of the abdomen and pelvis was performed following the standard protocol without IV contrast. COMPARISON:  01/15/2013 FINDINGS: Lower chest: Small pleural effusions are identified with overlying atelectasis/consolidation. Hepatobiliary: No focal liver abnormality. Stone within the gallbladder neck measures 1.1 cm, image 26/2. No biliary ductal dilatation. Pancreas: Multiple calcifications are identified throughout the pancreas compatible with chronic pancreatitis. No  inflammation or mass noted. Spleen: Spleen is unremarkable. Adrenals/Urinary Tract: Normal appearance of the adrenal glands. Several hyperdense lesions are identified within the left kidney. The largest is in the upper pole measuring 3.5 cm and appears new from comparison exam. No hydronephrosis identified bilaterally.Urinary bladder appears collapsed. Stomach/Bowel: The stomach appears nondilated. Small and large bowel loops have a normal course and caliber. Vascular/Lymphatic: Aortic atherosclerosis. No aneurysm. No retroperitoneal adenopathy. No iliac adenopathy. Right inguinal lymph node is enlarged measuring 1.4 cm. Adjacent lymph node measures 1.2 cm. Reproductive: The uterus appears surgically absent. Is the ovaries are not confidently identified on this exam. Other: There is a moderate volume of ascites within the peritoneal cavity which within the limitations of unenhanced technique appears at least partially loculated. Fluid extends scratch set perihepatic and perisplenic ascites is noted. There is diffuse peritoneal nodularity identified within the abdomen and pelvis as well as soft tissue infiltration throughout the omentum compatible with omental caking. This is new compared with the previous exam. Within the right lower quadrant of the abdomen there is a large solid and cystic mass measuring 16.8 x 11.7 by 16.8 cm. There is high attenuation material extending into the right retroperitoneum worrisome for hematoma. There is a left lower quadrant hernia containing low-attenuation fluid measuring approximately 4 cm, image 81/2. Periumbilical hernia, eccentric to the left, is also noted containing fat, fluid and soft tissue.  There is surrounding marked skin thickening and ulceration identified. Musculoskeletal: There is marked skin thickening overlying the pannus. Diffuse subcutaneous fat stranding throughout the pannus is also noted compatible with cellulitis. IMPRESSION: 1. There is a large solid and  cystic mass identified within the right lower quadrant of the abdomen. Primary concern is for malignancy. Additionally, since the previous exam there has been interval development of diffuse peritoneal nodularity, and infiltration of the omentum, worrisome for diffuse peritoneal carcinomatosis. 2. High attenuation material extending into the right retroperitoneum is identified, concerning for retroperitoneal hematoma. 3. There is marked cellulitic changes involving the patient's pannus. 4. Periumbilical hernia is identified with surrounding skin thickening and ulceration. Cannot exclude underlying subcutaneous abscess. 5. Gallstone. Electronically Signed   By: Kerby Moors M.D.   On: 06/19/2018 10:19   Dg Chest Portable 1 View  Result Date: 06/19/2018 CLINICAL DATA:  Initial evaluation for acute shortness of breath. EXAM: PORTABLE CHEST 1 VIEW COMPARISON:  Prior radiograph from 03/17/2018. FINDINGS: Cardiomegaly, grossly stable from previous. Mediastinal silhouette within normal limits. Aortic atherosclerosis. Lungs are hypoinflated. Diffuse vascular congestion with interstitial prominence, suggesting diffuse pulmonary interstitial edema. Superimposed bibasilar opacities favored to reflect atelectasis. Infiltrates not excluded, and could be considered in the correct clinical setting. No obvious large pleural effusion. No pneumothorax. No acute osseus abnormality. IMPRESSION: 1. Cardiomegaly with diffuse vascular congestion and interstitial prominence, suggesting mild pulmonary interstitial edema. 2. Low lung volumes with superimposed bibasilar opacities, likely atelectasis. Electronically Signed   By: Jeannine Boga M.D.   On: 06/19/2018 06:48     Medical Consultants:    None.  Anti-Infectives:   None  Subjective:    [name] she relates her pain is controlled was she is significantly short of breath even with a nonrebreather.  Objective:    Vitals:   06/19/18 2240 06/20/18 0108  06/20/18 0425 06/20/18 0549  BP:   100/69 (!) 101/41  Pulse:   86 79  Resp:   20   Temp:   98.5 F (36.9 C)   TempSrc:   Axillary   SpO2: 98% 92% 96% 92%  Weight:      Height:        Intake/Output Summary (Last 24 hours) at 06/20/2018 0746 Last data filed at 06/20/2018 0600 Gross per 24 hour  Intake 725.28 ml  Output 100 ml  Net 625.28 ml   Filed Weights   06/19/18 0554  Weight: 122.5 kg    Exam: General exam: In no acute distress. Respiratory system: She has good air movement which is clear but she is using accessory muscles to breathe. Cardiovascular system: S1 & S2 heard, RRR.  Gastrointestinal system: Difficult abdominal distention soft nontender Central nervous system: Alert and oriented. No focal neurological deficits. Extremities: Bilateral lower extremity edema no calf tenderness with grossly cyanotic toes Skin: She has multiple indurated lesions in the abdominal wall not erythematous or tender malignant Psychiatry: Judgement and insight appear normal. Mood & affect appropriate.    Data Reviewed:    Labs: Basic Metabolic Panel: Recent Labs  Lab 06/19/18 0629 06/19/18 1658  NA 132* 136  K 5.0 5.7*  CL 94* 95*  CO2 25 25  GLUCOSE 174* 175*  BUN 44* 45*  CREATININE 1.85* 2.49*  CALCIUM 9.3 9.3   GFR Estimated Creatinine Clearance: 24.5 mL/min (A) (by C-G formula based on SCr of 2.49 mg/dL (H)). Liver Function Tests: Recent Labs  Lab 06/19/18 0629 06/19/18 1658  AST 19 18  ALT 12 11  ALKPHOS 102 123  BILITOT 1.1 1.0  PROT 7.5 5.7*  ALBUMIN 3.5 3.3*   Recent Labs  Lab 06/19/18 0629  LIPASE 79*   No results for input(s): AMMONIA in the last 168 hours. Coagulation profile Recent Labs  Lab 06/19/18 1658  INR 1.07    CBC: Recent Labs  Lab 06/19/18 0629 06/19/18 1658 06/20/18 0640  WBC 17.8* 20.6* 22.4*  NEUTROABS 14.6* 17.3*  --   HGB 15.1* 14.4 12.9  HCT 48.3* 48.6* 44.5  MCV 76.4* 77.5* 79.5  PLT 549* 562* 526*   Cardiac  Enzymes: Recent Labs  Lab 06/19/18 0629 06/19/18 1658  CKTOTAL  --  26*  TROPONINI <0.03  --    BNP (last 3 results) No results for input(s): PROBNP in the last 8760 hours. CBG: Recent Labs  Lab 06/19/18 1635 06/19/18 2008 06/20/18 0019 06/20/18 0421  GLUCAP 157* 145* 168* 154*   D-Dimer: No results for input(s): DDIMER in the last 72 hours. Hgb A1c: Recent Labs    06/19/18 1658  HGBA1C 6.4*   Lipid Profile: No results for input(s): CHOL, HDL, LDLCALC, TRIG, CHOLHDL, LDLDIRECT in the last 72 hours. Thyroid function studies: No results for input(s): TSH, T4TOTAL, T3FREE, THYROIDAB in the last 72 hours.  Invalid input(s): FREET3 Anemia work up: Recent Labs    06/19/18 1658  RETICCTPCT 1.4   Sepsis Labs: Recent Labs  Lab 06/19/18 0629 06/19/18 0636 06/19/18 0848 06/19/18 1658 06/20/18 0640  WBC 17.8*  --   --  20.6* 22.4*  LATICACIDVEN  --  3.38* 2.93* 3.0*  --    Microbiology Recent Results (from the past 240 hour(s))  Blood culture (routine x 2)     Status: None (Preliminary result)   Collection Time: 06/19/18  7:30 AM  Result Value Ref Range Status   Specimen Description BLOOD RIGHT HAND  Final   Special Requests   Final    BOTTLES DRAWN AEROBIC AND ANAEROBIC Blood Culture adequate volume Performed at [facility], [address], [place], [place] [address]    Culture PENDING  Incomplete   Report Status PENDING  Incomplete  Blood culture (routine x 2)     Status: None (Preliminary result)   Collection Time: 06/19/18  7:39 AM  Result Value Ref Range Status   Specimen Description BLOOD  Final   Special Requests   Final    BOTTLES DRAWN AEROBIC AND ANAEROBIC Blood Culture adequate volume Performed at [facility], [address] [place],  [address]    Culture PENDING  Incomplete   Report Status PENDING  Incomplete     Medications:   . insulin aspart  0-9 Units Subcutaneous Q4H  . ipratropium-albuterol  3 mL Nebulization Q6H  .  levothyroxine  25 mcg Intravenous QAC breakfast   Continuous Infusions: . sodium chloride    . doxycycline (VIBRAMYCIN) IV 125 mL/hr at 06/20/18 0600  . meropenem (MERREM) IV Stopped (06/20/18 0002)      LOS: 1 day   Charlynne Cousins  [facility] Pager [phone]  *Please refer to [email or web address], password [email or web address] to get updated schedule on who will round on this patient, as hospitalists switch teams weekly. If 7PM-7AM, please contact night-coverage at [email or web address], password [email or web address] for any overnight needs.  06/20/2018, 7:46 AM

## 2018-06-20 NOTE — Progress Notes (Signed)
Nutrition Brief Note  Chart reviewed. Pt now transitioning to comfort care.  No further nutrition interventions warranted at this time.  Please re-consult as needed.   Averill Pons A. Suleman Gunning, RD, LDN, CDE Pager: [phone] After hours Pager: [phone]  

## 2018-06-20 NOTE — Progress Notes (Signed)
Daily Progress Note   Patient Name: [name]       Date: 06/20/2018 DOB: 06-Jul-1941  Age: 77 y.o. MRN#: [id number] Attending Physician: Charlynne Cousins, MD Primary Care Physician: Sharilyn Sites, MD Admit Date: 06/19/2018  Reason for Consultation/Follow-up: Establishing goals of care and Pain control  Subjective: I met today with patient, her husband, cousin, and close friend.  We discussed clinical course as well as wishes moving forward in regard to care this hospitalization.  Concepts specific to code status and rehospitalization discussed.  We discussed difference between a aggressive medical intervention path and a palliative, comfort focused care path.  Values and goals of care important to patient and family were attempted to be elicited.  Concept of Hospice discussed including home hospice versus residential hospice.  Questions and concerns addressed.   PMT will continue to support holistically.  Length of Stay: 1  Current Medications: Scheduled Meds:    Continuous Infusions: . fentaNYL infusion INTRAVENOUS 25 mcg/hr (06/20/18 1344)    PRN Meds: albuterol, diphenhydrAMINE, fentaNYL, fentaNYL (SUBLIMAZE) injection, HYDROmorphone (DILAUDID) injection  Physical Exam         General: Alert, awake, in moderate distress.  Heart: Regular rate and rhythm. No murmur appreciated. Lungs: Diminished air movement, clear Abdomen: distended, tender to palpation, positive bowel sounds.  Ext: + edema Skin: Warm and dry Neuro: Grossly intact, nonfocal.   Vital Signs: BP (!) 101/41 (BP Location: Left Arm)   Pulse 79   Temp 98.5 F (36.9 C) (Axillary)   Resp 20   Ht 5' 5" (1.651 m)   Wt 122.5 kg   SpO2 94%   BMI 44.93 kg/m  SpO2: SpO2: 94 % O2 Device: O2 Device:  NRB O2 Flow Rate: O2 Flow Rate (L/min): 15 L/min  Intake/output summary:   Intake/Output Summary (Last 24 hours) at 06/20/2018 1434 Last data filed at 06/20/2018 1100 Gross per 24 hour  Intake 785.28 ml  Output 100 ml  Net 685.28 ml   LBM: Last BM Date: 06/18/18 Baseline Weight: Weight: 122.5 kg Most recent weight: Weight: 122.5 kg       Palliative Assessment/Data:      Patient Active Problem List   Diagnosis Date Noted  . Malignant ascites 06/20/2018  . SIRS (systemic inflammatory response syndrome) ([place]) 06/20/2018  . Abdominal mass, right lower  quadrant 06/19/2018  . Chronic respiratory failure with hypoxia (HCC) 06/19/2018  . Acute exacerbation of CHF (congestive heart failure) (HCC) 03/18/2018  . CHF exacerbation (HCC) 03/17/2018  . Buttock wound, right, initial encounter 03/17/2018  . Hypothyroidism 03/17/2018  . CKD (chronic kidney disease) stage 3, GFR 30-59 ml/min (HCC) 03/17/2018  . Decubitus ulcer of right buttock, stage 1   . Mild renal insufficiency 01/17/2018  . Acute on chronic respiratory failure with hypoxia (HCC) 01/17/2018  . Acute on chronic diastolic CHF (congestive heart failure) (HCC) 01/17/2018  . Bilateral lower extremity edema 07/17/2017  . 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 IDA (iron deficiency anemia)   . Leukocytosis 11/06/2014  . COPD (chronic obstructive pulmonary disease) (HCC) 06/05/2014  . Hypoxia 06/05/2014  . Iron deficiency anemia due to chronic blood loss 04/12/2014  . Postoperative MRSA infection of lower abdominal wound 03/22/2014  . Thrombocytosis (HCC) 03/20/2014  . Incisional hernia without mention of obstruction or gangrene 05/13/2013  . Cellulitis 03/14/2013  . Draining postoperative wound 01/27/2013  . S/P colon resection 06/27/2012  . Obesity, Class III, BMI 40-49.9 (morbid obesity) (HCC) 10/18/2011  . 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 Hypertension 10/09/2011  . Diabetes mellitus (HCC) 10/09/2011  . OBSTRUCTIVE SLEEP APNEA 03/10/2008    Palliative Care Assessment & Plan   Patient Profile: 77-year-old female with multiple comorbid conditions and new diagnosis of abdominal mass with retroperitoneal bleed and worsening kidney function  Recommendations/Plan:  Patient reports that her goal moving forward is to focus on comfort and dignity as she approaches the end of her life.  She understands that time is short with her newly diagnosed abdominal mass that is compromising her vasculature, retroperitoneal bleed, and worsening kidney function.  We discussed options for care with a focus on comfort including home hospice versus residential hospice facility.  She and her family are in agreement that she would best be served by transitioning to residential hospice if it can be arranged.  She [place] in [place] and would have preference for facility in [place].  Pain: Her pain remains uncontrolled in between doses of fentanyl.  She has been receiving 50 mcg every 2 hours for the last several hours.  We will plan for initiation of fentanyl infusion at 25 mcg/h.  She is going to continue the titration of her pain medication moving forward and this would best be accomplished at inpatient hospice facility.  Goals of Care and Additional Recommendations:  Limitations on Scope of Treatment: Full Comfort Care  Code Status:    Code Status Orders  (From admission, onward)         Start     Ordered   06/19/18 1513  Do not attempt resuscitation (DNR)  Continuous    Question Answer Comment  In the event of cardiac or respiratory ARREST Do not call a "code blue"   In the event of cardiac or respiratory ARREST Do not perform Intubation, CPR, defibrillation or ACLS   In the event of cardiac or respiratory ARREST Use medication by any route, position, wound care, and other measures to relive pain and suffering. May  use oxygen, suction and manual treatment of airway obstruction as needed for comfort.      06/19/18 1513        Code Status History    Date Active Date Inactive Code Status Order ID Comments User Context   03/17/2018 1559 03/18/2018 1933   Full Code [id number]  Kathie Dike, MD ED   01/17/2018 2051 01/23/2018 1611 Full Code [id number]  Vianne Bulls, MD ED   06/05/2014 2322 06/08/2014 1628 Full Code [id number]  Truett Mainland, DO Inpatient   06/26/2012 1702 07/02/2012 1503 Full Code [id number]  Carter Kitten, RN Inpatient   10/09/2011 2022 10/24/2011 1752 Full Code [id number]  Cleaster Corin, RN Inpatient   10/09/2011 1336 10/09/2011 2022 Full Code [id number]  Earle Gell, RN Inpatient       Prognosis:   < 2 weeks-she has newly diagnosed abdominal mass which is compromising vasculature and is further complicated by retroperitoneal bleed as well as renal failure with progressively worsening creatinine.  With a focus on comfort, I believe her prognosis is less than 2 weeks if her disease follows natural course.  She continues to have uncontrolled symptoms and would benefit from residential hospice if it can be arranged.  Discharge Planning:  Hospice facility  Care plan was discussed with patient, family, RN, Dr. Venetia Constable  Thank you for allowing the Palliative Medicine Team to assist in the care of this patient.   Total Time 50 Prolonged Time Billed No      Greater than 50%  of this time was spent counseling and coordinating care related to the above assessment and plan.  Micheline Rough, MD  Please contact Palliative Medicine Team phone at [phone] for questions and concerns.

## 2018-06-20 NOTE — Social Work (Signed)
CSW spoke with Micheline Rough, MD with PMT.  Pt and pt family would like pt to be referred to [facility]. Referral faxed, aware pt not managed currently but could potentially be ready for discharge tomorrow.  Alexander Mt, [place] Work [phone]

## 2018-06-20 NOTE — Progress Notes (Signed)
[facility] Progress Note     Subjective: CC-  Husband at bedside. Patient states that they just had a long discussion with Dr. Olevia Bowens and they plan to transition to full comfort care. States that her abdomen is still sore, but morphine is helping the pain.  Objective: Vital signs in last 24 hours: Temp:  [97.6 F (36.4 C)-98.5 F (36.9 C)] 98.5 F (36.9 C) (09/27 0425) Pulse Rate:  [79-99] 79 (09/27 0549) Resp:  [16-28] 20 (09/27 0425) BP: (89-124)/(41-79) 101/41 (09/27 0549) SpO2:  [91 %-100 %] 92 % (09/27 0549) Last BM Date: 06/18/18  Intake/Output from previous day: 09/26 0701 - 09/27 0700 In: 785.3 [P.O.:60; IV Piggyback:725.3] Out: 100 [Urine:100] Intake/Output this shift: No intake/output data recorded.  PE: Gen:  Alert, chronically ill appearing, pleasant HEENT: EOM's intact, pupils equal and round Card:  RRR Pulm:  Dyspneic on NRB Abd: obese, distended, +BS, TTP right hemiabdomen, cellulitis noted to pannus with intact eschars near umbilicus Ext:  Feet are cold with cyanosis of the toes, 1-2+ edema Psych: A&Ox3  Skin: sores/eschars noted to BUE, BLE, and trunk from scratching  Lab Results:  Recent Labs    06/19/18 1658 06/20/18 0640  WBC 20.6* 22.4*  HGB 14.4 12.9  HCT 48.6* 44.5  PLT 562* 526*   BMET Recent Labs    06/19/18 1658 06/20/18 0640  NA 136 136  K 5.7* 5.3*  CL 95* 94*  CO2 25 27  GLUCOSE 175* 168*  BUN 45* 53*  CREATININE 2.49* 3.21*  CALCIUM 9.3 9.1   PT/INR Recent Labs    06/19/18 1658  LABPROT 13.8  INR 1.07   CMP     Component Value Date/Time   NA 136 06/20/2018 0640   K 5.3 (H) 06/20/2018 0640   CL 94 (L) 06/20/2018 0640   CO2 27 06/20/2018 0640   GLUCOSE 168 (H) 06/20/2018 0640   BUN 53 (H) 06/20/2018 0640   CREATININE 3.21 (H) 06/20/2018 0640   CALCIUM 9.1 06/20/2018 0640   PROT 7.0 06/20/2018 0640   ALBUMIN 3.1 (L) 06/20/2018 0640   AST 19 06/20/2018 0640   ALT 10 06/20/2018 0640   ALKPHOS  138 (H) 06/20/2018 0640   BILITOT 0.9 06/20/2018 0640   GFRNONAA 13 (L) 06/20/2018 0640   GFRAA 15 (L) 06/20/2018 0640   Lipase     Component Value Date/Time   LIPASE 79 (H) 06/19/2018 0629       Studies/Results: Ct Abdomen Pelvis Wo Contrast  Result Date: 06/19/2018 CLINICAL DATA:  Abdominal swelling and right-sided pain. EXAM: CT ABDOMEN AND PELVIS WITHOUT CONTRAST TECHNIQUE: Multidetector CT imaging of the abdomen and pelvis was performed following the standard protocol without IV contrast. COMPARISON:  01/15/2013 FINDINGS: Lower chest: Small pleural effusions are identified with overlying atelectasis/consolidation. Hepatobiliary: No focal liver abnormality. Stone within the gallbladder neck measures 1.1 cm, image 26/2. No biliary ductal dilatation. Pancreas: Multiple calcifications are identified throughout the pancreas compatible with chronic pancreatitis. No inflammation or mass noted. Spleen: Spleen is unremarkable. Adrenals/Urinary Tract: Normal appearance of the adrenal glands. Several hyperdense lesions are identified within the left kidney. The largest is in the upper pole measuring 3.5 cm and appears new from comparison exam. No hydronephrosis identified bilaterally.Urinary bladder appears collapsed. Stomach/Bowel: The stomach appears nondilated. Small and large bowel loops have a normal course and caliber. Vascular/Lymphatic: Aortic atherosclerosis. No aneurysm. No retroperitoneal adenopathy. No iliac adenopathy. Right inguinal lymph node is enlarged measuring 1.4 cm. Adjacent lymph node measures 1.2 cm.  Reproductive: The uterus appears surgically absent. Is the ovaries are not confidently identified on this exam. Other: There is a moderate volume of ascites within the peritoneal cavity which within the limitations of unenhanced technique appears at least partially loculated. Fluid extends scratch set perihepatic and perisplenic ascites is noted. There is diffuse peritoneal nodularity  identified within the abdomen and pelvis as well as soft tissue infiltration throughout the omentum compatible with omental caking. This is new compared with the previous exam. Within the right lower quadrant of the abdomen there is a large solid and cystic mass measuring 16.8 x 11.7 by 16.8 cm. There is high attenuation material extending into the right retroperitoneum worrisome for hematoma. There is a left lower quadrant hernia containing low-attenuation fluid measuring approximately 4 cm, image 81/2. Periumbilical hernia, eccentric to the left, is also noted containing fat, fluid and soft tissue. There is surrounding marked skin thickening and ulceration identified. Musculoskeletal: There is marked skin thickening overlying the pannus. Diffuse subcutaneous fat stranding throughout the pannus is also noted compatible with cellulitis. IMPRESSION: 1. There is a large solid and cystic mass identified within the right lower quadrant of the abdomen. Primary concern is for malignancy. Additionally, since the previous exam there has been interval development of diffuse peritoneal nodularity, and infiltration of the omentum, worrisome for diffuse peritoneal carcinomatosis. 2. High attenuation material extending into the right retroperitoneum is identified, concerning for retroperitoneal hematoma. 3. There is marked cellulitic changes involving the patient's pannus. 4. Periumbilical hernia is identified with surrounding skin thickening and ulceration. Cannot exclude underlying subcutaneous abscess. 5. Gallstone. Electronically Signed   By: Kerby Moors M.D.   On: 06/19/2018 10:19   Dg Chest Portable 1 View  Result Date: 06/19/2018 CLINICAL DATA:  Initial evaluation for acute shortness of breath. EXAM: PORTABLE CHEST 1 VIEW COMPARISON:  Prior radiograph from 03/17/2018. FINDINGS: Cardiomegaly, grossly stable from previous. Mediastinal silhouette within normal limits. Aortic atherosclerosis. Lungs are hypoinflated.  Diffuse vascular congestion with interstitial prominence, suggesting diffuse pulmonary interstitial edema. Superimposed bibasilar opacities favored to reflect atelectasis. Infiltrates not excluded, and could be considered in the correct clinical setting. No obvious large pleural effusion. No pneumothorax. No acute osseus abnormality. IMPRESSION: 1. Cardiomegaly with diffuse vascular congestion and interstitial prominence, suggesting mild pulmonary interstitial edema. 2. Low lung volumes with superimposed bibasilar opacities, likely atelectasis. Electronically Signed   By: Jeannine Boga M.D.   On: 06/19/2018 06:48    Anti-infectives: Anti-infectives (From admission, onward)   Start     Dose/Rate Route Frequency Ordered Stop   06/19/18 1600  cefTRIAXone (ROCEPHIN) 2 g in sodium chloride 0.9 % 100 mL IVPB  Status:  Discontinued     2 g 200 mL/hr over 30 Minutes Intravenous Every 24 hours 06/19/18 1513 06/19/18 1531   06/19/18 1600  metroNIDAZOLE (FLAGYL) IVPB 500 mg  Status:  Discontinued     500 mg 100 mL/hr over 60 Minutes Intravenous Every 8 hours 06/19/18 1513 06/19/18 1531   06/19/18 1545  doxycycline (VIBRAMYCIN) 100 mg in sodium chloride 0.9 % 250 mL IVPB     100 mg 125 mL/hr over 120 Minutes Intravenous Every 12 hours 06/19/18 1533     06/19/18 1545  meropenem (MERREM) 1 g in sodium chloride 0.9 % 100 mL IVPB  Status:  Discontinued     1 g 200 mL/hr over 30 Minutes Intravenous Every 12 hours 06/19/18 1541 06/20/18 0920   06/19/18 0715  cefTRIAXone (ROCEPHIN) 1 g in sodium chloride 0.9 % 100 mL  IVPB     1 g 200 mL/hr over 30 Minutes Intravenous  Once 06/19/18 0711 06/19/18 0849   06/19/18 0715  metroNIDAZOLE (FLAGYL) IVPB 500 mg     500 mg 100 mL/hr over 60 Minutes Intravenous  Once 06/19/18 0711 06/19/18 1028       Assessment/Plan DM HTN COPD, on home O2, 3L CHF OSA Morbid obesity  Abdominal pain secondary to intra-abdominal mass Abdominal wall cellulitis - Patient  has transitioned to full comfort care.  General surgery will sign off, please call with concerns.   LOS: 1 day    Wellington Hampshire , [facility] 06/20/2018, 9:31 AM Pager: [phone] Consults: [phone] Mon 7:00 am -11:30 AM Tues-Fri 7:00 am-4:30 pm Sat-Sun 7:00 am-11:30 am

## 2018-06-20 NOTE — Consult Note (Signed)
Consultation Note Date: 06/20/2018   Patient Name: [name]  DOB: [birth year]  MRN: [id number]  Age / Sex: 77 y.o., female  PCP: Sharilyn Sites, MD Referring Physician: Aileen Fass, Tammi Klippel, MD  Reason for Consultation: Establishing goals of care, Non pain symptom management and Pain control  HPI/Patient Profile: 77 y.o. female  with past medical history of CHF, COPD, respiratory failure, CKD, obesity admitted on 06/19/2018 with abdominal pain and found to have new abdominal mass.  She is also been evaluated for lower limb ischemia.  I have consulted for goals of care.  Clinical Assessment and Goals of Care:  I met this evening with [name] and her family.  Family reports being updated by doctors throughout the day today and understanding that "time is short."  I attempted to discuss with her regarding her short and long-term goals of care.  Unfortunately, she is in a great deal of pain at this point in time and states that she is not really able to converse about any long-term goals with me this evening.  We therefore discussed her main concerns of abdominal pain as well as shortness of breath.  She was in agreement with plan to focus on symptom management and meeting again tomorrow to discuss long-term goals of care.  I then met with her family outside of the room, including her husband and nieces.  We discussed that she has newly diagnosed abdominal mass, retroperitoneal bleed, and kidney injury.  Her family does not want her to suffer if we are approaching end-of-life and is clear that her desire would be to make sure that she receives medications as necessary even if it were to cause unwanted side effects such as respiratory depression or hypotension.   SUMMARY OF RECOMMENDATIONS   - [name] reports being "put through the ringer" and not wanting to discuss further this evening.  She continues to have  significant pain and also reports SOB.  Plan for more aggressive management of her symptoms tonight and reassess her overall goals of care tomorrow when she is more comfortable.  Code Status/Advance Care Planning:  DNR   Symptom Management:  Pain: Discussed risk vs benefit of increasing opioid dosing, and she and family are in agreement that pain medication should be used as needed for pain regardless of concern for side effects to ensure she is not suffering.  Increased fentanyl to 25-4mg every 2 hours as needed.   Psycho-social/Spiritual:   Desire for further Chaplaincy support:no  Additional Recommendations: Education on Hospice  Prognosis:   < 2 weeks  Discharge Planning: To Be Determined      Primary Diagnoses: Present on Admission: . Abdominal mass, right lower quadrant . Obesity, Class III, BMI 40-49.9 (morbid obesity) ([place] . Iron deficiency anemia due to chronic blood loss . IDA (iron deficiency anemia) . CKD (chronic kidney disease) stage 3, GFR 30-59 ml/min (HCC) . Acute on chronic respiratory failure with hypoxia (HCC)   I have reviewed the medical record, interviewed the patient and family, and examined  the patient. The following aspects are pertinent.  Past Medical History:  Diagnosis Date  . Anemia   . Arthritis   . Bronchitis 05/2014  . Cellulitis of lower extremity 06/2014   bilateral  . CHF (congestive heart failure) ([place])   . COPD (chronic obstructive pulmonary disease) ([place])   . Diabetes mellitus   . Diverticulitis   . Gallstone   . Hypertension   . MRSA (methicillin resistant Staphylococcus aureus)    has now "tested negative"- no open wounds as of 02-10-15  . Obesity   . On home O2   . Shortness of breath    with exertion   . Sleep apnea    cpap at 10   . Sleep apnea   . Thyroid disease   . Tubular adenoma of colon    Social History   Socioeconomic History  . Marital status: Married    Spouse name: Not on file  . Number of  children: 0  . Years of education: Not on file  . Highest education level: Not on file  Occupational History  . Occupation: Retired  Scientific laboratory technician  . Financial resource strain: Not on file  . Food insecurity:    Worry: Not on file    Inability: Not on file  . Transportation needs:    Medical: Not on file    Non-medical: Not on file  Tobacco Use  . Smoking status: Former Smoker    Last attempt to quit: 09/24/2010    Years since quitting: 7.7  . Smokeless tobacco: Never Used  Substance and Sexual Activity  . Alcohol use: No    Alcohol/week: 0.0 standard drinks  . Drug use: No  . Sexual activity: Not Currently    Birth control/protection: Surgical  Lifestyle  . Physical activity:    Days per week: Not on file    Minutes per session: Not on file  . Stress: Not on file  Relationships  . Social connections:    Talks on phone: Not on file    Gets together: Not on file    Attends religious service: Not on file    Active member of club or organization: Not on file    Attends meetings of clubs or organizations: Not on file    Relationship status: Not on file  Other Topics Concern  . Not on file  Social History Narrative  . Not on file   Family History  Problem Relation Age of Onset  . Heart disease Father   . Diabetes Father   . Hypertension Father   . Diabetes Mother   . Hypertension Mother   . Heart disease Brother   . Diabetes Brother   . Diabetes Sister   . Colon cancer Neg Hx   . Colon polyps Neg Hx   . Esophageal cancer Neg Hx   . Kidney disease Neg Hx   . Gallbladder disease Neg Hx    Scheduled Meds: Continuous Infusions: . fentaNYL infusion INTRAVENOUS 25 mcg/hr (06/20/18 1344)   PRN Meds:.albuterol, diphenhydrAMINE, fentaNYL, fentaNYL (SUBLIMAZE) injection, HYDROmorphone (DILAUDID) injection Medications Prior to Admission:  Prior to Admission medications   Medication Sig Start Date End Date Taking? Authorizing Provider  aspirin 81 MG tablet Take 81 mg by  mouth at bedtime.    Yes [provider]  budesonide-formoterol (SYMBICORT) 160-4.5 MCG/ACT inhaler Inhale 2 puffs into the lungs 2 (two) times daily.   Yes [provider]  Calcium Carb-Cholecalciferol (CALCIUM 600 + D PO) Take 2 tablets by  mouth daily.   Yes [provider]  diphenhydrAMINE (BENADRYL) 25 mg capsule Take 25 mg by mouth every 6 (six) hours as needed for itching.   Yes [provider]  ipratropium-albuterol (DUONEB) 0.5-2.5 (3) MG/3ML SOLN Take 3 mLs by nebulization every 4 (four) hours as needed (for wheezing or shortness of breath).  01/10/18  Yes [provider]  JANUVIA 100 MG tablet Take 100 mg by mouth daily.  11/11/14  Yes [provider]  levothyroxine (SYNTHROID, LEVOTHROID) 50 MCG tablet Take 50 mcg by mouth daily. 08/04/15  Yes [provider]  metFORMIN (GLUCOPHAGE) 500 MG tablet Take 1,000 mg by mouth 2 (two) times daily with a meal.    Yes [provider]  metolazone (ZAROXOLYN) 2.5 MG tablet Take 1 tablet (2.5 mg total) by mouth as needed. 05/16/18 08/14/18 Yes Bensimhon, Shaune Pascal, MD  montelukast (SINGULAIR) 10 MG tablet Take 10 mg by mouth daily.    Yes [provider]  polyethylene glycol (MIRALAX / GLYCOLAX) packet Take 17 g by mouth daily with lunch.    Yes [provider]  potassium chloride SA (K-DUR,KLOR-CON) 20 MEQ tablet Take 1 tablet (20 mEq total) by mouth daily. 05/16/18  Yes Bensimhon, Shaune Pascal, MD  torsemide (DEMADEX) 20 MG tablet Take 3 tablets (60 mg total) by mouth 2 (two) times daily. 05/27/18 08/25/18 Yes Bensimhon, Shaune Pascal, MD  traMADol (ULTRAM) 50 MG tablet Take 50 mg by mouth every 4 (four) hours as needed (for pain).  01/09/12  Yes [provider]  Wheat Dextrin (BENEFIBER) POWD Take 15 g by mouth daily.    Yes [provider]  betamethasone dipropionate (DIPROLENE) 0.05 % cream  05/31/18   [provider]  doxycycline (VIBRA-TABS) 100 MG  tablet Take 1 tablet (100 mg total) by mouth 2 (two) times daily. Take until bottle empty. Take with food. Patient not taking: Reported on 06/19/2018 05/29/18   Georgiana Shore, NP  fluconazole (DIFLUCAN) 150 MG tablet Take 150 mg by mouth daily. TAKING 1 [place]    [provider]  Glucosamine-Chondroit-Vit C-Mn (GLUCOSAMINE 1500 COMPLEX PO) Take 2 tablets by mouth daily.    [provider]  mupirocin cream (BACTROBAN) 2 % Apply to the right buttock wound twice a day. 03/18/18   Johnson, Clanford L, MD  nystatin (MYCOSTATIN/NYSTOP) powder Apply topically 2 (two) times daily. 03/18/18   Murlean Iba, MD   Allergies  Allergen Reactions  . Adhesive [Tape] Hives and Rash  . Codeine Itching and Other (See Comments)  . Lovenox [Enoxaparin Sodium]     Local rash May 2019 w/ppx dosing Has tolerated SQ heparin in the past  . Vancomycin Itching and Rash  . Zosyn [Piperacillin Sod-Tazobactam So] Itching and Rash   Review of Systems Pt in distress.  Confirms Pain and SOB.  Physical Exam General: Alert, awake, in moderate distress.  Heart: Regular rate and rhythm. No murmur appreciated. Lungs: Diminished air movement, clear Abdomen: distended, tender to palpation, positive bowel sounds.  Ext: + edema Skin: Warm and dry Neuro: Grossly intact, nonfocal.  Vital Signs: BP (!) 101/41 (BP Location: Left Arm)   Pulse 79   Temp 98.5 F (36.9 C) (Axillary)   Resp 20   Ht '5\' 5"'  (1.651 m)   Wt 122.5 kg   SpO2 94%   BMI 44.93 kg/m  Pain Scale: 0-10   Pain Score: 9    SpO2: SpO2: 94 % O2 Device:SpO2: 94 % O2 Flow  Rate: .O2 Flow Rate (L/min): 15 L/min  IO: Intake/output summary:   Intake/Output Summary (Last 24 hours) at 06/20/2018 1442 Last data filed at 06/20/2018 1100 Gross per 24 hour  Intake 785.28 ml  Output 100 ml  Net 685.28 ml    LBM: Last BM Date: 06/18/18 Baseline Weight: Weight: 122.5 kg Most recent weight: Weight: 122.5 kg     Palliative  Assessment/Data:    Time Total: 60 Greater than 50%  of this time was spent counseling and coordinating care related to the above assessment and plan.  Signed by: Micheline Rough, MD   Please contact Palliative Medicine Team phone at [phone] for questions and concerns.  For individual provider: See Shea Evans

## 2018-06-20 NOTE — Progress Notes (Signed)
Went to patient's room to start Duoneb tx. RN had started a PRN Albuterol neb due to patient being very short of breath with elevated RR. After neb placed back on NRB. Patient continues to complain of shortness of breath.

## 2018-06-21 MED ORDER — CALCIUM CARBONATE ANTACID 500 MG PO CHEW: 2.0000 | CHEWABLE_TABLET | Freq: Three times a day (TID) | ORAL | Status: AC

## 2018-06-21 MED ORDER — CALCIUM CARBONATE ANTACID 500 MG PO CHEW
2.0000 | CHEWABLE_TABLET | Freq: Every day | ORAL | Status: DC | PRN
  Administered 2018-06-21: 400 mg via ORAL

## 2018-06-21 MED ORDER — FENTANYL BOLUS VIA INFUSION: 50.0000 ug | INTRAVENOUS | 0 refills | Status: AC | PRN

## 2018-06-21 MED ORDER — PANTOPRAZOLE SODIUM 40 MG PO TBEC
40.0000 mg | DELAYED_RELEASE_TABLET | Freq: Two times a day (BID) | ORAL | Status: DC
  Administered 2018-06-21: 40 mg via ORAL
  Filled 2018-06-21: qty 1

## 2018-06-21 MED ORDER — FENTANYL CITRATE (PF) 100 MCG/2ML IJ SOLN: 50.0000 ug | INTRAMUSCULAR | 0 refills | Status: AC | PRN

## 2018-06-21 NOTE — Progress Notes (Signed)
Faxed d/c surrmary to [facility]. Awaiting call from hospice to confirm discharge and set transportation.

## 2018-06-21 NOTE — Progress Notes (Signed)
Patient to be transported to facility with her foley catheter .Fentanyl drip dc'd , will waste 100 cc in the black box.

## 2018-06-21 NOTE — Discharge Summary (Signed)
Physician Discharge Summary  [name]:[id number] DOB: [birth year] DOA: 06/19/2018  PCP: Sharilyn Sites, MD  Admit date: 06/19/2018 Discharge date: 06/21/2018  Admitted From: Home Disposition:  [facility]idential [place] [facility]  Recommendations for Outpatient Follow-up:  1. Follow up with PCP in 1-2 weeks 2. Please obtain BMP/CBC in one week   Home Health:No Equipment/Devices: Bipap Discharge Condition:Guarded CODE STATUS:DNR Diet recommendation: Comfort feeds  Brief/Interim Summary: 77 y.o. female past medical history of diabetes mellitus type 2, chronic kidney disease stage III, COPD chronic respiratory failure on 3 L of oxygen cor pulmonale chronic diastolic heart failure presents with complaints of abdominal pain was found to have a large abdominal mass and omental caking along with ascites and retroperitoneal hematoma  Discharge Diagnoses:  Active Problems:   Obesity, Class III, BMI 40-49.9 (morbid obesity) (HCC)   Iron deficiency anemia due to chronic blood loss   IDA (iron deficiency anemia)   Acute on chronic respiratory failure with hypoxia (HCC)   CKD (chronic kidney disease) stage 3, GFR 30-59 ml/min (HCC)   Abdominal mass, right lower quadrant   Chronic respiratory failure with hypoxia (HCC)   Malignant ascites   SIRS (systemic inflammatory response syndrome) (HCC)  Abdominal mass in the right lower quadrant/with malignant ascites and Sirs: CT scan of the abdomen and pelvis on 06/19/2018 showed right lower quadrant solid mass with diffuse peritoneal carcinomatosis and moderate ascites with probable omental caking and involvement of the left kidney. Surgery was consulted and deemed the patient not a surgical candidate.  We had a long discussion with the patient and her husband and they decided to move towards comfort care due to her multiple comorbidities and poor outcome. They decided to go to residential hospice facility at [facility].  Acute respiratory failure with  hypoxia due to significant abdominal distention: She was placed on run nonrebreather satting 85 to 90% morphine was used for comfort. She will go to residential hospital and a fentanyl drip.  Acute kidney injury in the setting of chronic renal disease: Likely post renal, despite IV fluid hydration her creatinine worsened.  This was explained to the patient and she seems to understand.  Retroperitoneal hematoma: Seen on CT scan of the abdomen and pelvis her hemoglobin remained stable.  Abdominal wall lesions: They were nontender non-erythematous and hard to examination likely metastatic disease.  Cyanotic toes: ABIs were performed that showed no circulation: Vascular surgery was consulted who deemed her not a candidate for surgical intervention due to multiple comorbidities.  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  This was explained to the patient that she was not a surgical candidate for intervention, and not even for biopsy.  I have explained this to the patient and the husband and they would like to transition to comfort care.  Discharge Instructions  Discharge Instructions    Diet - low sodium heart healthy   Complete by:  As directed    Increase activity slowly   Complete by:  As directed      Allergies as of 06/21/2018      Reactions   Adhesive [tape] Hives, Rash   Codeine Itching, Other (See Comments)   Lovenox [enoxaparin Sodium]    Local rash May  2019 w/ppx dosing Has tolerated SQ heparin in the past   Vancomycin Itching, Rash   Zosyn [piperacillin Sod-tazobactam So] Itching, Rash       Medication List    STOP taking these medications   aspirin 81 MG tablet   BENEFIBER Powd   betamethasone dipropionate 0.05 % cream Commonly known as:  DIPROLENE   CALCIUM 600 + D PO   diphenhydrAMINE 25 mg capsule Commonly known as:  BENADRYL   doxycycline 100 MG tablet Commonly known as:  VIBRA-TABS   fluconazole 150 MG tablet Commonly known as:  DIFLUCAN   GLUCOSAMINE 1500 COMPLEX PO   JANUVIA 100 MG tablet Generic drug:  sitaGLIPtin   levothyroxine 50 MCG tablet Commonly known as:  SYNTHROID, LEVOTHROID   metFORMIN 500 MG tablet Commonly known as:  GLUCOPHAGE   metolazone 2.5 MG tablet Commonly known as:  ZAROXOLYN   montelukast 10 MG tablet Commonly known as:  SINGULAIR   mupirocin cream 2 % Commonly known as:  BACTROBAN   nystatin powder Commonly known as:  MYCOSTATIN/NYSTOP   potassium chloride SA 20 MEQ tablet Commonly known as:  K-DUR,KLOR-CON   torsemide 20 MG tablet Commonly known as:  DEMADEX   traMADol 50 MG tablet Commonly known as:  ULTRAM     TAKE these medications   budesonide-formoterol 160-4.5 MCG/ACT inhaler Commonly known as:  SYMBICORT Inhale 2 puffs into the lungs 2 (two) times daily.   calcium carbonate 500 MG chewable tablet Commonly known as:  TUMS - dosed in mg elemental calcium Chew 2 tablets (400 mg of elemental calcium total) by mouth 3 (three) times daily with meals.   fentaNYL Soln Commonly known as:  SUBLIMAZE Inject 50 mcg into the vein every 30 (thirty) minutes as needed.   fentaNYL 100 MCG/2ML injection Commonly known as:  SUBLIMAZE Inject 1 mL (50 mcg total) into the vein every 2 (two) hours as needed for severe pain.   ipratropium-albuterol 0.5-2.5 (3) MG/3ML Soln Commonly known as:  DUONEB Take 3 mLs by nebulization every 4 (four) hours as needed (for wheezing or shortness of breath).   polyethylene glycol packet Commonly known as:  MIRALAX / GLYCOLAX Take 17 g by mouth daily with lunch.        Allergies  Allergen Reactions  . Adhesive [Tape] Hives and Rash  . Codeine Itching and Other (See Comments)  . Lovenox [Enoxaparin Sodium]     Local rash May 2019 w/ppx dosing Has tolerated SQ heparin in the past  . Vancomycin Itching and Rash  . Zosyn [Piperacillin Sod-Tazobactam So] Itching and Rash    Consultations: Vascular surgery General surgery PMT  Procedures/Studies: Ct Abdomen Pelvis Wo Contrast  Result Date: 06/19/2018 CLINICAL DATA:  Abdominal swelling and right-sided pain. EXAM: CT ABDOMEN AND PELVIS WITHOUT CONTRAST TECHNIQUE: Multidetector CT imaging of the abdomen and pelvis was performed following the standard protocol without IV contrast. COMPARISON:  01/15/2013 FINDINGS: Lower chest: Small pleural effusions are identified with overlying atelectasis/consolidation. Hepatobiliary: No focal liver abnormality. Stone within the gallbladder neck measures 1.1 cm, image 26/2. No biliary ductal dilatation. Pancreas: Multiple calcifications are identified throughout the pancreas compatible with chronic pancreatitis. No inflammation or mass noted. Spleen: Spleen is unremarkable. Adrenals/Urinary Tract: Normal appearance of the adrenal glands. Several hyperdense lesions are identified within the left kidney. The largest is in the upper pole measuring 3.5 cm and appears new from comparison exam. No hydronephrosis identified bilaterally.Urinary bladder appears collapsed. Stomach/Bowel: The stomach appears nondilated. Small and large bowel loops have  a normal course and caliber. Vascular/Lymphatic: Aortic atherosclerosis. No aneurysm. No retroperitoneal adenopathy. No iliac adenopathy. Right inguinal lymph node is enlarged measuring 1.4 cm. Adjacent lymph node measures 1.2 cm. Reproductive: The uterus appears surgically absent. Is the ovaries are not confidently identified on this exam. Other: There is a moderate volume of ascites within the peritoneal cavity which within the limitations  of unenhanced technique appears at least partially loculated. Fluid extends scratch set perihepatic and perisplenic ascites is noted. There is diffuse peritoneal nodularity identified within the abdomen and pelvis as well as soft tissue infiltration throughout the omentum compatible with omental caking. This is new compared with the previous exam. Within the right lower quadrant of the abdomen there is a large solid and cystic mass measuring 16.8 x 11.7 by 16.8 cm. There is high attenuation material extending into the right retroperitoneum worrisome for hematoma. There is a left lower quadrant hernia containing low-attenuation fluid measuring approximately 4 cm, image 81/2. Periumbilical hernia, eccentric to the left, is also noted containing fat, fluid and soft tissue. There is surrounding marked skin thickening and ulceration identified. Musculoskeletal: There is marked skin thickening overlying the pannus. Diffuse subcutaneous fat stranding throughout the pannus is also noted compatible with cellulitis. IMPRESSION: 1. There is a large solid and cystic mass identified within the right lower quadrant of the abdomen. Primary concern is for malignancy. Additionally, since the previous exam there has been interval development of diffuse peritoneal nodularity, and infiltration of the omentum, worrisome for diffuse peritoneal carcinomatosis. 2. High attenuation material extending into the right retroperitoneum is identified, concerning for retroperitoneal hematoma. 3. There is marked cellulitic changes involving the patient's pannus. 4. Periumbilical hernia is identified with surrounding skin thickening and ulceration. Cannot exclude underlying subcutaneous abscess. 5. Gallstone. Electronically Signed   By: Kerby Moors M.D.   On: 06/19/2018 10:19   Dg Chest Portable 1 View  Result Date: 06/19/2018 CLINICAL DATA:  Initial evaluation for acute shortness of breath. EXAM: PORTABLE CHEST 1 VIEW COMPARISON:  Prior  radiograph from 03/17/2018. FINDINGS: Cardiomegaly, grossly stable from previous. Mediastinal silhouette within normal limits. Aortic atherosclerosis. Lungs are hypoinflated. Diffuse vascular congestion with interstitial prominence, suggesting diffuse pulmonary interstitial edema. Superimposed bibasilar opacities favored to reflect atelectasis. Infiltrates not excluded, and could be considered in the correct clinical setting. No obvious large pleural effusion. No pneumothorax. No acute osseus abnormality. IMPRESSION: 1. Cardiomegaly with diffuse vascular congestion and interstitial prominence, suggesting mild pulmonary interstitial edema. 2. Low lung volumes with superimposed bibasilar opacities, likely atelectasis. Electronically Signed   By: Jeannine Boga M.D.   On: 06/19/2018 06:48     Subjective: Pain controllled.  Discharge Exam: Vitals:   06/20/18 0549 06/20/18 1142  BP: (!) 101/41   Pulse: 79   Resp:    Temp:    SpO2: 92% 94%   Vitals:   06/20/18 0108 06/20/18 0425 06/20/18 0549 06/20/18 1142  BP:  100/69 (!) 101/41   Pulse:  86 79   Resp:  20    Temp:  98.5 F (36.9 C)    TempSrc:  Axillary    SpO2: 92% 96% 92% 94%  Weight:      Height:        General: Pt is alert, awake, not in acute distress Cardiovascular: RRR, S1/S2 +, no rubs, no gallops Respiratory: CTA bilaterally, no wheezing, no rhonchi Abdominal: Soft, NT, ND, bowel sounds + Extremities: no edema, no cyanosis    The results of significant diagnostics from this hospitalization (including  imaging, microbiology, ancillary and laboratory) are listed below for reference.     Microbiology: Recent Results (from the past 240 hour(s))  Urine Culture     Status: Abnormal   Collection Time: 06/19/18  6:07 AM  Result Value Ref Range Status   Specimen Description   Final    URINE, CLEAN CATCH Performed at [facility], [address], [place], [place] [address]    Special Requests   Final     NONE Performed at [facility], [address] [place], [place] [address]    Culture (A)  Final    60,000 COLONIES/mL GROUP B STREP(S.AGALACTIAE)ISOLATED TESTING AGAINST S. AGALACTIAE NOT ROUTINELY PERFORMED DUE TO PREDICTABILITY OF AMP/PEN/VAN SUSCEPTIBILITY. Performed at [facility], [place] [address] [place], [place] [address]    Report Status 06/20/2018 FINAL  Final  Blood culture (routine x 2)     Status: None (Preliminary result)   Collection Time: 06/19/18  7:30 AM  Result Value Ref Range Status   Specimen Description BLOOD RIGHT HAND  Final   Special Requests   Final    BOTTLES DRAWN AEROBIC AND ANAEROBIC Blood Culture adequate volume   Culture   Final    NO GROWTH 2 DAYS Performed at [facility], [address] [place], [place] [address]    Report Status PENDING  Incomplete  Blood culture (routine x 2)     Status: None (Preliminary result)   Collection Time: 06/19/18  7:39 AM  Result Value Ref Range Status   Specimen Description BLOOD  Final   Special Requests   Final    BOTTLES DRAWN AEROBIC AND ANAEROBIC Blood Culture adequate volume   Culture   Final    NO GROWTH 2 DAYS Performed at [facility], [address] [place], [place] [address]    Report Status PENDING  Incomplete  MRSA PCR Screening     Status: None   Collection Time: 06/20/18  4:55 AM  Result Value Ref Range Status   MRSA by PCR NEGATIVE NEGATIVE Final    Comment:        The GeneXpert MRSA Assay (FDA approved for NASAL specimens only), is one component of a comprehensive MRSA colonization surveillance program. It is not intended to diagnose MRSA infection nor to guide or monitor treatment for MRSA infections. Performed at [facility], [place] [address], [place],  [address]      Labs: BNP (last 3 results) Recent Labs    01/17/18 1738 03/17/18 0943 06/19/18 0629  BNP 192.6* 94.0 08.6   Basic Metabolic Panel: Recent Labs  Lab 06/19/18 0629 06/19/18 1658 06/20/18 0640   NA 132* 136 136  K 5.0 5.7* 5.3*  CL 94* 95* 94*  CO2 25 25 27   GLUCOSE 174* 175* 168*  BUN 44* 45* 53*  CREATININE 1.85* 2.49* 3.21*  CALCIUM 9.3 9.3 9.1   Liver Function Tests: Recent Labs  Lab 06/19/18 0629 06/19/18 1658 06/20/18 0640  AST 19 18 19   ALT 12 11 10   ALKPHOS 102 123 138*  BILITOT 1.1 1.0 0.9  PROT 7.5 5.7* 7.0  ALBUMIN 3.5 3.3* 3.1*   Recent Labs  Lab 06/19/18 0629  LIPASE 79*   No results for input(s): AMMONIA in the last 168 hours. CBC: Recent Labs  Lab 06/19/18 0629 06/19/18 1658 06/20/18 0640  WBC 17.8* 20.6* 22.4*  NEUTROABS 14.6* 17.3*  --   HGB 15.1* 14.4 12.9  HCT 48.3* 48.6* 44.5  MCV 76.4* 77.5* 79.5  PLT  549* 562* 526*   Cardiac Enzymes: Recent Labs  Lab 06/19/18 0629 06/19/18 1658  CKTOTAL  --  26*  TROPONINI <0.03  --    BNP: Invalid input(s): POCBNP CBG: Recent Labs  Lab 06/19/18 1635 06/19/18 2008 06/20/18 0019 06/20/18 0421 06/20/18 0804  GLUCAP 157* 145* 168* 154* 144*   D-Dimer No results for input(s): DDIMER in the last 72 hours. Hgb A1c Recent Labs    06/19/18 1658  HGBA1C 6.4*   Lipid Profile No results for input(s): CHOL, HDL, LDLCALC, TRIG, CHOLHDL, LDLDIRECT in the last 72 hours. Thyroid function studies No results for input(s): TSH, T4TOTAL, T3FREE, THYROIDAB in the last 72 hours.  Invalid input(s): FREET3 Anemia work up Recent Labs    06/19/18 1658  RETICCTPCT 1.4   Urinalysis    Component Value Date/Time   COLORURINE AMBER (A) 06/19/2018 0607   APPEARANCEUR HAZY (A) 06/19/2018 0607   LABSPEC 1.023 06/19/2018 0607   PHURINE 5.0 06/19/2018 0607   GLUCOSEU NEGATIVE 06/19/2018 0607   HGBUR NEGATIVE 06/19/2018 [place] NEGATIVE 06/19/2018 0607   KETONESUR NEGATIVE 06/19/2018 0607   PROTEINUR 30 (A) 06/19/2018 0607   UROBILINOGEN 0.2 06/25/2014 1823   NITRITE NEGATIVE 06/19/2018 0607   LEUKOCYTESUR NEGATIVE 06/19/2018 0607   Sepsis Labs Invalid input(s): PROCALCITONIN,   WBC,  LACTICIDVEN Microbiology Recent Results (from the past 240 hour(s))  Urine Culture     Status: Abnormal   Collection Time: 06/19/18  6:07 AM  Result Value Ref Range Status   Specimen Description   Final    URINE, CLEAN CATCH Performed at [facility], [address], [place], [place] [address]    Special Requests   Final    NONE Performed at [facility], [address], [place], [place] [address]    Culture (A)  Final    60,000 COLONIES/mL GROUP B STREP(S.AGALACTIAE)ISOLATED TESTING AGAINST S. AGALACTIAE NOT ROUTINELY PERFORMED DUE TO PREDICTABILITY OF AMP/PEN/VAN SUSCEPTIBILITY. Performed at [facility], [place] [address] [place], [place] [address]    Report Status 06/20/2018 FINAL  Final  Blood culture (routine x 2)     Status: None (Preliminary result)   Collection Time: 06/19/18  7:30 AM  Result Value Ref Range Status   Specimen Description BLOOD RIGHT HAND  Final   Special Requests   Final    BOTTLES DRAWN AEROBIC AND ANAEROBIC Blood Culture adequate volume   Culture   Final    NO GROWTH 2 DAYS Performed at [facility], [address] [place], [place] [address]    Report Status PENDING  Incomplete  Blood culture (routine x 2)     Status: None (Preliminary result)   Collection Time: 06/19/18  7:39 AM  Result Value Ref Range Status   Specimen Description BLOOD  Final   Special Requests   Final    BOTTLES DRAWN AEROBIC AND ANAEROBIC Blood Culture adequate volume   Culture   Final    NO GROWTH 2 DAYS Performed at [facility], [address], [place], [place] [address]    Report Status PENDING  Incomplete  MRSA PCR Screening     Status: None   Collection Time: 06/20/18  4:55 AM  Result Value Ref Range Status   MRSA by PCR NEGATIVE NEGATIVE Final    Comment:        The GeneXpert MRSA Assay (FDA approved for NASAL specimens only), is one component of a comprehensive MRSA colonization surveillance program. It is not intended to diagnose MRSA  infection nor  to guide or monitor treatment for MRSA infections. Performed at [facility], [place] [address] [place], [place] [address]      Time coordinating discharge: 40 minutes  SIGNED:   Charlynne Cousins, MD  [facility] 06/21/2018, 8:32 AM Pager   If 7PM-7AM, please contact night-coverage [email or web address] Password [email or web address]

## 2018-06-21 NOTE — Progress Notes (Signed)
Clinical Social Worker facilitated patient discharge including contacting patient family and facility to confirm patient discharge plans.  Clinical information faxed to facility and family agreeable with plan.  CSW arranged ambulance transport via [facility] to [facility] .  RN to call for report prior to discharge [phone].  Clinical Social Worker will sign off for now as social work intervention is no longer needed. Please consult [place] again if new need arises.  [employer] [phone]

## 2018-06-21 NOTE — Progress Notes (Signed)
Hand off report given to staff Shirlean Mylar) at [facility].

## 2018-06-24 LAB — CULTURE, BLOOD (ROUTINE X 2)
CULTURE: NO GROWTH
CULTURE: NO GROWTH
Special Requests: ADEQUATE
Special Requests: ADEQUATE

## 2018-06-24 DEATH — deceased

## 2018-07-07 ENCOUNTER — Ambulatory Visit (HOSPITAL_COMMUNITY): Payer: Medicare Other | Admitting: Hematology

## 2018-07-07 ENCOUNTER — Other Ambulatory Visit (HOSPITAL_COMMUNITY): Payer: Medicare Other

## 2018-07-07 ENCOUNTER — Ambulatory Visit (HOSPITAL_COMMUNITY): Payer: Medicare Other

## 2018-07-17 ENCOUNTER — Ambulatory Visit: Payer: Medicare Other | Admitting: Cardiovascular Disease

## 2018-08-01 ENCOUNTER — Ambulatory Visit: Payer: Medicare Other | Admitting: Podiatry

## 2019-02-04 ENCOUNTER — Telehealth: Payer: Self-pay | Admitting: Physician Assistant

## 2019-02-04 ENCOUNTER — Telehealth: Payer: Self-pay | Admitting: Cardiovascular Disease

## 2019-02-04 NOTE — Telephone Encounter (Signed)
LVM for patient to call and schedule 6 month followup.

## 2019-02-04 NOTE — Telephone Encounter (Signed)
New Message     Pt passed away 07/03/2018

## 2019-02-05 NOTE — Telephone Encounter (Signed)
Need to send a condolence note next time we are in the office as DOD

## 2019-03-13 NOTE — Telephone Encounter (Signed)
Condolence letter mailed to pt address 6/17
